# Patient Record
Sex: Female | Born: 1965
Health system: Southern US, Community
[De-identification: ages and names within clinical notes are randomized; demographics above are authoritative.]

## PROBLEM LIST (undated history)

## (undated) ENCOUNTER — Ambulatory Visit: Discharge status: Absent without leave | Payer: PPO

## (undated) DIAGNOSIS — R209 Unspecified disturbances of skin sensation: Secondary | ICD-10-CM

## (undated) DIAGNOSIS — D649 Anemia, unspecified: Secondary | ICD-10-CM

## (undated) DIAGNOSIS — G35 Multiple sclerosis: Secondary | ICD-10-CM

## (undated) DIAGNOSIS — K219 Gastro-esophageal reflux disease without esophagitis: Secondary | ICD-10-CM

## (undated) DIAGNOSIS — R569 Unspecified convulsions: Secondary | ICD-10-CM

## (undated) DIAGNOSIS — I639 Cerebral infarction, unspecified: Secondary | ICD-10-CM

## (undated) DIAGNOSIS — R51 Headache: Secondary | ICD-10-CM

## (undated) DIAGNOSIS — G35D Multiple sclerosis, unspecified: Secondary | ICD-10-CM

## (undated) DIAGNOSIS — I10 Essential (primary) hypertension: Secondary | ICD-10-CM

## (undated) HISTORY — DX: Cerebral infarction, unspecified: I63.9

## (undated) HISTORY — DX: Essential (primary) hypertension: I10

## (undated) HISTORY — DX: Unspecified disturbances of skin sensation: R20.9

## (undated) HISTORY — PX: TONSILLECTOMY: SHX5217

## (undated) HISTORY — PX: OTHER SURGICAL HISTORY: SHX169

## (undated) HISTORY — PX: EYE SURGERY: SHX253

## (undated) HISTORY — DX: Multiple sclerosis: G35

## (undated) HISTORY — DX: Unspecified convulsions: R56.9

## (undated) HISTORY — PX: BREAST BIOPSY: SHX20

## (undated) HISTORY — PX: ANAL INTRAEPITHELIAL NEOPLASIA EXCISION: SHX5241

## (undated) HISTORY — DX: Headache: R51

## (undated) HISTORY — DX: Multiple sclerosis, unspecified: G35.D

## (undated) HISTORY — DX: Gastro-esophageal reflux disease without esophagitis: K21.9

---

## 1999-09-01 ENCOUNTER — Encounter: Payer: Self-pay | Admitting: Emergency Medicine

## 1999-09-01 ENCOUNTER — Emergency Department (HOSPITAL_COMMUNITY): Admission: EM | Admit: 1999-09-01 | Discharge: 1999-09-01 | Payer: Self-pay | Admitting: Emergency Medicine

## 2000-10-24 ENCOUNTER — Emergency Department (HOSPITAL_COMMUNITY): Admission: EM | Admit: 2000-10-24 | Discharge: 2000-10-24 | Payer: Self-pay | Admitting: Emergency Medicine

## 2000-11-05 ENCOUNTER — Encounter (INDEPENDENT_AMBULATORY_CARE_PROVIDER_SITE_OTHER): Payer: Self-pay

## 2000-11-05 ENCOUNTER — Inpatient Hospital Stay (HOSPITAL_COMMUNITY): Admission: RE | Admit: 2000-11-05 | Discharge: 2000-11-07 | Payer: Self-pay | Admitting: Obstetrics & Gynecology

## 2002-03-13 ENCOUNTER — Other Ambulatory Visit: Admission: RE | Admit: 2002-03-13 | Discharge: 2002-03-13 | Payer: Self-pay | Admitting: Obstetrics & Gynecology

## 2002-07-18 ENCOUNTER — Observation Stay (HOSPITAL_COMMUNITY): Admission: AD | Admit: 2002-07-18 | Discharge: 2002-07-19 | Payer: Self-pay | Admitting: Obstetrics and Gynecology

## 2003-08-08 ENCOUNTER — Other Ambulatory Visit: Admission: RE | Admit: 2003-08-08 | Discharge: 2003-08-08 | Payer: Self-pay | Admitting: Obstetrics & Gynecology

## 2004-05-20 ENCOUNTER — Ambulatory Visit: Payer: Self-pay | Admitting: Internal Medicine

## 2004-10-15 ENCOUNTER — Other Ambulatory Visit: Admission: RE | Admit: 2004-10-15 | Discharge: 2004-10-15 | Payer: Self-pay | Admitting: Obstetrics & Gynecology

## 2006-06-21 ENCOUNTER — Ambulatory Visit: Payer: Self-pay | Admitting: Internal Medicine

## 2006-09-24 ENCOUNTER — Ambulatory Visit: Payer: Self-pay | Admitting: Internal Medicine

## 2006-09-24 LAB — CONVERTED CEMR LAB
AST: 17 units/L (ref 0–37)
Alkaline Phosphatase: 68 units/L (ref 39–117)
Bilirubin, Direct: 0.1 mg/dL (ref 0.0–0.3)
Eosinophils Absolute: 0.2 10*3/uL (ref 0.0–0.7)
Glucose, Bld: 94 mg/dL (ref 70–99)
Indirect Bilirubin: 0.3 mg/dL (ref 0.0–0.9)
Lymphs Abs: 2.6 10*3/uL (ref 0.7–3.3)
MCV: 78.7 fL (ref 78.0–100.0)
Neutrophils Relative %: 57 % (ref 43–77)
Platelets: 252 10*3/uL (ref 150–400)
Potassium: 4.1 meq/L (ref 3.5–5.3)
Sodium: 139 meq/L (ref 135–145)
Total Bilirubin: 0.4 mg/dL (ref 0.3–1.2)
WBC: 7.8 10*3/uL (ref 4.0–10.5)

## 2006-10-26 ENCOUNTER — Ambulatory Visit: Payer: Self-pay | Admitting: Internal Medicine

## 2006-12-29 ENCOUNTER — Telehealth (INDEPENDENT_AMBULATORY_CARE_PROVIDER_SITE_OTHER): Payer: Self-pay | Admitting: *Deleted

## 2006-12-30 ENCOUNTER — Ambulatory Visit: Payer: Self-pay | Admitting: Internal Medicine

## 2006-12-30 ENCOUNTER — Ambulatory Visit (HOSPITAL_COMMUNITY): Admission: RE | Admit: 2006-12-30 | Discharge: 2006-12-30 | Payer: Self-pay | Admitting: Internal Medicine

## 2006-12-30 DIAGNOSIS — R209 Unspecified disturbances of skin sensation: Secondary | ICD-10-CM

## 2006-12-30 DIAGNOSIS — K219 Gastro-esophageal reflux disease without esophagitis: Secondary | ICD-10-CM

## 2006-12-30 HISTORY — DX: Unspecified disturbances of skin sensation: R20.9

## 2006-12-30 HISTORY — DX: Gastro-esophageal reflux disease without esophagitis: K21.9

## 2006-12-31 ENCOUNTER — Inpatient Hospital Stay (HOSPITAL_COMMUNITY): Admission: EM | Admit: 2006-12-31 | Discharge: 2007-01-03 | Payer: Self-pay | Admitting: Emergency Medicine

## 2007-01-03 ENCOUNTER — Ambulatory Visit: Payer: Self-pay | Admitting: Internal Medicine

## 2007-01-07 ENCOUNTER — Encounter: Payer: Self-pay | Admitting: Internal Medicine

## 2007-01-07 ENCOUNTER — Telehealth (INDEPENDENT_AMBULATORY_CARE_PROVIDER_SITE_OTHER): Payer: Self-pay | Admitting: *Deleted

## 2007-01-12 ENCOUNTER — Telehealth (INDEPENDENT_AMBULATORY_CARE_PROVIDER_SITE_OTHER): Payer: Self-pay | Admitting: *Deleted

## 2007-02-14 ENCOUNTER — Ambulatory Visit: Payer: Self-pay | Admitting: Internal Medicine

## 2007-06-06 ENCOUNTER — Telehealth (INDEPENDENT_AMBULATORY_CARE_PROVIDER_SITE_OTHER): Payer: Self-pay | Admitting: *Deleted

## 2007-07-29 ENCOUNTER — Telehealth: Payer: Self-pay | Admitting: Internal Medicine

## 2007-10-13 ENCOUNTER — Encounter: Payer: Self-pay | Admitting: Internal Medicine

## 2007-11-28 ENCOUNTER — Encounter: Payer: Self-pay | Admitting: Internal Medicine

## 2008-09-13 ENCOUNTER — Ambulatory Visit: Payer: Self-pay | Admitting: Internal Medicine

## 2008-09-14 ENCOUNTER — Ambulatory Visit: Payer: Self-pay | Admitting: Family Medicine

## 2008-09-25 ENCOUNTER — Ambulatory Visit: Payer: Self-pay | Admitting: Internal Medicine

## 2008-11-02 ENCOUNTER — Encounter: Admission: RE | Admit: 2008-11-02 | Discharge: 2008-11-02 | Payer: Self-pay | Admitting: Obstetrics & Gynecology

## 2009-05-03 ENCOUNTER — Encounter: Admission: RE | Admit: 2009-05-03 | Discharge: 2009-05-03 | Payer: Self-pay | Admitting: Obstetrics & Gynecology

## 2009-09-14 ENCOUNTER — Emergency Department (HOSPITAL_COMMUNITY): Admission: EM | Admit: 2009-09-14 | Discharge: 2009-09-15 | Payer: Self-pay

## 2009-11-08 ENCOUNTER — Encounter: Admission: RE | Admit: 2009-11-08 | Discharge: 2009-11-08 | Payer: Self-pay | Admitting: Obstetrics & Gynecology

## 2009-11-13 ENCOUNTER — Encounter: Admission: RE | Admit: 2009-11-13 | Discharge: 2009-11-13 | Payer: Self-pay | Admitting: Obstetrics & Gynecology

## 2010-02-14 ENCOUNTER — Ambulatory Visit: Payer: Self-pay | Admitting: Internal Medicine

## 2010-06-08 ENCOUNTER — Encounter: Payer: Self-pay | Admitting: Internal Medicine

## 2010-06-17 NOTE — Assessment & Plan Note (Signed)
Summary: acid reflux//ccm   Vital Signs:  Patient profile:   45 year old female Weight:      229 pounds Temp:     98.3 degrees F oral BP sitting:   130 / 80  (right arm) Cuff size:   regular  Vitals Entered By: Duard Brady LPN (February 14, 2010 11:27 AM) CC: c/o gerd sx getting worse       **declines flu vaccine Is Patient Diabetic? No   CC:  c/o gerd sx getting worse       **declines flu vaccine.  History of Present Illness:  45 year old patient who has a history of MS.  She also has a history of gastroesophageal reflux disease and for the past two months has had worsening nocturnal reflux symptoms.  She states that the symptoms have been well controlled in the past with Nexium. She has a history of MS, which has been fairly stable.  She remains on weekly interferon injections  Allergies: 1)  ! Sulf-10 2)  ! Penicillin G Potassium (Penicillin G Potassium)  Past History:  Past Medical History: Reviewed history from 09/13/2008 and no changes required. GERD multiple sclerosis  Review of Systems       The patient complains of severe indigestion/heartburn.  The patient denies anorexia, fever, weight loss, weight gain, vision loss, decreased hearing, hoarseness, chest pain, syncope, dyspnea on exertion, peripheral edema, prolonged cough, headaches, hemoptysis, abdominal pain, melena, hematochezia, hematuria, incontinence, genital sores, muscle weakness, suspicious skin lesions, transient blindness, difficulty walking, depression, unusual weight change, abnormal bleeding, enlarged lymph nodes, angioedema, and breast masses.    Physical Exam  General:  overweight-appearing.  normal blood pressureoverweight-appearing.   Head:  Normocephalic and atraumatic without obvious abnormalities. No apparent alopecia or balding. Eyes:  No corneal or conjunctival inflammation noted. EOMI. Perrla. Funduscopic exam benign, without hemorrhages, exudates or papilledema. Vision grossly  normal. Mouth:  Oral mucosa and oropharynx without lesions or exudates.  Teeth in good repair. Neck:  No deformities, masses, or tenderness noted. Lungs:  Normal respiratory effort, chest expands symmetrically. Lungs are clear to auscultation, no crackles or wheezes. Heart:  Normal rate and regular rhythm. S1 and S2 normal without gallop, murmur, click, rub or other extra sounds. Abdomen:  Bowel sounds positive,abdomen soft and non-tender without masses, organomegaly or hernias noted.   Impression & Recommendations:  Problem # 1:  GERD (ICD-530.81)  Her updated medication list for this problem includes:    Nexium 40 Mg Cpdr (Esomeprazole magnesium) ..... One daily  Her updated medication list for this problem includes:    Nexium 40 Mg Cpdr (Esomeprazole magnesium) ..... One daily  Complete Medication List: 1)  Miralax Powd (Polyethylene glycol 3350) .... Two times a day until results then daily prn 2)  Avonex 30 Mcg/vial Kit (Interferon beta-1a) .Marland Kitchen.. 1 q week 3)  Nexium 40 Mg Cpdr (Esomeprazole magnesium) .... One daily  Patient Instructions: 1)  Limit your Sodium (Salt). 2)  Avoid foods high in acid (tomatoes, citrus juices, spicy foods). Avoid eating within two hours of lying down or before exercising. Do not over eat; try smaller more frequent meals. Elevate head of bed twelve inches when sleeping. 3)  It is important that you exercise regularly at least 20 minutes 5 times a week. If you develop chest pain, have severe difficulty breathing, or feel very tired , stop exercising immediately and seek medical attention. 4)  You need to lose weight. Consider a lower calorie diet and regular exercise.  Prescriptions: NEXIUM 40  MG CPDR (ESOMEPRAZOLE MAGNESIUM) one daily  #90 x 3   Entered and Authorized by:   Gordy Savers  MD   Signed by:   Gordy Savers  MD on 02/14/2010   Method used:   Print then Give to Patient   RxID:   [id number]

## 2010-08-11 ENCOUNTER — Encounter: Payer: Self-pay | Admitting: Internal Medicine

## 2010-08-12 ENCOUNTER — Ambulatory Visit (INDEPENDENT_AMBULATORY_CARE_PROVIDER_SITE_OTHER): Payer: Managed Care, Other (non HMO) | Admitting: Internal Medicine

## 2010-08-12 ENCOUNTER — Encounter: Payer: Self-pay | Admitting: Internal Medicine

## 2010-08-12 VITALS — BP 110/88 | Temp 98.7°F | Wt 234.0 lb

## 2010-08-12 DIAGNOSIS — G35 Multiple sclerosis: Secondary | ICD-10-CM | POA: Insufficient documentation

## 2010-08-12 LAB — HEPATIC FUNCTION PANEL
ALT: 19 U/L (ref 0–35)
Albumin: 3.3 g/dL — ABNORMAL LOW (ref 3.5–5.2)
Total Bilirubin: 0.2 mg/dL — ABNORMAL LOW (ref 0.3–1.2)

## 2010-08-12 LAB — CBC WITH DIFFERENTIAL/PLATELET
Eosinophils Absolute: 0.1 10*3/uL (ref 0.0–0.7)
MCHC: 32.8 g/dL (ref 30.0–36.0)
MCV: 80 fl (ref 78.0–100.0)
Monocytes Absolute: 0.8 10*3/uL (ref 0.1–1.0)
Neutrophils Relative %: 66.3 % (ref 43.0–77.0)
Platelets: 261 10*3/uL (ref 150.0–400.0)

## 2010-08-12 LAB — BASIC METABOLIC PANEL
BUN: 14 mg/dL (ref 6–23)
CO2: 29 mEq/L (ref 19–32)
Chloride: 106 mEq/L (ref 96–112)
Creatinine, Ser: 1 mg/dL (ref 0.4–1.2)
Glucose, Bld: 73 mg/dL (ref 70–99)

## 2010-08-12 LAB — TSH: TSH: 2.84 u[IU]/mL (ref 0.35–5.50)

## 2010-08-12 NOTE — Patient Instructions (Signed)
Limit your sodium (Salt) intake    It is important that you exercise regularly, at least 20 minutes 3 to 4 times per week.  If you develop chest pain or shortness of breath seek  medical attention.  You need to lose weight.  Consider a lower calorie diet and regular exercise.  Avoids foods high in acid such as tomatoes citrus juices, and spicy foods.  Avoid eating within two hours of lying down or before exercising.  Do not overheat.  Try smaller more frequent meals.  If symptoms persist, elevate the head of her bed 12 inches while sleeping.  Call or return to clinic prn if these symptoms worsen or fail to improve as anticipated.   followup neurology

## 2010-08-12 NOTE — Progress Notes (Signed)
  Subjective:    Patient ID: [name], female    DOB: 02/18/1966, 45 y.o.   MRN: [id number]  HPI   45 year old patient who is followed closely by neurology due to MAS. This was initially diagnosed in August of 2008. She has multiple complaints today including fatigue weight gain unsteady gait and some numbness of her lower extremities. Does also have some mild low back pain and worsening GERD symptoms. She is on chronic Nexium. She is on interferon and more recently has been on Solu-Medrol. She was evaluated by gynecology recently but no laboratory studies were performed.    Review of Systems  Constitutional: Positive for unexpected weight change.  HENT: Negative for hearing loss, congestion, sore throat, rhinorrhea, dental problem, sinus pressure and tinnitus.   Eyes: Negative for pain, discharge and visual disturbance.  Respiratory: Negative for cough and shortness of breath.   Cardiovascular: Positive for palpitations. Negative for chest pain and leg swelling.  Gastrointestinal: Negative for nausea, vomiting, abdominal pain, diarrhea, constipation, blood in stool and abdominal distention.  Genitourinary: Negative for dysuria, urgency, frequency, hematuria, flank pain, vaginal bleeding, vaginal discharge, difficulty urinating, vaginal pain and pelvic pain.  Musculoskeletal: Positive for back pain. Negative for joint swelling, arthralgias and gait problem.  Skin: Negative for rash.  Neurological: Positive for weakness, light-headedness and numbness. Negative for dizziness, syncope, speech difficulty and headaches.  Hematological: Negative for adenopathy.  Psychiatric/Behavioral: Negative for behavioral problems, dysphoric mood and agitation. The patient is not nervous/anxious.        Objective:   Physical Exam        Assessment & Plan:   multiple sclerosis. Continue present regimen and follow up with neurology  Weight gain fatigue. May be manifestations of her MS and/or  treatment. We'll check some screening labs

## 2010-08-25 ENCOUNTER — Telehealth: Payer: Self-pay | Admitting: Internal Medicine

## 2010-08-25 NOTE — Telephone Encounter (Signed)
Please advise 

## 2010-08-25 NOTE — Telephone Encounter (Signed)
Pt need labwork results

## 2010-08-25 NOTE — Telephone Encounter (Signed)
Lab ok except for mild stable anemia

## 2010-08-25 NOTE — Telephone Encounter (Signed)
Spoke with pt - informed of labs. KIK

## 2010-08-26 ENCOUNTER — Encounter: Payer: Self-pay | Admitting: Internal Medicine

## 2010-08-26 ENCOUNTER — Ambulatory Visit (INDEPENDENT_AMBULATORY_CARE_PROVIDER_SITE_OTHER): Payer: Managed Care, Other (non HMO) | Admitting: Internal Medicine

## 2010-08-26 VITALS — BP 110/70 | Temp 98.9°F | Wt 226.0 lb

## 2010-08-26 DIAGNOSIS — L299 Pruritus, unspecified: Secondary | ICD-10-CM

## 2010-08-26 MED ORDER — HYDROXYZINE HCL 10 MG PO TABS: 10.0000 mg | ORAL_TABLET | Freq: Three times a day (TID) | ORAL | Status: AC | PRN

## 2010-08-26 MED ORDER — PREDNISONE 10 MG PO TABS: 10.0000 mg | ORAL_TABLET | Freq: Every day | ORAL | Status: AC

## 2010-08-26 NOTE — Patient Instructions (Signed)
Call or return to clinic prn if these symptoms worsen or fail to improve as anticipated.

## 2010-08-26 NOTE — Progress Notes (Signed)
  Subjective:    Patient ID: [name], female    DOB: 21-Aug-1965, 45 y.o.   MRN: [id number]  HPI 45 year old patient who has a history of multiple sclerosis. She complains of generalized pruritus after sending the weekend outdoors in a park. There's been no rash. She has been using Benadryl with only marginal benefit. There does not appear to any new irritants such as change in soap laundry detergent fabric softener etc.   Review of Systems  Skin: Negative for rash.       Objective:   Physical Exam  Constitutional: She appears well-developed and well-nourished. No distress.       Overweight. No distress. Normal blood pressure.  Skin: No rash noted.          Assessment & Plan:  Generalized pruritus. Prominent secondary to exposure to outdoor allergens. We'll treat with Atarax and short course of low-dose steroids

## 2010-09-30 NOTE — Consult Note (Signed)
[name], [name]              ACCOUNT NO.:  [id number]   MEDICAL RECORD NO.:  [id number]          PATIENT TYPE:  INP   LOCATION:  5022                         FACILITY:  [facility]   PHYSICIAN:  Casimiro Needle L. Reynolds, M.D.DATE OF BIRTH:  November 15, 1965   DATE OF CONSULTATION:  12/31/2006  DATE OF DISCHARGE:                                 CONSULTATION   INPATIENT CONSULTATION:   REQUESTING PHYSICIAN:  Eleonore Chiquito, MD.   REASON FOR EVALUATION:  Lower extremity numbness, suspected myelitis.   HPI:  This is the initial inpatient consultation evaluation of this 45-  year-old woman with little significant past medical history except as  outlined below.  The patient reports that 4 days ago she noticed  numbness in her left foot.  Over the next couple of days, this gradually  ascended up her leg, and she also noticed some numbness in the right  foot and right leg as well.  Two days ago she noticed the numbness had  come all the way up into her stomach area, also a little bit involving  the ulnar aspects of both hands.  There was no associated weakness,  alteration in gait, or difficulty with bowel or bladder function.  She  was actually able to continue working during these few days.  Yesterday  she saw her primary physician who ordered an MRI of the cervical spine  which was done last night.  After an abnormality was seen there, she was  advised to come to the emergency room for admission and has been  admitted to the [facility] inpatient service with neurologic consultation.  At present, she is awake and alert.  She is anxious but denies any pain.  She states that other than this she is feeling well and denies ever  having anything like this happen to her before.   PAST MEDICAL HISTORY:  She has a questionable history of hypertension  for which she is on lisinopril.  She had HELLP syndrome during  pregnancy.  She states that approximately 17 or 18 years ago she had an  episode where she  developed difficulty walking which lasted a few days.  She was seen at an urgent care, and the concern was that she might have  had a stroke.  However, she did not come to the hospital.  She states  that she walked with a limp for a few days, and eventually this  rectified itself and she had no further difficulties.  She did not have  a workup for this.  She states that about 6 to 7 years ago while on a  trip to [place] she had some twitching of my body, which was transient  and resolved.  It was not associated with focal neurologic deficits.  She denies ever having any transient monocular visual loss.   PREVIOUS SURGERIES:  Include C-section, myomectomy.   FAMILY/SOCIAL/REVIEW OF SYSTEMS:  As outlined in admission H&P which is  reviewed.  She denies any history of head injuries, history of tobacco  or alchohol use.  She does report a longstanding problem with  constipation and occasional urinary hesitation  but nothing very severe.   MEDICATIONS:  She uses Nexium p.r.n.  In the hospital, she is also  receiving lisinopril, Lovenox, and Tylenol p.r.n.   PHYSICAL EXAMINATION:  VITAL SIGNS:  Temperature 99.9, blood pressure  146/93, pulse 111, respirations 20, O2 sat 100% on room air.  GENERAL EXAMINATION:  This is an anxious-appearing but otherwise healthy  woman seated and no evident distress.  HEENT:  Head:  Cranium is normocephalic and atraumatic.  Oropharynx  benign.  NECK:  Supple without carotid or supraclavicular bruits.  She does not  have a true Lhermitte's sign, but does report a numbness sensation in  the feet with neck flexion.  HEART:  Tachycardiac, regular rhythm, no murmurs.  NEUROLOGIC EXAMINATION:  Mental status:  She is awake and alert.  She is  fully oriented to time and place.  Recent and remote memory are intact.  Attention span, concentration, fund of knowledge are all appropriate.  Speech is fluent and not dysarthric.  She has no defects to  confrontational naming  and can repeat phrases.  Mood is euthymic and  affect appropriate.  Cranial nerves:  Pupils are equal, brisk, and  reactive.  Extraocular movements full without nystagmus.  Visual fields  full with confrontation.  Hearing intact to finger rub.  Face, tongue,  and palate move normally and symmetric.  Motor:  Normal bulk and tone.  Normal strength in all tested extremity muscles.  Sensation intact on  light touch and proprioception in all extremities.  Coordination:  Rapid  movements are performed accurately.  Finger-to-nose and heel-to-shin are  performed accurately.  Gait:  She arises easily from a chair and her  stance is normal, stable to toe and tandem walk without significant  difficulty.  Reflexes 2+ and symmetric.  Toes are downgoing bilaterally.   LABORATORY REVIEW:  CBC:  White count 9.3, hemoglobin 12.6, platelets  257,000.  BMET is unremarkable.  MRI of the cervical spine performed  last night is personally reviewed.  This study demonstrates swelling and  edema of the spinal cord, particularly in the posterior columns, from C2  to C4.  There is spine enhancement in this area.  The cord is slightly  enlarged.  There is no significant disk disease or compression seen.  The rest of the cord looks unremarkable.  MRI of the brain performed  during this day is reviewed.  A preliminary report is also reviewed.  This study demonstrates 1 white matter lesion in the left parieto-  occipital white matter as well as a diffuse haziness to the white  matter.  No acute findings are demonstrated.   IMPRESSION:  Cervical myelitis.  This is probably secondary to multiple  sclerosis, although other causes should be excluded.  She has,  surprisingly, little on her examination to show for this at this time,  but it should be treated aggressively to make sure it does not worsen.   RECOMMENDATIONS:  We will check labs to exclude entities in the  differential diagnosis which would include lupus,  sarcoid, HIV and HTLV-  1 infection.  Treat with Solu-Medrol 1 g IV daily for the next 3 days.  After that she can go on a prednisone taper.  If lab work is otherwise  negative, she most likely should be treated for multiple sclerosis, but  this treatment can be initiated on an outpatient basis.   We will follow with you.  Thank you for the consultation.      Michael L. Thad Ranger, M.D.  Electronically Signed     MLR/MEDQ  D:  12/31/2006  T:  01/01/2007  Job:  [id number]

## 2010-09-30 NOTE — Discharge Summary (Signed)
[name], [name]              ACCOUNT NO.:  [id number]   MEDICAL RECORD NO.:  [id number]          PATIENT TYPE:  INP   LOCATION:  5022                         FACILITY:  [facility]   PHYSICIAN:  Valerie A. Felicity Coyer, MDDATE OF BIRTH:  Dec 02, 1965   DATE OF ADMISSION:  12/31/2006  DATE OF DISCHARGE:  01/03/2007                               DISCHARGE SUMMARY   DISCHARGE DIAGNOSES:  1. Paresthesias with bilateral leg and trunk numbness, evidence of      transverse myelitis versus multiple sclerosis, improved on steroid      therapy.  See below.  Outpatient followup in 2 weeks.  Continue      p.o. prednisone taper.  2. History of bowel hemolysis, elevated liver enzymes and low platelet      count syndrome during pregnancy.  3. Hypertension.  Continue home lisinopril.   DISCHARGE MEDICATIONS:  1. Prednisone taper 60 mg daily x3 days, then 50 mg daily x3 days,      then 40 mg daily x3 days, then 30 mg daily x3 days, then 20 mg      daily x3 days, then 10 mg daily x3 days, then stop.  2. Lisinopril 10 mg daily.  3. Nexium 40 mg daily p.r.n.   DISPOSITION:  The patient is discharged home in medically stable and  improved condition.  She at this time is having only numbness in her  hands and feet bilaterally which is improved.  She has no motor  deficits, no difficulty with ambulation or balance, and feels stable for  discharge home. Reviewed plans for prednisone taper and outpatient  neurology followup in 2 weeks with the patient and family.   CONSULTATIONS THIS HOSPITALIZATION:  Include neurology, Dr. Kelli Hope. Hospital followup is with neurologist, Dr. Kelli Hope.  Family to call for appointment in 2 weeks, sooner if problems.  Also  primary care physician, Dr. Derryl Harbor in 4-6 weeks or as needed  for otherwise routine followup of other medical issues.   CONDITION ON DISCHARGE:  Medically improved.   HOSPITAL COURSE:  CERVICAL TRANSVERSE MYELITIS VERSUS NEW  DIAGNOSIS MULTIPLE SCLEROSIS:  The patient is a pleasant 45 year old woman who came to the emergency  room due to lower extremity numbness for the last 4 days.  She had been  seen by her primary care physician for evaluation of same who ordered  MRI of the neck for further evaluation. This showed cervical edema  consistent with transverse myelitis verses MS and was contacted by her  primary care physician's office to come to the emergency room for  further evaluation.  She was admitted for neurology consultation who  felt that treatment with high-dose IV steroids as well as evaluation for  myelitis verses MS would be appropriate. Please see initial consultation  by Dr. Kelli Hope as per dictation. The laboratory tests ordered  were checked, and at this time most of the returns have been negative  and include a negative ANA, normal ACE level, nonreactive HIV, sed rate  of only 20, and a normal B12, CBC, and basic metabolic.   At this time, the  patient's symptoms have improved, and she has been  told to expect a waxing and waning course. Other followup on pending  labs will be per neurology, and she is to follow up with them in the  next two weeks. She has been given a prednisone taper as described above  per recommendations of neurology and is anxious for discharge home.   Other problems are as prior to admission, and she is to call neurology  or primary care physician as needed for problems prior to that time.      Valerie A. Felicity Coyer, MD  Electronically Signed     VAL/MEDQ  D:  01/03/2007  T:  01/03/2007  Job:  [id number]

## 2010-10-03 NOTE — H&P (Signed)
[facility]  Patient:    [name], [name]                       MRN: [id number] Adm. Date:  11/05/00 Attending:  Caralyn Guile. Arlyce Dice, M.D.                         History and Physical  CHIEF COMPLAINT:              Myoma uteri.  HISTORY OF PRESENT ILLNESS:   This is a 45 year old, gravida 1, AB 1, who was seen in the office with a history of rapidly enlarging uterine fibroid.  The patient had a physical exam one year ago in which no mention of an enlarged uterus was made.  The patient presented to her family doctor in May and was found to have a large abdominal mass which on ultrasound was shown to be a uterine myoma.  This mass was approximately 20-weeks size.  In view of her negative physical exam a year prior, the assumption is that this is a fairly rapidly growing tumor.  PAST MEDICAL HISTORY:         Negative for surgery.  She has no medical problems.  She has never had a blood transfusion.  She has had all the normal childhood diseases.  ALLERGIES:                    PENICILLIN and SULFA.  FAMILY HISTORY:               Positive for high blood pressure but negative for diabetes, heart attack or breast cancer.  SOCIAL HISTORY:               She does not smoke, does not drink alcohol.  She denies the use of IV drugs or addicting recreational drugs.  REVIEW OF SYSTEMS:            Negative in detail.  PHYSICAL EXAMINATION:  VITAL SIGNS:                  She is afebrile.  Her pulse is 80 and regular. Her blood pressure is 100/80.  HEENT:                        Normal.  NECK:                         Supple without thyromegaly.  BREASTS:                      Without masses.  HEART:                        Regular sinus rhythm without murmur or gallop.  LUNGS:                        Clear.  ABDOMEN:                      Soft.  There was a large pelvic mass reaching to the umbilicus.  EXTREMITIES:                   Normal.  LYMPHADENOPATHY:              None.  PELVIC:  External genitalia and vagina normal.  Cervix normal.  The uterus was quite enlarged with a large posterior myoma noted along with other palpable myomas.  LABORATORY DATA:              Hemoglobin 13.1.  ADMISSION DIAGNOSES:          Large uterine myoma.  PLAN:                         The options were discussed with the patient who is very anxious to preserve her fertility; therefore, a uterine myomectomy will be performed. DD:  11/04/00 TD:  11/04/00 Job: [id number]

## 2010-10-03 NOTE — Discharge Summary (Signed)
[facility]  Patient:    [name], [name]                     MRN: [id number] Adm. Date:  [id number] Disc. Date: [id number] Attending:  Lars Pinks                           Discharge Summary  FINAL DIAGNOSIS:              Myoma uteri.  SECONDARY DIAGNOSIS:          None.  PROCEDURE:                    Abdominal myomectomy.  COMPLICATIONS:                None.  CONDITION ON DISCHARGE:       Improved.  HISTORY:                      This is a 45 year old gravida 1, Ab 1, who was presented to the office with a history of rapidly enlarging uterine myoma. The patient had a physical exam one year prior to admission where no mention of myoma were made. The size of the myoma was approximately the size of a 20-week uterus, and the patient was counseled to have myomectomy. The patient was brought to the hospital on June 21 where an abdominal myomectomy was performed by Dr. Ilda Mori without complication. The patients postoperative course was benign without significant fever or anemia. On the second postoperative day, the patient was felt to be ready for discharge. She was discharged on a regular diet, told to limit her activity.  DISCHARGE MEDICATIONS:        1. Percocet one to two every four hours for                                  pain.                               2. Ibuprofen 600 mg q.i.d. for pain.                               3. Iron sulfate.  DISCHARGE FOLLOWUP:           The patient was asked to call Dr. Arlyce Dice for followup evaluation in four weeks.  LABORATORY DATA:              Admission hemoglobin was 13.1 with a white count of 5600. On discharge, her hemoglobin was 9.6 with a white count of 6800. Routine chemistries were essentially within normal limits. Her total protein was slightly elevated at 8.8, her potassium was slightly decreased at 3.3, and her sodium was slightly increased at 146. Her ALT and bilirubin were  normal.  PATHOLOGY REPORT:             Four nodular masses ranging from 1 cm to 12 cm in size with a combined weight of 720 g. The masses were all read as benign leiomyomata.  DD:  12/13/00 TD:  12/13/00 Job: [id number]

## 2010-10-03 NOTE — Op Note (Signed)
[facility]  Patient:    [name], [name]                     MRN: [id number] Proc. Date: 11/05/00 Adm. Date:  [id number] Attending:  Lars Pinks CC:         Gordy Savers, M.D.   Operative Report  PREOPERATIVE DIAGNOSIS:       Rapidly growing uterine myoma.  POSTOPERATIVE DIAGNOSIS:      Rapidly growing uterine myoma.  PROCEDURE:                    Abdominal myomectomy.  SURGEON:                      Richard D. Arlyce Dice, M.D.  ASSISTANT:                    Marcelle Overlie, M.D.  ANESTHESIA:                   General endotracheal.  ESTIMATED BLOOD LOSS:         200 cc.  FINDINGS:                     Four uterine myoma, one was around 7-8 cm on the fundus, another was 7-8 cm in the lower uterine segment near the cervix and posterior, a third was only 2 cm in the fundus, and the fourth was a small ______ myoma near the left cornua. The tubes and ovaries were perfectly normal. The rest of the abdominal cavity was normal.  INDICATIONS:                  This was a 45 year old, gravida 1, Ab 1, who was found to have a large uterine myoma on physical exam and this was confirmed with ultrasound. Previously, the patient had never been told she had myomas and her previous pelvic exam was less than a year prior to the discovery of her present myoma. The patient was very concerned about preserving her fertility and a decision was made to proceed with myomectomy.  DESCRIPTION OF PROCEDURE:            The patient was taken to the operating room and placed in the supine position and general endotracheal anesthesia was induced. A pediatric Foley was introduced through the cervix into the endometrial cavity and methylene blue dye was then attached for future instillation, identification of the endometrial cavity, and the patency of the fallopian tubes. The surgeon then regowned and gloved. The abdomen and perineum had been draped in sterile  fashion. A low transverse incision was made and carried down to the fascia which was extended transversely. The rectus muscle was divided in the midline and the peritoneum was entered sharply and extended vertically. The OConnor-OSullivan self-retaining retractor was placed. The fibroid uterus was then delivered through the incision. Pitressin 10 units/10 cc was then injected into the fundal myoma and the myoma was then dissected out by sharp and blunt dissection. The base of the myoma was closed with interlocking 3-0 Vicryl suture and the serosal capsule was closed with a Connell stitch to minimize adhesions. The identical procedure was then performed with a large posterior myoma, and this was also dissected free by sharp and blunt dissection and the base was closed with 3-0 Vicryl and the serosa was closed with a Connell stitch. The 2 cm myoma was dissected through  the same incision as the posterior myoma and the small ______  myoma was cauterized and removed in that fashion. At the end of the procedure, the uterus was back to normal size and the tubes and ovaries appeared perfectly normal. Methylene blue dye was then instilled through the indwelling pediatric Foley and both tubes spilled promptly with dye. During the dissection, the endometrial cavity was not approached and was felt that she was an excellent candidate for vaginal delivery. To minimize the risk of adhesion formation, Interceed gel was then placed over the uterine incisions where the myoma had been removed. At this point, the peritoneal cavity was closed with a running 0 Vicryl suture, the fascia was closed with running 0 Vicryl suture, and the skin was closed with subcuticular 4-0 Monocryl suture. The patient tolerated the procedure well and left the operating room in good condition. DD:  11/05/00 TD:  11/05/00 Job: [id number]

## 2011-02-27 LAB — HIV ANTIBODY (ROUTINE TESTING W REFLEX): HIV: NONREACTIVE

## 2011-02-27 LAB — BASIC METABOLIC PANEL
CO2: 21
Calcium: 8.8
Chloride: 108
GFR calc Af Amer: >60
Sodium: 137

## 2011-02-27 LAB — CBC
Hemoglobin: 12.6
MCHC: 32.3
RBC: 4.77
WBC: 9.3

## 2011-02-27 LAB — DIFFERENTIAL
Basophils Relative: 1
Lymphs Abs: 1.1
Monocytes Absolute: 0.4
Monocytes Relative: 4
Neutro Abs: 7.7

## 2011-02-27 LAB — VITAMIN B12: Vitamin B-12: 618 (ref 211–911)

## 2011-02-27 LAB — SEDIMENTATION RATE: Sed Rate: 20

## 2011-02-27 LAB — HTLV I+II ANTIBODIES, (EIA), BLD: HTLV I/II Ab: NEGATIVE

## 2011-02-27 LAB — ANA: Anti Nuclear Antibody(ANA): NEGATIVE

## 2011-06-19 ENCOUNTER — Other Ambulatory Visit: Payer: Self-pay | Admitting: Obstetrics & Gynecology

## 2011-06-24 ENCOUNTER — Other Ambulatory Visit: Payer: Self-pay | Admitting: Obstetrics & Gynecology

## 2011-06-24 DIAGNOSIS — N644 Mastodynia: Secondary | ICD-10-CM

## 2011-09-23 ENCOUNTER — Encounter (HOSPITAL_BASED_OUTPATIENT_CLINIC_OR_DEPARTMENT_OTHER): Payer: Self-pay | Admitting: *Deleted

## 2011-09-23 ENCOUNTER — Emergency Department (HOSPITAL_BASED_OUTPATIENT_CLINIC_OR_DEPARTMENT_OTHER)
Admission: EM | Admit: 2011-09-23 | Discharge: 2011-09-23 | Disposition: A | Discharge status: Home / Self Care | Payer: Managed Care, Other (non HMO) | Attending: Emergency Medicine | Admitting: Emergency Medicine

## 2011-09-23 DIAGNOSIS — Z79899 Other long term (current) drug therapy: Secondary | ICD-10-CM | POA: Insufficient documentation

## 2011-09-23 DIAGNOSIS — K219 Gastro-esophageal reflux disease without esophagitis: Secondary | ICD-10-CM | POA: Insufficient documentation

## 2011-09-23 DIAGNOSIS — G35 Multiple sclerosis: Secondary | ICD-10-CM | POA: Insufficient documentation

## 2011-09-23 DIAGNOSIS — R55 Syncope and collapse: Secondary | ICD-10-CM | POA: Insufficient documentation

## 2011-09-23 DIAGNOSIS — R42 Dizziness and giddiness: Secondary | ICD-10-CM | POA: Insufficient documentation

## 2011-09-23 HISTORY — DX: Anemia, unspecified: D64.9

## 2011-09-23 LAB — CBC
Hemoglobin: 10.1 g/dL — ABNORMAL LOW (ref 12.0–15.0)
MCH: 22.1 pg — ABNORMAL LOW (ref 26.0–34.0)
MCHC: 32.6 g/dL (ref 30.0–36.0)
Platelets: 303 10*3/uL (ref 150–400)
RDW: 18.7 % — ABNORMAL HIGH (ref 11.5–15.5)

## 2011-09-23 LAB — DIFFERENTIAL
Basophils Absolute: 0 10*3/uL (ref 0.0–0.1)
Eosinophils Absolute: 0.1 10*3/uL (ref 0.0–0.7)
Lymphocytes Relative: 31 % (ref 12–46)
Monocytes Absolute: 0.4 10*3/uL (ref 0.1–1.0)
Neutrophils Relative %: 60 % (ref 43–77)

## 2011-09-23 LAB — BASIC METABOLIC PANEL
BUN: 9 mg/dL (ref 6–23)
Creatinine, Ser: 0.7 mg/dL (ref 0.50–1.10)
GFR calc Af Amer: >90 mL/min (ref >90)
GFR calc non Af Amer: >90 mL/min (ref >90)
Glucose, Bld: 92 mg/dL (ref 70–99)

## 2011-09-23 MED ORDER — ONDANSETRON 4 MG PO TBDP: 4.0000 mg | ORAL_TABLET | Freq: Three times a day (TID) | ORAL | Status: AC | PRN

## 2011-09-23 MED ORDER — ONDANSETRON HCL 4 MG/2ML IJ SOLN
4.0000 mg | Freq: Once | INTRAMUSCULAR | Status: AC
  Administered 2011-09-23: 4 mg via INTRAVENOUS
  Filled 2011-09-23: qty 2

## 2011-09-23 NOTE — Discharge Instructions (Signed)
Multiple Sclerosis Multiple sclerosis (MS) is a disease of the central nervous system. Its cause is unknown. It is more common in the [place] states than in the [place] states. There is a higher incidence of MS in women. There is a wide variation in the symptoms (problems) of MS. This is because of the many different ways it affects the central nervous system. It often comes on in episodes or attacks. These attacks may last weeks to months. There may be long periods of nearly no problems between attacks. The main symptoms include visual problems (associated with eye pain), numbness, weakness, and paralysis in extremities (arms/hands and legs/feet). There may also be tremors and problems with balance and walking. The age when MS starts is variable. Advances in medicine continue to improve the treatment of this illness. There is no known cure for MS but there are medications that help. MS is not an inherited illness, although your risk of getting this disease is higher if you have a relative with MS. The best radiologic (x-ray) study for MS is an MRI (magnetic resonance imaging). There are medications available to decrease the number and frequency of attacks. SYMPTOMS  The symptoms of MS are caused by loss of insulation (myelin) of the nerves of the brain. When this happens, brain signals do not get transmitted properly or may not get transmitted at all. Some of the problems caused by this include:   Numbness.   Weakness.   Paralysis in extremities.   Visual problems, eye pain.   Balance problems.   Tremors.  DIAGNOSIS  Your caregiver can do studies on you to make this diagnosis. This may include specialized X-rays and spinal fluid studies. HOME CARE INSTRUCTIONS   Take medications as directed by your caregiver. Baclofen is a drug commonly used to reduce muscle spasticity. Steroids are often used for short term relief.   Exercise as directed.   Use physical and occupational therapy as  directed by your caregiver. Careful attention to this medical care can help avoid depression.   See your caregiver if you begin to have problems with depression. This is a common problem in MS. Patients often continue to work many years after the diagnosis of MS.  Document Released: 05/01/2000 Document Revised: 04/23/2011 Document Reviewed: 12/08/2006 [facility] Patient Information 2012 [place], [place].Near-Syncope Near-syncope is sudden weakness, dizziness, or feeling like you might pass out (faint). This can happen when getting up or while standing for a long time. It can be caused by a drop in blood pressure. It is common in people taking medicine for blood pressure. Fainting can happen when the blood pressure or pulse is too low. HOME CARE  If you feel like you are going to pass out:   Lie down right away.   Breathe deeply and steadily.   Move only when the feeling has gone away. Most of the time, this feeling lasts only a few minutes. You may feel tired for several hours.   Drink enough fluids to keep your pee (urine) clear or pale yellow.   If you are taking blood pressure or heart medicine, stand up slowly.  GET HELP RIGHT AWAY IF:   You have a severe headache.   Unusual pain develops in the chest, belly (abdomen), or back.   You have bleeding from the mouth or butt (rectum), or you have black or tarry poop (stool).   You feel your heart beat differently than normal, or you have a very fast pulse.   You pass out,  or you twitch and shake when you pass out.   You pass out when sitting or lying down.   You feel confused.   You have trouble walking.   You are weak.   You have vision problems.  MAKE SURE YOU:   Understand these instructions.   Will watch your condition.   Will get help right away if you are not doing well or get worse.  Document Released: 10/21/2007 Document Revised: 04/23/2011 Document Reviewed: 06/20/2010 [facility] Patient Information 2012 [place],  [place].

## 2011-09-23 NOTE — ED Notes (Signed)
MD at bedside. 

## 2011-09-23 NOTE — ED Provider Notes (Signed)
History     CSN: [id number]  Arrival date & time 09/23/11  0845   First MD Initiated Contact with Patient 09/23/11 0901      Chief Complaint  Patient presents with  . Near Syncope    HPI Pt states she felt lightheaded this morning at work. States she was in an elevator and felt like she was going to faint x2. Pt has hx of MS and anemia and often experiences near syncope. States this episode was "worse than others".   Past Medical History  Diagnosis Date  . GERD 12/30/2006  . PARESTHESIA 12/30/2006  . Multiple sclerosis   . Anemia     Past Surgical History  Procedure Date  . Abdominal hysterectomy   . Tonsillectomy   . Anal intraepithelial neoplasia excision     No family history on file.  History  Substance Use Topics  . Smoking status: Never Smoker   . Smokeless tobacco: Not on file  . Alcohol Use: No    OB History    Grav Para Term Preterm Abortions TAB SAB Ect Mult Living                  Review of Systems  All other systems reviewed and are negative.    Allergies  Banana; Penicillins; and Sulfacetamide sodium  Home Medications   Current Outpatient Rx  Name Route Sig Dispense Refill  . ESOMEPRAZOLE MAGNESIUM 40 MG PO CPDR Oral Take 40 mg by mouth daily before breakfast.      . INTERFERON BETA-1A 30 MCG IM KIT Intramuscular Inject 30 mcg into the muscle every 7 (seven) days.      Marland Kitchen POLYETHYLENE GLYCOL 3350 PO POWD Oral Take 17 g by mouth daily.        BP 143/92  Pulse 97  Temp(Src) 97.4 F (36.3 C) (Oral)  Resp 18  Ht 5\' 4"  (1.626 m)  Wt 200 lb (90.719 kg)  BMI 34.33 kg/m2  SpO2 100%  LMP 09/16/2011  Physical Exam  Nursing note and vitals reviewed. Constitutional: She is oriented to person, place, and time. She appears well-developed and well-nourished. No distress.  HENT:  Head: Normocephalic and atraumatic.  Eyes: Pupils are equal, round, and reactive to light.  Neck: Normal range of motion.  Cardiovascular: Normal rate and intact  distal pulses.        Normal sinus rhythm Rate equals 77 Normal EKG  Pulmonary/Chest: No respiratory distress. She has no wheezes.  Abdominal: Normal appearance. She exhibits no distension. There is no tenderness. There is no rebound.  Musculoskeletal: Normal range of motion.  Neurological: She is alert and oriented to person, place, and time. No cranial nerve deficit.  Skin: Skin is warm and dry. No rash noted.  Psychiatric: She has a normal mood and affect. Her behavior is normal.    ED Course  Procedures (including critical care time)  Labs Reviewed  CBC - Abnormal; Notable for the following:    Hemoglobin 10.1 (*)    HCT 31.0 (*)    MCV 67.7 (*)    MCH 22.1 (*)    RDW 18.7 (*)    All other components within normal limits  DIFFERENTIAL  BASIC METABOLIC PANEL  PREGNANCY, URINE  URINE CULTURE   No results found.   1. Near syncope   2. Multiple sclerosis       MDM          Nelia Shi, MD 09/23/11 1031

## 2011-09-23 NOTE — ED Notes (Signed)
Pt states she felt lightheaded this morning at work. States she was in an elevator and felt like she was going to faint x2. Pt has hx of MS and anemia and often experiences near syncope. States this episode was "worse than others". Co-worker called EMS

## 2011-09-24 LAB — URINE CULTURE
Culture  Setup Time: 201305081910
Culture: NO GROWTH

## 2011-12-25 ENCOUNTER — Other Ambulatory Visit: Payer: Self-pay | Admitting: Obstetrics & Gynecology

## 2011-12-25 DIAGNOSIS — Z1231 Encounter for screening mammogram for malignant neoplasm of breast: Secondary | ICD-10-CM

## 2012-01-12 ENCOUNTER — Ambulatory Visit
Admission: RE | Admit: 2012-01-12 | Discharge: 2012-01-12 | Disposition: A | Discharge status: Home / Self Care | Payer: Managed Care, Other (non HMO) | Source: Ambulatory Visit | Attending: Obstetrics & Gynecology | Admitting: Obstetrics & Gynecology

## 2012-01-12 DIAGNOSIS — Z1231 Encounter for screening mammogram for malignant neoplasm of breast: Secondary | ICD-10-CM

## 2012-05-26 ENCOUNTER — Other Ambulatory Visit: Payer: Self-pay | Admitting: *Deleted

## 2012-05-26 DIAGNOSIS — Z0000 Encounter for general adult medical examination without abnormal findings: Secondary | ICD-10-CM

## 2012-05-27 ENCOUNTER — Other Ambulatory Visit (INDEPENDENT_AMBULATORY_CARE_PROVIDER_SITE_OTHER): Payer: Managed Care, Other (non HMO)

## 2012-05-27 DIAGNOSIS — Z0000 Encounter for general adult medical examination without abnormal findings: Secondary | ICD-10-CM

## 2012-05-27 LAB — CBC WITH DIFFERENTIAL/PLATELET
Basophils Absolute: 0 10*3/uL (ref 0.0–0.1)
Eosinophils Relative: 1.7 % (ref 0.0–5.0)
HCT: 32.8 % — ABNORMAL LOW (ref 36.0–46.0)
Hemoglobin: 10.6 g/dL — ABNORMAL LOW (ref 12.0–15.0)
Lymphocytes Relative: 31.9 % (ref 12.0–46.0)
Lymphs Abs: 1.7 10*3/uL (ref 0.7–4.0)
Monocytes Relative: 7.3 % (ref 3.0–12.0)
Platelets: 258 10*3/uL (ref 150.0–400.0)
RDW: 18.2 % — ABNORMAL HIGH (ref 11.5–14.6)
WBC: 5.2 10*3/uL (ref 4.5–10.5)

## 2012-05-27 LAB — HEPATIC FUNCTION PANEL
Albumin: 3.7 g/dL (ref 3.5–5.2)
Alkaline Phosphatase: 83 U/L (ref 39–117)
Total Protein: 7.4 g/dL (ref 6.0–8.3)

## 2012-05-27 LAB — LIPID PANEL
HDL: 53.5 mg/dL (ref >39.00)
LDL Cholesterol: 103 mg/dL — ABNORMAL HIGH (ref 0–99)
Total CHOL/HDL Ratio: 3
Triglycerides: 68 mg/dL (ref 0.0–149.0)
VLDL: 13.6 mg/dL (ref 0.0–40.0)

## 2012-05-27 LAB — BASIC METABOLIC PANEL
CO2: 26 mEq/L (ref 19–32)
Chloride: 107 mEq/L (ref 96–112)
Glucose, Bld: 84 mg/dL (ref 70–99)
Sodium: 140 mEq/L (ref 135–145)

## 2012-05-27 LAB — TSH: TSH: 1.54 u[IU]/mL (ref 0.35–5.50)

## 2012-06-03 ENCOUNTER — Encounter: Payer: Self-pay | Admitting: Internal Medicine

## 2012-06-03 ENCOUNTER — Ambulatory Visit (INDEPENDENT_AMBULATORY_CARE_PROVIDER_SITE_OTHER): Payer: Managed Care, Other (non HMO) | Admitting: Internal Medicine

## 2012-06-03 VITALS — BP 130/80 | HR 72 | Temp 98.5°F | Resp 18 | Ht 63.0 in | Wt 216.0 lb

## 2012-06-03 DIAGNOSIS — Z0000 Encounter for general adult medical examination without abnormal findings: Secondary | ICD-10-CM

## 2012-06-03 DIAGNOSIS — G35 Multiple sclerosis: Secondary | ICD-10-CM

## 2012-06-03 MED ORDER — ESOMEPRAZOLE MAGNESIUM 40 MG PO CPDR: 40.0000 mg | DELAYED_RELEASE_CAPSULE | Freq: Every day | ORAL | Status: DC

## 2012-06-03 NOTE — Progress Notes (Signed)
Subjective:    Patient ID: [name], female    DOB: 1965/11/27, 47 y.o.   MRN: [id number]  HPI 47 year old patient who is followed closely by neurology due to MS. This was initially diagnosed in August of 2008. Her chief complaint is chronic fatigue. She does have some occasional paresthesias and in general has been quite stable. She seen by neurology on an annual basis.  Does also have some mild low back pain and worsening GERD symptoms. She is on chronic Nexium. She is on interferon and more recently has been on Solu-Medrol. She was evaluated by gynecology recently but no laboratory studies were performed.  Past Medical History  Diagnosis Date  . GERD 12/30/2006  . PARESTHESIA 12/30/2006  . Multiple sclerosis   . Anemia     History   Social History  . Marital Status: Married    Spouse Name: N/A    Number of Children: N/A  . Years of Education: N/A   Occupational History  . Not on file.   Social History Main Topics  . Smoking status: Never Smoker   . Smokeless tobacco: Not on file  . Alcohol Use: No  . Drug Use: No  . Sexually Active: Not on file   Other Topics Concern  . Not on file   Social History Narrative  . No narrative on file    Past Surgical History  Procedure Date  . Abdominal hysterectomy   . Tonsillectomy   . Anal intraepithelial neoplasia excision     No family history on file.  Allergies  Allergen Reactions  . Banana   . Penicillins   . Sulfacetamide Sodium     Current Outpatient Prescriptions on File Prior to Visit  Medication Sig Dispense Refill  . esomeprazole (NEXIUM) 40 MG capsule Take 40 mg by mouth daily before breakfast.        . interferon beta-1a (AVONEX) 30 MCG injection Inject 30 mcg into the muscle every 7 (seven) days.        . polyethylene glycol powder (GLYCOLAX/MIRALAX) powder Take 17 g by mouth daily.          BP 130/80  Pulse 72  Temp 98.5 F (36.9 C) (Oral)  Resp 18  Ht 5\' 3"  (1.6 m)  Wt 216 lb (97.977  kg)  BMI 38.26 kg/m2  LMP 09/16/2011   Review of Systems  Constitutional: Positive for fatigue and unexpected weight change.  HENT: Negative for hearing loss, congestion, sore throat, rhinorrhea, dental problem, sinus pressure and tinnitus.   Eyes: Negative for pain, discharge and visual disturbance.  Respiratory: Negative for cough and shortness of breath.   Cardiovascular: Positive for palpitations. Negative for chest pain and leg swelling.  Gastrointestinal: Negative for nausea, vomiting, abdominal pain, diarrhea, constipation, blood in stool and abdominal distention.  Genitourinary: Negative for dysuria, urgency, frequency, hematuria, flank pain, vaginal bleeding, vaginal discharge, difficulty urinating, vaginal pain and pelvic pain.  Musculoskeletal: Positive for back pain. Negative for joint swelling, arthralgias and gait problem.  Skin: Negative for rash.  Neurological: Positive for weakness, light-headedness and numbness. Negative for dizziness, syncope, speech difficulty and headaches.  Hematological: Negative for adenopathy.  Psychiatric/Behavioral: Negative for behavioral problems, dysphoric mood and agitation. The patient is not nervous/anxious.        Objective:   Physical Exam  Constitutional: She is oriented to person, place, and time. She appears well-developed and well-nourished.       Repeat blood pressure 122/80  HENT:  Head: Normocephalic  and atraumatic.  Right Ear: External ear normal.  Left Ear: External ear normal.  Mouth/Throat: Oropharynx is clear and moist.  Eyes: Conjunctivae normal and EOM are normal.  Neck: Normal range of motion. Neck supple. No JVD present. No thyromegaly present.  Cardiovascular: Normal rate, regular rhythm, normal heart sounds and intact distal pulses.   No murmur heard. Pulmonary/Chest: Effort normal and breath sounds normal. She has no wheezes. She has no rales.  Abdominal: Soft. Bowel sounds are normal. She exhibits no distension  and no mass. There is no tenderness. There is no rebound and no guarding.  Musculoskeletal: Normal range of motion. She exhibits no edema and no tenderness.  Neurological: She is alert and oriented to person, place, and time. She has normal reflexes. No cranial nerve deficit. She exhibits normal muscle tone. Coordination normal.  Skin: Skin is warm and dry. No rash noted.  Psychiatric: She has a normal mood and affect. Her behavior is normal.          Assessment & Plan:   multiple sclerosis. Continue present regimen and follow up with neurology  Preventive health examination  laboratory studies reviewed weight loss better diet exercise regimen all encouraged. Will followup with OB/GYN and continue iron supplementation recheck here in one year or as needed

## 2012-06-03 NOTE — Patient Instructions (Signed)
Avoids foods high in acid such as tomatoes citrus juices, and spicy foods.  Avoid eating within two hours of lying down or before exercising.  Do not overheat.  Try smaller more frequent meals.  If symptoms persist, elevate the head of her bed 12 inches while sleeping.    It is important that you exercise regularly, at least 20 minutes 3 to 4 times per week.  If you develop chest pain or shortness of breath seek  medical attention.  You need to lose weight.  Consider a lower calorie diet and regular exercise.    Neurology followup

## 2012-06-03 NOTE — Progress Notes (Deleted)
Patient ID: [name], female   DOB: [birth year], 47 y.o.   MRN: [id number]

## 2012-10-03 ENCOUNTER — Telehealth: Payer: Self-pay | Admitting: Diagnostic Neuroimaging

## 2012-10-03 ENCOUNTER — Ambulatory Visit: Payer: Self-pay | Admitting: Diagnostic Neuroimaging

## 2012-10-03 NOTE — Telephone Encounter (Signed)
Called pt, gave MRI results as requested. Pt said she was currently at work and just became extremely dizzy in the past 10 mins. Has experienced increased numbness for the past week. Pt is sitting down and her spouse was on his way to pick her up. Pt said she is still on Avonex. Advsd to go to ER/Urgent care if she was feeling as bad as she described. Scheduled OV on 10/03/12.

## 2012-10-04 ENCOUNTER — Ambulatory Visit (INDEPENDENT_AMBULATORY_CARE_PROVIDER_SITE_OTHER): Payer: Managed Care, Other (non HMO) | Admitting: Diagnostic Neuroimaging

## 2012-10-04 ENCOUNTER — Encounter: Payer: Self-pay | Admitting: Diagnostic Neuroimaging

## 2012-10-04 VITALS — BP 138/92 | HR 70 | Temp 98.9°F | Ht 63.0 in | Wt 224.0 lb

## 2012-10-04 DIAGNOSIS — G35 Multiple sclerosis: Secondary | ICD-10-CM

## 2012-10-04 NOTE — Patient Instructions (Signed)
Continue avonex.  Send form for cooling vest.

## 2012-10-04 NOTE — Progress Notes (Signed)
[facility]  PATIENT: [name] DOB: Feb 20, 1966  REFERRING CLINICIAN:  HISTORY FROM: patient and husband REASON FOR VISIT: urgent follow up   HISTORICAL  CHIEF COMPLAINT:  Chief Complaint  Patient presents with  . Numbness    in both feet #6    HISTORY OF PRESENT ILLNESS:   UPDATE 10/04/12: Since last visit, patient developed numbness in the bilateral feet approximately one week ago. Symptoms are starting to gradually improve. Her left foot is more affected than right foot. No symptoms above her ankles. No weakness, bowel or bladder dysfunction, upper extremity problems, vision changes, slurred speech, trouble talking.   UPDATE 06/24/12: Since last visit, patient discontinued avonex in Nov 2013 (flu-like sxs, didn't feel like taking it). No flares. In Summer 2013, went to ER for chills, lightheadedness, and almost passing out.  Husband asking about oral meds for MS.   UPDATE 08/05/11 (Dr. Sandria Manly): She has a positive Lhermitte's sign. Her symptoms resolved. She discontinued Avonex 05/2011 related to financial concerns. She was not having side effects from Avonex. She plans on restarting the medication.  DATABASE: Female with a history of obesity, migraine headaches who was diagnosed as having relapsing and remitting multiple sclerosis 12/2006 by Dr. Kelli Hope with transverse myelitis. At that time she had symptoms of right and left leg numbness that extended to her hips and then subsequently to her chest. She also had upper extremity involvement. MRI study of the brain 12/31/06 showed advanced white matter disease In the periventricular region  diffusely extending towards the occipital lobes. DWI was negative and there was no enhancement with contrast. MRI of the cervical spine showed abnormal T2 signal from the inferior endplate of C2 to the superior endplate of C4. There was a small focus of enhancement, measuring 5 mm in the cord. The cord was slightly  swollen and edematous in that location. Laboratory studies were normal B12, CMP, CBC, sedimentation rate, HIV, ANA, ACE, HTLV-1/2, RPR, and TSH. She was treated with high dose IV steroids and placed in the stratify study with JC virus anybody that was positive. She began Avonex 03/2007 and did well except for a recurrent attack in April 2012 treated with  3 days of IV Solu-Medrol without a po taper. MRI studies 11/2010 showed bilateral periventricular subcortical, corpus callosum, left temporal white matter lesions consistent with demyelinating disease and  tiny enhancing lesions were noted in the right cerebellum and  left medial temporal lobes suggestive of progressive disease. MRI of cervical spine showed subtle or hyperintensities in C2 and C4.   REVIEW OF SYSTEMS: Full 14 system review of systems performed and notable only for numbness anemia fatigue joint pain.  ALLERGIES: Allergies  Allergen Reactions  . Banana   . Penicillins   . Sulfacetamide Sodium     HOME MEDICATIONS: Outpatient Prescriptions Prior to Visit  Medication Sig Dispense Refill  . interferon beta-1a (AVONEX) 30 MCG injection Inject 30 mcg into the muscle every 7 (seven) days.        . polyethylene glycol powder (GLYCOLAX/MIRALAX) powder Take 17 g by mouth as needed.       Marland Kitchen esomeprazole (NEXIUM) 40 MG capsule Take 1 capsule (40 mg total) by mouth daily before breakfast.  90 capsule  6   No facility-administered medications prior to visit.    PAST MEDICAL HISTORY: Past Medical History  Diagnosis Date  . GERD 12/30/2006  . PARESTHESIA 12/30/2006  . Multiple sclerosis   . Anemia   . Hypertension   .  Headache   . Stroke   . Color blind     blue/green    PAST SURGICAL HISTORY: Past Surgical History  Procedure Laterality Date  . Abdominal hysterectomy    . Tonsillectomy    . Anal intraepithelial neoplasia excision    . Cesarean section    . Myometomy    . Breast biopsy  10/2009    FAMILY HISTORY: Family  History  Problem Relation Age of Onset  . Stroke Father   . Psychosis Sister   . Hypertension    . Diabetes    . Dementia      SOCIAL HISTORY:  History   Social History  . Marital Status: Married    Spouse Name: N/A    Number of Children: N/A  . Years of Education: College   Occupational History  .  [employer]   Social History Main Topics  . Smoking status: Never Smoker   . Smokeless tobacco: Never Used  . Alcohol Use: No  . Drug Use: No  . Sexually Active: Not on file   Other Topics Concern  . Not on file   Social History Narrative      Caffeine Use: 2 sodas daily     PHYSICAL EXAM  Filed Vitals:   10/04/12 1327  BP: 138/92  Pulse: 70  Temp: 98.9 F (37.2 C)  TempSrc: Oral  Height: 5\' 3"  (1.6 m)  Weight: 224 lb (101.606 kg)   Body mass index is 39.69 kg/(m^2).  GENERAL EXAM: Patient is in no distress  CARDIOVASCULAR: Regular rate and rhythm, no murmurs, no carotid bruits  NEUROLOGIC: MENTAL STATUS: awake, alert, language fluent, comprehension intact, naming intact CRANIAL NERVE: no papilledema on fundoscopic exam, pupils equal and reactive to light, visual fields full to confrontation, extraocular muscles intact, no nystagmus, facial sensation and strength symmetric, uvula midline, shoulder shrug symmetric, tongue midline. MOTOR: normal bulk and tone, full strength in the BUE, BLE SENSORY: normal and symmetric to temperature, vibration; SLIGHTLY DECR PP AND LT IN FEET. COORDINATION: finger-nose-finger, fine finger movements normal REFLEXES: deep tendon reflexes present and symmetric GAIT/STATION: narrow based gait; tandem normal. romberg is negative   DIAGNOSTIC DATA (LABS, IMAGING, TESTING) - I reviewed patient records, labs, notes, testing and imaging myself where available.  Lab Results  Component Value Date   WBC 5.2 05/27/2012   HGB 10.6* 05/27/2012   HCT 32.8* 05/27/2012   MCV 75.3* 05/27/2012   PLT 258.0 05/27/2012      Component  Value Date/Time   NA 140 05/27/2012 0938   K 3.8 05/27/2012 0938   CL 107 05/27/2012 0938   CO2 26 05/27/2012 0938   GLUCOSE 84 05/27/2012 0938   BUN 8 05/27/2012 0938   CREATININE 0.8 05/27/2012 0938   CALCIUM 8.7 05/27/2012 0938   PROT 7.4 05/27/2012 0938   ALBUMIN 3.7 05/27/2012 0938   AST 16 05/27/2012 0938   ALT 9 05/27/2012 0938   ALKPHOS 83 05/27/2012 0938   BILITOT 0.5 05/27/2012 0938   GFRNONAA >90 09/23/2011 0925   GFRAA >90 09/23/2011 0925   Lab Results  Component Value Date   CHOL 170 05/27/2012   HDL 53.50 05/27/2012   LDLCALC 103* 05/27/2012   TRIG 68.0 05/27/2012   CHOLHDL 3 05/27/2012   No results found for this basename: HGBA1C   Lab Results  Component Value Date   VITAMINB12 618 12/31/2006   Lab Results  Component Value Date   TSH 1.54 05/27/2012    07/13/12 MRI  brain (with and without) 1. Multiple periventricular, subcortical and pericallosal chronic demyelinating plaques. 2. No acute plaques are seen. 3. Comparison to prior MRI from 11/20/10 could not be done due to inability to access imaging data from prior    ASSESSMENT AND PLAN  47 y.o. year old female  has a past medical history of GERD (12/30/2006); PARESTHESIA (12/30/2006); Multiple sclerosis; Anemia; Hypertension; Headache; Stroke; and Color blind. here with multiple sclerosis.  Patient has been off of disease modifying therapy since November 2013. Patient developed numbness approximately one week ago, and then restarted Avonex. This may represented mild MS flare. I discussed possibility of steroids but will hold off since symptoms are so mild and spontaneously improving. I discussed other MS medications as well including Gilenya and Tecfidera, but she wants to hold off (has difficult time swallowing pills).  I presented drink more fluids to help with her lightheadedness and dizziness. I also gave her a form to apply for a cooling vest for the summertime through [employer].    Suanne Marker, MD 10/04/2012, 2:17  PM Certified in Neurology, Neurophysiology and Neuroimaging  [facility] [address] [place], [place] [address] [phone]

## 2012-10-11 ENCOUNTER — Telehealth: Payer: Self-pay | Admitting: Diagnostic Neuroimaging

## 2012-10-12 ENCOUNTER — Telehealth: Payer: Self-pay | Admitting: Internal Medicine

## 2012-10-12 ENCOUNTER — Telehealth: Payer: Self-pay | Admitting: *Deleted

## 2012-10-12 DIAGNOSIS — R42 Dizziness and giddiness: Secondary | ICD-10-CM

## 2012-10-12 DIAGNOSIS — G35 Multiple sclerosis: Secondary | ICD-10-CM

## 2012-10-12 NOTE — Telephone Encounter (Signed)
States she is feeling very light headed, feels like she might pass out, some nausea, both feet having numbness, left hand is tingling,  when she turns her head a certain way the light headedness is worse,  and she has ringing in her ears.  States she called yesterday and didn't get a returned call.  Please call this morning.

## 2012-10-12 NOTE — Telephone Encounter (Signed)
Patient states she is feeling very light headed, feels like she might pass out, some nausea, both feet having numbness, left hand is tingling,  when she turns her head a certain way the light headedness is worse,  and she has ringing in her ears.  States she called yesterday and didn't get a returned call.  Please advise and  call this morning.  Patient last seen 07-04-12 by Dr. Marjory Lies. She is a Dr. Sandria Manly transfer patient with MS.

## 2012-10-12 NOTE — Telephone Encounter (Signed)
I called pt. Feeling dizzy for past 1 week. Lightheadedness. Mild spinning this morning. She has checked BP yesterday (115/80). Symptoms worse yesterday. Also ringing in ears, and numbness in feet (similar to last visit symptoms).  PLAN: 1. I will order MRI brain and carotid u/s.  2. I want her to see PCP as well for cardiac or medical causes. Has appt for tomorrow.   Suanne Marker, MD 10/12/2012, 2:25 PM Certified in Neurology, Neurophysiology and Neuroimaging  [facility] [address] [place], [place] [address] [phone]

## 2012-10-12 NOTE — Telephone Encounter (Signed)
Called & spoke with pt. She really prefers to see Dr Kirtland Bouchard, so would rather wait until tomorrow to do so, since he is gone for the day. I sched pt tomorrow a.m. 10am. Encounter closed.

## 2012-10-12 NOTE — Telephone Encounter (Signed)
Patient Information:  Caller Name: Neysha  Phone: [phone]  Patient: [name], [name]  Gender: Female  DOB: 12/05/1965  Age: 47 Years  PCP: Eleonore Chiquito [facility])  Pregnant: No  Office Follow Up:  Does the office need to follow up with this patient?: Yes  Instructions For The Office: Please call back regarding work in appointment.  RN Note:  Stayed home from work 10/12/12 due to lightheaded/dizzy and constant tinnitus. Denies cold symptoms. Had near syncope episodes 10/04/12 and 10/11/12. Dizziness worse since 10/09/12. Vertigo present if moves/turns head.  Reports improving bilateral numbness of feet; still has some sensation. This numbness has been evaluated by neurologist. BP 115/80 P 76-78 10/11/12. No appointments remain with Dr Amador Cunas for 10/12/12.  Message sent to office staff for call back regarding work in appointment.  Symptoms  Reason For Call & Symptoms: Constant ringing in ears and lightheaded/dizziness. History of MS.  Reviewed Health History In EMR: Yes  Reviewed Medications In EMR: Yes  Reviewed Allergies In EMR: Yes  Reviewed Surgeries / Procedures: No  Date of Onset of Symptoms: 10/04/2012 OB / GYN:  LMP: 10/05/2012  Guideline(s) Used:  Dizziness  Disposition Per Guideline:   Go to Office Now  Reason For Disposition Reached:   Spinning or tilting sensation (vertigo) present now  Advice Given:  Temporary Dizziness  is usually a harmless symptom. It can be caused by not drinking enough water during sports or hot weather. It can also be caused by skipping a meal, too much sun exposure, standing up suddenly, standing too long in one place or even a viral illness.  Some Causes of Temporary Dizziness:  Poor Fluid Intake - Not drinking enough fluids and being a little dehydrated is a common cause of temporary dizziness. This is always worse during hot weather.  Standing Up Suddenly - Standing up suddenly (especially getting out of bed) or  prolonged standing in one place are common causes of temporary dizziness. Not drinking enough fluids always makes it worse. Certain medications can cause or increase this type of dizziness (e.g., blood pressure medications).  Drink Fluids:  Drink several glasses of fruit juice, other clear fluids, or water. This will improve hydration and blood glucose. If you have a fever or have had heat exposure, make sure the fluids are cold.  Rest for 1-2 Hours:  Lie down with feet elevated for 1 hour. This will improve blood flow and increase blood flow to the brain.  Stand Up Slowly:  In the mornings, sit up for a few minutes before you stand up. That will help your blood flow make the adjustment.  Sit down or lie down if you feel dizzy.  Call Back If:  Still feel dizzy after 2 hours of rest and fluids  Passes out (faints)  You become worse.  Patient Will Follow Care Advice:  YES

## 2012-10-13 ENCOUNTER — Ambulatory Visit (INDEPENDENT_AMBULATORY_CARE_PROVIDER_SITE_OTHER): Payer: Managed Care, Other (non HMO) | Admitting: Internal Medicine

## 2012-10-13 ENCOUNTER — Encounter: Payer: Self-pay | Admitting: Internal Medicine

## 2012-10-13 ENCOUNTER — Ambulatory Visit (INDEPENDENT_AMBULATORY_CARE_PROVIDER_SITE_OTHER): Payer: Managed Care, Other (non HMO)

## 2012-10-13 VITALS — BP 130/94 | HR 73 | Temp 98.3°F | Resp 20 | Wt 226.0 lb

## 2012-10-13 DIAGNOSIS — G35 Multiple sclerosis: Secondary | ICD-10-CM

## 2012-10-13 DIAGNOSIS — R42 Dizziness and giddiness: Secondary | ICD-10-CM

## 2012-10-13 NOTE — Progress Notes (Signed)
Subjective:    Patient ID: [name], female    DOB: [birth year], 47 y.o.   MRN: [id number]  HPI  47 year old patient who has a history of MS for the past 2 weeks she is had worsening dizziness and unsteadiness. This has been associated with ringing in her ears and some mild headaches. She does see neurology last week and is scheduled for followup as well as a brain MRI later today. Today she feels improved. She has missed work sporadically over the past 2 weeks due to her severe dizziness and unsteadiness  Past Medical History  Diagnosis Date  . GERD 12/30/2006  . PARESTHESIA 12/30/2006  . Multiple sclerosis   . Anemia   . Hypertension   . Headache(784.0)   . Stroke   . Color blind     blue/green    History   Social History  . Marital Status: Married    Spouse Name: N/A    Number of Children: N/A  . Years of Education: College   Occupational History  .  [employer]   Social History Main Topics  . Smoking status: Never Smoker   . Smokeless tobacco: Never Used  . Alcohol Use: No  . Drug Use: No  . Sexually Active: Not on file   Other Topics Concern  . Not on file   Social History Narrative      Caffeine Use: 2 sodas daily    Past Surgical History  Procedure Laterality Date  . Abdominal hysterectomy    . Tonsillectomy    . Anal intraepithelial neoplasia excision    . Cesarean section    . Myometomy    . Breast biopsy  10/2009    Family History  Problem Relation Age of Onset  . Stroke Father   . Psychosis Sister   . Hypertension    . Diabetes    . Dementia      Allergies  Allergen Reactions  . Banana   . Penicillins   . Sulfacetamide Sodium     Current Outpatient Prescriptions on File Prior to Visit  Medication Sig Dispense Refill  . esomeprazole (NEXIUM) 40 MG capsule Take 40 mg by mouth as needed.      . interferon beta-1a (AVONEX) 30 MCG injection Inject 30 mcg into the muscle every 7 (seven) days.        . polyethylene glycol  powder (GLYCOLAX/MIRALAX) powder Take 17 g by mouth as needed.        No current facility-administered medications on file prior to visit.    BP 130/94  Pulse 73  Temp(Src) 98.3 F (36.8 C) (Oral)  Resp 20  Wt 226 lb (102.513 kg)  BMI 40.04 kg/m2  SpO2 98%  LMP 09/16/2011       Review of Systems  HENT: Positive for tinnitus. Negative for hearing loss, congestion, sore throat, rhinorrhea, dental problem and sinus pressure.   Eyes: Negative for pain, discharge and visual disturbance.  Respiratory: Negative for cough and shortness of breath.   Cardiovascular: Negative for chest pain, palpitations and leg swelling.  Gastrointestinal: Negative for nausea, vomiting, abdominal pain, diarrhea, constipation, blood in stool and abdominal distention.  Genitourinary: Negative for dysuria, urgency, frequency, hematuria, flank pain, vaginal bleeding, vaginal discharge, difficulty urinating, vaginal pain and pelvic pain.  Musculoskeletal: Positive for gait problem. Negative for joint swelling and arthralgias.  Skin: Negative for rash.  Neurological: Positive for dizziness, weakness and light-headedness. Negative for syncope, speech difficulty, numbness and headaches.  Hematological: Negative  for adenopathy.  Psychiatric/Behavioral: Negative for behavioral problems, dysphoric mood and agitation. The patient is not nervous/anxious.        Objective:   Physical Exam  Constitutional: She is oriented to person, place, and time. She appears well-developed and well-nourished.  HENT:  Head: Normocephalic.  Right Ear: External ear normal.  Left Ear: External ear normal.  Mouth/Throat: Oropharynx is clear and moist.  Eyes: Conjunctivae and EOM are normal. Pupils are equal, round, and reactive to light.  Neck: Normal range of motion. Neck supple. No thyromegaly present.  Cardiovascular: Normal rate, regular rhythm, normal heart sounds and intact distal pulses.   Pulmonary/Chest: Effort normal and  breath sounds normal.  Abdominal: Soft. Bowel sounds are normal. She exhibits no mass. There is no tenderness.  Musculoskeletal: Normal range of motion.  Lymphadenopathy:    She has no cervical adenopathy.  Neurological: She is alert and oriented to person, place, and time. No cranial nerve deficit.  Physiologic nystagmus only Finger-nose testing appear to be fairly intact  Gait slightly unsteady but not grossly ataxic  Skin: Skin is warm and dry. No rash noted.  Psychiatric: She has a normal mood and affect. Her behavior is normal.          Assessment & Plan:   MS. Probable exacerbation.  Followup neurology later today  A note to excuse from work written today

## 2012-10-13 NOTE — Patient Instructions (Signed)
Followup neurology  Limit your sodium (Salt) intake  Please check your blood pressure on a regular basis.  If it is consistently greater than 150/90, please make an office appointment.

## 2012-10-14 ENCOUNTER — Telehealth: Payer: Self-pay | Admitting: Diagnostic Neuroimaging

## 2012-10-14 DIAGNOSIS — Z0289 Encounter for other administrative examinations: Secondary | ICD-10-CM

## 2012-10-14 MED ORDER — GADOPENTETATE DIMEGLUMINE 469.01 MG/ML IV SOLN: 20.0000 mL | Freq: Once | INTRAVENOUS | Status: AC | PRN

## 2012-10-17 ENCOUNTER — Ambulatory Visit (INDEPENDENT_AMBULATORY_CARE_PROVIDER_SITE_OTHER): Payer: Managed Care, Other (non HMO) | Admitting: *Deleted

## 2012-10-17 ENCOUNTER — Ambulatory Visit (INDEPENDENT_AMBULATORY_CARE_PROVIDER_SITE_OTHER): Payer: Managed Care, Other (non HMO) | Admitting: Diagnostic Neuroimaging

## 2012-10-17 ENCOUNTER — Other Ambulatory Visit: Payer: Self-pay | Admitting: Diagnostic Neuroimaging

## 2012-10-17 ENCOUNTER — Encounter: Payer: Self-pay | Admitting: Diagnostic Neuroimaging

## 2012-10-17 VITALS — BP 140/87 | HR 73 | Temp 99.0°F | Ht 63.0 in | Wt 224.0 lb

## 2012-10-17 DIAGNOSIS — G35 Multiple sclerosis: Secondary | ICD-10-CM

## 2012-10-17 DIAGNOSIS — R51 Headache: Secondary | ICD-10-CM

## 2012-10-17 DIAGNOSIS — R42 Dizziness and giddiness: Secondary | ICD-10-CM

## 2012-10-17 DIAGNOSIS — R519 Headache, unspecified: Secondary | ICD-10-CM

## 2012-10-17 DIAGNOSIS — R112 Nausea with vomiting, unspecified: Secondary | ICD-10-CM

## 2012-10-17 MED ORDER — PREDNISONE 10 MG PO TABS: ORAL_TABLET | ORAL | Status: DC

## 2012-10-17 MED ORDER — SODIUM CHLORIDE 0.9 % IV SOLN
1000.0000 mg | Freq: Every day | INTRAVENOUS | Status: AC
  Administered 2012-10-17: 1000 mg via INTRAVENOUS

## 2012-10-17 NOTE — Progress Notes (Signed)
[facility]  PATIENT: [name] DOB: 1965-07-16  REFERRING CLINICIAN:  HISTORY FROM: patient and husband REASON FOR VISIT: urgent follow up   HISTORICAL  CHIEF COMPLAINT:  Chief Complaint  Patient presents with  . Migraine    dizziness, numbness, numbness in tongue,n&v, severe lightheadness, balance problems (need assitance), neck & back pain, very fatigue    HISTORY OF PRESENT ILLNESS:   UPDATE 10/17/12: Since last visit, continues to have increasing lightheadedness, numbness, nausea, headaches. Patient reports remote history of migraine headaches as a teenager. MRI brain reviewed with patient, which shows some progression of white matter disease without focal new plaques or acute demyelinating plaques. Patient has been on Avonex injections for about 3-4 weeks. This is the same duration of time as the new symptoms.  UPDATE 10/04/12: Since last visit, patient developed numbness in the bilateral feet approximately one week ago. Symptoms are starting to gradually improve. Her left foot is more affected than right foot. No symptoms above her ankles. No weakness, bowel or bladder dysfunction, upper extremity problems, vision changes, slurred speech, trouble talking.   UPDATE 06/24/12: Since last visit, patient discontinued avonex in Nov 2013 (flu-like sxs, didn't feel like taking it). No flares. In Summer 2013, went to ER for chills, lightheadedness, and almost passing out.  Husband asking about oral meds for MS.   UPDATE 08/05/11 (Dr. Sandria Manly): She has a positive Lhermitte's sign. Her symptoms resolved. She discontinued Avonex 05/2011 related to financial concerns. She was not having side effects from Avonex. She plans on restarting the medication.  DATABASE: Female with a history of obesity, migraine headaches who was diagnosed as having relapsing and remitting multiple sclerosis 12/2006 by Dr. Kelli Hope with transverse myelitis. At that time she had symptoms  of right and left leg numbness that extended to her hips and then subsequently to her chest. She also had upper extremity involvement. MRI study of the brain 12/31/06 showed advanced white matter disease In the periventricular region  diffusely extending towards the occipital lobes. DWI was negative and there was no enhancement with contrast. MRI of the cervical spine showed abnormal T2 signal from the inferior endplate of C2 to the superior endplate of C4. There was a small focus of enhancement, measuring 5 mm in the cord. The cord was slightly swollen and edematous in that location. Laboratory studies were normal B12, CMP, CBC, sedimentation rate, HIV, ANA, ACE, HTLV-1/2, RPR, and TSH. She was treated with high dose IV steroids and placed in the stratify study with JC virus anybody that was positive. She began Avonex 03/2007 and did well except for a recurrent attack in April 2012 treated with  3 days of IV Solu-Medrol without a po taper. MRI studies 11/2010 showed bilateral periventricular subcortical, corpus callosum, left temporal white matter lesions consistent with demyelinating disease and  tiny enhancing lesions were noted in the right cerebellum and  left medial temporal lobes suggestive of progressive disease. MRI of cervical spine showed subtle or hyperintensities in C2 and C4.   REVIEW OF SYSTEMS: Full 14 system review of systems performed and notable only for numbness anemia fatigue joint pain.  ALLERGIES: Allergies  Allergen Reactions  . Banana   . Penicillins   . Sulfacetamide Sodium     HOME MEDICATIONS: Outpatient Prescriptions Prior to Visit  Medication Sig Dispense Refill  . esomeprazole (NEXIUM) 40 MG capsule Take 40 mg by mouth as needed.      . interferon beta-1a (AVONEX) 30 MCG injection Inject 30  mcg into the muscle every 7 (seven) days.        . polyethylene glycol powder (GLYCOLAX/MIRALAX) powder Take 17 g by mouth as needed.        No facility-administered medications  prior to visit.    PAST MEDICAL HISTORY: Past Medical History  Diagnosis Date  . GERD 12/30/2006  . PARESTHESIA 12/30/2006  . Multiple sclerosis   . Anemia   . Hypertension   . Headache(784.0)   . Stroke   . Color blind     blue/green    PAST SURGICAL HISTORY: Past Surgical History  Procedure Laterality Date  . Abdominal hysterectomy    . Tonsillectomy    . Anal intraepithelial neoplasia excision    . Cesarean section    . Myometomy    . Breast biopsy  10/2009    FAMILY HISTORY: Family History  Problem Relation Age of Onset  . Stroke Father   . Psychosis Sister   . Hypertension    . Diabetes    . Dementia      SOCIAL HISTORY:  History   Social History  . Marital Status: Married    Spouse Name: Chrissie Noa    Number of Children: N/A  . Years of Education: College   Occupational History  .  [employer]   Social History Main Topics  . Smoking status: Never Smoker   . Smokeless tobacco: Never Used  . Alcohol Use: No  . Drug Use: No  . Sexually Active: Not on file   Other Topics Concern  . Not on file   Social History Narrative   Pt lives at home family.   Caffeine Use: 2 sodas daily     PHYSICAL EXAM  Filed Vitals:   10/17/12 1318  BP: 129/87  Pulse: 91  Temp: 99 F (37.2 C)  TempSrc: Oral  Height: 5\' 3"  (1.6 m)  Weight: 224 lb (101.606 kg)   Body mass index is 39.69 kg/(m^2).  GENERAL EXAM: Patient is in no distress  CARDIOVASCULAR: Regular rate and rhythm, no murmurs, no carotid bruits  NEUROLOGIC: MENTAL STATUS: awake, alert, language fluent, comprehension intact, naming intact CRANIAL NERVE: no papilledema on fundoscopic exam, pupils equal and reactive to light, visual fields full to confrontation, extraocular muscles intact, NYSTAGMUS ON RIGHT GAZE, facial sensation and strength symmetric, uvula midline, shoulder shrug symmetric, tongue midline. MOTOR: normal bulk and tone, full strength in the BUE, BLE SENSORY: normal and  symmetric to temperature, vibration; SLIGHTLY DECR PP AND LT IN FEET. COORDINATION: finger-nose-finger, fine finger movements normal REFLEXES: deep tendon reflexes present and symmetric GAIT/STATION: UNSTEADY WITH STANDING AND GAIT.   DIAGNOSTIC DATA (LABS, IMAGING, TESTING) - I reviewed patient records, labs, notes, testing and imaging myself where available.  Lab Results  Component Value Date   WBC 5.2 05/27/2012   HGB 10.6* 05/27/2012   HCT 32.8* 05/27/2012   MCV 75.3* 05/27/2012   PLT 258.0 05/27/2012      Component Value Date/Time   NA 140 05/27/2012 0938   K 3.8 05/27/2012 0938   CL 107 05/27/2012 0938   CO2 26 05/27/2012 0938   GLUCOSE 84 05/27/2012 0938   BUN 8 05/27/2012 0938   CREATININE 0.8 05/27/2012 0938   CALCIUM 8.7 05/27/2012 0938   PROT 7.4 05/27/2012 0938   ALBUMIN 3.7 05/27/2012 0938   AST 16 05/27/2012 0938   ALT 9 05/27/2012 0938   ALKPHOS 83 05/27/2012 0938   BILITOT 0.5 05/27/2012 0938   GFRNONAA >90 09/23/2011 1610  GFRAA >90 09/23/2011 0925   Lab Results  Component Value Date   CHOL 170 05/27/2012   HDL 53.50 05/27/2012   LDLCALC 103* 05/27/2012   TRIG 68.0 05/27/2012   CHOLHDL 3 05/27/2012   No results found for this basename: HGBA1C   Lab Results  Component Value Date   VITAMINB12 618 12/31/2006   Lab Results  Component Value Date   TSH 1.54 05/27/2012    07/13/12 MRI brain (with and without) 1. Multiple periventricular, subcortical and pericallosal chronic demyelinating plaques. 2. No acute plaques are seen. 3. Comparison to prior MRI from 11/20/10 could not be done due to inability to access imaging data from prior    ASSESSMENT AND PLAN  47 y.o. year old female  has a past medical history of GERD (12/30/2006); PARESTHESIA (12/30/2006); Multiple sclerosis; Anemia; Hypertension; Headache(784.0); Stroke; and Color blind. here with multiple sclerosis.    PLAN: 1. Labs ti eval for other causes of dizziness, headache, nausea 2. Solumedrol IV x 3 days; then  prednisone taper 3. I discussed other MS medications as well including Gilenya and Tecfidera, but she wants to hold off (has difficult time swallowing pills) 4. Drink more fluids to help with her lightheadedness and dizziness 5. Use cooling vest for the summertime    Suanne Marker, MD 10/17/2012, 1:45 PM Certified in Neurology, Neurophysiology and Neuroimaging  [facility] [address] [place], [place] [address] [phone]

## 2012-10-17 NOTE — Patient Instructions (Signed)
Pt  To come back tomorrow for day 2/3 IV solumedrol.

## 2012-10-17 NOTE — Telephone Encounter (Signed)
I spoke to patient. Will have her come in this afternoon for solumedrol. -VRP

## 2012-10-17 NOTE — Progress Notes (Signed)
Pt here for numbness, dizziness, nausea, fatigue, headache(which has been getting progressively worse over the last 3-4 wks). Seen Dr. Marjory Lies and order for solumedrol 1000mg  IV for 3 day.   First dose given today after lab work draw.  IV butterfly 23g placed to L outer AC with good blood return.  IVF 0.9NS / and  IV solumedrol 1000mg  started 1406 finishes at 1457.  Pt tolerated well.  Warm compress to L forearm for c/o some aching pain that radiated from IV to arm to shoulder.  IV infusing well , no s/s infiltration.  IV flushed.   IV discontinued , bandage applied. Pt to restroom with husband for urine sample for UA.  Wheelchair for pt to check out.  Pt tolerated the infusion well.   Will see tomorrow at 1100.

## 2012-10-17 NOTE — Patient Instructions (Signed)
I will check labs and start steroids.  You are excused from work until October 25, 2012.

## 2012-10-17 NOTE — Telephone Encounter (Signed)
Pt called back this am.   Has had bad weekend.  Headache, feet numbness, dizziness, weakness.   Needing help by husband.  Also states needing to be out on leave.  Made appt for 1300 to assess.  Go over results.   I did give her results of MRI brain, no enhancing or active lesions, but significant progressive white matter disease.  She has asked about 2-14 comparison.  Will consult with Dr. Marjory Lies and then to call pt or see at [phone].

## 2012-10-18 ENCOUNTER — Ambulatory Visit: Payer: Self-pay

## 2012-10-18 ENCOUNTER — Encounter (INDEPENDENT_AMBULATORY_CARE_PROVIDER_SITE_OTHER): Payer: Managed Care, Other (non HMO) | Admitting: *Deleted

## 2012-10-18 ENCOUNTER — Ambulatory Visit (INDEPENDENT_AMBULATORY_CARE_PROVIDER_SITE_OTHER): Payer: Managed Care, Other (non HMO) | Admitting: *Deleted

## 2012-10-18 VITALS — BP 118/84 | HR 83 | Temp 98.8°F

## 2012-10-18 DIAGNOSIS — G35D Multiple sclerosis, unspecified: Secondary | ICD-10-CM

## 2012-10-18 DIAGNOSIS — Z0289 Encounter for other administrative examinations: Secondary | ICD-10-CM

## 2012-10-18 DIAGNOSIS — G35 Multiple sclerosis: Secondary | ICD-10-CM

## 2012-10-18 LAB — COMPREHENSIVE METABOLIC PANEL
AST: 11 IU/L (ref 0–40)
Albumin/Globulin Ratio: 1.1 (ref 1.1–2.5)
Alkaline Phosphatase: 91 IU/L (ref 39–117)
BUN/Creatinine Ratio: 13 (ref 9–23)
Creatinine, Ser: 0.71 mg/dL (ref 0.57–1.00)
GFR calc non Af Amer: 104 mL/min/{1.73_m2} (ref >59)
Globulin, Total: 3.6 g/dL (ref 1.5–4.5)
Sodium: 139 mmol/L (ref 134–144)
Total Bilirubin: 0.2 mg/dL (ref 0.0–1.2)

## 2012-10-18 LAB — CBC WITH DIFFERENTIAL
Eos: 1 % (ref 0–5)
Eosinophils Absolute: 0.1 10*3/uL (ref 0.0–0.4)
Immature Granulocytes: 0 % (ref 0–2)
Lymphocytes Absolute: 1.9 10*3/uL (ref 0.7–3.1)
MCH: 25.1 pg — ABNORMAL LOW (ref 26.6–33.0)
MCHC: 32.7 g/dL (ref 31.5–35.7)
MCV: 77 fL — ABNORMAL LOW (ref 79–97)
Monocytes Absolute: 0.7 10*3/uL (ref 0.1–0.9)
Platelets: 305 10*3/uL (ref 155–379)
RBC: 4.55 x10E6/uL (ref 3.77–5.28)

## 2012-10-18 LAB — UA/M W/RFLX CULTURE, ROUTINE
Bilirubin, UA: NEGATIVE
Glucose, UA: NEGATIVE
Ketones, UA: NEGATIVE
Protein, UA: NEGATIVE

## 2012-10-18 LAB — MICROSCOPIC EXAMINATION

## 2012-10-18 LAB — HEMOGLOBIN A1C
Est. average glucose Bld gHb Est-mCnc: 128 mg/dL
Hgb A1c MFr Bld: 6.1 % — ABNORMAL HIGH (ref 4.8–5.6)

## 2012-10-18 LAB — VITAMIN B12: Vitamin B-12: 161 pg/mL — ABNORMAL LOW (ref 211–946)

## 2012-10-18 LAB — TSH: TSH: 2.24 u[IU]/mL (ref 0.450–4.500)

## 2012-10-18 MED ORDER — SODIUM CHLORIDE 0.9 % IV SOLN
1000.0000 mg | INTRAVENOUS | Status: DC
  Administered 2012-10-18 – 2012-10-19 (×2): 1000 mg via INTRAVENOUS

## 2012-10-18 NOTE — Progress Notes (Signed)
Pt here for Day 2 /3 solumedrol 1000mg  IV infusion.  She continues with migraine (level 3-4), instructed may take tylenol if needed and also her nexium if having reflux/ heartburn.  She is to continue with plan of carotid doppler scheduled 10-27-12.  Lab results given to pt and relayed the low B 12 level and Dr. Richrd Humbles recommendation of  IM supplement (cyanocobalamin 1035mcg/ 1 ml IM once weekly for 4 wks then monthly).  Pt to touch base with Dr. Lesia Hausen her pcp.  Under aseptic technique 24g angiocath inserted to L forearm with good blood return.   Tolerated well.  Taped securely.  Solumedrol infusion started 1113 and ended 1205.  Tolerated well.   IV made to NSL, and IV wrapped and pt to return tomorrow for last day of infusion.  Instructed on keeping site dry.  Verbalized understanding both pt and her spouse.

## 2012-10-18 NOTE — Patient Instructions (Addendum)
Pt to come in tomorrow for Day 3/3 of Solumedrol 1000mg  IV.

## 2012-10-18 NOTE — Progress Notes (Signed)
This encounter was created in error - please disregard.

## 2012-10-19 ENCOUNTER — Ambulatory Visit (INDEPENDENT_AMBULATORY_CARE_PROVIDER_SITE_OTHER): Payer: Managed Care, Other (non HMO) | Admitting: *Deleted

## 2012-10-19 DIAGNOSIS — E538 Deficiency of other specified B group vitamins: Secondary | ICD-10-CM

## 2012-10-19 DIAGNOSIS — G35 Multiple sclerosis: Secondary | ICD-10-CM

## 2012-10-19 MED ORDER — CYANOCOBALAMIN 1000 MCG/ML IJ SOLN
1000.0000 ug | Freq: Once | INTRAMUSCULAR | Status: AC
  Administered 2012-10-19: 1000 ug via INTRAMUSCULAR

## 2012-10-19 NOTE — Progress Notes (Signed)
Pt here for her final day of solumedrol 1000mg  IV.    Her IV to L forearm intact, soft, pink flushes well with normal saline (5cc).  Solumedrol infusion at 1135, infusing well.  VSS.  She is still c/o dizziness, unsteadiness, numbness in feet, slept well last night, no appetite, tinnitus bilaterally (> L ear), nausea better, headache, level 2, walking better-faster today then past 2 days, needed assistance from husband with steps.    Made comfortable , reclining in chair, sheet cover applied.  Lights dimmed.  B12 injection IM given today in R deltoid.  Tolerated well.  Bandaid applied.   She will see her pcp next week and then will get injection there but if not will get when in for carotid doppler next week.  Oral prednisone ordered for pt which she has reflux and will take her nexium when starts taking these.  1214 Pt c/o L arm, neck, sternal ache.  No reflux c/o.  VS 124/84, 84 pulse = and regular.  Gum/peppermint given for bad taste.  Dr. Marjory Lies made aware and saw pt, steroid can cause this.  1241, pt slightly better, still chest ache, having dizziness/ feeling of passing out sitting down.  VS 128/84  HR 72 ,  Gave drink.  Husband at side.  1259 VSS 123/81, P 74.  Still with ache, but pt states better.  Both pt and husband drowsy.  Pt and husband to check out.     Appt to Heide Guile, NP in 3 mo per Dr. Marjory Lies.

## 2012-10-19 NOTE — Patient Instructions (Signed)
Pt to call tomorrow to let us know how she is.   Oral steroids e scribed to her pharmacy.  Call for problems or concerns.

## 2012-10-20 ENCOUNTER — Telehealth: Payer: Self-pay | Admitting: Diagnostic Neuroimaging

## 2012-10-20 NOTE — Telephone Encounter (Signed)
I called pt and and let her know that I would forward to Dr. Marjory Lies and if she gets worse then to go to ER.

## 2012-10-24 ENCOUNTER — Telehealth: Payer: Self-pay | Admitting: Diagnostic Neuroimaging

## 2012-10-24 NOTE — Telephone Encounter (Signed)
Pt states she is doing better, but is not fully better. Advised it is ok to continue Avonex.  Answered patient's questions about disability forms and upcoming carotid doppler.

## 2012-10-25 ENCOUNTER — Telehealth: Payer: Self-pay | Admitting: Diagnostic Neuroimaging

## 2012-10-25 DIAGNOSIS — Z0279 Encounter for issue of other medical certificate: Secondary | ICD-10-CM

## 2012-10-25 NOTE — Telephone Encounter (Signed)
Sent to MR. 

## 2012-10-25 NOTE — Telephone Encounter (Signed)
States she just spoke to [facility] and they told her they did not get any test results, treatments.  They only got your office notes.  They need everything.  Back as far as her first visit.  She signed a release to have EVERYTHING sent.  They only got notes from about a week ago.  States he has had several tests.  Fax that to Monia Pouch [phone] today if possible.  Call her if you have any questions.

## 2012-10-27 ENCOUNTER — Ambulatory Visit (INDEPENDENT_AMBULATORY_CARE_PROVIDER_SITE_OTHER): Payer: Managed Care, Other (non HMO) | Admitting: *Deleted

## 2012-10-27 ENCOUNTER — Encounter: Payer: Self-pay | Admitting: Internal Medicine

## 2012-10-27 ENCOUNTER — Ambulatory Visit (INDEPENDENT_AMBULATORY_CARE_PROVIDER_SITE_OTHER): Payer: Managed Care, Other (non HMO) | Admitting: Internal Medicine

## 2012-10-27 ENCOUNTER — Ambulatory Visit (INDEPENDENT_AMBULATORY_CARE_PROVIDER_SITE_OTHER): Payer: Managed Care, Other (non HMO)

## 2012-10-27 VITALS — BP 158/94 | HR 93 | Resp 20 | Wt 227.0 lb

## 2012-10-27 DIAGNOSIS — E538 Deficiency of other specified B group vitamins: Secondary | ICD-10-CM

## 2012-10-27 DIAGNOSIS — G35 Multiple sclerosis: Secondary | ICD-10-CM

## 2012-10-27 DIAGNOSIS — R209 Unspecified disturbances of skin sensation: Secondary | ICD-10-CM

## 2012-10-27 DIAGNOSIS — R42 Dizziness and giddiness: Secondary | ICD-10-CM

## 2012-10-27 MED ORDER — "NEEDLE (DISP) 25G X 1"" MISC": 1.0000 | Status: DC

## 2012-10-27 MED ORDER — CYANOCOBALAMIN 1000 MCG/ML IJ SOLN: INTRAMUSCULAR | Status: DC

## 2012-10-27 MED ORDER — CYANOCOBALAMIN 1000 MCG/ML IJ SOLN
1000.0000 ug | Freq: Once | INTRAMUSCULAR | Status: AC
  Administered 2012-10-27: 1000 ug via INTRAMUSCULAR

## 2012-10-27 MED ORDER — SYRINGE (DISPOSABLE) 3 ML MISC: 1.0000 | Status: DC

## 2012-10-27 NOTE — Progress Notes (Signed)
Subjective:    Patient ID: [name], female    DOB: 1965/09/07, 47 y.o.   MRN: [id number]  HPI  47 year old patient has a history of MS since 2008. She has been followed closely by neurology due to a recent exacerbation. She was treated with IV Solu-Medrol for 3 days and presently is on a prednisone taper. She also remains on interferon. She has done much better recently last week she required assistance with aspects of daily living but now her gait has much improved. She continues to have some dizziness and headaches but this also has improved. She is scheduled for a carotid artery Doppler study later today.  Past Medical History  Diagnosis Date  . GERD 12/30/2006  . PARESTHESIA 12/30/2006  . Multiple sclerosis   . Anemia   . Hypertension   . Headache(784.0)   . Stroke   . Color blind     blue/green    History   Social History  . Marital Status: Married    Spouse Name: Chrissie Noa    Number of Children: N/A  . Years of Education: College   Occupational History  .  [employer]   Social History Main Topics  . Smoking status: Never Smoker   . Smokeless tobacco: Never Used  . Alcohol Use: No  . Drug Use: No  . Sexually Active: Not on file   Other Topics Concern  . Not on file   Social History Narrative   Pt lives at home family.   Caffeine Use: 2 sodas daily    Past Surgical History  Procedure Laterality Date  . Abdominal hysterectomy    . Tonsillectomy    . Anal intraepithelial neoplasia excision    . Cesarean section    . Myometomy    . Breast biopsy  10/2009    Family History  Problem Relation Age of Onset  . Stroke Father   . Psychosis Sister   . Hypertension    . Diabetes    . Dementia      Allergies  Allergen Reactions  . Banana   . Penicillins   . Sulfacetamide Sodium     Current Outpatient Prescriptions on File Prior to Visit  Medication Sig Dispense Refill  . anti-nausea (EMETROL) solution Take by mouth every 15 (fifteen)  minutes as needed for nausea.      Marland Kitchen esomeprazole (NEXIUM) 40 MG capsule Take 40 mg by mouth as needed.      . interferon beta-1a (AVONEX) 30 MCG injection Inject 30 mcg into the muscle every 7 (seven) days.        . polyethylene glycol powder (GLYCOLAX/MIRALAX) powder Take 17 g by mouth as needed.       . predniSONE (DELTASONE) 10 MG tablet Take 60mg  daily x 2 days. Then reduce by 10mg  every 2 days. (60, 60, 50, 50, 40, 40, 30, 30, 20, 20, 10, 10, then stop).  42 tablet  0   Current Facility-Administered Medications on File Prior to Visit  Medication Dose Route Frequency Provider Last Rate Last Dose  . methylPREDNISolone sodium succinate (SOLU-MEDROL) 1,000 mg in sodium chloride 0.9 % 100 mL IVPB  1,000 mg Intravenous Continuous Suanne Marker, MD   1,000 mg at 10/19/12 1135    BP 158/94  Pulse 93  Resp 20  Wt 227 lb (102.967 kg)  BMI 40.22 kg/m2  SpO2 98%  LMP 09/16/2011       Review of Systems  Constitutional: Positive for fatigue.  HENT: Negative  for hearing loss, congestion, sore throat, rhinorrhea, dental problem, sinus pressure and tinnitus.   Eyes: Negative for pain, discharge and visual disturbance.  Respiratory: Negative for cough and shortness of breath.   Cardiovascular: Negative for chest pain, palpitations and leg swelling.  Gastrointestinal: Negative for nausea, vomiting, abdominal pain, diarrhea, constipation, blood in stool and abdominal distention.  Genitourinary: Negative for dysuria, urgency, frequency, hematuria, flank pain, vaginal bleeding, vaginal discharge, difficulty urinating, vaginal pain and pelvic pain.  Musculoskeletal: Positive for gait problem. Negative for joint swelling and arthralgias.  Skin: Negative for rash.  Neurological: Positive for light-headedness and headaches. Negative for dizziness, syncope, speech difficulty, weakness and numbness.  Hematological: Negative for adenopathy.  Psychiatric/Behavioral: Negative for behavioral problems,  dysphoric mood and agitation. The patient is not nervous/anxious.        Objective:   Physical Exam  Constitutional: She is oriented to person, place, and time. She appears well-developed and well-nourished.  Stand from sitting position and ambulates to the examining table without difficulty Repeat blood pressure 150/80  HENT:  Head: Normocephalic.  Right Ear: External ear normal.  Left Ear: External ear normal.  Mouth/Throat: Oropharynx is clear and moist.  Eyes: Conjunctivae and EOM are normal. Pupils are equal, round, and reactive to light.  Neck: Normal range of motion. Neck supple. No thyromegaly present.  Cardiovascular: Normal rate, regular rhythm, normal heart sounds and intact distal pulses.   Pulmonary/Chest: Effort normal and breath sounds normal.  Abdominal: Soft. Bowel sounds are normal. She exhibits no mass. There is no tenderness.  Musculoskeletal: Normal range of motion.  Lymphadenopathy:    She has no cervical adenopathy.  Neurological: She is alert and oriented to person, place, and time. No cranial nerve deficit.  Skin: Skin is warm and dry. No rash noted.  Psychiatric: She has a normal mood and affect. Her behavior is normal.          Assessment & Plan:   MS exacerbation. Improved. Followup neurology. Follow carotid artery Doppler studies as scheduled Headaches improved

## 2012-10-27 NOTE — Patient Instructions (Addendum)
It is important that you exercise regularly, at least 20 minutes 3 to 4 times per week.  If you develop chest pain or shortness of breath seek  medical attention.  Call or return to clinic prn if these symptoms worsen or fail to improve as anticipated.  Followup neurology

## 2012-10-27 NOTE — Progress Notes (Signed)
Patient here for B 12 injection. Under aseptic technique. B12 administered in R deltiod. Tolerated will.

## 2012-10-27 NOTE — Patient Instructions (Signed)
Discussed rationale for use and expected effects the patient should see with continued administration over time. Patient will schedule next appointment in one week.

## 2012-11-03 ENCOUNTER — Ambulatory Visit (INDEPENDENT_AMBULATORY_CARE_PROVIDER_SITE_OTHER): Payer: Managed Care, Other (non HMO)

## 2012-11-03 DIAGNOSIS — E538 Deficiency of other specified B group vitamins: Secondary | ICD-10-CM

## 2012-11-03 DIAGNOSIS — G35 Multiple sclerosis: Secondary | ICD-10-CM

## 2012-11-03 MED ORDER — CYANOCOBALAMIN 1000 MCG/ML IJ SOLN: INTRAMUSCULAR | Status: DC

## 2012-11-03 MED ORDER — CYANOCOBALAMIN 1000 MCG/ML IJ SOLN
1000.0000 ug | Freq: Once | INTRAMUSCULAR | Status: AC
  Administered 2012-11-03: 1000 ug via INTRAMUSCULAR

## 2012-11-03 NOTE — Progress Notes (Signed)
B 12 injection administered under aseptic technique in R Deltoid. Band aid applied.Tolerated well.

## 2012-11-03 NOTE — Patient Instructions (Signed)
Patient feel not having much effect from B12 injection yet. Discussed rationale for use and for giving first four doses over a one month period. Patient to return in one week.

## 2012-11-10 ENCOUNTER — Ambulatory Visit (INDEPENDENT_AMBULATORY_CARE_PROVIDER_SITE_OTHER): Payer: Managed Care, Other (non HMO) | Admitting: Neurology

## 2012-11-10 DIAGNOSIS — G35D Multiple sclerosis, unspecified: Secondary | ICD-10-CM

## 2012-11-10 DIAGNOSIS — G35 Multiple sclerosis: Secondary | ICD-10-CM

## 2012-11-10 MED ORDER — CYANOCOBALAMIN 1000 MCG/ML IJ SOLN
1000.0000 ug | Freq: Once | INTRAMUSCULAR | Status: AC
  Administered 2012-11-10: 1000 ug via INTRAMUSCULAR

## 2012-11-10 NOTE — Progress Notes (Signed)
Patient here for B12 injection.  Under aseptic technique Cyanocobalamin 1000mcg/1ml given IM in left deltoid.  Tolerated well.  Band-Aid applied. 

## 2012-11-10 NOTE — Patient Instructions (Signed)
Patient will return for monthly injection, on 12-08-12.

## 2012-11-14 ENCOUNTER — Telehealth: Payer: Self-pay | Admitting: Diagnostic Neuroimaging

## 2012-11-14 ENCOUNTER — Telehealth: Payer: Self-pay | Admitting: Internal Medicine

## 2012-11-14 NOTE — Telephone Encounter (Signed)
Spoke to patient. Created return to work Physicist, medical. Placed at front desk for pick up by 5 today by patient.

## 2012-11-14 NOTE — Telephone Encounter (Signed)
Pt needs work note to return to work tomorrow with no restrictions. Pt would like to pick up note today

## 2012-11-15 NOTE — Telephone Encounter (Signed)
Left message on voicemail to call office.  

## 2012-11-17 NOTE — Telephone Encounter (Signed)
Pt has not returned phone call saw in chart note provided by Neurology.

## 2012-12-08 ENCOUNTER — Ambulatory Visit (INDEPENDENT_AMBULATORY_CARE_PROVIDER_SITE_OTHER): Payer: Managed Care, Other (non HMO) | Admitting: Neurology

## 2012-12-08 DIAGNOSIS — G35 Multiple sclerosis: Secondary | ICD-10-CM

## 2012-12-08 DIAGNOSIS — E538 Deficiency of other specified B group vitamins: Secondary | ICD-10-CM

## 2012-12-08 MED ORDER — CYANOCOBALAMIN 1000 MCG/ML IJ SOLN
1000.0000 ug | Freq: Once | INTRAMUSCULAR | Status: AC
  Administered 2012-12-08: 1000 ug via INTRAMUSCULAR

## 2012-12-08 NOTE — Progress Notes (Signed)
Patient here for B12 injection.  Under aseptic technique Cyanocobalamin 1000mcg/1ml given IM in left deltoid.  Tolerated well.  Band-Aid applied. 

## 2012-12-08 NOTE — Patient Instructions (Signed)
Patient will return next month for her B12 injection.

## 2013-01-03 ENCOUNTER — Ambulatory Visit: Payer: Self-pay | Admitting: Diagnostic Neuroimaging

## 2013-01-06 ENCOUNTER — Ambulatory Visit (INDEPENDENT_AMBULATORY_CARE_PROVIDER_SITE_OTHER): Payer: Managed Care, Other (non HMO) | Admitting: *Deleted

## 2013-01-06 DIAGNOSIS — E538 Deficiency of other specified B group vitamins: Secondary | ICD-10-CM

## 2013-01-06 MED ORDER — CYANOCOBALAMIN 1000 MCG/ML IJ SOLN
1000.0000 ug | INTRAMUSCULAR | Status: DC
  Administered 2013-01-06 – 2013-04-11 (×4): 1000 ug via INTRAMUSCULAR

## 2013-01-06 NOTE — Patient Instructions (Addendum)
See next month. 

## 2013-01-06 NOTE — Progress Notes (Signed)
Pt here for B 12 injection.  Under aseptic technique cyanocobalamin 1000mcg/1ml IM given R deltoid.  Tolerated well.  Bandaid applied.  

## 2013-01-19 ENCOUNTER — Ambulatory Visit: Payer: Managed Care, Other (non HMO) | Admitting: Nurse Practitioner

## 2013-02-03 ENCOUNTER — Ambulatory Visit (INDEPENDENT_AMBULATORY_CARE_PROVIDER_SITE_OTHER): Payer: Managed Care, Other (non HMO) | Admitting: Nurse Practitioner

## 2013-02-03 ENCOUNTER — Encounter: Payer: Self-pay | Admitting: Nurse Practitioner

## 2013-02-03 ENCOUNTER — Ambulatory Visit (INDEPENDENT_AMBULATORY_CARE_PROVIDER_SITE_OTHER): Payer: Self-pay

## 2013-02-03 VITALS — BP 130/87 | HR 87 | Temp 98.3°F | Ht 65.0 in | Wt 228.0 lb

## 2013-02-03 DIAGNOSIS — G35 Multiple sclerosis: Secondary | ICD-10-CM

## 2013-02-03 DIAGNOSIS — Z0289 Encounter for other administrative examinations: Secondary | ICD-10-CM

## 2013-02-03 DIAGNOSIS — E538 Deficiency of other specified B group vitamins: Secondary | ICD-10-CM

## 2013-02-03 NOTE — Progress Notes (Signed)
[facility]  PATIENT: [name] DOB: December 19, 1965   REASON FOR VISIT: follow up HISTORY FROM: patient  HISTORY OF PRESENT ILLNESS: UPDATE 02/03/13 (LL):  Patient returns for follow up since last visit in June.  She states she was extremely sick at last visit and it lasted about 30 days until the weakness and nausea resolved.  She is doing very well since, has had some numbness in both feet for the last 2 weeks, but this has been intermittent she states since she was diagnosed with MS.  She is tolerating Avonex well, no adverse effects at this time or problems at injection sites.    UPDATE 10/17/12: Since last visit, continues to have increasing lightheadedness, numbness, nausea, headaches. Patient reports remote history of migraine headaches as a teenager. MRI brain reviewed with patient, which shows some progression of white matter disease without focal new plaques or acute demyelinating plaques. Patient has been on Avonex injections for about 3-4 weeks. This is the same duration of time as the new symptoms. UPDATE 10/04/12: Since last visit, patient developed numbness in the bilateral feet approximately one week ago. Symptoms are starting to gradually improve. Her left foot is more affected than right foot. No symptoms above her ankles. No weakness, bowel or bladder dysfunction, upper extremity problems, vision changes, slurred speech, trouble talking.  UPDATE 06/24/12: Since last visit, patient discontinued avonex in Nov 2013 (flu-like sxs, didn't feel like taking it). No flares. In Summer 2013, went to ER for chills, lightheadedness, and almost passing out. Husband asking about oral meds for MS.  UPDATE 08/05/11 (Dr. Sandria Manly): She has a positive Lhermitte's sign. Her symptoms resolved. She discontinued Avonex 05/2011 related to financial concerns. She was not having side effects from Avonex. She plans on restarting the medication.  DATABASE: Female with a history of obesity,  migraine headaches who was diagnosed as having relapsing and remitting multiple sclerosis 12/2006 by Dr. Kelli Hope with transverse myelitis. At that time she had symptoms of right and left leg numbness that extended to her hips and then subsequently to her chest. She also had upper extremity involvement. MRI study of the brain 12/31/06 showed advanced white matter disease In the periventricular region diffusely extending towards the occipital lobes. DWI was negative and there was no enhancement with contrast. MRI of the cervical spine showed abnormal T2 signal from the inferior endplate of C2 to the superior endplate of C4. There was a small focus of enhancement, measuring 5 mm in the cord. The cord was slightly swollen and edematous in that location. Laboratory studies were normal B12, CMP, CBC, sedimentation rate, HIV, ANA, ACE, HTLV-1/2, RPR, and TSH. She was treated with high dose IV steroids and placed in the stratify study with JC virus anybody that was positive. She began Avonex 03/2007 and did well except for a recurrent attack in April 2012 treated with 3 days of IV Solu-Medrol without a po taper. MRI studies 11/2010 showed bilateral periventricular subcortical, corpus callosum, left temporal white matter lesions consistent with demyelinating disease and tiny enhancing lesions were noted in the right cerebellum and left medial temporal lobes suggestive of progressive disease. MRI of cervical spine showed subtle or hyperintensities in C2 and C4.   REVIEW OF SYSTEMS: Full 14 system review of systems performed and notable only for numbness.  ALLERGIES: Allergies  Allergen Reactions  . Banana   . Penicillins   . Sulfacetamide Sodium     HOME MEDICATIONS: Outpatient Prescriptions Prior to Visit  Medication  Sig Dispense Refill  . anti-nausea (EMETROL) solution Take by mouth every 15 (fifteen) minutes as needed for nausea.      Marland Kitchen esomeprazole (NEXIUM) 40 MG capsule Take 40 mg by mouth as  needed.      . interferon beta-1a (AVONEX) 30 MCG injection Inject 30 mcg into the muscle every 7 (seven) days.        Marland Kitchen NEEDLE, DISP, 25 G (B-D DISP NEEDLE 25GX1") 25G X 1" MISC 1 each by Does not apply route as directed.  10 each  2  . polyethylene glycol powder (GLYCOLAX/MIRALAX) powder Take 17 g by mouth as needed.       . predniSONE (DELTASONE) 10 MG tablet Take 60mg  daily x 2 days. Then reduce by 10mg  every 2 days. (60, 60, 50, 50, 40, 40, 30, 30, 20, 20, 10, 10, then stop).  42 tablet  0  . Syringe, Disposable, (B-D SYRINGE LUER-LOK 3CC) 3 ML MISC 1 each by Does not apply route as directed.  25 each  2   Facility-Administered Medications Prior to Visit  Medication Dose Route Frequency Provider Last Rate Last Dose  . cyanocobalamin ((VITAMIN B-12)) injection 1,000 mcg  1,000 mcg Intramuscular Q30 days Suanne Marker, MD   1,000 mcg at 01/06/13 1705  . methylPREDNISolone sodium succinate (SOLU-MEDROL) 1,000 mg in sodium chloride 0.9 % 100 mL IVPB  1,000 mg Intravenous Continuous Suanne Marker, MD   1,000 mg at 10/19/12 1135    PAST MEDICAL HISTORY: Past Medical History  Diagnosis Date  . GERD 12/30/2006  . PARESTHESIA 12/30/2006  . Multiple sclerosis   . Anemia   . Hypertension   . Headache(784.0)   . Stroke   . Color blind     blue/green    PAST SURGICAL HISTORY: Past Surgical History  Procedure Laterality Date  . Abdominal hysterectomy    . Tonsillectomy    . Anal intraepithelial neoplasia excision    . Cesarean section    . Myometomy    . Breast biopsy  10/2009    FAMILY HISTORY: Family History  Problem Relation Age of Onset  . Stroke Father   . Psychosis Sister   . Hypertension    . Diabetes    . Dementia      SOCIAL HISTORY: History   Social History  . Marital Status: Married    Spouse Name: Chrissie Noa    Number of Children: N/A  . Years of Education: College   Occupational History  .  [employer]   Social History Main Topics  . Smoking  status: Never Smoker   . Smokeless tobacco: Never Used  . Alcohol Use: No  . Drug Use: No  . Sexual Activity: Not on file   Other Topics Concern  . Not on file   Social History Narrative   Pt lives at home family.   Caffeine Use: 2 sodas daily     PHYSICAL EXAM  There were no vitals filed for this visit. There is no weight on file to calculate BMI.  Generalized: Well developed, in no acute distress  Head: normocephalic and atraumatic. Oropharynx benign  Neck: Supple, no carotid bruits  Cardiac: Regular rate rhythm, no murmur  Musculoskeletal: No deformity   NEUROLOGIC:  MENTAL STATUS: awake, alert, language fluent, comprehension intact, naming intact  CRANIAL NERVE: no papilledema on fundoscopic exam, pupils equal and reactive to light, visual fields full to confrontation, extraocular muscles intact, no nystagmus, facial sensation and strength symmetric, uvula midline, shoulder  shrug symmetric, tongue midline.  MOTOR: normal bulk and tone, full strength in the BUE, BLE  SENSORY: normal and symmetric to temperature, vibration; SLIGHTLY DECR PP AND LT IN FEET.  COORDINATION: finger-nose-finger, fine finger movements normal  REFLEXES: deep tendon reflexes present and symmetric  GAIT/STATION: narrow based gait; tandem normal. romberg is negative  DIAGNOSTIC DATA (LABS, IMAGING, TESTING) - I reviewed patient records, labs, notes, testing and imaging myself where available.  Lab Results  Component Value Date   WBC 5.9 10/17/2012   HGB 11.4 10/17/2012   HCT 34.9 10/17/2012   MCV 77* 10/17/2012   PLT 305 10/17/2012      Component Value Date/Time   NA 139 10/17/2012 1355   NA 140 05/27/2012 0938   K 3.5 10/17/2012 1355   CL 103 10/17/2012 1355   CO2 24 10/17/2012 1355   GLUCOSE 89 10/17/2012 1355   GLUCOSE 84 05/27/2012 0938   BUN 9 10/17/2012 1355   BUN 8 05/27/2012 0938   CREATININE 0.71 10/17/2012 1355   CALCIUM 9.1 10/17/2012 1355   PROT 7.7 10/17/2012 1355   PROT 7.4 05/27/2012 0938    ALBUMIN 3.7 05/27/2012 0938   AST 11 10/17/2012 1355   ALT 8 10/17/2012 1355   ALKPHOS 91 10/17/2012 1355   BILITOT 0.2 10/17/2012 1355   GFRNONAA 104 10/17/2012 1355   GFRAA 120 10/17/2012 1355   Lab Results  Component Value Date   CHOL 170 05/27/2012   HDL 53.50 05/27/2012   LDLCALC 103* 05/27/2012   TRIG 68.0 05/27/2012   CHOLHDL 3 05/27/2012   Lab Results  Component Value Date   HGBA1C 6.1* 10/17/2012   Lab Results  Component Value Date   VITAMINB12 161* 10/17/2012   Lab Results  Component Value Date   TSH 2.240 10/17/2012   07/13/12 MRI brain (with and without)  Multiple periventricular, subcortical and pericallosal chronic demyelinating plaques. No acute plaques are seen. Comparison to prior MRI from 11/20/10 could not be done due to inability to access imaging data from prior  10/14/12 MRI brain (with and without)  Abnormal MRI scan of the brain showing multiple periventricular, subcortical white matter hyperintensities suggestive of demyelinating disease. No enhancing lesions are noted. Presence of a few T1 black holes and atrophy of corpus callosum indicates chronic disease. Overall significant progression of white matter disease compared with previous MRI scan dated 12/31/2006. 10/27/12 Carotid Duplex  Negative study.  ASSESSMENT AND PLAN 47 y.o. year old female has a past medical history of Gerd (12/30/2006); Paresthesias (12/30/2006); Multiple sclerosis; Anemia; Hypertension; Headache; Stroke; and Color blind here with multiple sclerosis.  Other MS medications including Gilenya and Tecfidera were discussed at last visit, but she wants to hold off (has difficult time swallowing pills).  Doing well, MUCH improved since last visit.  Continue Avonex. Use cooling vest for the summertime at cooling towel at gym. Continue B12 injections, injection today. Follow up in 6 months.  Ronal Fear, MSN, NP-C 02/03/2013, 2:42 PM [facility] [address], [address] [place], [place]  [address] [phone]

## 2013-02-03 NOTE — Patient Instructions (Signed)
Continue Avonex as prescribed. Use cooling vest for the summertime. Follow up in 6 months.

## 2013-02-03 NOTE — Progress Notes (Signed)
Patient here for a B 12. Under aseptic technique cyancobalamin per 1ml given IM R deltoid. Tolerated well. Band aid applied.

## 2013-02-03 NOTE — Patient Instructions (Signed)
Patient will come next month for b12 injection.

## 2013-02-15 ENCOUNTER — Other Ambulatory Visit: Payer: Self-pay

## 2013-02-15 DIAGNOSIS — Z1231 Encounter for screening mammogram for malignant neoplasm of breast: Secondary | ICD-10-CM

## 2013-02-27 ENCOUNTER — Ambulatory Visit
Admission: RE | Admit: 2013-02-27 | Discharge: 2013-02-27 | Disposition: A | Discharge status: Home / Self Care | Payer: Managed Care, Other (non HMO) | Source: Ambulatory Visit

## 2013-02-27 DIAGNOSIS — Z1231 Encounter for screening mammogram for malignant neoplasm of breast: Secondary | ICD-10-CM

## 2013-03-06 NOTE — Progress Notes (Signed)
I reviewed note and agree with plan.   Suanne Marker, MD 03/06/2013, 10:59 PM Certified in Neurology, Neurophysiology and Neuroimaging  [facility] [address], [address] [place], [place] [address] [phone]

## 2013-03-17 ENCOUNTER — Ambulatory Visit (INDEPENDENT_AMBULATORY_CARE_PROVIDER_SITE_OTHER): Payer: Managed Care, Other (non HMO) | Admitting: Neurology

## 2013-03-17 ENCOUNTER — Ambulatory Visit: Payer: Managed Care, Other (non HMO)

## 2013-03-17 DIAGNOSIS — E538 Deficiency of other specified B group vitamins: Secondary | ICD-10-CM

## 2013-03-17 NOTE — Patient Instructions (Signed)
Patient will return next month for B12 injection. 

## 2013-03-17 NOTE — Progress Notes (Signed)
Patient here for B12 injection.  Under aseptic technique Cyanocobalamin 1000mcg/1ml given IM in left deltoid.  Tolerated well.  Band-Aid applied. 

## 2013-04-03 ENCOUNTER — Ambulatory Visit: Payer: Managed Care, Other (non HMO) | Admitting: Neurology

## 2013-04-04 ENCOUNTER — Telehealth: Payer: Self-pay | Admitting: Diagnostic Neuroimaging

## 2013-04-07 ENCOUNTER — Ambulatory Visit: Payer: Managed Care, Other (non HMO)

## 2013-04-09 ENCOUNTER — Other Ambulatory Visit: Payer: Self-pay

## 2013-04-09 MED ORDER — INTERFERON BETA-1A 30 MCG/0.5ML IM KIT: 30.0000 ug | PACK | INTRAMUSCULAR | Status: DC

## 2013-04-09 NOTE — Telephone Encounter (Signed)
I called and spoke with patient RE: FMLA forms. I completed forms. I will send them for signature. Patient will be in office for treatment on Tuesday. I will fax form and she can pick up a copy while she is here. Patient to sign release of information as well while here.

## 2013-04-11 ENCOUNTER — Telehealth: Payer: Self-pay | Admitting: Neurology

## 2013-04-11 ENCOUNTER — Ambulatory Visit (INDEPENDENT_AMBULATORY_CARE_PROVIDER_SITE_OTHER): Payer: Managed Care, Other (non HMO) | Admitting: Neurology

## 2013-04-11 DIAGNOSIS — E538 Deficiency of other specified B group vitamins: Secondary | ICD-10-CM

## 2013-04-11 DIAGNOSIS — G35 Multiple sclerosis: Secondary | ICD-10-CM

## 2013-04-11 NOTE — Telephone Encounter (Signed)
Patient here today getting her B12 injection.  MS patient said she has been having numbness in feet traveling up to her legs at time.  It has even traveled to her stomach.  It isn't always in her legs but it is always in her feet.  Her husband stated that he noticed her gait was off.    Spoke to Dr. Vickey Huger who ordered one day of Solumedrol 1000mg .

## 2013-04-11 NOTE — Progress Notes (Signed)
Patient here for B12 injection.  Under aseptic technique Cyanocobalamin 1054mcg/1ml given IM in right deltoid.  Tolerated well.  Band-Aid applied.  Patient also had complaints about numbness which I addressed with Dr. Vickey Huger.

## 2013-04-11 NOTE — Patient Instructions (Signed)
Patient will return next month for B12 injection. 

## 2013-04-12 ENCOUNTER — Emergency Department (HOSPITAL_COMMUNITY)
Admission: EM | Admit: 2013-04-12 | Discharge: 2013-04-12 | Disposition: A | Discharge status: Home / Self Care | Payer: Managed Care, Other (non HMO) | Attending: Emergency Medicine | Admitting: Emergency Medicine

## 2013-04-12 ENCOUNTER — Encounter (HOSPITAL_COMMUNITY): Payer: Self-pay | Admitting: Emergency Medicine

## 2013-04-12 ENCOUNTER — Ambulatory Visit (INDEPENDENT_AMBULATORY_CARE_PROVIDER_SITE_OTHER): Payer: Managed Care, Other (non HMO) | Admitting: Neurology

## 2013-04-12 VITALS — BP 135/85 | HR 83 | Temp 97.0°F

## 2013-04-12 DIAGNOSIS — G35D Multiple sclerosis, unspecified: Secondary | ICD-10-CM

## 2013-04-12 DIAGNOSIS — K219 Gastro-esophageal reflux disease without esophagitis: Secondary | ICD-10-CM | POA: Insufficient documentation

## 2013-04-12 DIAGNOSIS — Z880 Allergy status to penicillin: Secondary | ICD-10-CM | POA: Insufficient documentation

## 2013-04-12 DIAGNOSIS — G35 Multiple sclerosis: Secondary | ICD-10-CM

## 2013-04-12 DIAGNOSIS — T7840XA Allergy, unspecified, initial encounter: Secondary | ICD-10-CM

## 2013-04-12 DIAGNOSIS — I10 Essential (primary) hypertension: Secondary | ICD-10-CM | POA: Insufficient documentation

## 2013-04-12 DIAGNOSIS — R21 Rash and other nonspecific skin eruption: Secondary | ICD-10-CM | POA: Insufficient documentation

## 2013-04-12 DIAGNOSIS — Z862 Personal history of diseases of the blood and blood-forming organs and certain disorders involving the immune mechanism: Secondary | ICD-10-CM | POA: Insufficient documentation

## 2013-04-12 DIAGNOSIS — Z8669 Personal history of other diseases of the nervous system and sense organs: Secondary | ICD-10-CM | POA: Insufficient documentation

## 2013-04-12 DIAGNOSIS — T380X5A Adverse effect of glucocorticoids and synthetic analogues, initial encounter: Secondary | ICD-10-CM | POA: Insufficient documentation

## 2013-04-12 DIAGNOSIS — Z8673 Personal history of transient ischemic attack (TIA), and cerebral infarction without residual deficits: Secondary | ICD-10-CM | POA: Insufficient documentation

## 2013-04-12 MED ORDER — DIPHENHYDRAMINE HCL 50 MG/ML IJ SOLN
25.0000 mg | Freq: Once | INTRAMUSCULAR | Status: AC
  Administered 2013-04-12: 25 mg via INTRAVENOUS
  Filled 2013-04-12: qty 1

## 2013-04-12 MED ORDER — SODIUM CHLORIDE 0.9 % IV SOLN
1000.0000 mg | INTRAVENOUS | Status: DC
  Administered 2013-04-12: 1000 mg via INTRAVENOUS

## 2013-04-12 MED ORDER — SODIUM CHLORIDE 0.9 % IV SOLN
600.0000 mg | Freq: Once | INTRAVENOUS | Status: AC
  Administered 2013-04-12: 600 mg via INTRAVENOUS
  Filled 2013-04-12: qty 4.8

## 2013-04-12 MED ORDER — FAMOTIDINE IN NACL 20-0.9 MG/50ML-% IV SOLN
20.0000 mg | Freq: Once | INTRAVENOUS | Status: AC
  Administered 2013-04-12: 20 mg via INTRAVENOUS
  Filled 2013-04-12: qty 50

## 2013-04-12 NOTE — ED Provider Notes (Signed)
CSN: [id number]     Arrival date & time 04/12/13  1011 History   First MD Initiated Contact with Patient 04/12/13 1019     Chief Complaint  Patient presents with  . Allergic Reaction   (Consider location/radiation/quality/duration/timing/severity/associated sxs/prior Treatment) HPI Comments: Patient here from [facility] where she was receiving Solumedrol 1000mg  IV treatment for exacerbation of her MS when she developed severe itching, hives and redness.  They stopped the infusion (she received approximately 1/3 of the dose) and she was immediately given 50mg  of benadryl.  She reports no chest tightness or shortness of breath but did have a deep internal itching sensation.  She reports no pharyngeal edema or sensation of tongue or throat swelling.  She reports mild redness and whelps still to bilateral upper arm.    The history is provided by the patient and the spouse. No language interpreter was used.    Past Medical History  Diagnosis Date  . GERD 12/30/2006  . PARESTHESIA 12/30/2006  . Multiple sclerosis   . Anemia   . Hypertension   . Headache(784.0)   . Stroke   . Color blind     blue/green   Past Surgical History  Procedure Laterality Date  . Abdominal hysterectomy    . Tonsillectomy    . Anal intraepithelial neoplasia excision    . Cesarean section    . Myometomy    . Breast biopsy  10/2009   Family History  Problem Relation Age of Onset  . Stroke Father   . Psychosis Sister   . Hypertension    . Diabetes    . Dementia     History  Substance Use Topics  . Smoking status: Never Smoker   . Smokeless tobacco: Never Used  . Alcohol Use: No   OB History   Grav Para Term Preterm Abortions TAB SAB Ect Mult Living                 Review of Systems  All other systems reviewed and are negative.    Allergies  Banana; Penicillins; Solu-medrol; and Sulfacetamide sodium  Home Medications   Current Outpatient Rx  Name  Route  Sig  Dispense  Refill  .  acetaminophen (TYLENOL) 160 MG/5ML solution   Oral   Take 900 mg by mouth every 6 (six) hours as needed for mild pain.         Marland Kitchen esomeprazole (NEXIUM) 40 MG capsule   Oral   Take 40 mg by mouth daily as needed (for reflux).          . interferon beta-1a (AVONEX PEN) 30 MCG/0.5ML injection   Intramuscular   Inject 0.5 mLs (30 mcg total) into the muscle every 7 (seven) days.   3 kit   1    BP 128/71  Pulse 82  Temp(Src) 98.2 F (36.8 C) (Oral)  Resp 16  SpO2 97% Physical Exam  Nursing note and vitals reviewed. Constitutional: She is oriented to person, place, and time. She appears well-developed and well-nourished. No distress.  HENT:  Head: Normocephalic and atraumatic.  Right Ear: External ear normal.  Left Ear: External ear normal.  Nose: Nose normal.  Mouth/Throat: Oropharynx is clear and moist. No oropharyngeal exudate.  Eyes: Conjunctivae are normal. Pupils are equal, round, and reactive to light. No scleral icterus.  Neck: Normal range of motion. Neck supple.  Cardiovascular: Normal rate, regular rhythm and normal heart sounds.  Exam reveals no gallop and no friction rub.   No murmur  heard. Pulmonary/Chest: Effort normal and breath sounds normal. No stridor. No respiratory distress. She has no wheezes. She has no rales. She exhibits no tenderness.  Abdominal: Soft. Bowel sounds are normal. She exhibits no distension. There is no tenderness.  Musculoskeletal: Normal range of motion. She exhibits no edema and no tenderness.  Neurological: She is alert and oriented to person, place, and time. She exhibits normal muscle tone. Coordination normal.  Skin: Skin is warm and dry. Rash noted. No erythema.  Mild whelps noted to bilateral upper arms and upper back  Psychiatric: She has a normal mood and affect. Her behavior is normal. Judgment and thought content normal.    ED Course  Procedures (including critical care time) Labs Review Labs Reviewed - No data to  display Imaging Review No results found.  EKG Interpretation   None      11:42 AM Patient reports decrease in itching but continues with hive like sensation to bilateral arms.  She now also complains of "lightheadedness" which she relates to her MS.  2:05 PM Continues without symptoms of allergic reaction, will infuse the remainder of the solumedrol (600mg ) IV drip so that she gets the whole amount.  3:56 PM Solumedrol infused, patient reports feeling better - will discharge home with follow up with [facility]. MDM  Allergic Reaction  Patient with a history of MS presents after allergic reaction to solumedrol, which likely is from the preservative in the fluid since another patient has recently had a similar reaction.  Patient without signs now, given solumedrol here and reports no further symptoms.    Izola Price Marisue Humble, PA-C 04/12/13 1558

## 2013-04-12 NOTE — Patient Instructions (Signed)
Patient taken by EMT to ED.

## 2013-04-12 NOTE — Progress Notes (Signed)
Patient here for one treatment of Solumedrol 1000mg  IV.  Patient to treatment room with husband.  IV started in left outer AC, 24g angiocath.  Solumedrol 1000mg /250cc NS started at 0915.  At about 0930 patient started itching both inside and out complained of burning sensation.  IV was stopped.  Had physician come into the room.  Order for Benadryl 50mg  po.  Patient given Benadryl, capsule was emptied into a drink and patient drank.  The patient then complained of getting hives and feeling very cold.  Decision was made to take patient to ED, 911 was called and patient was taken to ED.  The doctor was aware.  Approximately 75-125ml of solumedrol 1000mg /250cc NS was given to patient.  IV was retained and saline locked.

## 2013-04-12 NOTE — ED Notes (Signed)
Pt denies any itchiness/hives reappearing s/p Solu-medrol administration

## 2013-04-12 NOTE — ED Notes (Signed)
Per EMS pt from [facility] with c/o allergic reaction. Pt receiving IV solu-medrol for MS. Pt began to have itching and hives after receiving therapy. Pt given 50 mg PO Benadryl. No hives seen on arrival to ED.

## 2013-04-12 NOTE — ED Notes (Signed)
Contacted pharmacy regarding delay in medication

## 2013-04-12 NOTE — ED Notes (Signed)
VSS. Lung sounds clear, no difficulty breathing.

## 2013-04-12 NOTE — ED Notes (Signed)
Pt reports at MD office for MS flareup, given solu-medrol and began to have itching and hives to arms. EMS called and transported here. No difficulty breathing or chest pain. Lungs clear.

## 2013-04-12 NOTE — ED Notes (Signed)
Pt comfortable with d/c and f/u instructions. No prescriptions 

## 2013-04-12 NOTE — ED Provider Notes (Signed)
Medical screening examination/treatment/procedure(s) were conducted as a shared visit with non-physician practitioner(s) and myself.  I personally evaluated the patient during the encounter.  EKG Interpretation   None      Patient feels much better after treatment for allergic reaction here.  She was given an additional 600 mg of IV Solu-Medrol to complete her 1000 mg dose.  It is likely that she is reacting to some sort of additive to the Solu-Medrol at the neurology office   Lyanne Co, MD 04/12/13 1622

## 2013-04-17 ENCOUNTER — Telehealth: Payer: Self-pay | Admitting: Diagnostic Neuroimaging

## 2013-04-17 NOTE — Telephone Encounter (Signed)
I consulted Dr. Marjory Lies about pt and he recommends pt be seen.  04-19-13 at 0930 is ok per him and pt. (with Heide Guile, NP).

## 2013-04-17 NOTE — Telephone Encounter (Signed)
TC to pt.  She is better, still has constant numbess in her feet.  Intermittent numbness/ heaviness in legs, chest/stonach hugging feeling, tinnitus, tongue tingling.  Per previous notes, allergic reaction to solumedrol?  As per ED note given, steroids given and not problem in ED.  Appt to f/u.  Lam on 04-19-13?

## 2013-04-19 ENCOUNTER — Ambulatory Visit (INDEPENDENT_AMBULATORY_CARE_PROVIDER_SITE_OTHER): Payer: Managed Care, Other (non HMO) | Admitting: Nurse Practitioner

## 2013-04-19 ENCOUNTER — Encounter: Payer: Self-pay | Admitting: Nurse Practitioner

## 2013-04-19 VITALS — BP 129/89 | HR 91 | Ht 65.0 in | Wt 226.0 lb

## 2013-04-19 DIAGNOSIS — E538 Deficiency of other specified B group vitamins: Secondary | ICD-10-CM

## 2013-04-19 DIAGNOSIS — G35 Multiple sclerosis: Secondary | ICD-10-CM

## 2013-04-19 DIAGNOSIS — Z91199 Patient's noncompliance with other medical treatment and regimen due to unspecified reason: Secondary | ICD-10-CM

## 2013-04-19 DIAGNOSIS — Z91148 Patient's other noncompliance with medication regimen for other reason: Secondary | ICD-10-CM

## 2013-04-19 DIAGNOSIS — Z9119 Patient's noncompliance with other medical treatment and regimen: Secondary | ICD-10-CM

## 2013-04-19 DIAGNOSIS — Z9114 Patient's other noncompliance with medication regimen: Secondary | ICD-10-CM

## 2013-04-19 MED ORDER — INTERFERON BETA-1A 30 MCG/0.5ML IM KIT: 30.0000 ug | PACK | INTRAMUSCULAR | Status: DC

## 2013-04-19 NOTE — Progress Notes (Signed)
PATIENT: [name] DOB: February 03, 1966   REASON FOR VISIT: follow up for MS HISTORY FROM: patient  HISTORY OF PRESENT ILLNESS: UPDATE 04/19/13 (LL):   [name] returns for follow up since 1 week ago where she had reaction to Solu-Medrol infusion in our office.  She admits using her Avonex very sporatically leading up to her exacerbation.  She has used her Avonex on Sunday and feels much better today. She states this is the first day in a long time that she can actually feel her feet.  She has been experiencing more fatigue.  She does not want to switch MS treatments now.  She wants to stay on Avonex, and states she will be compliant.  (She has trouble giving herself the shot, even though she states it doesn't even hurt usually.)  04/12/13  Patient here for one treatment of Solumedrol 1000mg  IV. Patient to treatment room with husband. IV started in left outer AC, 24g angiocath. Solumedrol 1000mg /250cc NS started at 0915. At about 0930 patient started itching both inside and out complained of burning sensation. IV was stopped. Had physician come into the room. Order for Benadryl 50mg  po. Patient given Benadryl, capsule was emptied into a drink and patient drank. The patient then complained of getting hives and feeling very cold. Decision was made to take patient to ED, 911 was called and patient was taken to ED. The doctor was aware. Approximately 75-17ml of solumedrol 1000mg /250cc NS was given to patient. IV was retained and saline locked.   UPDATE 02/03/13 (LL): Patient returns for follow up since last visit in June. She states she was extremely sick at last visit and it lasted about 30 days until the weakness and nausea resolved. She is doing very well since, has had some numbness in both feet for the last 2 weeks, but this has been intermittent she states since she was diagnosed with MS. She is tolerating Avonex well, no adverse effects at this time or problems at injection sites.   UPDATE 10/17/12: Since last visit, continues to have increasing lightheadedness, numbness, nausea, headaches. Patient reports remote history of migraine headaches as a teenager. MRI brain reviewed with patient, which shows some progression of white matter disease without focal new plaques or acute demyelinating plaques. Patient has been on Avonex injections for about 3-4 weeks. This is the same duration of time as the new symptoms.  UPDATE 10/04/12: Since last visit, patient developed numbness in the bilateral feet approximately one week ago. Symptoms are starting to gradually improve. Her left foot is more affected than right foot. No symptoms above her ankles. No weakness, bowel or bladder dysfunction, upper extremity problems, vision changes, slurred speech, trouble talking.  UPDATE 06/24/12: Since last visit, patient discontinued avonex in Nov 2013 (flu-like sxs, didn't feel like taking it). No flares. In Summer 2013, went to ER for chills, lightheadedness, and almost passing out. Husband asking about oral meds for MS.  UPDATE 08/05/11 (Dr. Sandria Manly): She has a positive Lhermitte's sign. Her symptoms resolved. She discontinued Avonex 05/2011 related to financial concerns. She was not having side effects from Avonex. She plans on restarting the medication.  DATABASE: Female with a history of obesity, migraine headaches who was diagnosed as having relapsing and remitting multiple sclerosis 12/2006 by Dr. Kelli Hope with transverse myelitis. At that time she had symptoms of right and left leg numbness that extended to her hips and then subsequently to her chest. She also had upper extremity involvement. MRI study of  the brain 12/31/06 showed advanced white matter disease In the periventricular region diffusely extending towards the occipital lobes. DWI was negative and there was no enhancement with contrast. MRI of the cervical spine showed abnormal T2 signal from the inferior endplate of C2 to the superior endplate  of C4. There was a small focus of enhancement, measuring 5 mm in the cord. The cord was slightly swollen and edematous in that location. Laboratory studies were normal B12, CMP, CBC, sedimentation rate, HIV, ANA, ACE, HTLV-1/2, RPR, and TSH. She was treated with high dose IV steroids and placed in the stratify study with JC virus anybody that was positive. She began Avonex 03/2007 and did well except for a recurrent attack in April 2012 treated with 3 days of IV Solu-Medrol without a po taper. MRI studies 11/2010 showed bilateral periventricular subcortical, corpus callosum, left temporal white matter lesions consistent with demyelinating disease and tiny enhancing lesions were noted in the right cerebellum and left medial temporal lobes suggestive of progressive disease. MRI of cervical spine showed subtle or hyperintensities in C2 and C4.   REVIEW OF SYSTEMS: Full 14 system review of systems performed and notable only for fatigue, ringing in ears, anemia, numbness, weakness, dizziness (some), restless legs.  ALLERGIES: Allergies  Allergen Reactions  . Banana   . Penicillins   . Sulfacetamide Sodium     HOME MEDICATIONS: Outpatient Prescriptions Prior to Visit  Medication Sig Dispense Refill  . acetaminophen (TYLENOL) 160 MG/5ML solution Take 900 mg by mouth every 6 (six) hours as needed for mild pain.      Marland Kitchen esomeprazole (NEXIUM) 40 MG capsule Take 40 mg by mouth daily as needed (for reflux).       . interferon beta-1a (AVONEX PEN) 30 MCG/0.5ML injection Inject 0.5 mLs (30 mcg total) into the muscle every 7 (seven) days.  3 kit  1   Facility-Administered Medications Prior to Visit  Medication Dose Route Frequency Provider Last Rate Last Dose  . cyanocobalamin ((VITAMIN B-12)) injection 1,000 mcg  1,000 mcg Intramuscular Q30 days Suanne Marker, MD   1,000 mcg at 04/11/13 1630  . methylPREDNISolone sodium succinate (SOLU-MEDROL) 1,000 mg in sodium chloride 0.9 % 100 mL IVPB  1,000 mg  Intravenous Continuous Melvyn Novas, MD   1,000 mg at 04/12/13 0915    PAST MEDICAL HISTORY: Past Medical History  Diagnosis Date  . GERD 12/30/2006  . PARESTHESIA 12/30/2006  . Multiple sclerosis   . Anemia   . Hypertension   . Headache(784.0)   . Stroke   . Color blind     blue/green    PAST SURGICAL HISTORY: Past Surgical History  Procedure Laterality Date  . Abdominal hysterectomy    . Tonsillectomy    . Anal intraepithelial neoplasia excision    . Cesarean section    . Myometomy    . Breast biopsy  10/2009    FAMILY HISTORY: Family History  Problem Relation Age of Onset  . Stroke Father   . Psychosis Sister   . Hypertension    . Diabetes    . Dementia      SOCIAL HISTORY: History   Social History  . Marital Status: Married    Spouse Name: Chrissie Noa    Number of Children: 0  . Years of Education: College   Occupational History  .  [employer]  . RESEARCH ANALYST [employer]   Social History Main Topics  . Smoking status: Never Smoker   . Smokeless tobacco: Never  Used  . Alcohol Use: No  . Drug Use: No  . Sexual Activity: Not on file   Other Topics Concern  . Not on file   Social History Narrative   Pt lives at home family.   Caffeine Use: 2 sodas daily     PHYSICAL EXAM  Filed Vitals:   04/19/13 0935  BP: 129/89  Pulse: 91  Height: 5\' 5"  (1.651 m)  Weight: 226 lb (102.513 kg)   Body mass index is 37.61 kg/(m^2).  Generalized: Well developed, in no acute distress, obese AA female Head: normocephalic and atraumatic. Oropharynx benign  Neck: Supple, no carotid bruits  Cardiac: Regular rate rhythm, no murmur  Musculoskeletal: No deformity   Neurological examination  Mentation: Alert oriented to time, place, history taking. Follows all commands speech and language fluent Cranial nerve II-XII: Pupils were equal round reactive to light extraocular movements were full, visual field were full on confrontational test. Facial  sensation and strength were normal. hearing was intact to finger rubbing bilaterally. Uvula tongue midline. head turning and shoulder shrug and were normal and symmetric.Tongue protrusion into cheek strength was normal. Motor: normal bulk and tone, full strength in the BUE, BLE, fine finger movements normal, no pronator drift. No focal weakness Sensory: normal and symmetric to light touch, pinprick, and  vibration SLIGHTLY DECR PP AND LT IN FEET.  Coordination: finger-nose-finger, heel-to-shin bilaterally, no dysmetria Reflexes:  Deep tendon reflexes in the upper and lower extremities are present and symmetric.  Gait and Station: Rising up from seated position without assistance, normal stance, without trunk ataxia, moderate stride, good arm swing, smooth turning, able to perform tiptoe, and heel walking without difficulty.   DIAGNOSTIC DATA (LABS, IMAGING, TESTING) - I reviewed patient records, labs, notes, testing and imaging myself where available.  Lab Results  Component Value Date   WBC 5.9 10/17/2012   HGB 11.4 10/17/2012   HCT 34.9 10/17/2012   MCV 77* 10/17/2012   PLT 305 10/17/2012      Component Value Date/Time   NA 139 10/17/2012 1355   NA 140 05/27/2012 0938   K 3.5 10/17/2012 1355   CL 103 10/17/2012 1355   CO2 24 10/17/2012 1355   GLUCOSE 89 10/17/2012 1355   GLUCOSE 84 05/27/2012 0938   BUN 9 10/17/2012 1355   BUN 8 05/27/2012 0938   CREATININE 0.71 10/17/2012 1355   CALCIUM 9.1 10/17/2012 1355   PROT 7.7 10/17/2012 1355   PROT 7.4 05/27/2012 0938   ALBUMIN 3.7 05/27/2012 0938   AST 11 10/17/2012 1355   ALT 8 10/17/2012 1355   ALKPHOS 91 10/17/2012 1355   BILITOT 0.2 10/17/2012 1355   GFRNONAA 104 10/17/2012 1355   GFRAA 120 10/17/2012 1355   Lab Results  Component Value Date   CHOL 170 05/27/2012   HDL 53.50 05/27/2012   LDLCALC 103* 05/27/2012   TRIG 68.0 05/27/2012   CHOLHDL 3 05/27/2012   Lab Results  Component Value Date   HGBA1C 6.1* 10/17/2012   Lab Results  Component Value Date   VITAMINB12  161* 10/17/2012   Lab Results  Component Value Date   TSH 2.240 10/17/2012   Lab Results  Component Value Date   ESRSEDRATE 20 12/31/2006   07/13/12 MRI brain (with and without)  Multiple periventricular, subcortical and pericallosal chronic demyelinating plaques. No acute plaques are seen. Comparison to prior MRI from 11/20/10 could not be done due to inability to access imaging data from prior  10/14/12 MRI brain (with and  without)  Abnormal MRI scan of the brain showing multiple periventricular, subcortical white matter hyperintensities suggestive of demyelinating disease. No enhancing lesions are noted. Presence of a few T1 black holes and atrophy of corpus callosum indicates chronic disease. Overall significant progression of white matter disease compared with previous MRI scan dated 12/31/2006.  10/27/12 Carotid Duplex  Negative study.   ASSESSMENT AND PLAN 47 y.o. year old female has a past medical history of Gerd (12/30/2006); Paresthesias (12/30/2006); Multiple sclerosis; Anemia; Hypertension; Headache; Stroke; and Color blind here with multiple sclerosis. Other MS medications including Gilenya and Tecfidera were discussed at last visit, but she wants to hold off (has difficult time swallowing pills). Doing better, admitted non-compliance with medication which led to last exacerbation.  Wants to continue Avonex, she commits to doing better with consistency of injections.  PLAN: Continue Avonex.  Continue B12 injections. Return in about 6 months (around 10/18/2013).  Ronal Fear, MSN, NP-C 04/19/2013, 10:27 AM [facility] [address] [place], [place] [address] [phone]  Note: This document was prepared with digital dictation and possible smart phrase technology. Any transcriptional errors that result from this process are unintentional.

## 2013-04-19 NOTE — Patient Instructions (Signed)
Continue Avoxnex, once weekly dose, work on compliance, taking it same day each week.  Follow up in 6 months, with Dr. Marjory Lies or Larita Fife, NP

## 2013-04-27 ENCOUNTER — Telehealth: Payer: Self-pay | Admitting: Internal Medicine

## 2013-04-27 NOTE — Telephone Encounter (Signed)
done

## 2013-04-27 NOTE — Telephone Encounter (Signed)
Pt needs cpe before 06/18/13 for insurance purposes. Must be after jan 17 but before 06/18/13. pls advise Pt aware of b12 instructions. Will discuss at cpe.

## 2013-04-27 NOTE — Telephone Encounter (Signed)
Pt has been getting her b12 at her [facility] and would like to start getting those here. is that OK?

## 2013-04-27 NOTE — Telephone Encounter (Signed)
Pt is already scheduled for CPE and labs for Feb, if need to reschedule that is fine.

## 2013-04-27 NOTE — Telephone Encounter (Signed)
Pt needs to continue getting B 12 injections with Neurology they are the ones who ordered it and are following her for it. She can discuss it at visit in Feb with Dr. Kirtland Bouchard if he would order and follow.

## 2013-05-12 ENCOUNTER — Ambulatory Visit: Payer: Managed Care, Other (non HMO)

## 2013-05-24 ENCOUNTER — Ambulatory Visit: Payer: Managed Care, Other (non HMO) | Admitting: Diagnostic Neuroimaging

## 2013-05-30 ENCOUNTER — Other Ambulatory Visit (INDEPENDENT_AMBULATORY_CARE_PROVIDER_SITE_OTHER): Payer: Managed Care, Other (non HMO)

## 2013-05-30 DIAGNOSIS — Z0000 Encounter for general adult medical examination without abnormal findings: Secondary | ICD-10-CM

## 2013-05-30 LAB — CBC WITH DIFFERENTIAL/PLATELET
BASOS ABS: 0 10*3/uL (ref 0.0–0.1)
Basophils Relative: 0.2 % (ref 0.0–3.0)
EOS ABS: 0.1 10*3/uL (ref 0.0–0.7)
EOS PCT: 1 % (ref 0.0–5.0)
HEMATOCRIT: 28 % — AB (ref 36.0–46.0)
Hemoglobin: 8.7 g/dL — ABNORMAL LOW (ref 12.0–15.0)
LYMPHS ABS: 1.6 10*3/uL (ref 0.7–4.0)
Lymphocytes Relative: 27.7 % (ref 12.0–46.0)
MCHC: 31.1 g/dL (ref 30.0–36.0)
Monocytes Absolute: 0.6 10*3/uL (ref 0.1–1.0)
Monocytes Relative: 9.7 % (ref 3.0–12.0)
NEUTROS PCT: 61.4 % (ref 43.0–77.0)
Neutro Abs: 3.6 10*3/uL (ref 1.4–7.7)
Platelets: 275 10*3/uL (ref 150.0–400.0)
RBC: 4 Mil/uL (ref 3.87–5.11)
RDW: 18.3 % — AB (ref 11.5–14.6)
WBC: 5.9 10*3/uL (ref 4.5–10.5)

## 2013-05-30 LAB — BASIC METABOLIC PANEL
BUN: 11 mg/dL (ref 6–23)
CALCIUM: 8.8 mg/dL (ref 8.4–10.5)
CO2: 23 mEq/L (ref 19–32)
CREATININE: 0.7 mg/dL (ref 0.4–1.2)
Chloride: 108 mEq/L (ref 96–112)
GFR: 114.42 mL/min (ref >60.00)
Glucose, Bld: 84 mg/dL (ref 70–99)
Potassium: 3.7 mEq/L (ref 3.5–5.1)
SODIUM: 139 meq/L (ref 135–145)

## 2013-05-30 LAB — LIPID PANEL
Cholesterol: 166 mg/dL (ref 0–200)
HDL: 51.8 mg/dL (ref >39.00)
LDL Cholesterol: 103 mg/dL — ABNORMAL HIGH (ref 0–99)
TRIGLYCERIDES: 55 mg/dL (ref 0.0–149.0)
Total CHOL/HDL Ratio: 3
VLDL: 11 mg/dL (ref 0.0–40.0)

## 2013-05-30 LAB — HEPATIC FUNCTION PANEL
ALK PHOS: 68 U/L (ref 39–117)
ALT: 9 U/L (ref 0–35)
AST: 15 U/L (ref 0–37)
Albumin: 3.7 g/dL (ref 3.5–5.2)
Bilirubin, Direct: 0.1 mg/dL (ref 0.0–0.3)
TOTAL PROTEIN: 7.6 g/dL (ref 6.0–8.3)
Total Bilirubin: 0.4 mg/dL (ref 0.3–1.2)

## 2013-05-30 LAB — POCT URINALYSIS DIPSTICK
Bilirubin, UA: NEGATIVE
Blood, UA: NEGATIVE
Glucose, UA: NEGATIVE
KETONES UA: NEGATIVE
LEUKOCYTES UA: NEGATIVE
Nitrite, UA: NEGATIVE
Spec Grav, UA: >=1.03
UROBILINOGEN UA: 0.2
pH, UA: 5.5

## 2013-05-30 LAB — TSH: TSH: 2.08 u[IU]/mL (ref 0.35–5.50)

## 2013-05-31 ENCOUNTER — Ambulatory Visit (INDEPENDENT_AMBULATORY_CARE_PROVIDER_SITE_OTHER): Payer: Managed Care, Other (non HMO) | Admitting: Internal Medicine

## 2013-05-31 ENCOUNTER — Ambulatory Visit
Admission: RE | Admit: 2013-05-31 | Discharge: 2013-05-31 | Disposition: A | Discharge status: Home / Self Care | Payer: Managed Care, Other (non HMO) | Source: Ambulatory Visit | Attending: Internal Medicine | Admitting: Internal Medicine

## 2013-05-31 ENCOUNTER — Ambulatory Visit (INDEPENDENT_AMBULATORY_CARE_PROVIDER_SITE_OTHER)
Admission: RE | Admit: 2013-05-31 | Discharge: 2013-05-31 | Disposition: A | Discharge status: Home / Self Care | Payer: Managed Care, Other (non HMO) | Source: Ambulatory Visit | Attending: Internal Medicine | Admitting: Internal Medicine

## 2013-05-31 ENCOUNTER — Encounter: Payer: Self-pay | Admitting: Internal Medicine

## 2013-05-31 VITALS — BP 130/80 | HR 77 | Temp 98.0°F | Resp 20 | Ht 65.0 in | Wt 226.0 lb

## 2013-05-31 DIAGNOSIS — M25551 Pain in right hip: Secondary | ICD-10-CM

## 2013-05-31 DIAGNOSIS — M25559 Pain in unspecified hip: Secondary | ICD-10-CM

## 2013-05-31 MED ORDER — HYDROCODONE-ACETAMINOPHEN 5-325 MG PO TABS: 1.0000 | ORAL_TABLET | Freq: Four times a day (QID) | ORAL | Status: DC | PRN

## 2013-05-31 NOTE — Patient Instructions (Signed)
You  may move around, but avoid painful motions and activities.  Apply ice to the sore area for 15 to 20 minutes 3 or 4 times daily for the next two to 3 days.  Take pain medications as directed  X-rays as discussed

## 2013-05-31 NOTE — Progress Notes (Signed)
Pre-visit discussion using our clinic review tool. No additional management support is needed unless otherwise documented below in the visit note.  

## 2013-05-31 NOTE — Progress Notes (Signed)
   Subjective:    Patient ID: [name], female    DOB: 06/04/1965, 48 y.o.   MRN: [id number]  HPI  48 year old patient who slipped on ice earlier today and slided  down 7 steps. She complains of left shoulder back and especially right hip pain. She walks with a limp.  Past Medical History  Diagnosis Date  . GERD 12/30/2006  . PARESTHESIA 12/30/2006  . Multiple sclerosis   . Anemia   . Hypertension   . Headache(784.0)   . Stroke   . Color blind     blue/green    History   Social History  . Marital Status: Married    Spouse Name: [name]    Number of Children: 0  . Years of Education: College   Occupational History  .  [employer]  . 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 Smoking status: Never Smoker   . Smokeless tobacco: Never Used  . Alcohol Use: No  . Drug Use: No  . Sexual Activity: Not on file   Other Topics Concern  . Not on file   Social History Narrative   Pt lives at home family.   Caffeine Use: 2 sodas daily    Past Surgical History  Procedure Laterality Date  . Abdominal hysterectomy    . Tonsillectomy    . Anal intraepithelial neoplasia excision    . Cesarean section    . Myometomy    . Breast biopsy  10/2009    Family History  Problem Relation Age of Onset  . Stroke Father   . Psychosis Sister   . Hypertension    . Diabetes    . Dementia      Allergies  Allergen Reactions  . Banana   . Penicillins   . Sulfacetamide Sodium     Current Outpatient Prescriptions on File Prior to Visit  Medication Sig Dispense Refill  . acetaminophen (TYLENOL) 160 MG/5ML solution Take 900 mg by mouth every 6 (six) hours as needed for mild pain.      Marland Kitchen esomeprazole (NEXIUM) 40 MG capsule Take 40 mg by mouth daily as needed (for reflux).       . interferon beta-1a (AVONEX PEN) 30 MCG/0.5ML injection Inject 0.5 mLs (30 mcg total) into the muscle every 7 (seven) days.  3 kit  6   No current facility-administered  medications on file prior to visit.    BP 130/80  Pulse 77  Temp(Src) 98 F (36.7 C) (Oral)  Resp 20  Ht _0  (1.651 m)  Wt 226 lb (102.513 kg)  BMI 37.61 kg/m2  SpO2 98%       Review of Systems  Musculoskeletal: Positive for back pain and gait problem.       Objective:   Physical Exam  Constitutional: She appears well-developed and well-nourished. No distress.  Musculoskeletal:  Walks with a pronounced limp due to right hip pain  Quite painful with range of motion of the right hip          Assessment & Plan:   Fall rule out pelvic or right hip fracture. We'll treat symptomatically. Will apply ice for the next 24 hours radiographs will be reviewed

## 2013-06-01 ENCOUNTER — Telehealth: Payer: Self-pay | Admitting: Internal Medicine

## 2013-06-01 DIAGNOSIS — M25551 Pain in right hip: Secondary | ICD-10-CM

## 2013-06-01 NOTE — Telephone Encounter (Signed)
Pt calling for xray results. Please advise. 

## 2013-06-01 NOTE — Telephone Encounter (Signed)
Spoke to pt told her right hip x-ray was normal. Asked pt how she is feeling Pt stated she is feeling a little better, still has twinge in pelvic area and is still unable to lift leg up. Told her okay will let Dr. Raliegh Ip know and if he decides to order x-ray of pelvis will contact tomorrow. Pt verbalized understanding.

## 2013-06-01 NOTE — Telephone Encounter (Signed)
Patient Information:  Caller Name: Nicolemarie  Phone: [phone]  Patient: [name]  Gender: Female  DOB: Oct 06, 1965  Age: 48 Years  PCP: Bluford Kaufmann (Family Practice > 83yrs old)  Pregnant: No  Office Follow Up:  Does the office need to follow up with this patient?: Yes  Instructions For The Office: Please f/u with pt concerning Xrays dated 05/31/13, thank you.   Symptoms  Reason For Call & Symptoms: Pt calling to inquire if the pelvic Xray was received today.  Pt reports she knows the hip xray was received.  Reviewed Health History In EMR: N/A  Reviewed Medications In EMR: N/A  Reviewed Allergies In EMR: N/A  Reviewed Surgeries / Procedures: N/A  Date of Onset of Symptoms: 05/31/2013 OB / GYN:  LMP: Unknown  Guideline(s) Used:  No Protocol Available - Information Only  Disposition Per Guideline:   Discuss with PCP and Callback by Nurse Today  Reason For Disposition Reached:   Nursing judgment  Advice Given:  Call Back If:  New symptoms develop  You become worse.  Patient Will Follow Care Advice:  YES

## 2013-06-01 NOTE — Telephone Encounter (Signed)
Please call and check on her status. X-rays the right hip normal but x-rays of the pelvic area not performed. We'll consider reordering pelvic x-rays tomorrow if patient unimproved

## 2013-06-01 NOTE — Telephone Encounter (Signed)
Dr. Raliegh Ip, please see message below regarding pt and advise if want Pelvis x-ray?

## 2013-06-02 NOTE — Telephone Encounter (Signed)
Spoke to pt told her order in for Pelvic x-ray need to got today to have done at [place] office. Pt verbalized understanding.

## 2013-06-02 NOTE — Telephone Encounter (Signed)
schedule patient for pelvic x-rays at the [place]  office today

## 2013-06-06 ENCOUNTER — Encounter: Payer: Self-pay | Admitting: Internal Medicine

## 2013-06-06 ENCOUNTER — Ambulatory Visit (INDEPENDENT_AMBULATORY_CARE_PROVIDER_SITE_OTHER): Payer: Managed Care, Other (non HMO) | Admitting: Internal Medicine

## 2013-06-06 VITALS — BP 140/80 | HR 73 | Temp 99.1°F | Resp 20 | Ht 63.5 in | Wt 223.0 lb

## 2013-06-06 DIAGNOSIS — Z0000 Encounter for general adult medical examination without abnormal findings: Secondary | ICD-10-CM

## 2013-06-06 DIAGNOSIS — E538 Deficiency of other specified B group vitamins: Secondary | ICD-10-CM

## 2013-06-06 DIAGNOSIS — G35 Multiple sclerosis: Secondary | ICD-10-CM

## 2013-06-06 MED ORDER — CYANOCOBALAMIN 1000 MCG/ML IJ SOLN: 1000.0000 ug | INTRAMUSCULAR | Status: DC

## 2013-06-06 MED ORDER — "SYRINGE/NEEDLE (DISP) 25G X 1"" 3 ML MISC": 1.0000 | Status: DC

## 2013-06-06 MED ORDER — CYANOCOBALAMIN 1000 MCG/ML IJ SOLN
1000.0000 ug | Freq: Once | INTRAMUSCULAR | Status: AC
  Administered 2013-06-06: 1000 ug via INTRAMUSCULAR

## 2013-06-06 NOTE — Progress Notes (Signed)
Subjective:    Patient ID: [name], female    DOB: 02-Oct-1965, 48 y.o.   MRN: [id number]  HPI   48 year old patient who is seen today for a preventive health examination. She has a history of MS and is followed by neurology. She states that she was diagnosed with B12 deficiency in June of last year and has been on monthly B12 injections. Complaints include chronic fatigue and some paresthesias of the lower extremity. She does admit to having more frequent periods. She has seen gynecology in the past and has had hormonal manipulation to control the menorrhagia  Past Medical History  Diagnosis Date  . GERD 12/30/2006  . PARESTHESIA 12/30/2006  . Multiple sclerosis   . Anemia   . Hypertension   . Headache(784.0)   . Stroke   . Color blind     blue/green    History   Social History  . Marital Status: Married    Spouse Name: [name]    Number of Children: 0  . Years of Education: College   Occupational History  .  [employer]  . 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 Smoking status: Never Smoker   . Smokeless tobacco: Never Used  . Alcohol Use: No  . Drug Use: No  . Sexual Activity: Not on file   Other Topics Concern  . Not on file   Social History Narrative   Pt lives at home family.   Caffeine Use: 2 sodas daily    Past Surgical History  Procedure Laterality Date  . Abdominal hysterectomy    . Tonsillectomy    . Anal intraepithelial neoplasia excision    . Cesarean section    . Myometomy    . Breast biopsy  10/2009    Family History  Problem Relation Age of Onset  . Stroke Father   . Psychosis Sister   . Hypertension    . Diabetes    . Dementia      Allergies  Allergen Reactions  . Banana   . Penicillins   . Sulfacetamide Sodium     Current Outpatient Prescriptions on File Prior to Visit  Medication Sig Dispense Refill  . acetaminophen (TYLENOL) 160 MG/5ML solution Take 900 mg by mouth every 6 (six)  hours as needed for mild pain.      Marland Kitchen esomeprazole (NEXIUM) 40 MG capsule Take 40 mg by mouth daily as needed (for reflux).       . interferon beta-1a (AVONEX PEN) 30 MCG/0.5ML injection Inject 0.5 mLs (30 mcg total) into the muscle every 7 (seven) days.  3 kit  6   No current facility-administered medications on file prior to visit.    BP 140/80  Pulse 73  Temp(Src) 99.1 F (37.3 C) (Oral)  Resp 20  Ht 5' 3.5" (1.613 m)  Wt 223 lb (101.152 kg)  BMI 38.88 kg/m2  SpO2 98%  LMP 05/24/2013       Review of Systems  Constitutional: Positive for fatigue.  HENT: Negative for congestion, dental problem, hearing loss, rhinorrhea, sinus pressure, sore throat and tinnitus.   Eyes: Negative for pain, discharge and visual disturbance.  Respiratory: Negative for cough and shortness of breath.   Cardiovascular: Negative for chest pain, palpitations and leg swelling.  Gastrointestinal: Negative for nausea, vomiting, abdominal pain, diarrhea, constipation, blood in stool and abdominal distention.  Genitourinary: Positive for vaginal bleeding. Negative for dysuria, urgency, frequency, hematuria, flank pain, vaginal discharge, difficulty  urinating, vaginal pain and pelvic pain.  Musculoskeletal: Negative for arthralgias, gait problem and joint swelling.  Skin: Negative for rash.  Neurological: Positive for weakness and numbness. Negative for dizziness, syncope, speech difficulty and headaches.  Hematological: Negative for adenopathy.  Psychiatric/Behavioral: Negative for behavioral problems, dysphoric mood and agitation. The patient is not nervous/anxious.        Objective:   Physical Exam  Constitutional: She is oriented to person, place, and time. She appears well-developed and well-nourished.  Overweight  HENT:  Head: Normocephalic.  Right Ear: External ear normal.  Left Ear: External ear normal.  Mouth/Throat: Oropharynx is clear and moist.  Eyes: Conjunctivae and EOM are normal.  Pupils are equal, round, and reactive to light.  Neck: Normal range of motion. Neck supple. No thyromegaly present.  Cardiovascular: Normal rate, regular rhythm, normal heart sounds and intact distal pulses.   Pulmonary/Chest: Effort normal and breath sounds normal.  Abdominal: Soft. Bowel sounds are normal. She exhibits no mass. There is no tenderness.  Musculoskeletal: Normal range of motion.  Lymphadenopathy:    She has no cervical adenopathy.  Neurological: She is alert and oriented to person, place, and time.  Skin: Skin is warm and dry. No rash noted.  Psychiatric: She has a normal mood and affect. Her behavior is normal.          Assessment & Plan:   Preventive health examination MS Menorrhagia. We'll follow up with GYN. We'll place on iron twice daily. Followup CBC in 6 weeks Iron deficiency anemia B12 deficiency. The patient wishes to begin monthly home IM injections. Patient instructed

## 2013-06-06 NOTE — Patient Instructions (Signed)
Monthly B12 injections    It is important that you exercise regularly, at least 20 minutes 3 to 4 times per week.  If you develop chest pain or shortness of breath seek  medical attention.  You need to lose weight.  Consider a lower calorie diet and regular exercise.  Return in 6 weeks  for follow-up  Take an iron supplement twice daily

## 2013-06-06 NOTE — Progress Notes (Signed)
Pre-visit discussion using our clinic review tool. No additional management support is needed unless otherwise documented below in the visit note.  

## 2013-06-14 ENCOUNTER — Telehealth: Payer: Self-pay | Admitting: Internal Medicine

## 2013-06-14 NOTE — Telephone Encounter (Signed)
Pt has appt on thurs at 1pm  1/29 . Did you refer pt to Dr Doy Mince at [facility]?  pls advise

## 2013-06-14 NOTE — Telephone Encounter (Signed)
Dr Doy Mince office would like info concerning this pt for referral we did to him on this patient. Fax  [phone] [facility]  I do not see a referral for this pt. Called dr for confirmation 1.28 @ 12:00

## 2013-06-15 DIAGNOSIS — Z0289 Encounter for other administrative examinations: Secondary | ICD-10-CM

## 2013-06-15 NOTE — Telephone Encounter (Signed)
Spoke to pt asked her if Dr. Doy Mince office got a hold of her? Pt said yes, she spoke to them yesterday and she has all her records for Dr. Doy Mince. Told her okay just wanted to make sure you didn't need anything from [place]. Pt said no everything is good. Told her okay.

## 2013-06-23 ENCOUNTER — Other Ambulatory Visit: Payer: Managed Care, Other (non HMO)

## 2013-06-29 ENCOUNTER — Encounter: Payer: Managed Care, Other (non HMO) | Admitting: Internal Medicine

## 2013-07-18 ENCOUNTER — Telehealth: Payer: Self-pay | Admitting: Internal Medicine

## 2013-07-18 NOTE — Telephone Encounter (Signed)
Pt's husband is calling regarding pt medication, pt is currently needed to take iron pills per dr.k however the pt can not swallow the pills and wants to know if it is ok to take ferrous sulfate elixir.

## 2013-07-18 NOTE — Telephone Encounter (Signed)
Spoke to pt's husband [name], told him okay to take liquid iron if that is what she needs to do. [name] said pt has trouble swallowing pills, even small ones. Told him okay.

## 2013-07-19 NOTE — Telephone Encounter (Signed)
Closing encounter

## 2013-08-04 ENCOUNTER — Ambulatory Visit: Payer: Managed Care, Other (non HMO) | Admitting: Nurse Practitioner

## 2013-10-18 ENCOUNTER — Ambulatory Visit: Payer: Managed Care, Other (non HMO) | Admitting: Nurse Practitioner

## 2014-02-21 ENCOUNTER — Other Ambulatory Visit: Payer: Self-pay

## 2014-02-21 DIAGNOSIS — Z1239 Encounter for other screening for malignant neoplasm of breast: Secondary | ICD-10-CM

## 2014-03-02 ENCOUNTER — Ambulatory Visit: Payer: Managed Care, Other (non HMO)

## 2014-04-06 ENCOUNTER — Other Ambulatory Visit: Payer: Self-pay

## 2014-04-06 DIAGNOSIS — Z1231 Encounter for screening mammogram for malignant neoplasm of breast: Secondary | ICD-10-CM

## 2014-04-10 ENCOUNTER — Ambulatory Visit: Payer: Managed Care, Other (non HMO)

## 2014-05-09 ENCOUNTER — Telehealth: Payer: Self-pay | Admitting: Internal Medicine

## 2014-05-09 NOTE — Telephone Encounter (Signed)
Pt would like to know if dr Raliegh Ip would approve her for a handicap sticker? Pt has ms dr, but he is in [place]. pls advise.

## 2014-05-10 NOTE — Telephone Encounter (Signed)
Please see message and advise 

## 2014-05-10 NOTE — Telephone Encounter (Signed)
yes

## 2014-05-10 NOTE — Telephone Encounter (Signed)
Spoke to pt told her Dr. Raliegh Ip will give her a paper for Handicapped card, will fill out his part and have at the front desk for pickup.  Pt verbalized understanding.

## 2014-06-27 ENCOUNTER — Observation Stay (HOSPITAL_COMMUNITY): Payer: BLUE CROSS/BLUE SHIELD

## 2014-06-27 ENCOUNTER — Encounter (HOSPITAL_COMMUNITY): Payer: Self-pay | Admitting: Emergency Medicine

## 2014-06-27 ENCOUNTER — Emergency Department (HOSPITAL_COMMUNITY): Payer: BLUE CROSS/BLUE SHIELD

## 2014-06-27 ENCOUNTER — Inpatient Hospital Stay (HOSPITAL_COMMUNITY)
Admission: EM | Admit: 2014-06-27 | Discharge: 2014-06-30 | DRG: 060 | Disposition: A | Discharge status: Home / Self Care | Payer: BLUE CROSS/BLUE SHIELD | Attending: Internal Medicine | Admitting: Internal Medicine

## 2014-06-27 DIAGNOSIS — I10 Essential (primary) hypertension: Secondary | ICD-10-CM | POA: Diagnosis present

## 2014-06-27 DIAGNOSIS — R079 Chest pain, unspecified: Secondary | ICD-10-CM

## 2014-06-27 DIAGNOSIS — Z882 Allergy status to sulfonamides status: Secondary | ICD-10-CM

## 2014-06-27 DIAGNOSIS — K219 Gastro-esophageal reflux disease without esophagitis: Secondary | ICD-10-CM | POA: Diagnosis present

## 2014-06-27 DIAGNOSIS — Z66 Do not resuscitate: Secondary | ICD-10-CM | POA: Diagnosis present

## 2014-06-27 DIAGNOSIS — G35 Multiple sclerosis: Principal | ICD-10-CM

## 2014-06-27 DIAGNOSIS — Z9119 Patient's noncompliance with other medical treatment and regimen: Secondary | ICD-10-CM | POA: Diagnosis present

## 2014-06-27 DIAGNOSIS — Z90710 Acquired absence of both cervix and uterus: Secondary | ICD-10-CM

## 2014-06-27 DIAGNOSIS — D509 Iron deficiency anemia, unspecified: Secondary | ICD-10-CM | POA: Diagnosis present

## 2014-06-27 DIAGNOSIS — E669 Obesity, unspecified: Secondary | ICD-10-CM | POA: Diagnosis present

## 2014-06-27 DIAGNOSIS — Z6838 Body mass index (BMI) 38.0-38.9, adult: Secondary | ICD-10-CM

## 2014-06-27 DIAGNOSIS — Z880 Allergy status to penicillin: Secondary | ICD-10-CM

## 2014-06-27 DIAGNOSIS — D539 Nutritional anemia, unspecified: Secondary | ICD-10-CM | POA: Diagnosis present

## 2014-06-27 DIAGNOSIS — H5350 Unspecified color vision deficiencies: Secondary | ICD-10-CM | POA: Diagnosis present

## 2014-06-27 DIAGNOSIS — E538 Deficiency of other specified B group vitamins: Secondary | ICD-10-CM | POA: Diagnosis present

## 2014-06-27 DIAGNOSIS — Z8673 Personal history of transient ischemic attack (TIA), and cerebral infarction without residual deficits: Secondary | ICD-10-CM

## 2014-06-27 DIAGNOSIS — Z91018 Allergy to other foods: Secondary | ICD-10-CM

## 2014-06-27 DIAGNOSIS — R531 Weakness: Secondary | ICD-10-CM

## 2014-06-27 LAB — BASIC METABOLIC PANEL
ANION GAP: 7 (ref 5–15)
BUN: 7 mg/dL (ref 6–23)
CO2: 23 mmol/L (ref 19–32)
Calcium: 9 mg/dL (ref 8.4–10.5)
Chloride: 108 mmol/L (ref 96–112)
Creatinine, Ser: 0.81 mg/dL (ref 0.50–1.10)
GFR calc Af Amer: >90 mL/min (ref >90)
GFR calc non Af Amer: 86 mL/min — ABNORMAL LOW (ref >90)
Glucose, Bld: 93 mg/dL (ref 70–99)
Potassium: 4 mmol/L (ref 3.5–5.1)
SODIUM: 138 mmol/L (ref 135–145)

## 2014-06-27 LAB — URINALYSIS, ROUTINE W REFLEX MICROSCOPIC
Bilirubin Urine: NEGATIVE
GLUCOSE, UA: NEGATIVE mg/dL
Hgb urine dipstick: NEGATIVE
Ketones, ur: NEGATIVE mg/dL
Leukocytes, UA: NEGATIVE
NITRITE: NEGATIVE
PH: 8 (ref 5.0–8.0)
Protein, ur: NEGATIVE mg/dL
SPECIFIC GRAVITY, URINE: 1.009 (ref 1.005–1.030)
UROBILINOGEN UA: 0.2 mg/dL (ref 0.0–1.0)

## 2014-06-27 LAB — CBC WITH DIFFERENTIAL/PLATELET
Basophils Absolute: 0 10*3/uL (ref 0.0–0.1)
Basophils Relative: 0 % (ref 0–1)
Eosinophils Absolute: 0.1 10*3/uL (ref 0.0–0.7)
Eosinophils Relative: 1 % (ref 0–5)
HEMATOCRIT: 33.3 % — AB (ref 36.0–46.0)
HEMOGLOBIN: 10.9 g/dL — AB (ref 12.0–15.0)
Lymphocytes Relative: 28 % (ref 12–46)
Lymphs Abs: 1.6 10*3/uL (ref 0.7–4.0)
MCH: 23.2 pg — ABNORMAL LOW (ref 26.0–34.0)
MCHC: 32.7 g/dL (ref 30.0–36.0)
MCV: 71 fL — ABNORMAL LOW (ref 78.0–100.0)
MONOS PCT: 7 % (ref 3–12)
Monocytes Absolute: 0.4 10*3/uL (ref 0.1–1.0)
NEUTROS ABS: 3.6 10*3/uL (ref 1.7–7.7)
NEUTROS PCT: 64 % (ref 43–77)
Platelets: 242 10*3/uL (ref 150–400)
RBC: 4.69 MIL/uL (ref 3.87–5.11)
RDW: 17.8 % — ABNORMAL HIGH (ref 11.5–15.5)
WBC: 5.6 10*3/uL (ref 4.0–10.5)

## 2014-06-27 LAB — HEPATIC FUNCTION PANEL
ALK PHOS: 86 U/L (ref 39–117)
ALT: 11 U/L (ref 0–35)
AST: 18 U/L (ref 0–37)
Albumin: 3.9 g/dL (ref 3.5–5.2)
Total Bilirubin: 0.3 mg/dL (ref 0.3–1.2)
Total Protein: 8.3 g/dL (ref 6.0–8.3)

## 2014-06-27 LAB — GLUCOSE, CAPILLARY: Glucose-Capillary: 169 mg/dL — ABNORMAL HIGH (ref 70–99)

## 2014-06-27 MED ORDER — ONDANSETRON HCL 4 MG/2ML IJ SOLN: 4.0000 mg | Freq: Four times a day (QID) | INTRAMUSCULAR | Status: DC | PRN

## 2014-06-27 MED ORDER — ACETAMINOPHEN 325 MG PO TABS
650.0000 mg | ORAL_TABLET | Freq: Four times a day (QID) | ORAL | Status: DC | PRN
Start: 2014-06-27 — End: 2014-06-30
  Administered 2014-06-28: 650 mg via ORAL
  Filled 2014-06-27: qty 2

## 2014-06-27 MED ORDER — INSULIN ASPART 100 UNIT/ML ~~LOC~~ SOLN
0.0000 [IU] | Freq: Three times a day (TID) | SUBCUTANEOUS | Status: DC
Start: 2014-06-28 — End: 2014-06-30
  Administered 2014-06-28 (×2): 1 [IU] via SUBCUTANEOUS
  Administered 2014-06-28: 2 [IU] via SUBCUTANEOUS
  Administered 2014-06-29: 1 [IU] via SUBCUTANEOUS
  Administered 2014-06-29 (×2): 2 [IU] via SUBCUTANEOUS

## 2014-06-27 MED ORDER — PANTOPRAZOLE SODIUM 40 MG PO TBEC
40.0000 mg | DELAYED_RELEASE_TABLET | Freq: Every day | ORAL | Status: DC
  Administered 2014-06-27 – 2014-06-30 (×4): 40 mg via ORAL
  Filled 2014-06-27 (×2): qty 1

## 2014-06-27 MED ORDER — ALUM & MAG HYDROXIDE-SIMETH 200-200-20 MG/5ML PO SUSP: 30.0000 mL | Freq: Four times a day (QID) | ORAL | Status: DC | PRN

## 2014-06-27 MED ORDER — PANTOPRAZOLE SODIUM 40 MG PO TBEC
40.0000 mg | DELAYED_RELEASE_TABLET | Freq: Every day | ORAL | Status: DC
  Administered 2014-06-27: 40 mg via ORAL
  Filled 2014-06-27: qty 1

## 2014-06-27 MED ORDER — SENNOSIDES-DOCUSATE SODIUM 8.6-50 MG PO TABS: 1.0000 | ORAL_TABLET | Freq: Every evening | ORAL | Status: DC | PRN

## 2014-06-27 MED ORDER — SODIUM CHLORIDE 0.9 % IV SOLN
1000.0000 mg | Freq: Once | INTRAVENOUS | Status: AC
  Administered 2014-06-27: 1000 mg via INTRAVENOUS
  Filled 2014-06-27 (×3): qty 8

## 2014-06-27 MED ORDER — ACETAMINOPHEN 160 MG/5ML PO SOLN: 900.0000 mg | Freq: Four times a day (QID) | ORAL | Status: DC | PRN

## 2014-06-27 MED ORDER — ACETAMINOPHEN 650 MG RE SUPP: 650.0000 mg | Freq: Four times a day (QID) | RECTAL | Status: DC | PRN

## 2014-06-27 MED ORDER — SODIUM CHLORIDE 0.9 % IV SOLN
1000.0000 mg | Freq: Every day | INTRAVENOUS | Status: AC
  Administered 2014-06-28 – 2014-06-29 (×2): 1000 mg via INTRAVENOUS
  Filled 2014-06-27 (×2): qty 8

## 2014-06-27 MED ORDER — SODIUM CHLORIDE 0.9 % IJ SOLN
3.0000 mL | Freq: Two times a day (BID) | INTRAMUSCULAR | Status: DC
  Administered 2014-06-27 – 2014-06-30 (×6): 3 mL via INTRAVENOUS

## 2014-06-27 MED ORDER — ONDANSETRON HCL 4 MG PO TABS: 4.0000 mg | ORAL_TABLET | Freq: Four times a day (QID) | ORAL | Status: DC | PRN

## 2014-06-27 MED ORDER — ENOXAPARIN SODIUM 40 MG/0.4ML ~~LOC~~ SOLN
40.0000 mg | SUBCUTANEOUS | Status: DC
  Administered 2014-06-27 – 2014-06-29 (×3): 40 mg via SUBCUTANEOUS
  Filled 2014-06-27 (×4): qty 0.4

## 2014-06-27 NOTE — Progress Notes (Signed)
NURSING PROGRESS NOTE  [name] [id number] Admission Data: 06/27/2014 5:12 PM Attending Provider: Annita Brod, MD KMM:[id number],UXYBF Pilar Plate, MD Code Status: DNR  Allergies:  Banana; Penicillins; and Sulfacetamide sodium Past Medical History:   has a past medical history of GERD (12/30/2006); PARESTHESIA (12/30/2006); Multiple sclerosis; Anemia; Hypertension; Headache(784.0); Stroke; and Color blind. Past Surgical History:   has past surgical history that includes Abdominal hysterectomy; Tonsillectomy; Anal intraepithelial neoplasia excision; Cesarean section; Myometomy; and Breast biopsy (10/2009). Social History:   reports that she has never smoked. She has never used smokeless tobacco. She reports that she does not drink alcohol or use illicit drugs.  [name] is a 49 y.o. female patient admitted from ED:   Last Documented Vital Signs: Blood pressure 129/81, pulse 97, temperature 99.2 F (37.3 C), temperature source Oral, resp. rate 20, height 5\' 3"  (1.6 m), weight 99.6 kg (219 lb 9.3 oz), SpO2 99 %.  Cardiac Monitoring: No Tele in place.   IV Fluids:  IV in place, occlusive dsg intact without redness, IV cath antecubital right, condition patent and no redness none.   Skin: WDL  Patient/Family orientated to room. Information packet given to patient/family. Admission inpatient armband information verified with patient/family to include name and date of birth and placed on patient arm. Side rails up x 2, fall assessment and education completed with patient/family. Patient/family able to verbalize understanding of risk associated with falls and verbalized understanding to call for assistance before getting out of bed. Call light within reach. Patient/family able to voice and demonstrate understanding of unit orientation instructions.    Will continue to evaluate and treat per MD orders.   Hendricks Limes RN, BS, BSN

## 2014-06-27 NOTE — ED Notes (Signed)
Food tray ordered for patient.

## 2014-06-27 NOTE — ED Notes (Signed)
Bedside toilet at bedside 

## 2014-06-27 NOTE — ED Provider Notes (Signed)
CSN: [id number]     Arrival date & time 06/27/14  0944 History   First MD Initiated Contact with Patient 06/27/14 [phone]     Chief Complaint  Patient presents with  . Multiple Sclerosis    "attack" per patient     (Consider location/radiation/quality/duration/timing/severity/associated sxs/prior Treatment) HPI Comments: 49 year old female with multiple sclerosis gradually worsening/progressing recently per her neurologist, recent MRI 2 weeks prior at [facility] presents with left-sided weaknessl. Patient feels this is similar to previous MS flares however worse and lasting longer.  around 745 this morning she started feel tired in general confused and then around 8:30 noticed left-sided weakness and leg weakness bilateral. this persisted until entering the ER when it started to improve. Patiently still has subjective weakness worse on the left side. No stroke history, no back surgeries, no fevers. Patient has not been compliant with her MS medication Avanex.  The history is provided by the patient.    Past Medical History  Diagnosis Date  . GERD 12/30/2006  . PARESTHESIA 12/30/2006  . Multiple sclerosis   . Anemia   . Hypertension   . Headache(784.0)   . Stroke   . Color blind     blue/green   Past Surgical History  Procedure Laterality Date  . Abdominal hysterectomy    . Tonsillectomy    . Anal intraepithelial neoplasia excision    . Cesarean section    . Myometomy    . Breast biopsy  10/2009   Family History  Problem Relation Age of Onset  . Stroke Father   . Psychosis Sister   . Hypertension    . Diabetes    . Dementia     History  Substance Use Topics  . Smoking status: Never Smoker   . Smokeless tobacco: Never Used  . Alcohol Use: No   OB History    No data available     Review of Systems  Constitutional: Positive for fatigue. Negative for fever and chills.  HENT: Negative for congestion.   Eyes: Negative for visual disturbance.  Respiratory: Negative for  shortness of breath.   Cardiovascular: Negative for chest pain.  Gastrointestinal: Positive for nausea. Negative for vomiting and abdominal pain.  Genitourinary: Negative for dysuria and flank pain.  Musculoskeletal: Negative for back pain, neck pain and neck stiffness.  Skin: Negative for rash.  Neurological: Positive for weakness and numbness. Negative for light-headedness and headaches.      Allergies  Banana; Penicillins; and Sulfacetamide sodium  Home Medications   Prior to Admission medications   Medication Sig Start Date End Date Taking? Authorizing Provider  acetaminophen (TYLENOL) 160 MG/5ML solution Take 900 mg by mouth every 6 (six) hours as needed for mild pain.   Yes Historical Provider, MD  esomeprazole (NEXIUM) 40 MG capsule Take 40 mg by mouth daily as needed (for reflux).  06/03/12  Yes Marletta Lor, MD  interferon beta-1a (AVONEX PEN) 30 MCG/0.5ML injection Inject 0.5 mLs (30 mcg total) into the muscle every 7 (seven) days. 04/19/13  Yes Philmore Pali, NP  SYRINGE-NEEDLE, DISP, 3 ML 25G X 1" 3 ML MISC 1 each by Does not apply route every 30 (thirty) days. 06/06/13  Yes Marletta Lor, MD  cyanocobalamin (,VITAMIN B-12,) 1000 MCG/ML injection Inject 1 mL (1,000 mcg total) into the muscle every 30 (thirty) days. Patient not taking: Reported on 06/27/2014 06/06/13   Marletta Lor, MD   BP 145/83 mmHg  Pulse 87  Temp(Src) 98.6 F (37 C) (Oral)  Resp 15  SpO2 99% Physical Exam  Constitutional: She is oriented to person, place, and time. She appears well-developed and well-nourished.  HENT:  Head: Normocephalic and atraumatic.  Eyes: Conjunctivae are normal. Right eye exhibits no discharge. Left eye exhibits no discharge.  Neck: Normal range of motion. Neck supple. No tracheal deviation present.  Cardiovascular: Normal rate and regular rhythm.   Pulmonary/Chest: Effort normal and breath sounds normal.  Abdominal: Soft. She exhibits no distension. There is  no tenderness. There is no guarding.  Musculoskeletal: She exhibits no edema.  Neurological: She is alert and oriented to person, place, and time. No cranial nerve deficit. GCS eye subscore is 4. GCS verbal subscore is 5. GCS motor subscore is 6.  Patient has equal strength bilateral upper and lower extremity, subjectively feels weak on the left, no arm drift no leg drift. Finger nose intact bilateral. Sensation intact bilateral and equal. Equal reflexes Achilles and patellar. Pupils equal and neck shock the muscle function intact, visual fields in all 4 quadrants bilateral equal.  Skin: Skin is warm. No rash noted.  Psychiatric: She has a normal mood and affect.  Nursing note and vitals reviewed.   ED Course  Procedures (including critical care time) Labs Review Labs Reviewed  BASIC METABOLIC PANEL - Abnormal; Notable for the following:    GFR calc non Af Amer 86 (*)    All other components within normal limits  CBC WITH DIFFERENTIAL/PLATELET - Abnormal; Notable for the following:    Hemoglobin 10.9 (*)    HCT 33.3 (*)    MCV 71.0 (*)    MCH 23.2 (*)    RDW 17.8 (*)    All other components within normal limits  URINE CULTURE  URINALYSIS, ROUTINE W REFLEX MICROSCOPIC  HEPATIC FUNCTION PANEL    Imaging Review Ct Head Wo Contrast  06/27/2014   CLINICAL DATA:  Headache with nausea and weakness for 1 day. Multiple sclerosis.  EXAM: CT HEAD WITHOUT CONTRAST  TECHNIQUE: Contiguous axial images were obtained from the base of the skull through the vertex without intravenous contrast.  COMPARISON:  Brain MRI 12/31/2006  FINDINGS: Skull and Sinuses:Negative for fracture or destructive process. The mastoids, middle ears, and imaged paranasal sinuses are clear.  Orbits: No acute abnormality.  Brain: No evidence of acute infarction, hemorrhage, hydrocephalus, or mass lesion/mass effect. Focal white matter low density around the body of the left lateral ventricle correlating with history of multiple  sclerosis. Given long imaging follow-up interval, there has been no significant progression.  IMPRESSION: 1. No acute intracranial disease. 2. Mild white matter disease correlating with history of multiple sclerosis.   Electronically Signed   By: Monte Fantasia M.D.   On: 06/27/2014 11:57     EKG Interpretation None      MDM   Final diagnoses:  MS (multiple sclerosis)  Left-sided weakness   Patient presents with neurologic complaints similar to MS flare in the past but more significant. With time of onset and stroke in the differential neurology consult at for further evaluation and also to discuss management of steroids in dose. Blood work and CT head ordered, neurology consult pending.  Patient has normal neuro exam in ER except subjective left sided weakness. Neurology evaluated and agreed likely MS flare recommended Solu-Medrol 1 g and observation/admission to medicine.  The patients results and plan were reviewed and discussed.   Any x-rays performed were personally reviewed by myself.   Differential diagnosis were considered with the presenting HPI.  Medications  methylPREDNISolone sodium succinate (SOLU-MEDROL) 1,000 mg in sodium chloride 0.9 % 50 mL IVPB (1,000 mg Intravenous Given 06/27/14 1304)    Filed Vitals:   06/27/14 1130 06/27/14 1200 06/27/14 1215 06/27/14 1303  BP: 140/81 139/81 127/98 145/83  Pulse: 88 84 83 87  Temp:      TempSrc:      Resp: 18 14 17 15   SpO2: 100% 100% 99% 99%    Final diagnoses:  MS (multiple sclerosis)  Left-sided weakness    Admission/ observation were discussed with the admitting physician, patient and/or family and they are comfortable with the plan.     Mariea Clonts, MD 06/27/14 [phone]

## 2014-06-27 NOTE — H&P (Signed)
[facility] History and Physical                                                                                    [name], is a 49 y.o. female  MRN: [id number]   DOB - 24-Nov-1965  Admit Date - 06/27/2014  Outpatient Primary MD for the patient is Nyoka Cowden, MD  With History of -  Past Medical History  Diagnosis Date  . GERD 12/30/2006  . PARESTHESIA 12/30/2006  . Multiple sclerosis   . Anemia   . Hypertension   . Headache(784.0)   . Stroke   . Color blind     blue/green      Past Surgical History  Procedure Laterality Date  . Abdominal hysterectomy    . Tonsillectomy    . Anal intraepithelial neoplasia excision    . Cesarean section    . Myometomy    . Breast biopsy  10/2009    in for   Chief Complaint  Patient presents with  . Multiple Sclerosis    "attack" per patient     HPI  [name]  is a 49 y.o. female, with a hx of multiple sclerosis and noncompliance with medical treatment, comes in today complaining of increased weakness and paresthesias and a "freezing episode".  She had been feeling poorly yesterday and even worse today, but went to work anyway and had an "episode".  She had nausea, dry heaving, and right sided chest pain that went through to her back. She felt like her legs weighed too much for her to lift, and she had about 4 minutes during which she could see and hear everything but couldn't move or speak.  She also had trouble catching her breath.  She was able to finally able to contact her supervisors via internal computer messaging, and they called an ambulance.  She regularly has weakness and decreased sensation from the waist down, but today it got worse. She was told in January her MS was getting worse and she would need to start more aggressive treatment.  She had an MRI last week and was supposed to see her Neurologist, Dr. Mancel Bale, at [facility] tomorrow.   Review of Systems   In addition to the HPI above,  No  Fever, + chills this morning, No Headache, This morning had black spots in vision, no diplopia  No problems swallowing food or Liquids, Right sided chest pain, Shortness of Breath during episode this morning, No Abdominal pain, + Nausea with dry heaving but no vomiting, Bowel movements are regular, No dysuria, Increased weakness, tingling, numbness in all extremities, more on the left than right A full 10 point Review of Systems was done, except as stated above, all other Review of Systems were negative.  Social History History  Substance Use Topics  . Smoking status: Never Smoker   . Smokeless tobacco: Never Used  . Alcohol Use: No  No Alcohol, tobacco or recreational drugs,  She is married and lives at home with a supportive family.  Family History Family History  Problem Relation Age of Onset  . Stroke Father   . Psychosis Sister   . Hypertension    .  Diabetes    . Dementia    Mother passed with dementia, Father had recurrent strokes.  Prior to Admission medications   Medication Sig Start Date End Date Taking? Authorizing Provider  acetaminophen (TYLENOL) 160 MG/5ML solution Take 900 mg by mouth every 6 (six) hours as needed for mild pain.   Yes Historical Provider, MD  esomeprazole (NEXIUM) 40 MG capsule Take 40 mg by mouth daily as needed (for reflux).  06/03/12  Yes Marletta Lor, MD  interferon beta-1a (AVONEX PEN) 30 MCG/0.5ML injection Inject 0.5 mLs (30 mcg total) into the muscle every 7 (seven) days. 04/19/13  Yes Philmore Pali, NP  SYRINGE-NEEDLE, DISP, 3 ML 25G X 1" 3 ML MISC 1 each by Does not apply route every 30 (thirty) days. 06/06/13  Yes Marletta Lor, MD  cyanocobalamin (,VITAMIN B-12,) 1000 MCG/ML injection Inject 1 mL (1,000 mcg total) into the muscle every 30 (thirty) days. Patient not taking: Reported on 06/27/2014 06/06/13   Marletta Lor, MD    Allergies  Allergen Reactions  . Banana   . Penicillins   . Sulfacetamide Sodium     Physical  Exam  Vitals  Blood pressure 127/98, pulse 83, temperature 98.6 F (37 C), temperature source Oral, resp. rate 17, SpO2 99 %.   General:  lying in bed in NAD, appears comfortable.  Sister and brother were at bedside.  Psych:  Normal affect and insight, Awake Alert, Oriented X 3.  Neuro:   No F.N deficits, ALL C.Nerves grossly Intact, Strength 5/5 all 4 extremities, Sensation dulled in all 4 extremites - left more than right  ENT:  Conjunctivae clear. Moist oral mucosa without erythema or exudates.  Neck:  Supple, No lymphadenopathy appreciated  Respiratory:  Symmetrical chest wall movement, Good air movement bilaterally, CTAB.  Cardiac:  RRR, No Murmurs, no LE edema noted, no JVD.    Abdomen:  Positive bowel sounds, Soft, Non tender, Non distended,  No masses appreciated  Skin:  No Cyanosis, Normal Skin Turgor, No Skin Rash or Bruise.  Extremities:  Able to move all 4.   Data Review  CBC  Recent Labs Lab 06/27/14 1026  WBC 5.6  HGB 10.9*  HCT 33.3*  PLT 242  MCV 71.0*  MCH 23.2*  MCHC 32.7  RDW 17.8*  LYMPHSABS 1.6  MONOABS 0.4  EOSABS 0.1  BASOSABS 0.0    Chemistries   Recent Labs Lab 06/27/14 1026  NA 138  K 4.0  CL 108  CO2 23  GLUCOSE 93  BUN 7  CREATININE 0.81  CALCIUM 9.0    Imaging results:   Ct Head Wo Contrast  06/27/2014   CLINICAL DATA:  Headache with nausea and weakness for 1 day. Multiple sclerosis.  EXAM: CT HEAD WITHOUT CONTRAST  TECHNIQUE: Contiguous axial images were obtained from the base of the skull through the vertex without intravenous contrast.  COMPARISON:  Brain MRI 12/31/2006  FINDINGS: Skull and Sinuses:Negative for fracture or destructive process. The mastoids, middle ears, and imaged paranasal sinuses are clear.  Orbits: No acute abnormality.  Brain: No evidence of acute infarction, hemorrhage, hydrocephalus, or mass lesion/mass effect. Focal white matter low density around the body of the left lateral ventricle  correlating with history of multiple sclerosis. Given long imaging follow-up interval, there has been no significant progression.  IMPRESSION: 1. No acute intracranial disease. 2. Mild white matter disease correlating with history of multiple sclerosis.   Electronically Signed   By: Neva Seat.D.  On: 06/27/2014 11:57    Assessment & Plan  Principal Problem:   MS (multiple sclerosis) Active Problems:   Right-sided chest pain   Microcytic anemia  Multiple Sclerosis Diagnosed in 2008, follows with Dr. Elvia Collum at [facility] in January that her disease was getting worse Checking UA for signs of infection, left a message for Dr. Doy Mince PT/OT consult ordered Neurology consulted - recommend IV solumedrol 1000mg  x 3 days if no signs of infection  Right-sided chest pain Uncertain etiology, possible cholecystitis/GERD Not associated with food, worse at night Will check LFTs, [place], EKG Placed on protonix, hx of acid reflux  Microcytic Anemia No frank bleeding or bruising Chronic for the last year Stable for outpatient follow-up   DVT Prophylaxis: Lovenox  AM Labs Ordered, also please review Full Orders  Family Communication:   Brother and Sister at bedside  Code Status:  DNR  Condition:  Guarded  Time spent in minutes : [place]. Howell-Methvin, PA-S Imogene Burn,  PA-C on 06/27/2014 at 12:54 PM  After 7pm go to [email or web address] - password [email or web address]  And look for the night coverage person covering me after hours  [facility]

## 2014-06-27 NOTE — ED Notes (Addendum)
Patient states she was at work this morning and started "feeling bad around [phone]".   Patient states she has multiple sclerosis and this feels like an "attack".  Patient complains of bilateral hand and foot tingling and numbness with worsening on the L side.   Patient states feels weak.   Patient states had some nausea and anxiety this morning.  EMS report same.   Patient states has a scheduled appointment with [facility] tomorrow for same.

## 2014-06-27 NOTE — Consult Note (Addendum)
Consult Reason for Consult:weakness and paresthesias Referring Physician: Dr Reather Converse  CC: weakness and paresthesias  HPI: [name] is an 49 y.o. female with history of MS presenting for evaluation of bilateral hand and foot paresthesias and sensory loss/weakness of her left upper extremity. Patient notes symptoms started around 0830 and feel like one of her MS attacks.  She is followed by Dr Doy Mince at [facility]. She is on Avonex but notes poor compliance with medication. She is scheduled to switch to [place] in the near future. Reports having had a MRI done in the past few weeks which shows progression of her disease.    Past Medical History  Diagnosis Date  . GERD 12/30/2006  . PARESTHESIA 12/30/2006  . Multiple sclerosis   . Anemia   . Hypertension   . Headache(784.0)   . Stroke   . Color blind     blue/green    Past Surgical History  Procedure Laterality Date  . Abdominal hysterectomy    . Tonsillectomy    . Anal intraepithelial neoplasia excision    . Cesarean section    . Myometomy    . Breast biopsy  10/2009    Family History  Problem Relation Age of Onset  . Stroke Father   . Psychosis Sister   . Hypertension    . Diabetes    . Dementia      Social History:  reports that she has never smoked. She has never used smokeless tobacco. She reports that she does not drink alcohol or use illicit drugs.  Allergies  Allergen Reactions  . Banana   . Penicillins   . Sulfacetamide Sodium     Medications: I have reviewed the patient's current medications.  CT head imaging reviewed and overall unremarkable.  ROS: Out of a complete 14 system review, the patient complains of only the following symptoms, and all other reviewed systems are negative. +paresthesias, weakness  Physical Examination: Filed Vitals:   06/27/14 1114  BP:   Pulse:   Temp: 98.6 F (37 C)  Resp:    Physical Exam  Constitutional: He appears well-developed and well-nourished.   Psych: Affect appropriate to situation Eyes: No scleral injection HENT: No OP obstrucion Head: Normocephalic.  Cardiovascular: Normal rate and regular rhythm.  Respiratory: Effort normal and breath sounds normal.  GI: Soft. Bowel sounds are normal. No distension. There is no tenderness.  Skin: WDI  Neurologic Examination Mental Status: Alert, oriented, thought content appropriate.  Speech fluent without evidence of aphasia.  Able to follow 3 step commands without difficulty. Cranial Nerves: II: funduscopic exam wnl bilaterally, visual fields grossly normal, pupils equal, round, reactive to light and accommodation III,IV, VI: ptosis not present, extra-ocular motions intact bilaterally V,VII: smile symmetric, facial light touch sensation normal bilaterally VIII: hearing normal bilaterally IX,X: gag reflex present XI: trapezius strength/neck flexion strength normal bilaterally XII: tongue strength normal  Motor: Right : Upper extremity    Left:     Upper extremity 5/5 deltoid       5-/5 deltoid 5/5 biceps      5-/5 biceps  5/5 triceps      5-/5 triceps 5/5 hand grip      5/5 hand grip  Lower extremity     Lower extremity 5-/5 hip flexor      5-/5 hip flexor 5/5 quadricep      5-5 quadriceps  5/5 hamstrings     5-/5 hamstrings 5/5 plantar flexion       5/5 plantar flexion  5/5 plantar extension     5/5 plantar extension Tone and bulk:normal tone throughout; no atrophy noted Sensory: decreased LT LUE and distal bilateral LE Deep Tendon Reflexes: 2+ biceps, triceps, 3+ patellar, 2+ achilles Plantars: Right: downgoing   Left: downgoing Cerebellar: Normal FTN, mild bilateral ataxia with HTS Gait: deferred  Laboratory Studies:   Basic Metabolic Panel: No results for input(s): NA, K, CL, CO2, GLUCOSE, BUN, CREATININE, CALCIUM, MG, PHOS in the last 168 hours.  Liver Function Tests: No results for input(s): AST, ALT, ALKPHOS, BILITOT, PROT, ALBUMIN in the last 168 hours. No  results for input(s): LIPASE, AMYLASE in the last 168 hours. No results for input(s): AMMONIA in the last 168 hours.  CBC: No results for input(s): WBC, NEUTROABS, HGB, HCT, MCV, PLT in the last 168 hours.  Cardiac Enzymes: No results for input(s): CKTOTAL, CKMB, CKMBINDEX, TROPONINI in the last 168 hours.  BNP: Invalid input(s): POCBNP  CBG: No results for input(s): GLUCAP in the last 168 hours.  Microbiology: Results for orders placed or performed in visit on 10/17/12  Microscopic Examination     Status: Abnormal   Collection Time: 10/17/12  1:55 PM  Result Value Ref Range Status   WBC, UA 0-5 0 -  5 /hpf Final   RBC, UA 0-2 0 -  2 /hpf Final   Epithelial Cells (non renal) 0-10 0 - 10 /hpf Final   Crystals Present (A) N/A Final   Crystal Type Calcium Oxalate N/A Final   Mucus, UA Present Not Estab. Final   Bacteria, UA Few None seen/Few Final    Coagulation Studies: No results for input(s): LABPROT, INR in the last 72 hours.  Urinalysis: No results for input(s): COLORURINE, LABSPEC, PHURINE, GLUCOSEU, HGBUR, BILIRUBINUR, KETONESUR, PROTEINUR, UROBILINOGEN, NITRITE, LEUKOCYTESUR in the last 168 hours.  Invalid input(s): APPERANCEUR  Lipid Panel:     Component Value Date/Time   CHOL 166 05/30/2013 0912   TRIG 55.0 05/30/2013 0912   HDL 51.80 05/30/2013 0912   CHOLHDL 3 05/30/2013 0912   VLDL 11.0 05/30/2013 0912   LDLCALC 103* 05/30/2013 0912    HgbA1C:  Lab Results  Component Value Date   HGBA1C 6.1* 10/17/2012    Urine Drug Screen:  No results found for: LABOPIA, COCAINSCRNUR, LABBENZ, AMPHETMU, THCU, LABBARB  Alcohol Level: No results for input(s): ETH in the last 168 hours.  Other results:  Imaging: No results found.   Assessment/Plan:  48y/o with history of hypertension, anemia, B12 deficiency and RRMS for which she is on Avonex and followed at [facility]. She notes poor compliance with her Avonex. Presents today with acute worsening of extremity  paresthesias and left sided sensory loss/weakness. Symptoms concerning for possible MS flare.   -check CBC and UA -if no signs of infection start IV solumedrol 1000mg  x 3 days -GI prophylaxis and CBG check while on solumedrol -PT/OT evaluation -outpatient neurology follow up with Dr Doy Mince at [facility], [place]  If [place], please page neurology on call as listed in [place]. 06/27/2014, 11:21 AM

## 2014-06-27 NOTE — ED Notes (Signed)
Pt transported to US

## 2014-06-27 NOTE — ED Notes (Signed)
Pt left for CT.

## 2014-06-28 ENCOUNTER — Telehealth: Payer: Self-pay | Admitting: Internal Medicine

## 2014-06-28 DIAGNOSIS — D539 Nutritional anemia, unspecified: Secondary | ICD-10-CM | POA: Diagnosis present

## 2014-06-28 DIAGNOSIS — Z90710 Acquired absence of both cervix and uterus: Secondary | ICD-10-CM | POA: Diagnosis not present

## 2014-06-28 DIAGNOSIS — Z66 Do not resuscitate: Secondary | ICD-10-CM | POA: Diagnosis present

## 2014-06-28 DIAGNOSIS — Z882 Allergy status to sulfonamides status: Secondary | ICD-10-CM | POA: Diagnosis not present

## 2014-06-28 DIAGNOSIS — Z9119 Patient's noncompliance with other medical treatment and regimen: Secondary | ICD-10-CM | POA: Diagnosis present

## 2014-06-28 DIAGNOSIS — E669 Obesity, unspecified: Secondary | ICD-10-CM | POA: Diagnosis present

## 2014-06-28 DIAGNOSIS — Z91018 Allergy to other foods: Secondary | ICD-10-CM | POA: Diagnosis not present

## 2014-06-28 DIAGNOSIS — I10 Essential (primary) hypertension: Secondary | ICD-10-CM | POA: Diagnosis present

## 2014-06-28 DIAGNOSIS — R079 Chest pain, unspecified: Secondary | ICD-10-CM | POA: Diagnosis present

## 2014-06-28 DIAGNOSIS — H5350 Unspecified color vision deficiencies: Secondary | ICD-10-CM | POA: Diagnosis present

## 2014-06-28 DIAGNOSIS — Z6838 Body mass index (BMI) 38.0-38.9, adult: Secondary | ICD-10-CM | POA: Diagnosis not present

## 2014-06-28 DIAGNOSIS — Z880 Allergy status to penicillin: Secondary | ICD-10-CM | POA: Diagnosis not present

## 2014-06-28 DIAGNOSIS — G35 Multiple sclerosis: Secondary | ICD-10-CM | POA: Diagnosis present

## 2014-06-28 DIAGNOSIS — E538 Deficiency of other specified B group vitamins: Secondary | ICD-10-CM | POA: Diagnosis present

## 2014-06-28 DIAGNOSIS — Z8673 Personal history of transient ischemic attack (TIA), and cerebral infarction without residual deficits: Secondary | ICD-10-CM | POA: Diagnosis not present

## 2014-06-28 DIAGNOSIS — K219 Gastro-esophageal reflux disease without esophagitis: Secondary | ICD-10-CM | POA: Diagnosis present

## 2014-06-28 LAB — URINE CULTURE

## 2014-06-28 LAB — GLUCOSE, CAPILLARY
Glucose-Capillary: 126 mg/dL — ABNORMAL HIGH (ref 70–99)
Glucose-Capillary: 157 mg/dL — ABNORMAL HIGH (ref 70–99)
Glucose-Capillary: 148 mg/dL — ABNORMAL HIGH (ref 70–99)
Glucose-Capillary: 183 mg/dL — ABNORMAL HIGH (ref 70–99)

## 2014-06-28 NOTE — Progress Notes (Signed)
   06/28/14 1111  PT Visit Information  Last PT Received On 06/28/14  Assistance Needed +1  History of Present Illness 49 yo female adm 06/27/14 with weakness and right sided CP; PMHx:  MS  Precautions  Precautions Fall  Pain Assessment  Pain Assessment 0-10  Pain Score 4  Pain Location head  Pain Descriptors / Indicators Aching  Pain Intervention(s) Limited activity within patient's tolerance;Monitored during session;Premedicated before session  Cognition  Arousal/Alertness Awake/alert  Behavior During Therapy WFL for tasks assessed/performed  Overall Cognitive Status Within Functional Limits for tasks assessed  Bed Mobility  Overal bed mobility Modified Independent  Transfers  Overall transfer level Modified independent  Ambulation/Gait  Ambulation/Gait assistance Supervision;Min guard  Ambulation Distance (Feet) 120 Feet  Assistive device None  Gait Pattern/deviations Step-through pattern;Decreased stride length;Decreased dorsiflexion - left;Wide base of support;Drifts right/left  General Gait Details pt reports she is not at her baseline, feels weaker;  pt reports mild "lightheadedness" BP 135/86; she does fatigue quickly with gait, one brief (3-5sec) standing rest  Balance  Standing balance-Leahy Scale Good  Standing balance comment delayed blance reactions  High level balance activites Turns;Head turns;Direction changes  PT - End of Session  Equipment Utilized During Treatment Gait belt  Activity Tolerance Patient tolerated treatment well  Patient left in bed;with call bell/phone within reach;with family/visitor present  Nurse Communication Mobility status  PT - Assessment/Plan  PT Plan Current plan remains appropriate  PT Frequency (ACUTE ONLY) Min 3X/week  Follow Up Recommendations No PT follow up  PT equipment None recommended by PT  PT Goal Progression  Progress towards PT goals Progressing toward goals  Acute Rehab PT Goals  PT Goal Formulation With patient   Time For Goal Achievement 07/05/14  PT General Charges  $$ ACUTE PT VISIT 1 Procedure  PT Treatments  $Gait Training 8-22 mins

## 2014-06-28 NOTE — Progress Notes (Signed)
Subjective: patient has recieved one dose of solumedrol and is feeling both stronger in arm and leg along with slight improvement in bilateral legs.   Objective: Current vital signs: BP 131/84 mmHg  Pulse 86  Temp(Src) 98.3 F (36.8 C) (Oral)  Resp 16  Ht $R'5\' 3"'iQ$  (1.6 m)  Wt 99.6 kg (219 lb 9.3 oz)  BMI 38.91 kg/m2  SpO2 98%  LMP 05/01/2014 (Approximate) Vital signs in last 24 hours: Temp:  [98.3 F (36.8 C)-99.2 F (37.3 C)] 98.3 F (36.8 C) (02/11 0706) Pulse Rate:  [75-97] 86 (02/11 0706) Resp:  [10-20] 16 (02/11 0706) BP: (120-145)/(72-98) 131/84 mmHg (02/11 0706) SpO2:  [97 %-100 %] 98 % (02/11 0706) Weight:  [99.6 kg (219 lb 9.3 oz)] 99.6 kg (219 lb 9.3 oz) (02/10 1645)  Intake/Output from previous day:   Intake/Output this shift:   Nutritional status: Diet heart healthy/carb modified  Neurologic Exam: General: Mental Status: Alert, oriented, thought content appropriate.  Speech fluent without evidence of aphasia.  Able to follow 3 step commands without difficulty. Cranial Nerves: II: Discs flat bilaterally; Visual fields grossly normal, pupils equal, round, reactive to light and accommodation III,IV, VI: ptosis not present, extra-ocular motions intact bilaterally V,VII: smile symmetric, facial light touch sensation normal bilaterally VIII: hearing normal bilaterally IX,X: gag reflex present XI: bilateral shoulder shrug XII: midline tongue extension without atrophy or fasciculations  Motor: Right : Upper extremity   5/5    Left:     Upper extremity   5/5  Lower extremity   5/5     Lower extremity   5/5 Tone and bulk:normal tone throughout; no atrophy noted Sensory: Pinprick and light touch intact throughout but stated to be slightly decreased on bilateral legs.  Deep Tendon Reflexes:  Right: Upper Extremity   Left: Upper extremity   biceps (C-5 to C-6) 2/4   biceps (C-5 to C-6) 2/4 tricep (C7) 2/4    triceps (C7) 2/4 Brachioradialis (C6) 2/4  Brachioradialis  (C6) 2/4  Lower Extremity Lower Extremity  quadriceps (L-2 to L-4) 3/4   quadriceps (L-2 to L-4) 3/4 Achilles (S1) 2/4   Achilles (S1) 2/4  Plantars: Right: downgoing   Left: downgoing Cerebellar: normal finger-to-nose,  normal heel-to-shin test    Lab Results: Basic Metabolic Panel:  Recent Labs Lab 06/27/14 1026  NA 138  K 4.0  CL 108  CO2 23  GLUCOSE 93  BUN 7  CREATININE 0.81  CALCIUM 9.0    Liver Function Tests:  Recent Labs Lab 06/27/14 1026  AST 18  ALT 11  ALKPHOS 86  BILITOT 0.3  PROT 8.3  ALBUMIN 3.9   No results for input(s): LIPASE, AMYLASE in the last 168 hours. No results for input(s): AMMONIA in the last 168 hours.  CBC:  Recent Labs Lab 06/27/14 1026  WBC 5.6  NEUTROABS 3.6  HGB 10.9*  HCT 33.3*  MCV 71.0*  PLT 242    Cardiac Enzymes: No results for input(s): CKTOTAL, CKMB, CKMBINDEX, TROPONINI in the last 168 hours.  Lipid Panel: No results for input(s): CHOL, TRIG, HDL, CHOLHDL, VLDL, LDLCALC in the last 168 hours.  CBG:  Recent Labs Lab 06/27/14 2137 06/28/14 0817  GLUCAP 169* 126*    Microbiology: Results for orders placed or performed in visit on 10/17/12  Microscopic Examination     Status: Abnormal   Collection Time: 10/17/12  1:55 PM  Result Value Ref Range Status   WBC, UA 0-5 0 -  5 /hpf Final  RBC, UA 0-2 0 -  2 /hpf Final   Epithelial Cells (non renal) 0-10 0 - 10 /hpf Final   Crystals Present (A) N/A Final   Crystal Type Calcium Oxalate N/A Final   Mucus, UA Present Not Estab. Final   Bacteria, UA Few None seen/Few Final    Coagulation Studies: No results for input(s): LABPROT, INR in the last 72 hours.  Imaging: Ct Head Wo Contrast  06/27/2014   CLINICAL DATA:  Headache with nausea and weakness for 1 day. Multiple sclerosis.  EXAM: CT HEAD WITHOUT CONTRAST  TECHNIQUE: Contiguous axial images were obtained from the base of the skull through the vertex without intravenous contrast.  COMPARISON:   Brain MRI 12/31/2006  FINDINGS: Skull and Sinuses:Negative for fracture or destructive process. The mastoids, middle ears, and imaged paranasal sinuses are clear.  Orbits: No acute abnormality.  Brain: No evidence of acute infarction, hemorrhage, hydrocephalus, or mass lesion/mass effect. Focal white matter low density around the body of the left lateral ventricle correlating with history of multiple sclerosis. Given long imaging follow-up interval, there has been no significant progression.  IMPRESSION: 1. No acute intracranial disease. 2. Mild white matter disease correlating with history of multiple sclerosis.   Electronically Signed   By: Monte Fantasia M.D.   On: 06/27/2014 11:57   US Abdomen Limited  06/27/2014   CLINICAL DATA:  49 year old female with right chest/right upper quadrant pain for the past 4 weeks.  EXAM: US ABDOMEN LIMITED - RIGHT UPPER QUADRANT  COMPARISON:  None.  FINDINGS: Gallbladder:  No gallstones or wall thickening visualized. No sonographic Murphy sign noted.  Common bile duct:  Diameter: Within normal limits at 3 mm.  Liver:  No focal lesion identified. Main portal vein patent with normal hepatopetal flow. Increased echogenicity of the hepatic parenchyma with coarsening of the echotexture. The adjacent renal parenchyma appears hypoechoic in comparison. Findings suggest hepatic steatosis.  IMPRESSION: 1. No evidence of cholelithiasis or acute cholecystitis. 2. Hepatic steatosis.   Electronically Signed   By: Jacqulynn Cadet M.D.   On: 06/27/2014 13:56    Medications:  Prior to Admission:  Prescriptions prior to admission  Medication Sig Dispense Refill Last Dose  . acetaminophen (TYLENOL) 160 MG/5ML solution Take 900 mg by mouth every 6 (six) hours as needed for mild pain.   Past Week at Unknown time  . esomeprazole (NEXIUM) 40 MG capsule Take 40 mg by mouth daily as needed (for reflux).    06/27/2014 at Unknown time  . interferon beta-1a (AVONEX PEN) 30 MCG/0.5ML injection  Inject 0.5 mLs (30 mcg total) into the muscle every 7 (seven) days. 3 kit 6 Past Week at Unknown time  . cyanocobalamin (,VITAMIN B-12,) 1000 MCG/ML injection Inject 1 mL (1,000 mcg total) into the muscle every 30 (thirty) days. (Patient not taking: Reported on 06/27/2014) 10 mL 1 More than a month at Unknown time  . SYRINGE-NEEDLE, DISP, 3 ML 25G X 1" 3 ML MISC 1 each by Does not apply route every 30 (thirty) days. 50 each 1 Unknown at Unknown time   Scheduled: . enoxaparin (LOVENOX) injection  40 mg Subcutaneous Q24H  . insulin aspart  0-9 Units Subcutaneous TID WC  . methylPREDNISolone (SOLU-MEDROL) injection  1,000 mg Intravenous Daily  . pantoprazole  40 mg Oral Daily  . sodium chloride  3 mL Intravenous Q12H    Assessment/Plan:  48y/o with history of hypertension, anemia, B12 deficiency and RRMS for which she is on Avonex and followed at  [facility]. She notes poor compliance with her Avonex. Presents today with acute worsening of extremity paresthesias and left sided sensory loss/weakness. Symptoms concerning for possible MS flare. UA (-)  Patient has had 1/3 doses Solumedrol and to receive second dose today  Recommend: 1) continue solumedrol with GI Px 2) outpatient neurology follow up with Dr Doy Mince at [facility] after finished with Solumedrol --I have spoken with patient and they are calling today to reschedule.   Etta Quill PA-C [facility] [phone]  06/28/2014, 9:18 AM

## 2014-06-28 NOTE — Telephone Encounter (Signed)
Spoke to pt's husband [name] who is with pt in the hospital,told him still need to keep lab appointment will draw other labs needed for physical will not repeat the ones already done. [name] verbalized understanding and will let wife know.

## 2014-06-28 NOTE — Telephone Encounter (Signed)
Patient is in the hospital and would like to know if the blood work drawn there can be used instead of having CPX labs done?? She would like to try and minimize appointments if possible.

## 2014-06-28 NOTE — Evaluation (Signed)
Occupational Therapy Evaluation Patient Details Name: [name] MRN: [id number] DOB: Dec 13, 1965 Today's Date: 06/28/2014    History of Present Illness 49 yo female adm 06/27/14 with weakness and right sided CP; PMHx:  MS   Clinical Impression   PTA pt lived at home and was independent with ADLs with occasional use of AD for functional mobility. Pt reports increasing difficulty with work tasks including getting into work. Pt requires supervision for ADLs and is a fall risk, especially in the shower, due to dizziness when standing and LE numbness/tingling. No further acute OT needs, pt will benefit from shower seat.     Follow Up Recommendations  No OT follow up;Supervision/Assistance - 24 hour    Equipment Recommendations  Tub/shower seat    Recommendations for Other Services       Precautions / Restrictions Precautions Precautions: Fall Restrictions Weight Bearing Restrictions: No      Mobility Bed Mobility Overal bed mobility: Modified Independent                Transfers Overall transfer level: Modified independent                         ADL Overall ADL's : Needs assistance/impaired                                       General ADL Comments: Pt requires Supervision for safety with ADLs and functional mobility. Pt presents as fall risk due to LE numbness/tingling and "lightheadedness" which she reports has been happening for a while. Discussed fall prevention and energy conservation as well as work and environment modifications pt can do to maintain Independence.      Vision Additional Comments: pt reports that visual changes have resolved but reports these are common with her MS flare ups.           Pertinent Vitals/Pain Pain Assessment: No/denies pain     Hand Dominance     Extremity/Trunk Assessment Upper Extremity Assessment Upper Extremity Assessment: Overall WFL for tasks assessed (c/o tingling in  fingertips)   Lower Extremity Assessment Lower Extremity Assessment: Overall WFL for tasks assessed (c/o LE numbness/tingling for 2 years)   Cervical / Trunk Assessment Cervical / Trunk Assessment: Normal   Communication Communication Communication: No difficulties   Cognition Arousal/Alertness: Awake/alert Behavior During Therapy: WFL for tasks assessed/performed Overall Cognitive Status: Within Functional Limits for tasks assessed                                Home Living Family/patient expects to be discharged to:: Private residence Living Arrangements: Spouse/significant other Available Help at Discharge: Family Type of Home: House Home Access: Stairs to enter Technical brewer of Steps: 7   Home Layout: Two level;Full bath on main level;1/2 bath on main level Alternate Level Stairs-Number of Steps: can sleep downstairs but does go up adn down stairs daily   Bathroom Shower/Tub: Occupational psychologist: Standard     Home Equipment: Kasandra Knudsen - single point   Additional Comments: 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      Prior Functioning/Environment Level of Independence: Independent with assistive device(s)             OT Diagnosis: Generalized weakness    End of Session  Activity Tolerance: Patient  tolerated treatment well Patient left: in chair;with call bell/phone within reach;with family/visitor present   Time: 3539-1225 OT Time Calculation (min): 27 min Charges:  OT General Charges $OT Visit: 1 Procedure OT Evaluation $Initial OT Evaluation Tier I: 1 Procedure OT Treatments $Self Care/Home Management : 8-22 mins G-Codes:    Juluis Rainier 07-06-14, 5:36 PM  Secundino Ginger Lynetta Mare, OTR/L Occupational Therapist [phone] (pager)

## 2014-06-28 NOTE — Evaluation (Signed)
Physical Therapy Evaluation Patient Details Name: [name] MRN: [id number] DOB: 11-May-1966 Today's Date: 06/28/2014   History of Present Illness  49 yo female adm 06/27/14 with weakness and right sided CP; PMHx:  MS  Clinical Impression  Pt will benefit from PT to address deficits below; plan is for home without f/u PT, has cane, supportive family Pt was finishing breakfast, felt dizzy and wanted PT to come back a little later to continue mobility;     Follow Up Recommendations No PT follow up    Equipment Recommendations  None recommended by PT    Recommendations for Other Services       Precautions / Restrictions Precautions Precautions: Fall Restrictions Weight Bearing Restrictions: No      Mobility  Bed Mobility Overal bed mobility: Modified Independent                Transfers Overall transfer level: Modified independent                             Wheelchair Mobility    Modified Rankin (Stroke Patients Only)       Balance                           Pertinent Vitals/Pain Pain Assessment: No/denies pain    Home Living Family/patient expects to be discharged to:: Private residence Living Arrangements: Spouse/significant other Available Help at Discharge: Family Type of Home: House Home Access: Stairs to enter   Technical brewer of Steps: 7 Home Layout: Two level;Full bath on main level;1/2 bath on main level Home Equipment: Cane - single point Additional 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    Prior Function Level of Independence: Independent;Independent with assistive device(s)               Hand Dominance        Extremity/Trunk Assessment   Upper Extremity Assessment: Overall WFL for tasks assessed;Defer to OT evaluation           Lower Extremity Assessment: Overall WFL for tasks assessed;RLE deficits/detail;LLE deficits/detail   LLE Deficits / Details: diminished   dorsiflexion-pf control wtih gait bil L>R  Cervical / Trunk Assessment: Normal  Communication   Communication: No difficulties  Cognition Arousal/Alertness: Awake/alert Behavior During Therapy: WFL for tasks assessed/performed Overall Cognitive Status: Within Functional Limits for tasks assessed                      General Comments      Exercises        Assessment/Plan    PT Assessment Patient needs continued PT services  PT Diagnosis Difficulty walking   PT Problem List Decreased activity tolerance;Decreased balance;Decreased coordination  PT Treatment Interventions Gait training;Stair training;Functional mobility training;Balance training;Therapeutic exercise   PT Goals (Current goals can be found in the Care Plan section) Acute Rehab PT Goals Patient Stated Goal: incr gait distance as she has a very long walk into work PT Goal Formulation: With patient Time For Goal Achievement: 07/05/14    Frequency Min 3X/week   Barriers to discharge        Co-evaluation               End of Session Equipment Utilized During Treatment: Gait belt Activity Tolerance: Patient tolerated treatment well Patient left: in bed;with call bell/phone within reach;with family/visitor present Nurse Communication: Mobility status  Functional Assessment Tool Used: clinical judgement Functional Limitation: Mobility: Walking and moving around Mobility: Walking and Moving Around Current Status [phone]): At least 1 percent but less than 20 percent impaired, limited or restricted Mobility: Walking and Moving Around Goal Status [phone]): At least 1 percent but less than 20 percent impaired, limited or restricted    Time: 1000-1011 PT Time Calculation (min) (ACUTE ONLY): 11 min   Charges:    EV     PT G Codes:   PT G-Codes **NOT FOR INPATIENT CLASS** Functional Assessment Tool Used: clinical judgement Functional Limitation: Mobility: Walking and moving around Mobility:  Walking and Moving Around Current Status ([id number]): At least 1 percent but less than 20 percent impaired, limited or restricted Mobility: Walking and Moving Around Goal Status [phone]): At least 1 percent but less than 20 percent impaired, limited or restricted    [facility] 06/28/2014, 11:07 AM

## 2014-06-28 NOTE — Progress Notes (Addendum)
[facility] PROGRESS NOTE  [name] TJQ:[id number] DOB: 1965/05/27 DOA: 06/27/2014 PCP: Nyoka Cowden, MD  Assessment/Plan:  Multiple sclerosis flare - Appreciate input of neurology.   - Patient completed day 2 of 3 of pulse dose steroids, Solu-Medrol 1 g IV daily 3 days.  - Patient to follow-up with Dr. Doy Mince at [facility] after discharge. - Patient requested a work excuse at the time of discharge as she indicated that she is not able to work in her present condition.  Right-sided chest pain - Improved.  Patient strongly believes that as her MS symptoms are improving, her pain is also improving. - Abdominal ultrasound negative.  Macrocytic anemia - Likely due to chronic disease.  Hemoglobin stable at this time.  Continue to monitor. - Patient also has history of B-12 deficiency, check B-12 level in the morning.  Obesity - Further management as outpatient.  Code Status: Full code Family Communication: Patient updated at bedside.  Disposition Plan: Possible discharge tomorrow after receiving her dose of IV steroids.   Consultants:  Neurology  Procedures:  Abdominal ultrasound on 06/27/2014: No evidence of cholelithiasis or acute cholecystitis, hepatic steatosis  Head CT without contrast on 06/27/2014: No acute intracranial disease, mild white matter disease correlating with history of multiple sclerosis.  Antibiotics:  None  HPI/Subjective: Patient feels like her symptoms are significantly better today than yesterday.  Her epigastric/right chest pain is significantly better.  Objective: Filed Vitals:   06/27/14 1645 06/27/14 2135 06/28/14 0706 06/28/14 1600  BP: 129/81 130/72 131/84 115/66  Pulse: 97 95 86 86  Temp: 99.2 F (37.3 C) 98.8 F (37.1 C) 98.3 F (36.8 C) 98.6 F (37 C)  TempSrc: Oral Oral Oral Oral  Resp: 20 16 16 20   Height: 5\' 3"  (1.6 m)     Weight: 99.6 kg (219 lb 9.3 oz)     SpO2: 99% 100% 98% 99%   No intake or  output data in the 24 hours ending 06/28/14 1643 Filed Weights   06/27/14 1645  Weight: 99.6 kg (219 lb 9.3 oz)    Exam:  Physical Exam: General: Awake, Oriented, No acute distress. HEENT: EOMI. Neck: Supple CV: S1 and S2 Lungs: Clear to ascultation bilaterally Abdomen: Soft, Nontender, Nondistended, +bowel sounds. Ext: Good pulses. Trace edema.  Data Reviewed: Basic Metabolic Panel:  Recent Labs Lab 06/27/14 1026  NA 138  K 4.0  CL 108  CO2 23  GLUCOSE 93  BUN 7  CREATININE 0.81  CALCIUM 9.0   Liver Function Tests:  Recent Labs Lab 06/27/14 1026  AST 18  ALT 11  ALKPHOS 86  BILITOT 0.3  PROT 8.3  ALBUMIN 3.9   No results for input(s): LIPASE, AMYLASE in the last 168 hours. No results for input(s): AMMONIA in the last 168 hours. CBC:  Recent Labs Lab 06/27/14 1026  WBC 5.6  NEUTROABS 3.6  HGB 10.9*  HCT 33.3*  MCV 71.0*  PLT 242   Cardiac Enzymes: No results for input(s): CKTOTAL, CKMB, CKMBINDEX, TROPONINI in the last 168 hours. BNP (last 3 results) No results for input(s): BNP in the last 8760 hours.  ProBNP (last 3 results) No results for input(s): PROBNP in the last 8760 hours.  CBG:  Recent Labs Lab 06/27/14 2137 06/28/14 0817 06/28/14 1149  GLUCAP 169* 126* 157*    Recent Results (from the past 240 hour(s))  Urine culture     Status: None   Collection Time: 06/27/14  1:03 PM  Result Value Ref Range Status  Specimen Description URINE, CLEAN CATCH  Final   Special Requests NONE  Final   Colony Count   Final    70,000 COLONIES/ML Performed at [facility]    Culture   Final    Multiple bacterial morphotypes present, none predominant. Suggest appropriate recollection if clinically indicated. Performed at [facility]    Report Status 06/28/2014 FINAL  Final     Studies: Ct Head Wo Contrast  06/27/2014   CLINICAL DATA:  Headache with nausea and weakness for 1 day. Multiple sclerosis.  EXAM: CT HEAD  WITHOUT CONTRAST  TECHNIQUE: Contiguous axial images were obtained from the base of the skull through the vertex without intravenous contrast.  COMPARISON:  Brain MRI 12/31/2006  FINDINGS: Skull and Sinuses:Negative for fracture or destructive process. The mastoids, middle ears, and imaged paranasal sinuses are clear.  Orbits: No acute abnormality.  Brain: No evidence of acute infarction, hemorrhage, hydrocephalus, or mass lesion/mass effect. Focal white matter low density around the body of the left lateral ventricle correlating with history of multiple sclerosis. Given long imaging follow-up interval, there has been no significant progression.  IMPRESSION: 1. No acute intracranial disease. 2. Mild white matter disease correlating with history of multiple sclerosis.   Electronically Signed   By: Monte Fantasia M.D.   On: 06/27/2014 11:57   US Abdomen Limited  06/27/2014   CLINICAL DATA:  49 year old female with right chest/right upper quadrant pain for the past 4 weeks.  EXAM: US ABDOMEN LIMITED - RIGHT UPPER QUADRANT  COMPARISON:  None.  FINDINGS: Gallbladder:  No gallstones or wall thickening visualized. No sonographic Murphy sign noted.  Common bile duct:  Diameter: Within normal limits at 3 mm.  Liver:  No focal lesion identified. Main portal vein patent with normal hepatopetal flow. Increased echogenicity of the hepatic parenchyma with coarsening of the echotexture. The adjacent renal parenchyma appears hypoechoic in comparison. Findings suggest hepatic steatosis.  IMPRESSION: 1. No evidence of cholelithiasis or acute cholecystitis. 2. Hepatic steatosis.   Electronically Signed   By: Jacqulynn Cadet M.D.   On: 06/27/2014 13:56    Scheduled Meds: . enoxaparin (LOVENOX) injection  40 mg Subcutaneous Q24H  . insulin aspart  0-9 Units Subcutaneous TID WC  . methylPREDNISolone (SOLU-MEDROL) injection  1,000 mg Intravenous Daily  . pantoprazole  40 mg Oral Daily  . sodium chloride  3 mL Intravenous  Q12H   Continuous Infusions:   Principal Problem:   MS (multiple sclerosis) Active Problems:   Right-sided chest pain   Microcytic anemia   Multiple sclerosis exacerbation   Obesity (BMI 30-39.9)    Laquentin Loudermilk A, MD  [facility] Pager [phone]. If 7PM-7AM, please contact night-coverage at [email or web address], password [facility] 06/28/2014, 4:43 PM  LOS: 0 days

## 2014-06-28 NOTE — Progress Notes (Signed)
UR completed 

## 2014-06-29 LAB — CBC
HCT: 31.1 % — ABNORMAL LOW (ref 36.0–46.0)
Hemoglobin: 10.1 g/dL — ABNORMAL LOW (ref 12.0–15.0)
MCH: 22.9 pg — ABNORMAL LOW (ref 26.0–34.0)
MCHC: 32.5 g/dL (ref 30.0–36.0)
MCV: 70.4 fL — ABNORMAL LOW (ref 78.0–100.0)
PLATELETS: 225 10*3/uL (ref 150–400)
RBC: 4.42 MIL/uL (ref 3.87–5.11)
RDW: 17.8 % — AB (ref 11.5–15.5)
WBC: 13.6 10*3/uL — AB (ref 4.0–10.5)

## 2014-06-29 LAB — BASIC METABOLIC PANEL
ANION GAP: 7 (ref 5–15)
BUN: 18 mg/dL (ref 6–23)
CHLORIDE: 109 mmol/L (ref 96–112)
CO2: 22 mmol/L (ref 19–32)
Calcium: 8.7 mg/dL (ref 8.4–10.5)
Creatinine, Ser: 0.91 mg/dL (ref 0.50–1.10)
GFR calc Af Amer: 86 mL/min — ABNORMAL LOW (ref >90)
GFR calc non Af Amer: 75 mL/min — ABNORMAL LOW (ref >90)
Glucose, Bld: 128 mg/dL — ABNORMAL HIGH (ref 70–99)
Potassium: 3.9 mmol/L (ref 3.5–5.1)
SODIUM: 138 mmol/L (ref 135–145)

## 2014-06-29 LAB — VITAMIN B12: VITAMIN B 12: 363 pg/mL (ref 211–911)

## 2014-06-29 LAB — GLUCOSE, CAPILLARY
GLUCOSE-CAPILLARY: 153 mg/dL — AB (ref 70–99)
Glucose-Capillary: 125 mg/dL — ABNORMAL HIGH (ref 70–99)
Glucose-Capillary: 151 mg/dL — ABNORMAL HIGH (ref 70–99)
Glucose-Capillary: 175 mg/dL — ABNORMAL HIGH (ref 70–99)

## 2014-06-29 NOTE — Progress Notes (Addendum)
Patient ID: [name], female   DOB: 1965/11/16, 49 y.o.   MRN: [id number]  [facility] PROGRESS NOTE  [name] [id number] DOB: 1966/04/12 DOA: 06/27/2014 PCP: Nyoka Cowden, MD   Brief narrative:    49 y.o. female, with multiple sclerosis and noncompliance with medical treatment, presented to [facility] ED with main concern of several days duration of progressively worsening weakness and "freezign up episodes" where she is unable to move or speak. Some episodes last few minutes and can be as long as 30 minutes at the time. She sees Dr. Mancel Bale, neurologist at [facility].   Assessment/Plan:    Multiple sclerosis flare - Appreciate input of neurology.  - Patient completing day 3 of 3 of pulse dose steroids, Solu-Medrol 1 g IV daily 3 days.  - Patient to follow-up with Dr. Doy Mince at [facility] after discharge. - Patient requested a work excuse at the time of discharge as she indicated that she is not able to work in her present condition - plan on d/c in AM  Right-sided chest pain - resolved  - Abdominal ultrasound negative.  Macrocytic anemia - Likely due to chronic disease. Hemoglobin stable at this time - no signs of active bleeding   Obesity - Body mass index is 38.91  DVT prophylaxis - Lovenox SQ  Code Status: DNR Family Communication:  plan of care discussed with the patient and husband at bedside  Disposition Plan: Home in AM  IV access:  Peripheral IV  Procedures and diagnostic studies:     Ct Head Wo Contrast  06/27/2014   No acute intracranial disease. Mild white matter disease c/w history of multiple sclerosis.   US Abdomen 06/27/2014   No evidence of cholelithiasis or acute cholecystitis.  Hepatic steatosis.     Medical Consultants:  Neurology   Other Consultants:  None  IAnti-Infectives:   None  [name], MD  [facility] Pager [phone]  If 7PM-7AM, please contact night-coverage [email or web address] Password  [email or web address] 06/29/2014, 1:29 PM   LOS: 1 day   HPI/Subjective: No events overnight.   Objective: Filed Vitals:   06/28/14 0706 06/28/14 1600 06/28/14 2250 06/29/14 0656  BP: 131/84 115/66 125/66 114/75  Pulse: 86 86 79 72  Temp: 98.3 F (36.8 C) 98.6 F (37 C) 97.9 F (36.6 C) 98.5 F (36.9 C)  TempSrc: Oral Oral Oral Oral  Resp: 16 20 13 15   Height:      Weight:      SpO2: 98% 99% 98% 97%    Intake/Output Summary (Last 24 hours) at 06/29/14 1329 Last data filed at 06/29/14 0600  Gross per 24 hour  Intake     61 ml  Output      1 ml  Net     60 ml    Exam:   General:  Pt is alert, follows commands appropriately, not in acute distress  Cardiovascular: Regular rate and rhythm, S1/S2, no murmurs, no rubs, no gallops  Respiratory: Clear to auscultation bilaterally, no wheezing, no crackles, no rhonchi  Abdomen: Soft, non tender, non distended, bowel sounds present, no guarding  Extremities: No edema, pulses DP and PT palpable bilaterally  Neuro: Grossly nonfocal  Data Reviewed: Basic Metabolic Panel:  Recent Labs Lab 06/27/14 1026 06/29/14 0606  NA 138 138  K 4.0 3.9  CL 108 109  CO2 23 22  GLUCOSE 93 128*  BUN 7 18  CREATININE 0.81 0.91  CALCIUM 9.0 8.7   Liver Function Tests:  Recent Labs Lab 06/27/14 1026  AST 18  ALT 11  ALKPHOS 86  BILITOT 0.3  PROT 8.3  ALBUMIN 3.9   CBC:  Recent Labs Lab 06/27/14 1026 06/29/14 0606  WBC 5.6 13.6*  NEUTROABS 3.6  --   HGB 10.9* 10.1*  HCT 33.3* 31.1*  MCV 71.0* 70.4*  PLT 242 225   CBG:  Recent Labs Lab 06/28/14 1149 06/28/14 1647 06/28/14 2253 06/29/14 0818 06/29/14 1208  GLUCAP 157* 148* 183* 125* 151*    Recent Results (from the past 240 hour(s))  Urine culture     Status: None   Collection Time: 06/27/14  1:03 PM  Result Value Ref Range Status   Specimen Description URINE, CLEAN CATCH  Final   Special Requests NONE  Final   Colony Count   Final    70,000 COLONIES/ML Performed  at [facility]    Culture   Final    Multiple bacterial morphotypes present, none predominant. Suggest appropriate recollection if clinically indicated. Performed at [facility]    Report Status 06/28/2014 FINAL  Final     Scheduled Meds: . enoxaparin (LOVENOX) injection  40 mg Subcutaneous Q24H  . insulin aspart  0-9 Units Subcutaneous TID WC  . pantoprazole  40 mg Oral Daily  . sodium chloride  3 mL Intravenous Q12H   Continuous Infusions:

## 2014-06-30 LAB — CBC
HEMATOCRIT: 30.2 % — AB (ref 36.0–46.0)
HEMOGLOBIN: 9.6 g/dL — AB (ref 12.0–15.0)
MCH: 22.4 pg — ABNORMAL LOW (ref 26.0–34.0)
MCHC: 31.8 g/dL (ref 30.0–36.0)
MCV: 70.6 fL — AB (ref 78.0–100.0)
PLATELETS: 261 10*3/uL (ref 150–400)
RBC: 4.28 MIL/uL (ref 3.87–5.11)
RDW: 18 % — ABNORMAL HIGH (ref 11.5–15.5)
WBC: 10.3 10*3/uL (ref 4.0–10.5)

## 2014-06-30 LAB — BASIC METABOLIC PANEL
Anion gap: 8 (ref 5–15)
BUN: 15 mg/dL (ref 6–23)
CALCIUM: 8.2 mg/dL — AB (ref 8.4–10.5)
CHLORIDE: 107 mmol/L (ref 96–112)
CO2: 22 mmol/L (ref 19–32)
Creatinine, Ser: 0.85 mg/dL (ref 0.50–1.10)
GFR calc Af Amer: >90 mL/min (ref >90)
GFR calc non Af Amer: 81 mL/min — ABNORMAL LOW (ref >90)
GLUCOSE: 116 mg/dL — AB (ref 70–99)
Potassium: 3.7 mmol/L (ref 3.5–5.1)
Sodium: 137 mmol/L (ref 135–145)

## 2014-06-30 LAB — GLUCOSE, CAPILLARY
GLUCOSE-CAPILLARY: 98 mg/dL (ref 70–99)
Glucose-Capillary: 101 mg/dL — ABNORMAL HIGH (ref 70–99)

## 2014-06-30 MED ORDER — PREDNISONE 5 MG PO TABS: ORAL_TABLET | ORAL | Status: DC

## 2014-06-30 NOTE — Discharge Summary (Signed)
Physician Discharge Summary  [name] IRC:[id number] DOB: 01/10/1966 DOA: 2014/07/13  PCP: Kathleen Cowden, MD  Admit date: 07/13/2014 Discharge date: 06/30/2014  Recommendations for Outpatient Follow-up:  1. Pt will need to follow up with PCP in 2-3 weeks post discharge 2. Please obtain BMP to evaluate electrolytes and kidney function 3. Please also check CBC to evaluate Hg and Hct levels  Discharge Diagnoses:  Principal Problem:   MS (multiple sclerosis) Active Problems:   Right-sided chest pain   Microcytic anemia   Multiple sclerosis exacerbation   Obesity (BMI 30-39.9)    Discharge Condition: Stable  Diet recommendation: Heart healthy diet discussed in details     Brief narrative:    49 y.o. female, with multiple sclerosis and noncompliance with medical treatment, presented to [facility] ED with main concern of several days duration of progressively worsening weakness and "freezign up episodes" where she is unable to move or speak. Some episodes last few minutes and can be as long as 30 minutes at the time. She sees Dr. Mancel Bale, neurologist at [facility].   Assessment/Plan:    Multiple sclerosis flare - Appreciate input of neurology.  - Patient completing day 3 of 3 of pulse dose steroids, Solu-Medrol 1 g IV daily 3 days.  - Patient to follow-up with Dr. Doy Mince at [facility] after discharge. - Patient requested a work excuse at the time of discharge as she indicated that she is not able to work in her present condition  Right-sided chest pain - resolved  - Abdominal ultrasound negative.  Macrocytic anemia - Likely due to chronic disease. Hemoglobin stable at this time - no signs of active bleeding   Obesity - Body mass index is 38.91   Code Status: DNR Family Communication: plan of care discussed with the patient and husband at bedside  Disposition Plan: Home today   IV access:  Peripheral IV  Procedures and diagnostic studies:     Ct Head Wo Contrast 07/13/14 No acute intracranial disease. Mild white matter disease c/w history of multiple sclerosis.   US Abdomen July 13, 2014 No evidence of cholelithiasis or acute cholecystitis. Hepatic steatosis.   Medical Consultants:  Neurology   Other Consultants:  None  IAnti-Infectives:   None        Discharge Exam: Filed Vitals:   06/30/14 0548  BP: 125/84  Pulse: 69  Temp: 97.9 F (36.6 C)  Resp: 18   Filed Vitals:   06/29/14 1522 06/29/14 1530 06/29/14 2115 06/30/14 0548  BP: 116/63  113/70 125/84  Pulse:   72 69  Temp:  98 F (36.7 C) 98.5 F (36.9 C) 97.9 F (36.6 C)  TempSrc:  Oral Oral Oral  Resp: 16  18 18   Height:      Weight:      SpO2: 100%  97% 98%    General: Pt is alert, follows commands appropriately, not in acute distress Cardiovascular: Regular rate and rhythm, S1/S2 +, no murmurs, no rubs, no gallops Respiratory: Clear to auscultation bilaterally, no wheezing, no crackles, no rhonchi Abdominal: Soft, non tender, non distended, bowel sounds +, no guarding Extremities: no edema, no cyanosis, pulses palpable bilaterally DP and PT Neuro: Grossly nonfocal  Discharge Instructions  Discharge Instructions    Diet - low sodium heart healthy    Complete by:  As directed      Increase activity slowly    Complete by:  As directed             Medication List  TAKE these medications        acetaminophen 160 MG/5ML solution  Commonly known as:  TYLENOL  Take 900 mg by mouth every 6 (six) hours as needed for mild pain.     cyanocobalamin 1000 MCG/ML injection  Commonly known as:  (VITAMIN B-12)  Inject 1 mL (1,000 mcg total) into the muscle every 30 (thirty) days.     esomeprazole 40 MG capsule  Commonly known as:  NEXIUM  Take 40 mg by mouth daily as needed (for reflux).     interferon beta-1a 30 MCG/0.5ML injection  Commonly known as:  AVONEX PEN  Inject 0.5 mLs (30 mcg total) into the muscle every 7  (seven) days.     predniSONE 5 MG tablet  Commonly known as:  DELTASONE  Take 60 mg tablet today and taper down by 10 mg daily until completed     SYRINGE-NEEDLE (DISP) 3 ML 25G X 1" 3 ML Misc  1 each by Does not apply route every 30 (thirty) days.           Follow-up Information    Follow up with Kathleen Cowden, MD.   Specialty:  Internal Medicine   Contact information:   [place] [address] [phone]       Follow up with Kathleen Ramsay, MD.   Specialty:  Internal Medicine   Why:  As needed, call my cell [phone]    Contact information:   [address] [place] [address] [phone]        The results of significant diagnostics from this hospitalization (including imaging, microbiology, ancillary and laboratory) are listed below for reference.     Microbiology: Recent Results (from the past 240 hour(s))  Urine culture     Status: None   Collection Time: 06/27/14  1:03 PM  Result Value Ref Range Status   Specimen Description URINE, CLEAN CATCH  Final   Special Requests NONE  Final   Colony Count   Final    70,000 COLONIES/ML Performed at [facility]    Culture   Final    Multiple bacterial morphotypes present, none predominant. Suggest appropriate recollection if clinically indicated. Performed at [facility]    Report Status 06/28/2014 FINAL  Final     Labs: Basic Metabolic Panel:  Recent Labs Lab 06/27/14 1026 06/29/14 0606 06/30/14 0616  NA 138 138 137  K 4.0 3.9 3.7  CL 108 109 107  CO2 23 22 22   GLUCOSE 93 128* 116*  BUN 7 18 15   CREATININE 0.81 0.91 0.85  CALCIUM 9.0 8.7 8.2*   Liver Function Tests:  Recent Labs Lab 06/27/14 1026  AST 18  ALT 11  ALKPHOS 86  BILITOT 0.3  PROT 8.3  ALBUMIN 3.9   CBC:  Recent Labs Lab 06/27/14 1026 06/29/14 0606 06/30/14 0616  WBC 5.6 13.6* 10.3  NEUTROABS 3.6  --   --   HGB 10.9* 10.1* 9.6*  HCT 33.3*  31.1* 30.2*  MCV 71.0* 70.4* 70.6*  PLT 242 225 261   CBG:  Recent Labs Lab 06/29/14 0818 06/29/14 1208 06/29/14 1711 06/29/14 2154 06/30/14 0843  GLUCAP 125* 151* 175* 153* 101*     SIGNED: Time coordinating discharge: Over 30 minutes  Kathleen Ramsay, MD  [facility] 06/30/2014, 8:59 AM Pager [phone]  If 7PM-7AM, please contact night-coverage [email or web address] Password [email or web address]

## 2014-06-30 NOTE — Discharge Instructions (Signed)

## 2014-06-30 NOTE — Progress Notes (Signed)
Patient was discharged home by MD order; discharged instructions  review and give to patient with care notes and prescriptions; IV DIC; skin intact; patient refused to wait for case manager to talk about shower chair; patient will be escorted to the car by nurse tech via wheelchair.

## 2014-07-05 ENCOUNTER — Other Ambulatory Visit: Payer: Managed Care, Other (non HMO)

## 2014-07-06 ENCOUNTER — Telehealth: Payer: Self-pay | Admitting: Internal Medicine

## 2014-07-06 NOTE — Telephone Encounter (Signed)
Noted  

## 2014-07-06 NOTE — Telephone Encounter (Signed)
FYI

## 2014-07-06 NOTE — Telephone Encounter (Signed)
Patient Name: [name]  DOB: April 03, 1966    Initial Comment Caller states she had a relapse last week due to [place] and she was in the hospital and had a medication with the IV and now she is having some bruising.    Nurse Assessment  Nurse: Mallie Mussel, RN, Alveta Heimlich Date/Time Eilene Ghazi Time): 07/06/2014 12:39:25 PM  Confirm and document reason for call. If symptomatic, describe symptoms. ---Caller states that she was in the hospital last week for a relapse with MS. She had SoluMedrol through the IV and she was on Prednisone after coming home. She is now having some bruising without injury. She has one on her right wrist and on her right leg close to her knee.  Has the patient traveled out of the country within the last 30 days? ---No  Does the patient require triage? ---Yes  Related visit to physician within the last 2 weeks? ---No  Does the PT have any chronic conditions? (i.e. diabetes, asthma, etc.) ---Yes  List chronic conditions. ---MS, Reflux  Did the patient indicate they were pregnant? ---No     Guidelines    Guideline Title Affirmed Question Affirmed Notes  Bruises [1] Not caused by an injury AND [2] < 5 unexplained bruises    Final Disposition User   See PCP When Office is Open (within 3 days) Mallie Mussel, RN, [place] states that she already has an appointment for Monday.

## 2014-07-09 ENCOUNTER — Ambulatory Visit (INDEPENDENT_AMBULATORY_CARE_PROVIDER_SITE_OTHER): Payer: BLUE CROSS/BLUE SHIELD | Admitting: *Deleted

## 2014-07-09 DIAGNOSIS — Z111 Encounter for screening for respiratory tuberculosis: Secondary | ICD-10-CM

## 2014-07-11 LAB — TB SKIN TEST
INDURATION: 0 mm
TB Skin Test: NEGATIVE

## 2014-07-12 ENCOUNTER — Encounter: Payer: Managed Care, Other (non HMO) | Admitting: Internal Medicine

## 2014-07-24 ENCOUNTER — Ambulatory Visit (INDEPENDENT_AMBULATORY_CARE_PROVIDER_SITE_OTHER): Payer: BLUE CROSS/BLUE SHIELD | Admitting: Internal Medicine

## 2014-07-24 ENCOUNTER — Encounter: Payer: Self-pay | Admitting: Internal Medicine

## 2014-07-24 VITALS — BP 120/80 | HR 83 | Temp 98.9°F | Resp 20 | Ht 63.0 in | Wt 230.0 lb

## 2014-07-24 DIAGNOSIS — D500 Iron deficiency anemia secondary to blood loss (chronic): Secondary | ICD-10-CM

## 2014-07-24 DIAGNOSIS — E538 Deficiency of other specified B group vitamins: Secondary | ICD-10-CM

## 2014-07-24 DIAGNOSIS — L299 Pruritus, unspecified: Secondary | ICD-10-CM

## 2014-07-24 DIAGNOSIS — G35 Multiple sclerosis: Secondary | ICD-10-CM

## 2014-07-24 MED ORDER — HYDROXYZINE HCL 25 MG PO TABS: 25.0000 mg | ORAL_TABLET | Freq: Three times a day (TID) | ORAL | Status: DC | PRN

## 2014-07-24 MED ORDER — CYANOCOBALAMIN 1000 MCG/ML IJ SOLN
1000.0000 ug | Freq: Once | INTRAMUSCULAR | Status: AC
  Administered 2014-07-24: 1000 ug via INTRAMUSCULAR

## 2014-07-24 NOTE — Progress Notes (Signed)
Pre visit review using our clinic review tool, if applicable. No additional management support is needed unless otherwise documented below in the visit note. 

## 2014-07-24 NOTE — Patient Instructions (Addendum)
Pruritus  Pruritus is an itch. There are many different problems that can cause an itch. Dry skin is one of the most common causes of itching. Most cases of itching do not require medical attention.  HOME CARE INSTRUCTIONS  Make sure your skin is moistened on a regular basis. A moisturizer that contains petroleum jelly is best for keeping moisture in your skin. If you develop a rash, you may try the following for relief:   Use corticosteroid cream.  Apply cool compresses to the affected areas.  Bathe with Epsom salts or baking soda in the bathwater.  Soak in colloidal oatmeal baths. These are available at your pharmacy.  Apply baking soda paste to the rash. Stir water into baking soda until it reaches a paste-like consistency.  Use an anti-itch lotion.  Take over-the-counter diphenhydramine medicine by mouth as the instructions direct.  Avoid scratching. Scratching may cause the rash to become infected. If itching is very bad, your caregiver may suggest prescription lotions or creams to lessen your symptoms.  Avoid hot showers, which can make itching worse. A cold shower may help with itching as long as you use a moisturizer after the shower. SEEK MEDICAL CARE IF: The itching does not go away after several days. Document Released: 01/14/2011 Document Revised: 09/18/2013 Document Reviewed: 01/14/2011 [facility] Patient Information 2015 [place], [place]. This information is not intended to replace advice given to you by your health care provider. Make sure you discuss any questions you have with your health care provider.  Discontinue Nexium Pepcid 20 mg twice daily Claritin 1 daily  Use Atarax every 6 hours as needed and at bedtime  Return in one month  Vitamin B12 1 mg by mouth daily Use an iron supplement twice daily

## 2014-07-24 NOTE — Progress Notes (Signed)
Subjective:    Patient ID: [name], female    DOB: Oct 13, 1965, 49 y.o.   MRN: [id number]  HPI  49 year old patient who presents today with a chief complaint of generalized pruritus without rash.  She states that this has been bothersome for several months.  In fact, has had this intermittently over the years.  For the past 3-4 weeks.  Symptoms have increased.  She was hospitalized last month for an exacerbation of MS.  Her last dose of interferon was approximately 2 weeks ago.  She will be starting on oral medications soon.  Pruritus did improve when she was treated with prednisone for her relapse of MS She does have a history of B12 deficiency as well as microcytic anemia and was noted have moderate anemia last month.  She has not taken any B12 injections in over one year.  She does have a history of documented B12 deficiency as well as a relapse off therapy. She continues to have periods  Past Medical History  Diagnosis Date  . GERD 12/30/2006  . PARESTHESIA 12/30/2006  . Multiple sclerosis   . Anemia   . Hypertension   . Headache(784.0)   . Stroke   . Color blind     blue/green    History   Social History  . Marital Status: Married    Spouse Name: [name]  . Number of Children: 0  . Years of Education: College   Occupational History  .  [employer]  . 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 Smoking status: Never Smoker   . Smokeless tobacco: Never Used  . Alcohol Use: No  . Drug Use: No  . Sexual Activity: Not on file   Other Topics Concern  . Not on file   Social History Narrative   Pt lives at home family.   Caffeine Use: 2 sodas daily    Past Surgical History  Procedure Laterality Date  . Tonsillectomy    . Anal intraepithelial neoplasia excision    . Cesarean section    . Myometomy    . Breast biopsy      Family History  Problem Relation Age of Onset  . Stroke Father   . Psychosis Sister   . Hypertension     . Diabetes    . Dementia      Allergies  Allergen Reactions  . Banana   . Penicillins   . Sulfacetamide Sodium     Current Outpatient Prescriptions on File Prior to Visit  Medication Sig Dispense Refill  . acetaminophen (TYLENOL) 160 MG/5ML solution Take 900 mg by mouth every 6 (six) hours as needed for mild pain.    . cyanocobalamin (,VITAMIN B-12,) 1000 MCG/ML injection Inject 1 mL (1,000 mcg total) into the muscle every 30 (thirty) days. 10 mL 1  . esomeprazole (NEXIUM) 40 MG capsule Take 40 mg by mouth daily as needed (for reflux).     . SYRINGE-NEEDLE, DISP, 3 ML 25G X 1" 3 ML MISC 1 each by Does not apply route every 30 (thirty) days. 50 each 1   No current facility-administered medications on file prior to visit.    BP 120/80 mmHg  Pulse 83  Temp(Src) 98.9 F (37.2 C) (Oral)  Resp 20  Ht 5\' 3"  (1.6 m)  Wt 230 lb (104.327 kg)  BMI 40.75 kg/m2  SpO2 98%  LMP 07/02/2014    Review of Systems  Constitutional: Negative.   HENT: Negative  for congestion, dental problem, hearing loss, rhinorrhea, sinus pressure, sore throat and tinnitus.   Eyes: Negative for pain, discharge and visual disturbance.  Respiratory: Negative for cough and shortness of breath.   Cardiovascular: Negative for chest pain, palpitations and leg swelling.  Gastrointestinal: Negative for nausea, vomiting, abdominal pain, diarrhea, constipation, blood in stool and abdominal distention.  Genitourinary: Negative for dysuria, urgency, frequency, hematuria, flank pain, vaginal bleeding, vaginal discharge, difficulty urinating, vaginal pain and pelvic pain.  Musculoskeletal: Negative for joint swelling, arthralgias and gait problem.  Skin: Negative for rash.  Neurological: Negative for dizziness, syncope, speech difficulty, weakness, numbness and headaches.  Hematological: Negative for adenopathy.  Psychiatric/Behavioral: Negative for behavioral problems, dysphoric mood and agitation. The patient is not  nervous/anxious.        Objective:   Physical Exam  Constitutional: She appears well-developed and well-nourished. She appears distressed.  Cardiovascular: Normal rate and regular rhythm.   Pulmonary/Chest: Effort normal and breath sounds normal.  Skin: Skin is warm and dry. No rash noted.          Assessment & Plan:   Generalized pruritus.  Will set up for allergy evaluation.  Will treat with H1 and H2 blocker therapy History of MS History of B12 deficiency.  B12 level was low normal last month.    We'll attempt to control with the daily oral B12 Probable iron deficiency anemia.  Iron supplements encouraged.  Will recheck CBC in 1 month after iron supplementation

## 2014-07-30 ENCOUNTER — Encounter: Payer: Self-pay | Admitting: Internal Medicine

## 2014-07-30 ENCOUNTER — Ambulatory Visit (INDEPENDENT_AMBULATORY_CARE_PROVIDER_SITE_OTHER): Payer: BLUE CROSS/BLUE SHIELD | Admitting: Internal Medicine

## 2014-07-30 ENCOUNTER — Ambulatory Visit (HOSPITAL_COMMUNITY): Payer: BLUE CROSS/BLUE SHIELD | Attending: Cardiology | Admitting: Cardiology

## 2014-07-30 DIAGNOSIS — R600 Localized edema: Secondary | ICD-10-CM | POA: Diagnosis not present

## 2014-07-30 DIAGNOSIS — M79662 Pain in left lower leg: Secondary | ICD-10-CM

## 2014-07-30 DIAGNOSIS — G35 Multiple sclerosis: Secondary | ICD-10-CM

## 2014-07-30 MED ORDER — FUROSEMIDE 40 MG PO TABS: 40.0000 mg | ORAL_TABLET | Freq: Every day | ORAL | Status: DC

## 2014-07-30 NOTE — Progress Notes (Signed)
Subjective:    Patient ID: [name], female    DOB: [birth year], 49 y.o.   MRN: [id number]  HPI  49 year old patient who was hospitalized last month for an exacerbation of MS.  She required parenteral steroids.  She has developed a progressive lower extremity weakness.  The left leg more prominent than the right.  She is also developed left calf discomfort.  No chest pain or shortness of breath.  She is concerned about a possible DVT.  Past Medical History  Diagnosis Date  . GERD 12/30/2006  . PARESTHESIA 12/30/2006  . Multiple sclerosis   . Anemia   . Hypertension   . Headache(784.0)   . Stroke   . Color blind     blue/green    History   Social History  . Marital Status: Married    Spouse Name: [name]  . Number of Children: 0  . 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 Smoking status: Never Smoker   . Smokeless tobacco: Never Used  . Alcohol Use: No  . Drug Use: No  . Sexual Activity: Not on file   Other Topics Concern  . Not on file   Social History Narrative   Pt lives at home family.   Caffeine Use: 2 sodas daily    Past Surgical History  Procedure Laterality Date  . Tonsillectomy    . Anal intraepithelial neoplasia excision    . Cesarean section    . Myometomy    . Breast biopsy      Family History  Problem Relation Age of Onset  . Stroke Father   . Psychosis Sister   . Hypertension    . Diabetes    . Dementia      Allergies  Allergen Reactions  . Banana   . Penicillins   . Sulfacetamide Sodium     Current Outpatient Prescriptions on File Prior to Visit  Medication Sig Dispense Refill  . acetaminophen (TYLENOL) 160 MG/5ML solution Take 900 mg by mouth every 6 (six) hours as needed for mild pain.    . cyanocobalamin (,VITAMIN B-12,) 1000 MCG/ML injection Inject 1 mL (1,000 mcg total) into the muscle every 30 (thirty) days. 10 mL  1  . hydrOXYzine (ATARAX/VISTARIL) 25 MG tablet Take 1 tablet (25 mg total) by mouth 3 (three) times daily as needed. 60 tablet 3  . SYRINGE-NEEDLE, DISP, 3 ML 25G X 1" 3 ML MISC 1 each by Does not apply route every 30 (thirty) days. 50 each 1  . Teriflunomide 7 MG TABS Take by mouth.     No current facility-administered medications on file prior to visit.    BP 110/70 mmHg  Pulse 83  Temp(Src) 98.5 F (36.9 C) (Oral)  Resp 20  Ht 5\' 3"  (1.6 m)  Wt 233 lb (105.688 kg)  BMI 41.28 kg/m2  SpO2 98%  LMP 07/02/2014     Review of Systems  Constitutional: Negative.   HENT: Negative for congestion, dental problem, hearing loss, rhinorrhea, sinus pressure, sore throat and tinnitus.   Eyes: Negative for pain, discharge and visual disturbance.  Respiratory: Negative for cough and shortness of breath.   Cardiovascular: Positive for leg swelling. Negative for chest pain and palpitations.  Gastrointestinal: Negative for nausea, vomiting, abdominal pain, diarrhea, constipation, blood in stool and abdominal distention.  Genitourinary: Negative for dysuria, urgency, frequency, hematuria, flank pain,  vaginal bleeding, vaginal discharge, difficulty urinating, vaginal pain and pelvic pain.  Musculoskeletal: Negative for joint swelling, arthralgias and gait problem.  Skin: Negative for rash.  Neurological: Negative for dizziness, syncope, speech difficulty, weakness, numbness and headaches.  Hematological: Negative for adenopathy.  Psychiatric/Behavioral: Negative for behavioral problems, dysphoric mood and agitation. The patient is not nervous/anxious.        Objective:   Physical Exam  Constitutional: She is oriented to person, place, and time. She appears well-developed and well-nourished.  HENT:  Head: Normocephalic.  Right Ear: External ear normal.  Left Ear: External ear normal.  Mouth/Throat: Oropharynx is clear and moist.  Eyes: Conjunctivae and EOM are normal. Pupils are equal,  round, and reactive to light.  Neck: Normal range of motion. Neck supple. No thyromegaly present.  Cardiovascular: Normal rate, regular rhythm, normal heart sounds and intact distal pulses.   Pulmonary/Chest: Effort normal and breath sounds normal. No respiratory distress. She has no wheezes. She has no rales.  Abdominal: Soft. Bowel sounds are normal. She exhibits no mass. There is no tenderness.  Musculoskeletal: Normal range of motion. She exhibits edema.  Prominent pitting edema distal to both knees, left leg more edematous with calf tenderness  Lymphadenopathy:    She has no cervical adenopathy.  Neurological: She is alert and oriented to person, place, and time.  Skin: Skin is warm and dry. No rash noted.  Psychiatric: She has a normal mood and affect. Her behavior is normal.          Assessment & Plan:   Lower extremity edema.  Rule out left leg DVT.  Will place on a restricted salt diet, elevation, and furosemide therapy.  We'll check a venous Doppler to exclude DVT.   History of MS exacerbation with forced inactivity

## 2014-07-30 NOTE — Patient Instructions (Signed)
Limit your sodium (Salt) intake  Low-Sodium Eating Plan Sodium raises blood pressure and causes water to be held in the body. Getting less sodium from food will help lower your blood pressure, reduce any swelling, and protect your heart, liver, and kidneys. We get sodium by adding salt (sodium chloride) to food. Most of our sodium comes from canned, boxed, and frozen foods. Restaurant foods, fast foods, and pizza are also very high in sodium. Even if you take medicine to lower your blood pressure or to reduce fluid in your body, getting less sodium from your food is important. WHAT IS MY PLAN? Most people should limit their sodium intake to 2,300 mg a day. Your health care provider recommends that you limit your sodium intake to __________ a day.  WHAT DO I NEED TO KNOW ABOUT THIS EATING PLAN? For the low-sodium eating plan, you will follow these general guidelines:  Choose foods with a % Daily Value for sodium of less than 5% (as listed on the food label).   Use salt-free seasonings or herbs instead of table salt or sea salt.   Check with your health care provider or pharmacist before using salt substitutes.   Eat fresh foods.  Eat more vegetables and fruits.  Limit canned vegetables. If you do use them, rinse them well to decrease the sodium.   Limit cheese to 1 oz (28 g) per day.   Eat lower-sodium products, often labeled as "lower sodium" or "no salt added."  Avoid foods that contain monosodium glutamate (MSG). MSG is sometimes added to [place] food and some canned foods.  Check food labels (Nutrition Facts labels) on foods to learn how much sodium is in one serving.  Eat more home-cooked food and less restaurant, buffet, and fast food.  When eating at a restaurant, ask that your food be prepared with less salt or none, if possible.  HOW DO I READ FOOD LABELS FOR SODIUM INFORMATION? The Nutrition Facts label lists the amount of sodium in one serving of the food. If you  eat more than one serving, you must multiply the listed amount of sodium by the number of servings. Food labels may also identify foods as:  Sodium free--Less than 5 mg in a serving.  Very low sodium--35 mg or less in a serving.  Low sodium--140 mg or less in a serving.  Light in sodium--50% less sodium in a serving. For example, if a food that usually has 300 mg of sodium is changed to become light in sodium, it will have 150 mg of sodium.  Reduced sodium--25% less sodium in a serving. For example, if a food that usually has 400 mg of sodium is changed to reduced sodium, it will have 300 mg of sodium. WHAT FOODS CAN I EAT? Grains Low-sodium cereals, including oats, puffed wheat and rice, and shredded wheat cereals. Low-sodium crackers. Unsalted rice and pasta. Lower-sodium bread.  Vegetables Frozen or fresh vegetables. Low-sodium or reduced-sodium canned vegetables. Low-sodium or reduced-sodium tomato sauce and paste. Low-sodium or reduced-sodium tomato and vegetable juices.  Fruits Fresh, frozen, and canned fruit. Fruit juice.  Meat and Other Protein Products Low-sodium canned tuna and salmon. Fresh or frozen meat, poultry, seafood, and fish. Lamb. Unsalted nuts. Dried beans, peas, and lentils without added salt. Unsalted canned beans. Homemade soups without salt. Eggs.  Dairy Milk. Soy milk. Ricotta cheese. Low-sodium or reduced-sodium cheeses. Yogurt.  Condiments Fresh and dried herbs and spices. Salt-free seasonings. Onion and garlic powders. Low-sodium varieties of mustard and ketchup.  Lemon juice.  Fats and Oils Reduced-sodium salad dressings. Unsalted butter.  Other Unsalted popcorn and pretzels.  The items listed above may not be a complete list of recommended foods or beverages. Contact your dietitian for more options. WHAT FOODS ARE NOT RECOMMENDED? Grains Instant hot cereals. Bread stuffing, pancake, and biscuit mixes. Croutons. Seasoned rice or pasta mixes.  Noodle soup cups. Boxed or frozen macaroni and cheese. Self-rising flour. Regular salted crackers. Vegetables Regular canned vegetables. Regular canned tomato sauce and paste. Regular tomato and vegetable juices. Frozen vegetables in sauces. Salted french fries. Olives. Angie Fava. Relishes. Sauerkraut. Salsa. Meat and Other Protein Products Salted, canned, smoked, spiced, or pickled meats, seafood, or fish. Bacon, ham, sausage, hot dogs, corned beef, chipped beef, and packaged luncheon meats. Salt pork. Jerky. Pickled herring. Anchovies, regular canned tuna, and sardines. Salted nuts. Dairy Processed cheese and cheese spreads. Cheese curds. Blue cheese and cottage cheese. Buttermilk.  Condiments Onion and garlic salt, seasoned salt, table salt, and sea salt. Canned and packaged gravies. Worcestershire sauce. Tartar sauce. Barbecue sauce. Teriyaki sauce. Soy sauce, including reduced sodium. Steak sauce. Fish sauce. Oyster sauce. Cocktail sauce. Horseradish. Regular ketchup and mustard. Meat flavorings and tenderizers. Bouillon cubes. Hot sauce. Tabasco sauce. Marinades. Taco seasonings. Relishes. Fats and Oils Regular salad dressings. Salted butter. Margarine. Ghee. Bacon fat.  Other Potato and tortilla chips. Corn chips and puffs. Salted popcorn and pretzels. Canned or dried soups. Pizza. Frozen entrees and pot pies.  The items listed above may not be a complete list of foods and beverages to avoid. Contact your dietitian for more information. Document Released: 10/24/2001 Document Revised: 05/09/2013 Document Reviewed: 03/08/2013 [facility] Patient Information 2015 [place], [place]. This information is not intended to replace advice given to you by your health care provider. Make sure you discuss any questions you have with your health care provider.

## 2014-07-30 NOTE — Progress Notes (Signed)
Subjective:    Patient ID: [name], female    DOB: [birth year], 49 y.o.   MRN: [id number]  HPI  Wt Readings from Last 3 Encounters:  07/30/14 233 lb (105.688 kg)  07/24/14 230 lb (104.327 kg)  06/27/14 219 lb 9.3 oz (20.66 kg)   49 year old patient who is seen today with a chief complaint of lower extremity edema.  She has been hostile/recently for an exacerbation of MS and has required steroid therapy.  Denies any pulmonary symptoms.  She does attempt to adhere to a low-salt diet.  Her MS has been stable  Past Medical History  Diagnosis Date  . GERD 12/30/2006  . PARESTHESIA 12/30/2006  . Multiple sclerosis   . Anemia   . Hypertension   . Headache(784.0)   . Stroke   . Color blind     blue/green    History   Social History  . Marital Status: Married    Spouse Name: [name]  . Number of Children: 0  . Years of Education: College   Occupational History  .  [employer]  . 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 Smoking status: Never Smoker   . Smokeless tobacco: Never Used  . Alcohol Use: No  . Drug Use: No  . Sexual Activity: Not on file   Other Topics Concern  . Not on file   Social History Narrative   Pt lives at home family.   Caffeine Use: 2 sodas daily    Past Surgical History  Procedure Laterality Date  . Tonsillectomy    . Anal intraepithelial neoplasia excision    . Cesarean section    . Myometomy    . Breast biopsy      Family History  Problem Relation Age of Onset  . Stroke Father   . Psychosis Sister   . Hypertension    . Diabetes    . Dementia      Allergies  Allergen Reactions  . Banana   . Penicillins   . Sulfacetamide Sodium     Current Outpatient Prescriptions on File Prior to Visit  Medication Sig Dispense Refill  . acetaminophen (TYLENOL) 160 MG/5ML solution Take 900 mg by mouth every 6 (six) hours as needed for mild pain.    . cyanocobalamin (,VITAMIN B-12,) 1000 MCG/ML  injection Inject 1 mL (1,000 mcg total) into the muscle every 30 (thirty) days. 10 mL 1  . hydrOXYzine (ATARAX/VISTARIL) 25 MG tablet Take 1 tablet (25 mg total) by mouth 3 (three) times daily as needed. 60 tablet 3  . SYRINGE-NEEDLE, DISP, 3 ML 25G X 1" 3 ML MISC 1 each by Does not apply route every 30 (thirty) days. 50 each 1  . Teriflunomide 7 MG TABS Take by mouth.     No current facility-administered medications on file prior to visit.    BP 110/70 mmHg  Pulse 83  Temp(Src) 98.5 F (36.9 C) (Oral)  Resp 20  Ht 5\' 3"  (1.6 m)  Wt 233 lb (105.688 kg)  BMI 41.28 kg/m2  SpO2 98%  LMP 07/02/2014      Review of Systems  Constitutional: Negative.   HENT: Negative for congestion, dental problem, hearing loss, rhinorrhea, sinus pressure, sore throat and tinnitus.   Eyes: Negative for pain, discharge and visual disturbance.  Respiratory: Negative for cough and shortness of breath.   Cardiovascular: Positive for leg swelling. Negative for chest pain and palpitations.  Gastrointestinal: Negative for nausea, vomiting,  abdominal pain, diarrhea, constipation, blood in stool and abdominal distention.  Genitourinary: Negative for dysuria, urgency, frequency, hematuria, flank pain, vaginal bleeding, vaginal discharge, difficulty urinating, vaginal pain and pelvic pain.  Musculoskeletal: Negative for joint swelling, arthralgias and gait problem.  Skin: Negative for rash.  Neurological: Negative for dizziness, syncope, speech difficulty, weakness, numbness and headaches.  Hematological: Negative for adenopathy.  Psychiatric/Behavioral: Negative for behavioral problems, dysphoric mood and agitation. The patient is not nervous/anxious.        Objective:   Physical Exam  Constitutional: She is oriented to person, place, and time. She appears well-developed and well-nourished.  HENT:  Head: Normocephalic.  Right Ear: External ear normal.  Left Ear: External ear normal.  Mouth/Throat:  Oropharynx is clear and moist.  Eyes: Conjunctivae and EOM are normal. Pupils are equal, round, and reactive to light.  Neck: Normal range of motion. Neck supple. No thyromegaly present.  Cardiovascular: Normal rate, regular rhythm, normal heart sounds and intact distal pulses.   Pulmonary/Chest: Effort normal and breath sounds normal.  Abdominal: Soft. Bowel sounds are normal. She exhibits no mass. There is no tenderness.  Musculoskeletal: Normal range of motion. She exhibits edema.  Lower extremity edema distal to the knees  Lymphadenopathy:    She has no cervical adenopathy.  Neurological: She is alert and oriented to person, place, and time.  Skin: Skin is warm and dry. No rash noted.  Psychiatric: She has a normal mood and affect. Her behavior is normal.          Assessment & Plan:  Lower extremity edema.  Multifactorial.  Will place on a low-salt diet and attempt to elevate.  Will treat with furosemide 20 mg daily. Left leg and even more prominent than right.  Will check venous Doppler to rule out DVT MS stable  Recheck one month

## 2014-07-30 NOTE — Progress Notes (Signed)
Pre visit review using our clinic review tool, if applicable. No additional management support is needed unless otherwise documented below in the visit note. 

## 2014-07-30 NOTE — Progress Notes (Signed)
Lt LE Venous duplex performed. Prelim negative for DVT

## 2014-08-21 ENCOUNTER — Encounter: Payer: Self-pay | Admitting: Internal Medicine

## 2014-08-21 ENCOUNTER — Ambulatory Visit (INDEPENDENT_AMBULATORY_CARE_PROVIDER_SITE_OTHER): Payer: BLUE CROSS/BLUE SHIELD | Admitting: Internal Medicine

## 2014-08-21 DIAGNOSIS — G35 Multiple sclerosis: Secondary | ICD-10-CM | POA: Diagnosis not present

## 2014-08-21 DIAGNOSIS — R600 Localized edema: Secondary | ICD-10-CM

## 2014-08-21 DIAGNOSIS — K219 Gastro-esophageal reflux disease without esophagitis: Secondary | ICD-10-CM | POA: Diagnosis not present

## 2014-08-21 DIAGNOSIS — E538 Deficiency of other specified B group vitamins: Secondary | ICD-10-CM | POA: Diagnosis not present

## 2014-08-21 MED ORDER — CYANOCOBALAMIN 1000 MCG/ML IJ SOLN
1000.0000 ug | Freq: Once | INTRAMUSCULAR | Status: AC
  Administered 2014-08-21: 1000 ug via INTRAMUSCULAR

## 2014-08-21 NOTE — Progress Notes (Signed)
Subjective:    Patient ID: [name], female    DOB: Nov 05, 1965, 49 y.o.   MRN: [id number]  HPI  Wt Readings from Last 3 Encounters:  08/21/14 231 lb (104.781 kg)  07/30/14 233 lb (105.688 kg)  07/24/14 230 lb (104.74 kg)   49 year old patient who is seen today for follow-up of lower extremity edema.  This occurred in the setting of a hospitalization for exacerbation of MS that required parenteral steroids.  She has been on furosemide and now just takes this 3 times weekly and the edema has resolved.  She generally feels well.  She is followed closely by neurology She has B12 deficiency and gets monthly B12 shots here  Past Medical History  Diagnosis Date  . GERD 12/30/2006  . PARESTHESIA 12/30/2006  . Multiple sclerosis   . Anemia   . Hypertension   . Headache(784.0)   . Stroke   . Color blind     blue/green    History   Social History  . Marital Status: Married    Spouse Name: [name]  . Number of Children: 0  . Years of Education: College   Occupational History  .  [place]  . RESEARCH ANALYST [employer]   Social History Main Topics  . Smoking status: Never Smoker   . Smokeless tobacco: Never Used  . Alcohol Use: No  . Drug Use: No  . Sexual Activity: Not on file   Other Topics Concern  . Not on file   Social History Narrative   Pt lives at home family.   Caffeine Use: 2 sodas daily    Past Surgical History  Procedure Laterality Date  . Tonsillectomy    . Anal intraepithelial neoplasia excision    . Cesarean section    . Myometomy    . Breast biopsy      Family History  Problem Relation Age of Onset  . Stroke Father   . Psychosis Sister   . Hypertension    . Diabetes    . Dementia      Allergies  Allergen Reactions  . Banana   . Penicillins   . Sulfacetamide Sodium     Current Outpatient Prescriptions on File Prior to Visit  Medication Sig Dispense Refill  . acetaminophen (TYLENOL) 160 MG/5ML solution Take  900 mg by mouth every 6 (six) hours as needed for mild pain.    . cyanocobalamin (,VITAMIN B-12,) 1000 MCG/ML injection Inject 1 mL (1,000 mcg total) into the muscle every 30 (thirty) days. 10 mL 1  . famotidine (PEPCID) 20 MG tablet Take 20 mg by mouth 2 (two) times daily. OTC    . furosemide (LASIX) 40 MG tablet Take 1 tablet (40 mg total) by mouth daily. 30 tablet 3  . hydrOXYzine (ATARAX/VISTARIL) 25 MG tablet Take 1 tablet (25 mg total) by mouth 3 (three) times daily as needed. 60 tablet 3  . Teriflunomide 7 MG TABS Take by mouth.     No current facility-administered medications on file prior to visit.    BP 136/80 mmHg  Pulse 90  Temp(Src) 98 F (36.7 C) (Oral)  Resp 20  Ht 5\' 3"  (1.6 m)  Wt 231 lb (104.781 kg)  BMI 40.93 kg/m2  SpO2 98%  LMP 07/02/2014      Review of Systems  Constitutional: Negative.   HENT: Negative for congestion, dental problem, hearing loss, rhinorrhea, sinus pressure, sore throat and tinnitus.   Eyes: Negative for pain, discharge and  visual disturbance.  Respiratory: Negative for cough and shortness of breath.   Cardiovascular: Positive for leg swelling. Negative for chest pain and palpitations.  Gastrointestinal: Negative for nausea, vomiting, abdominal pain, diarrhea, constipation, blood in stool and abdominal distention.  Genitourinary: Negative for dysuria, urgency, frequency, hematuria, flank pain, vaginal bleeding, vaginal discharge, difficulty urinating, vaginal pain and pelvic pain.  Musculoskeletal: Negative for joint swelling, arthralgias and gait problem.  Skin: Negative for rash.  Neurological: Negative for dizziness, syncope, speech difficulty, weakness, numbness and headaches.  Hematological: Negative for adenopathy.  Psychiatric/Behavioral: Negative for behavioral problems, dysphoric mood and agitation. The patient is not nervous/anxious.        Objective:   Physical Exam  Constitutional: She appears well-developed and  well-nourished. No distress.  Neck: No JVD present.  Cardiovascular: Normal rate and regular rhythm.   Pulmonary/Chest: Effort normal and breath sounds normal.  Musculoskeletal: She exhibits edema.          Assessment & Plan:   Lower extremity edema.  Now resolved.  We'll continue low-salt diet and use furosemide when necessary only B12 deficiency.  B12 shot dispensed today

## 2014-08-21 NOTE — Patient Instructions (Signed)
Limit your sodium (Salt) intake  Call or return to clinic prn if these symptoms worsen or fail to improve as anticipated.   

## 2014-08-21 NOTE — Progress Notes (Signed)
Pre visit review using our clinic review tool, if applicable. No additional management support is needed unless otherwise documented below in the visit note. 

## 2014-09-07 ENCOUNTER — Telehealth: Payer: Self-pay | Admitting: Internal Medicine

## 2014-09-07 NOTE — Telephone Encounter (Signed)
Pt was put on new med for her MS.  Pt given order for labs and woulsd like. to get here. Pt will need standing order for these labs every 4 weeks.  Is it ok to sche?Marland Kitchen

## 2014-09-10 NOTE — Telephone Encounter (Signed)
[name], pt can have labs drawn here if she brings orders with her.

## 2014-09-10 NOTE — Telephone Encounter (Signed)
Pt aware to bring order

## 2014-09-14 ENCOUNTER — Other Ambulatory Visit (INDEPENDENT_AMBULATORY_CARE_PROVIDER_SITE_OTHER): Payer: BLUE CROSS/BLUE SHIELD

## 2014-09-14 DIAGNOSIS — G35 Multiple sclerosis: Secondary | ICD-10-CM | POA: Diagnosis not present

## 2014-09-14 LAB — HEPATIC FUNCTION PANEL
ALK PHOS: 76 U/L (ref 39–117)
ALT: 7 U/L (ref 0–35)
AST: 11 U/L (ref 0–37)
Albumin: 3.7 g/dL (ref 3.5–5.2)
BILIRUBIN TOTAL: 0.4 mg/dL (ref 0.2–1.2)
Bilirubin, Direct: 0.1 mg/dL (ref 0.0–0.3)
Total Protein: 7.3 g/dL (ref 6.0–8.3)

## 2014-09-20 ENCOUNTER — Ambulatory Visit: Payer: BLUE CROSS/BLUE SHIELD | Admitting: *Deleted

## 2014-09-21 ENCOUNTER — Ambulatory Visit (INDEPENDENT_AMBULATORY_CARE_PROVIDER_SITE_OTHER): Payer: BLUE CROSS/BLUE SHIELD | Admitting: *Deleted

## 2014-09-21 DIAGNOSIS — E538 Deficiency of other specified B group vitamins: Secondary | ICD-10-CM

## 2014-09-21 MED ORDER — CYANOCOBALAMIN 1000 MCG/ML IJ SOLN
1000.0000 ug | Freq: Once | INTRAMUSCULAR | Status: AC
  Administered 2014-09-21: 1000 ug via INTRAMUSCULAR

## 2014-10-22 ENCOUNTER — Ambulatory Visit (INDEPENDENT_AMBULATORY_CARE_PROVIDER_SITE_OTHER): Payer: BLUE CROSS/BLUE SHIELD | Admitting: *Deleted

## 2014-10-22 ENCOUNTER — Other Ambulatory Visit (INDEPENDENT_AMBULATORY_CARE_PROVIDER_SITE_OTHER): Payer: BLUE CROSS/BLUE SHIELD

## 2014-10-22 DIAGNOSIS — E538 Deficiency of other specified B group vitamins: Secondary | ICD-10-CM

## 2014-10-22 DIAGNOSIS — G35 Multiple sclerosis: Secondary | ICD-10-CM

## 2014-10-22 LAB — HEPATIC FUNCTION PANEL
ALK PHOS: 79 U/L (ref 39–117)
ALT: 9 U/L (ref 0–35)
AST: 17 U/L (ref 0–37)
Albumin: 4 g/dL (ref 3.5–5.2)
BILIRUBIN DIRECT: 0 mg/dL (ref 0.0–0.3)
Total Bilirubin: 0.4 mg/dL (ref 0.2–1.2)
Total Protein: 7.5 g/dL (ref 6.0–8.3)

## 2014-10-22 MED ORDER — CYANOCOBALAMIN 1000 MCG/ML IJ SOLN
1000.0000 ug | Freq: Once | INTRAMUSCULAR | Status: AC
  Administered 2014-10-22: 1000 ug via INTRAMUSCULAR

## 2014-11-01 ENCOUNTER — Telehealth: Payer: Self-pay | Admitting: Internal Medicine

## 2014-11-01 NOTE — Telephone Encounter (Signed)
Labs re faxed again, confirmation received.

## 2014-11-01 NOTE — Telephone Encounter (Signed)
Pt states the labs done here every month were not received by her dr. Abbott Pao wants to know if you can re fax those results.  The dr fax: [phone]

## 2014-11-21 ENCOUNTER — Ambulatory Visit: Payer: BLUE CROSS/BLUE SHIELD | Admitting: *Deleted

## 2014-11-22 ENCOUNTER — Ambulatory Visit (INDEPENDENT_AMBULATORY_CARE_PROVIDER_SITE_OTHER): Payer: BLUE CROSS/BLUE SHIELD | Admitting: *Deleted

## 2014-11-22 ENCOUNTER — Other Ambulatory Visit (INDEPENDENT_AMBULATORY_CARE_PROVIDER_SITE_OTHER): Payer: BLUE CROSS/BLUE SHIELD

## 2014-11-22 DIAGNOSIS — G35 Multiple sclerosis: Secondary | ICD-10-CM | POA: Diagnosis not present

## 2014-11-22 DIAGNOSIS — E538 Deficiency of other specified B group vitamins: Secondary | ICD-10-CM | POA: Diagnosis not present

## 2014-11-22 LAB — HEPATIC FUNCTION PANEL
ALK PHOS: 72 U/L (ref 39–117)
ALT: 12 U/L (ref 0–35)
AST: 17 U/L (ref 0–37)
Albumin: 3.8 g/dL (ref 3.5–5.2)
Bilirubin, Direct: 0.1 mg/dL (ref 0.0–0.3)
Total Bilirubin: 0.4 mg/dL (ref 0.2–1.2)
Total Protein: 7.5 g/dL (ref 6.0–8.3)

## 2014-11-22 MED ORDER — CYANOCOBALAMIN 1000 MCG/ML IJ SOLN
1000.0000 ug | Freq: Once | INTRAMUSCULAR | Status: AC
  Administered 2014-11-22: 1000 ug via INTRAMUSCULAR

## 2014-11-29 ENCOUNTER — Other Ambulatory Visit: Payer: Self-pay

## 2014-11-29 ENCOUNTER — Encounter (HOSPITAL_COMMUNITY): Payer: Self-pay | Admitting: Emergency Medicine

## 2014-11-29 ENCOUNTER — Emergency Department (HOSPITAL_COMMUNITY)
Admission: EM | Admit: 2014-11-29 | Discharge: 2014-11-29 | Disposition: A | Discharge status: Home / Self Care | Payer: BLUE CROSS/BLUE SHIELD | Attending: Emergency Medicine | Admitting: Emergency Medicine

## 2014-11-29 DIAGNOSIS — Z8673 Personal history of transient ischemic attack (TIA), and cerebral infarction without residual deficits: Secondary | ICD-10-CM | POA: Insufficient documentation

## 2014-11-29 DIAGNOSIS — D649 Anemia, unspecified: Secondary | ICD-10-CM | POA: Insufficient documentation

## 2014-11-29 DIAGNOSIS — Z880 Allergy status to penicillin: Secondary | ICD-10-CM | POA: Diagnosis not present

## 2014-11-29 DIAGNOSIS — G259 Extrapyramidal and movement disorder, unspecified: Secondary | ICD-10-CM | POA: Diagnosis not present

## 2014-11-29 DIAGNOSIS — I10 Essential (primary) hypertension: Secondary | ICD-10-CM | POA: Diagnosis not present

## 2014-11-29 DIAGNOSIS — G35 Multiple sclerosis: Secondary | ICD-10-CM | POA: Insufficient documentation

## 2014-11-29 DIAGNOSIS — K219 Gastro-esophageal reflux disease without esophagitis: Secondary | ICD-10-CM | POA: Insufficient documentation

## 2014-11-29 DIAGNOSIS — R569 Unspecified convulsions: Secondary | ICD-10-CM | POA: Diagnosis present

## 2014-11-29 LAB — CBG MONITORING, ED: Glucose-Capillary: 121 mg/dL — ABNORMAL HIGH (ref 65–99)

## 2014-11-29 LAB — CBC WITH DIFFERENTIAL/PLATELET
Basophils Absolute: 0 10*3/uL (ref 0.0–0.1)
Basophils Relative: 1 % (ref 0–1)
Eosinophils Absolute: 0.1 10*3/uL (ref 0.0–0.7)
Eosinophils Relative: 2 % (ref 0–5)
HCT: 35.3 % — ABNORMAL LOW (ref 36.0–46.0)
Hemoglobin: 11.8 g/dL — ABNORMAL LOW (ref 12.0–15.0)
LYMPHS PCT: 27 % (ref 12–46)
Lymphs Abs: 1.5 10*3/uL (ref 0.7–4.0)
MCH: 24.6 pg — ABNORMAL LOW (ref 26.0–34.0)
MCHC: 33.4 g/dL (ref 30.0–36.0)
MCV: 73.5 fL — AB (ref 78.0–100.0)
Monocytes Absolute: 0.7 10*3/uL (ref 0.1–1.0)
Monocytes Relative: 13 % — ABNORMAL HIGH (ref 3–12)
NEUTROS ABS: 3.4 10*3/uL (ref 1.7–7.7)
NEUTROS PCT: 58 % (ref 43–77)
Platelets: 186 10*3/uL (ref 150–400)
RBC: 4.8 MIL/uL (ref 3.87–5.11)
RDW: 18.6 % — ABNORMAL HIGH (ref 11.5–15.5)
WBC: 5.8 10*3/uL (ref 4.0–10.5)

## 2014-11-29 LAB — I-STAT CHEM 8, ED
BUN: 13 mg/dL (ref 6–20)
CALCIUM ION: 0.96 mmol/L — AB (ref 1.12–1.23)
CHLORIDE: 107 mmol/L (ref 101–111)
Creatinine, Ser: 0.9 mg/dL (ref 0.44–1.00)
GLUCOSE: 104 mg/dL — AB (ref 65–99)
HCT: 40 % (ref 36.0–46.0)
Hemoglobin: 13.6 g/dL (ref 12.0–15.0)
Potassium: 4.1 mmol/L (ref 3.5–5.1)
SODIUM: 140 mmol/L (ref 135–145)
TCO2: 23 mmol/L (ref 0–100)

## 2014-11-29 MED ORDER — LEVETIRACETAM 100 MG/ML PO SOLN: 500.0000 mg | Freq: Two times a day (BID) | ORAL | Status: DC

## 2014-11-29 MED ORDER — SODIUM CHLORIDE 0.9 % IV SOLN
1000.0000 mg | Freq: Once | INTRAVENOUS | Status: AC
  Administered 2014-11-29: 1000 mg via INTRAVENOUS
  Filled 2014-11-29: qty 10

## 2014-11-29 NOTE — ED Provider Notes (Signed)
CSN: [id number]     Arrival date & time 11/29/14  0032 History  This chart was scribed for Maleeha Halls, MD by Chester Holstein, ED Scribe. This patient was seen in room A01C/A01C and the patient's care was started at 1:09 AM.    Chief Complaint  Patient presents with  . Seizures     The history is provided by the patient and the spouse. No language interpreter was used.   HPI Comments: [name] is a 49 y.o. female brought in by ambulance, with PMHx of HTN, anemia, multiple sclerosis, and CVA who presents to the Emergency Department complaining of episode of possible seizure with onset this evening. Pt was alert and aware during the episode, noting head jerking and generalized body twitching, but was unable to speak. Pt used her leg, hitting the floor, to catch her husband's attention. Pt's husband reports this is not her first episode, stating she has had similar events since 2008 which have worsened in last several months. She reports she feels chills before each episode and they typically set off an MS flare-up. She notes last episode she was seen for in ED was on 06/27/2014. Pt was admitted for 4 days. Pt has been evaluated with neurologist with no significant findings. Last EEG showed normal results but does not r/o epilepsy. Pt is scheduled to be set up with an ambulatory EEG at home.  Pt has been prescribed Keppra but pt has not taken it yet. She is also on  Aubagio. Pt denies changes in environment, cold-like symptoms, dysuria, recent trauma, bowel or bladder incontinence, and rash.   Past Medical History  Diagnosis Date  . GERD 12/30/2006  . PARESTHESIA 12/30/2006  . Multiple sclerosis   . Anemia   . Hypertension   . Headache(784.0)   . Stroke   . Color blind     blue/green   Past Surgical History  Procedure Laterality Date  . Tonsillectomy    . Anal intraepithelial neoplasia excision    . Cesarean section    . Myometomy    . Breast biopsy     Family History   Problem Relation Age of Onset  . Stroke Father   . Psychosis Sister   . Hypertension    . Diabetes    . Dementia     History  Substance Use Topics  . Smoking status: Never Smoker   . Smokeless tobacco: Never Used  . Alcohol Use: No   OB History    No data available     Review of Systems  Constitutional: Negative for fever.  Gastrointestinal: Negative for bowel incontinence.  Genitourinary: Negative for bladder incontinence.  Musculoskeletal: Negative for arthralgias, gait problem and neck pain.  Neurological: Positive for seizures. Negative for dizziness, facial asymmetry, speech difficulty, weakness and numbness.  All other systems reviewed and are negative.     Allergies  Banana; Penicillins; and Sulfacetamide sodium  Home Medications   Prior to Admission medications   Medication Sig Start Date End Date Taking? Authorizing Provider  acetaminophen (TYLENOL) 160 MG/5ML solution Take 900 mg by mouth every 6 (six) hours as needed for mild pain.    Historical Provider, MD  cyanocobalamin (,VITAMIN B-12,) 1000 MCG/ML injection Inject 1 mL (1,000 mcg total) into the muscle every 30 (thirty) days. 06/06/13   Marletta Lor, MD  famotidine (PEPCID) 20 MG tablet Take 20 mg by mouth 2 (two) times daily. OTC    Historical Provider, MD  furosemide (LASIX) 40 MG tablet  Take 1 tablet (40 mg total) by mouth daily. 07/30/14   Marletta Lor, MD  hydrOXYzine (ATARAX/VISTARIL) 25 MG tablet Take 1 tablet (25 mg total) by mouth 3 (three) times daily as needed. 07/24/14   Marletta Lor, MD  Teriflunomide 7 MG TABS Take by mouth. 07/12/14   Historical Provider, MD   SpO2 99%  LMP 09/29/2014 Physical Exam  Constitutional: She is oriented to person, place, and time. She appears well-developed and well-nourished. No distress.  HENT:  Head: Normocephalic.  Mouth/Throat: Oropharynx is clear and moist and mucous membranes are normal. No oropharyngeal exudate.  Eyes: Conjunctivae  and EOM are normal. Pupils are equal, round, and reactive to light.  Neck: Normal range of motion. Neck supple.  Cardiovascular: Normal rate, regular rhythm and normal heart sounds.  Exam reveals no friction rub.   No murmur heard. Pulmonary/Chest: Effort normal and breath sounds normal. No stridor. No respiratory distress. She has no wheezes. She has no rales.  Abdominal: Soft. Bowel sounds are normal. There is no tenderness. There is no rebound and no guarding.  Musculoskeletal: Normal range of motion. She exhibits no edema or tenderness.  Neurological: She is alert and oriented to person, place, and time. She has normal reflexes.  5/5 strength in lower extremities  Skin: Skin is warm and dry.  Psychiatric: She has a normal mood and affect. Her behavior is normal.  Nursing note and vitals reviewed.   ED Course  Procedures (including critical care time) DIAGNOSTIC STUDIES: Oxygen Saturation is 99% on room air, normal by my interpretation.    COORDINATION OF CARE: 1:15 AM Discussed treatment plan with patient at beside, the patient agrees with the plan and has no further questions at this time.   Labs Review Labs Reviewed  CBG MONITORING, ED    Imaging Review No results found.   EKG Interpretation None      EKG Interpretation  Date/Time:  Thursday November 29 2014 00:51:47 EDT Ventricular Rate:  73 PR Interval:  155 QRS Duration: 74 QT Interval:  432 QTC Calculation: 476 R Axis:   40 Text Interpretation:  Sinus rhythm Nonspecific T abnormalities, anterior leads Confirmed by [facility]  MD, Alcide Memoli ([id number]) on 11/29/2014 1:43:21 AM      Medications  levETIRAcetam (KEPPRA) 1,000 mg in sodium chloride 0.9 % 100 mL IVPB (0 mg Intravenous Stopped 11/29/14 0225)    MDM   Final diagnoses:  None  Case d/w Dr. Armida Sans, no need for repeat CT scan start IV keppra and then PO keppra BID.  No need for admission follow up with Dr. Doy Mince your regular neurologist this week  I  personally performed the services described in this documentation, which was scribed in my presence. The recorded information has been reviewed and is accurate.      Veatrice Kells, MD 11/29/14 [phone]

## 2014-11-29 NOTE — ED Notes (Signed)
Pt presents with [facility] for possible seizures; pt able to recall series of events leading to calling 911; pt reports having "jerking and twitching" of head for approx 1 min; pt denies loss of bowel or bladder; pt denies postictal symptoms; pt CAOx4 at this time; pt reports numbness in bilateral hands; pt reports hx of MS; pt denies hx of seizures

## 2014-12-06 ENCOUNTER — Ambulatory Visit: Payer: BLUE CROSS/BLUE SHIELD | Admitting: Internal Medicine

## 2014-12-25 ENCOUNTER — Other Ambulatory Visit (INDEPENDENT_AMBULATORY_CARE_PROVIDER_SITE_OTHER): Payer: BLUE CROSS/BLUE SHIELD

## 2014-12-25 ENCOUNTER — Ambulatory Visit (INDEPENDENT_AMBULATORY_CARE_PROVIDER_SITE_OTHER): Payer: BLUE CROSS/BLUE SHIELD | Admitting: *Deleted

## 2014-12-25 DIAGNOSIS — G35 Multiple sclerosis: Secondary | ICD-10-CM | POA: Diagnosis not present

## 2014-12-25 DIAGNOSIS — E538 Deficiency of other specified B group vitamins: Secondary | ICD-10-CM

## 2014-12-25 LAB — HEPATIC FUNCTION PANEL
ALBUMIN: 4 g/dL (ref 3.5–5.2)
ALT: 10 U/L (ref 0–35)
AST: 15 U/L (ref 0–37)
Alkaline Phosphatase: 71 U/L (ref 39–117)
Bilirubin, Direct: 0.1 mg/dL (ref 0.0–0.3)
Total Bilirubin: 0.3 mg/dL (ref 0.2–1.2)
Total Protein: 7.5 g/dL (ref 6.0–8.3)

## 2014-12-25 MED ORDER — CYANOCOBALAMIN 1000 MCG/ML IJ SOLN
1000.0000 ug | Freq: Once | INTRAMUSCULAR | Status: AC
  Administered 2014-12-25: 1000 ug via INTRAMUSCULAR

## 2015-01-04 ENCOUNTER — Telehealth: Payer: Self-pay | Admitting: Internal Medicine

## 2015-01-04 NOTE — Telephone Encounter (Signed)
Pt call to say her labs are suppose to be faxed over to her MS doctor in [facility]   Fax number [phone].

## 2015-01-04 NOTE — Telephone Encounter (Signed)
Pt notified labs were faxed to [facility].

## 2015-01-25 ENCOUNTER — Ambulatory Visit (INDEPENDENT_AMBULATORY_CARE_PROVIDER_SITE_OTHER): Payer: BLUE CROSS/BLUE SHIELD | Admitting: *Deleted

## 2015-01-25 ENCOUNTER — Other Ambulatory Visit (INDEPENDENT_AMBULATORY_CARE_PROVIDER_SITE_OTHER): Payer: BLUE CROSS/BLUE SHIELD

## 2015-01-25 DIAGNOSIS — G35 Multiple sclerosis: Secondary | ICD-10-CM | POA: Diagnosis not present

## 2015-01-25 DIAGNOSIS — E538 Deficiency of other specified B group vitamins: Secondary | ICD-10-CM | POA: Diagnosis not present

## 2015-01-25 LAB — HEPATIC FUNCTION PANEL
ALT: 8 U/L (ref 0–35)
AST: 13 U/L (ref 0–37)
Albumin: 4 g/dL (ref 3.5–5.2)
Alkaline Phosphatase: 64 U/L (ref 39–117)
BILIRUBIN DIRECT: 0.1 mg/dL (ref 0.0–0.3)
TOTAL PROTEIN: 7.2 g/dL (ref 6.0–8.3)
Total Bilirubin: 0.5 mg/dL (ref 0.2–1.2)

## 2015-01-25 MED ORDER — CYANOCOBALAMIN 1000 MCG/ML IJ SOLN
1000.0000 ug | Freq: Once | INTRAMUSCULAR | Status: AC
  Administered 2015-01-25: 1000 ug via INTRAMUSCULAR

## 2015-02-01 ENCOUNTER — Ambulatory Visit (INDEPENDENT_AMBULATORY_CARE_PROVIDER_SITE_OTHER): Payer: BLUE CROSS/BLUE SHIELD | Admitting: Family Medicine

## 2015-02-01 ENCOUNTER — Encounter: Payer: Self-pay | Admitting: Family Medicine

## 2015-02-01 ENCOUNTER — Telehealth: Payer: Self-pay | Admitting: Internal Medicine

## 2015-02-01 VITALS — BP 126/92 | HR 81 | Temp 98.5°F | Ht 63.0 in

## 2015-02-01 DIAGNOSIS — R609 Edema, unspecified: Secondary | ICD-10-CM

## 2015-02-01 DIAGNOSIS — I10 Essential (primary) hypertension: Secondary | ICD-10-CM | POA: Diagnosis not present

## 2015-02-01 NOTE — Progress Notes (Signed)
Weight not measured today due to dizziness and lightheadedness.

## 2015-02-01 NOTE — Progress Notes (Signed)
HPI:  [name] is a pleasant 49 yo pt of Dr. Burnice Logan here for an acute visit for:  Elevated BP: -reports: hx of migraines and headaches and had a mild HA at PT today and when they check her BP it was in the 130s-140s/90s-low 100s -reports Hx elevated BP in the past and on several medications in the past, also on lasix intermittently for LE which she stopped recently as swelling was improved -currently reports HA resolved -has had some mild swelling the last few days in her legs -denies: HA now, fevers, malaise, CP, SOB, DOE, vision changes  ROS: See pertinent positives and negatives per HPI.  Past Medical History  Diagnosis Date  . GERD 12/30/2006  . PARESTHESIA 12/30/2006  . Multiple sclerosis   . Anemia   . Hypertension   . Headache(784.0)   . Stroke   . Color blind     blue/green    Past Surgical History  Procedure Laterality Date  . Tonsillectomy    . Anal intraepithelial neoplasia excision    . Cesarean section    . Myometomy    . Breast biopsy      Family History  Problem Relation Age of Onset  . Stroke Father   . Psychosis Sister   . Hypertension    . Diabetes    . Dementia      Social History   Social History  . Marital Status: Married    Spouse Name: [name]  . Number of Children: 0  . Years of Education: College   Occupational History  .  [place]  . 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 Smoking status: Never Smoker   . Smokeless tobacco: Never Used  . Alcohol Use: No  . Drug Use: No  . Sexual Activity: Not Asked   Other Topics Concern  . None   Social History Narrative   Pt lives at home family.   Caffeine Use: 2 sodas daily     Current outpatient prescriptions:  .  acetaminophen (TYLENOL) 160 MG/5ML solution, Take 900 mg by mouth every 6 (six) hours as needed for mild pain., Disp: , Rfl:  .  cyanocobalamin (,VITAMIN B-12,) 1000 MCG/ML injection, Inject 1 mL (1,000 mcg total) into  the muscle every 30 (thirty) days., Disp: 10 mL, Rfl: 1 .  famotidine (PEPCID) 20 MG tablet, Take 20 mg by mouth 2 (two) times daily. OTC, Disp: , Rfl:  .  furosemide (LASIX) 40 MG tablet, Take 1 tablet (40 mg total) by mouth daily., Disp: 30 tablet, Rfl: 3 .  hydrOXYzine (ATARAX/VISTARIL) 25 MG tablet, Take 1 tablet (25 mg total) by mouth 3 (three) times daily as needed., Disp: 60 tablet, Rfl: 3 .  levETIRAcetam (KEPPRA) 100 MG/ML solution, Take 5 mLs (500 mg total) by mouth 2 (two) times daily., Disp: 300 mL, Rfl: 0 .  Teriflunomide 7 MG TABS, Take by mouth., Disp: , Rfl:   EXAM:  Filed Vitals:   02/01/15 1444  BP: 126/92  Pulse: 81  Temp: 98.5 F (36.9 C)    There is no weight on file to calculate BMI.  GENERAL: vitals reviewed and listed above, alert, oriented, appears well hydrated and in no acute distress  HEENT: atraumatic, conjunttiva clear, no obvious abnormalities on inspection of external nose and ears  NECK: no obvious masses on inspection  LUNGS: clear to auscultation bilaterally, no wheezes, rales or rhonchi, good air movement  CV: HRRR, tr peripheral  edema  MS: moves all extremities without noticeable abnormality  PSYCH: pleasant and cooperative, no obvious depression or anxiety  ASSESSMENT AND PLAN:  Discussed the following assessment and plan:  Essential hypertension  Edema  -BP borderline here -discussed restarting BP meds versus monitoring -she has had mild recurrence of her chronic intermittent LE edema since stopping her lasix so opted to restart and have her follow up with PCP to recheck BP -Patient advised to return or notify a doctor immediately if symptoms worsen or persist or new concerns arise.  Patient Instructions  BEFORE YOU LEAVE: -schedule follow up with your doctor in 1-2 weeks  Take the lasix daily for 3 days, then 1/2 dose daily or every other day as needed for swelling  Follow up with your Dr. Raliegh Ip to check BP and see if you need to  restart medication for this     Colin Benton R.

## 2015-02-01 NOTE — Progress Notes (Signed)
Pre visit review using our clinic review tool, if applicable. No additional management support is needed unless otherwise documented below in the visit note. 

## 2015-02-01 NOTE — Patient Instructions (Signed)
BEFORE YOU LEAVE: -schedule follow up with your doctor in 1-2 weeks  Take the lasix daily for 3 days, then 1/2 dose daily or every other day as needed for swelling  Follow up with your Dr. Raliegh Ip to check BP and see if you need to restart medication for this

## 2015-02-01 NOTE — Telephone Encounter (Signed)
FYI Pt being seen by Dr. Maudie Mercury now.

## 2015-02-01 NOTE — Telephone Encounter (Signed)
[facility] [place] [facility]  Patient Name: [name]  DOB: [birth year]    Initial Comment caller states wife's BP is 138/100 and she has a headache, dizziness and nausea   Nurse Assessment  Nurse: Justine Null, RN, Rodena Piety Date/Time (Eastern Time): 02/01/2015 2:36:22 PM  Confirm and document reason for call. If symptomatic, describe symptoms. ---caller states wife's BP is 138/100 and she has a headache, dizziness and nausea that started about 45 minutes ago and has been doing the PT  Has the patient traveled out of the country within the last 30 days? ---Not Applicable  Does the patient require triage? ---Declined Triage  Please document clinical information provided and list any resource used. ---when triager reached the caller she stated that she was at the MD office at present and was being seen     Guidelines    Guideline Title Affirmed Question Affirmed Notes       Final Disposition User   Clinical Call Justine Null, RN, Rodena Piety

## 2015-02-06 ENCOUNTER — Encounter: Payer: Self-pay | Admitting: Internal Medicine

## 2015-02-06 ENCOUNTER — Ambulatory Visit (INDEPENDENT_AMBULATORY_CARE_PROVIDER_SITE_OTHER): Payer: BLUE CROSS/BLUE SHIELD | Admitting: Internal Medicine

## 2015-02-06 ENCOUNTER — Ambulatory Visit: Payer: BLUE CROSS/BLUE SHIELD | Admitting: *Deleted

## 2015-02-06 VITALS — BP 122/80 | HR 87 | Temp 98.9°F | Resp 20 | Ht 63.0 in | Wt 214.0 lb

## 2015-02-06 DIAGNOSIS — R600 Localized edema: Secondary | ICD-10-CM

## 2015-02-06 NOTE — Progress Notes (Signed)
Subjective:    Patient ID: [name], female    DOB: 1966-02-11, 49 y.o.   MRN: [id number]  HPI  49 year old patient who is seen today in follow-up.  She was seen last week in physical therapy with a slightly elevated blood pressure reading and referred for office evaluation.  She had developed worsening lower extremity edema, and furosemide was resumed.  This has  improved.  Today she feels well.  She does have a home blood pressure monitor.  Past Medical History  Diagnosis Date  . GERD 12/30/2006  . PARESTHESIA 12/30/2006  . Multiple sclerosis   . Anemia   . Hypertension   . Headache(784.0)   . Stroke   . Color blind     blue/green    Social History   Social History  . Marital Status: Married    Spouse Name: [name]  . Number of Children: 0  . Years of Education: College   Occupational History  .  [place]  . 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 Smoking status: Never Smoker   . Smokeless tobacco: Never Used  . Alcohol Use: No  . Drug Use: No  . Sexual Activity: Not on file   Other Topics Concern  . Not on file   Social History Narrative   Pt lives at home family.   Caffeine Use: 2 sodas daily    Past Surgical History  Procedure Laterality Date  . Tonsillectomy    . Anal intraepithelial neoplasia excision    . Cesarean section    . Myometomy    . Breast biopsy      Family History  Problem Relation Age of Onset  . Stroke Father   . Psychosis Sister   . Hypertension    . Diabetes    . Dementia      Allergies  Allergen Reactions  . Banana   . Penicillins   . Sulfacetamide Sodium     Current Outpatient Prescriptions on File Prior to Visit  Medication Sig Dispense Refill  . acetaminophen (TYLENOL) 160 MG/5ML solution Take 900 mg by mouth every 6 (six) hours as needed for mild pain.    . cyanocobalamin (,VITAMIN B-12,) 1000 MCG/ML injection Inject 1 mL (1,000 mcg total) into the muscle every 30  (thirty) days. 10 mL 1  . famotidine (PEPCID) 20 MG tablet Take 20 mg by mouth 2 (two) times daily. OTC    . furosemide (LASIX) 40 MG tablet Take 1 tablet (40 mg total) by mouth daily. 30 tablet 3  . hydrOXYzine (ATARAX/VISTARIL) 25 MG tablet Take 1 tablet (25 mg total) by mouth 3 (three) times daily as needed. 60 tablet 3  . levETIRAcetam (KEPPRA) 100 MG/ML solution Take 5 mLs (500 mg total) by mouth 2 (two) times daily. 300 mL 0  . Teriflunomide 7 MG TABS Take by mouth.     No current facility-administered medications on file prior to visit.    BP 122/80 mmHg  Pulse 87  Temp(Src) 98.9 F (37.2 C) (Oral)  Resp 20  Ht 5\' 3"  (1.6 m)  Wt 214 lb (97.07 kg)  BMI 37.92 kg/m2  SpO2 97%     Review of Systems  Constitutional: Negative.   HENT: Negative for congestion, dental problem, hearing loss, rhinorrhea, sinus pressure, sore throat and tinnitus.   Eyes: Negative for pain, discharge and visual disturbance.  Respiratory: Negative for cough and shortness of breath.   Cardiovascular: Positive for leg  swelling. Negative for chest pain and palpitations.  Gastrointestinal: Negative for nausea, vomiting, abdominal pain, diarrhea, constipation, blood in stool and abdominal distention.  Genitourinary: Negative for dysuria, urgency, frequency, hematuria, flank pain, vaginal bleeding, vaginal discharge, difficulty urinating, vaginal pain and pelvic pain.  Musculoskeletal: Negative for joint swelling, arthralgias and gait problem.  Skin: Negative for rash.  Neurological: Negative for dizziness, syncope, speech difficulty, weakness, numbness and headaches.  Hematological: Negative for adenopathy.  Psychiatric/Behavioral: Negative for behavioral problems, dysphoric mood and agitation. The patient is not nervous/anxious.        Objective:   Physical Exam  Constitutional: She is oriented to person, place, and time. She appears well-developed and well-nourished.  Blood pressure 126/80  HENT:    Head: Normocephalic.  Right Ear: External ear normal.  Left Ear: External ear normal.  Mouth/Throat: Oropharynx is clear and moist.  Eyes: Conjunctivae and EOM are normal. Pupils are equal, round, and reactive to light.  Neck: Normal range of motion. Neck supple. No thyromegaly present.  Cardiovascular: Normal rate, regular rhythm, normal heart sounds and intact distal pulses.   Pulmonary/Chest: Effort normal and breath sounds normal.  Abdominal: Soft. Bowel sounds are normal. She exhibits no mass. There is no tenderness.  Musculoskeletal: Normal range of motion. She exhibits edema.  Trace ankle and pedal edema  Lymphadenopathy:    She has no cervical adenopathy.  Neurological: She is alert and oriented to person, place, and time.  Skin: Skin is warm and dry. No rash noted.  Psychiatric: She has a normal mood and affect. Her behavior is normal.          Assessment & Plan:   Lower extremity edema.  Low-salt diet to stressed patient instructions concerning low sodium diet.  Plan dispensed.  We'll continue to monitor home blood pressure readings.  Will notify if blood pressures consistently greater than 140 over 90 Normotensive today Obesity

## 2015-02-06 NOTE — Patient Instructions (Addendum)
Limit your sodium (Salt) intake  Low-Sodium Eating Plan Sodium raises blood pressure and causes water to be held in the body. Getting less sodium from food will help lower your blood pressure, reduce any swelling, and protect your heart, liver, and kidneys. We get sodium by adding salt (sodium chloride) to food. Most of our sodium comes from canned, boxed, and frozen foods. Restaurant foods, fast foods, and pizza are also very high in sodium. Even if you take medicine to lower your blood pressure or to reduce fluid in your body, getting less sodium from your food is important. WHAT IS MY PLAN? Most people should limit their sodium intake to 2,300 mg a day. Your health care provider recommends that you limit your sodium intake to __________ a day.  WHAT DO I NEED TO KNOW ABOUT THIS EATING PLAN? For the low-sodium eating plan, you will follow these general guidelines:  Choose foods with a % Daily Value for sodium of less than 5% (as listed on the food label).   Use salt-free seasonings or herbs instead of table salt or sea salt.   Check with your health care provider or pharmacist before using salt substitutes.   Eat fresh foods.  Eat more vegetables and fruits.  Limit canned vegetables. If you do use them, rinse them well to decrease the sodium.   Limit cheese to 1 oz (28 g) per day.   Eat lower-sodium products, often labeled as "lower sodium" or "no salt added."  Avoid foods that contain monosodium glutamate (MSG). MSG is sometimes added to [place] food and some canned foods.  Check food labels (Nutrition Facts labels) on foods to learn how much sodium is in one serving.  Eat more home-cooked food and less restaurant, buffet, and fast food.  When eating at a restaurant, ask that your food be prepared with less salt or none, if possible.  HOW DO I READ FOOD LABELS FOR SODIUM INFORMATION? The Nutrition Facts label lists the amount of sodium in one serving of the food. If you  eat more than one serving, you must multiply the listed amount of sodium by the number of servings. Food labels may also identify foods as:  Sodium free--Less than 5 mg in a serving.  Very low sodium--35 mg or less in a serving.  Low sodium--140 mg or less in a serving.  Light in sodium--50% less sodium in a serving. For example, if a food that usually has 300 mg of sodium is changed to become light in sodium, it will have 150 mg of sodium.  Reduced sodium--25% less sodium in a serving. For example, if a food that usually has 400 mg of sodium is changed to reduced sodium, it will have 300 mg of sodium. WHAT FOODS CAN I EAT? Grains Low-sodium cereals, including oats, puffed wheat and rice, and shredded wheat cereals. Low-sodium crackers. Unsalted rice and pasta. Lower-sodium bread.  Vegetables Frozen or fresh vegetables. Low-sodium or reduced-sodium canned vegetables. Low-sodium or reduced-sodium tomato sauce and paste. Low-sodium or reduced-sodium tomato and vegetable juices.  Fruits Fresh, frozen, and canned fruit. Fruit juice.  Meat and Other Protein Products Low-sodium canned tuna and salmon. Fresh or frozen meat, poultry, seafood, and fish. Lamb. Unsalted nuts. Dried beans, peas, and lentils without added salt. Unsalted canned beans. Homemade soups without salt. Eggs.  Dairy Milk. Soy milk. Ricotta cheese. Low-sodium or reduced-sodium cheeses. Yogurt.  Condiments Fresh and dried herbs and spices. Salt-free seasonings. Onion and garlic powders. Low-sodium varieties of mustard and ketchup.  Lemon juice.  Fats and Oils Reduced-sodium salad dressings. Unsalted butter.  Other Unsalted popcorn and pretzels.  The items listed above may not be a complete list of recommended foods or beverages. Contact your dietitian for more options. WHAT FOODS ARE NOT RECOMMENDED? Grains Instant hot cereals. Bread stuffing, pancake, and biscuit mixes. Croutons. Seasoned rice or pasta mixes.  Noodle soup cups. Boxed or frozen macaroni and cheese. Self-rising flour. Regular salted crackers. Vegetables Regular canned vegetables. Regular canned tomato sauce and paste. Regular tomato and vegetable juices. Frozen vegetables in sauces. Salted french fries. Olives. Angie Fava. Relishes. Sauerkraut. Salsa. Meat and Other Protein Products Salted, canned, smoked, spiced, or pickled meats, seafood, or fish. Bacon, ham, sausage, hot dogs, corned beef, chipped beef, and packaged luncheon meats. Salt pork. Jerky. Pickled herring. Anchovies, regular canned tuna, and sardines. Salted nuts. Dairy Processed cheese and cheese spreads. Cheese curds. Blue cheese and cottage cheese. Buttermilk.  Condiments Onion and garlic salt, seasoned salt, table salt, and sea salt. Canned and packaged gravies. Worcestershire sauce. Tartar sauce. Barbecue sauce. Teriyaki sauce. Soy sauce, including reduced sodium. Steak sauce. Fish sauce. Oyster sauce. Cocktail sauce. Horseradish. Regular ketchup and mustard. Meat flavorings and tenderizers. Bouillon cubes. Hot sauce. Tabasco sauce. Marinades. Taco seasonings. Relishes. Fats and Oils Regular salad dressings. Salted butter. Margarine. Ghee. Bacon fat.  Other Potato and tortilla chips. Corn chips and puffs. Salted popcorn and pretzels. Canned or dried soups. Pizza. Frozen entrees and pot pies.  The items listed above may not be a complete list of foods and beverages to avoid. Contact your dietitian for more information. Document Released: 10/24/2001 Document Revised: 05/09/2013 Document Reviewed: 03/08/2013 [facility] Patient Information 2015 [place], [place]. This information is not intended to replace advice given to you by your health care provider. Make sure you discuss any questions you have with your health care provider.   Please check your blood pressure on a regular basis.  If it is consistently greater than 150/90, please make an office appointment.

## 2015-02-06 NOTE — Progress Notes (Signed)
Pre visit review using our clinic review tool, if applicable. No additional management support is needed unless otherwise documented below in the visit note. 

## 2015-02-25 ENCOUNTER — Other Ambulatory Visit: Payer: BLUE CROSS/BLUE SHIELD

## 2015-02-27 ENCOUNTER — Ambulatory Visit (INDEPENDENT_AMBULATORY_CARE_PROVIDER_SITE_OTHER): Payer: BLUE CROSS/BLUE SHIELD | Admitting: *Deleted

## 2015-02-27 DIAGNOSIS — E538 Deficiency of other specified B group vitamins: Secondary | ICD-10-CM

## 2015-02-27 DIAGNOSIS — G35 Multiple sclerosis: Secondary | ICD-10-CM | POA: Diagnosis not present

## 2015-02-27 LAB — HEPATIC FUNCTION PANEL
ALBUMIN: 3.9 g/dL (ref 3.5–5.2)
ALT: 9 U/L (ref 0–35)
AST: 13 U/L (ref 0–37)
Alkaline Phosphatase: 76 U/L (ref 39–117)
BILIRUBIN DIRECT: 0.1 mg/dL (ref 0.0–0.3)
Total Bilirubin: 0.4 mg/dL (ref 0.2–1.2)
Total Protein: 7.4 g/dL (ref 6.0–8.3)

## 2015-02-27 MED ORDER — CYANOCOBALAMIN 1000 MCG/ML IJ SOLN
1000.0000 ug | Freq: Once | INTRAMUSCULAR | Status: AC
  Administered 2015-02-27: 1000 ug via INTRAMUSCULAR

## 2015-02-27 NOTE — Addendum Note (Signed)
Addended by: Elmer Picker on: 02/27/2015 12:09 PM   Modules accepted: Orders

## 2015-03-14 ENCOUNTER — Ambulatory Visit
Admission: RE | Admit: 2015-03-14 | Discharge: 2015-03-14 | Disposition: A | Discharge status: Home / Self Care | Payer: BLUE CROSS/BLUE SHIELD | Source: Ambulatory Visit

## 2015-03-14 DIAGNOSIS — Z1231 Encounter for screening mammogram for malignant neoplasm of breast: Secondary | ICD-10-CM

## 2015-03-15 ENCOUNTER — Ambulatory Visit: Payer: BLUE CROSS/BLUE SHIELD

## 2015-03-29 ENCOUNTER — Ambulatory Visit (INDEPENDENT_AMBULATORY_CARE_PROVIDER_SITE_OTHER): Payer: BLUE CROSS/BLUE SHIELD | Admitting: *Deleted

## 2015-03-29 DIAGNOSIS — Z23 Encounter for immunization: Secondary | ICD-10-CM

## 2015-03-29 DIAGNOSIS — E538 Deficiency of other specified B group vitamins: Secondary | ICD-10-CM

## 2015-03-29 MED ORDER — CYANOCOBALAMIN 1000 MCG/ML IJ SOLN
1000.0000 ug | Freq: Once | INTRAMUSCULAR | Status: AC
  Administered 2015-03-29: 1000 ug via INTRAMUSCULAR

## 2015-04-17 ENCOUNTER — Other Ambulatory Visit: Payer: Self-pay | Admitting: Obstetrics & Gynecology

## 2015-04-18 LAB — CYTOLOGY - PAP

## 2015-04-25 ENCOUNTER — Telehealth: Payer: Self-pay | Admitting: Internal Medicine

## 2015-04-25 NOTE — Telephone Encounter (Signed)
Ms. Melott called asking if we received bloodwork labs from Dr. Pearletha Forge (her MS doctor). They told her they sent it on 12/6 and 12/7.  *Note: Malachy Mood in [place] said it just came through on the fax and she'll give it to Dr. Raliegh Ip on tomorrow when he returns to the office.   Pt's ph# [phone] Thank you.

## 2015-04-26 ENCOUNTER — Other Ambulatory Visit: Payer: Self-pay | Admitting: *Deleted

## 2015-04-29 ENCOUNTER — Other Ambulatory Visit (INDEPENDENT_AMBULATORY_CARE_PROVIDER_SITE_OTHER): Payer: BLUE CROSS/BLUE SHIELD

## 2015-04-29 ENCOUNTER — Ambulatory Visit (INDEPENDENT_AMBULATORY_CARE_PROVIDER_SITE_OTHER): Payer: BLUE CROSS/BLUE SHIELD

## 2015-04-29 DIAGNOSIS — E538 Deficiency of other specified B group vitamins: Secondary | ICD-10-CM

## 2015-04-29 DIAGNOSIS — G35 Multiple sclerosis: Secondary | ICD-10-CM | POA: Diagnosis not present

## 2015-04-29 LAB — HEPATIC FUNCTION PANEL
ALBUMIN: 4.1 g/dL (ref 3.5–5.2)
ALT: 9 U/L (ref 0–35)
AST: 14 U/L (ref 0–37)
Alkaline Phosphatase: 82 U/L (ref 39–117)
Bilirubin, Direct: 0.1 mg/dL (ref 0.0–0.3)
TOTAL PROTEIN: 7.1 g/dL (ref 6.0–8.3)
Total Bilirubin: 0.5 mg/dL (ref 0.2–1.2)

## 2015-04-29 MED ORDER — CYANOCOBALAMIN 1000 MCG/ML IJ SOLN
1000.0000 ug | Freq: Once | INTRAMUSCULAR | Status: AC
  Administered 2015-04-29: 1000 ug via INTRAMUSCULAR

## 2015-05-02 ENCOUNTER — Telehealth: Payer: Self-pay | Admitting: Internal Medicine

## 2015-05-02 NOTE — Telephone Encounter (Signed)
Fax was send

## 2015-05-02 NOTE — Telephone Encounter (Signed)
Pt needs blood work fax to her MS  Doctor [facility]  fax [phone]

## 2015-05-14 ENCOUNTER — Telehealth: Payer: Self-pay | Admitting: Internal Medicine

## 2015-05-14 NOTE — Telephone Encounter (Signed)
Spoke to pt and clarified physician name and fax number. Labs faxed over. Pt aware.

## 2015-05-14 NOTE — Telephone Encounter (Signed)
[name] called saying she needs her Hepatic Function Panel results from 12.12.16 sent to her MS doctor. She said she's taking new medication that could effect her liver and she's supposed to take the second dosage this week but the MS doctor won't give her the second dosage until they receive the results from [place]. It sound like her MS doctor's name is Dr. Archie Patten and the fax# to the office is: [phone] per the pt. Please give her a phone call once the results have been sent. She said please attention the results to [name].  Pt's ph# [phone] Thank you.

## 2015-05-22 ENCOUNTER — Other Ambulatory Visit: Payer: Self-pay | Admitting: Neurology

## 2015-05-22 DIAGNOSIS — G379 Demyelinating disease of central nervous system, unspecified: Secondary | ICD-10-CM

## 2015-05-30 ENCOUNTER — Ambulatory Visit
Admission: RE | Admit: 2015-05-30 | Discharge: 2015-05-30 | Disposition: A | Discharge status: Home / Self Care | Payer: BLUE CROSS/BLUE SHIELD | Source: Ambulatory Visit | Attending: Neurology | Admitting: Neurology

## 2015-05-30 ENCOUNTER — Other Ambulatory Visit (INDEPENDENT_AMBULATORY_CARE_PROVIDER_SITE_OTHER): Payer: BLUE CROSS/BLUE SHIELD

## 2015-05-30 ENCOUNTER — Telehealth: Payer: Self-pay | Admitting: Internal Medicine

## 2015-05-30 ENCOUNTER — Ambulatory Visit (INDEPENDENT_AMBULATORY_CARE_PROVIDER_SITE_OTHER): Payer: BLUE CROSS/BLUE SHIELD | Admitting: Internal Medicine

## 2015-05-30 ENCOUNTER — Ambulatory Visit (INDEPENDENT_AMBULATORY_CARE_PROVIDER_SITE_OTHER): Payer: BLUE CROSS/BLUE SHIELD | Admitting: *Deleted

## 2015-05-30 ENCOUNTER — Encounter: Payer: Self-pay | Admitting: Internal Medicine

## 2015-05-30 VITALS — BP 170/110 | HR 77 | Temp 98.4°F | Resp 20 | Ht 63.0 in | Wt 212.0 lb

## 2015-05-30 DIAGNOSIS — E538 Deficiency of other specified B group vitamins: Secondary | ICD-10-CM | POA: Diagnosis not present

## 2015-05-30 DIAGNOSIS — K7200 Acute and subacute hepatic failure without coma: Secondary | ICD-10-CM | POA: Diagnosis not present

## 2015-05-30 DIAGNOSIS — I10 Essential (primary) hypertension: Secondary | ICD-10-CM

## 2015-05-30 DIAGNOSIS — G35 Multiple sclerosis: Secondary | ICD-10-CM | POA: Diagnosis not present

## 2015-05-30 DIAGNOSIS — G379 Demyelinating disease of central nervous system, unspecified: Secondary | ICD-10-CM

## 2015-05-30 LAB — HEPATIC FUNCTION PANEL
ALT: 8 U/L (ref 0–35)
AST: 14 U/L (ref 0–37)
Albumin: 4.2 g/dL (ref 3.5–5.2)
Alkaline Phosphatase: 85 U/L (ref 39–117)
Bilirubin, Direct: 0.1 mg/dL (ref 0.0–0.3)
TOTAL PROTEIN: 7 g/dL (ref 6.0–8.3)
Total Bilirubin: 0.5 mg/dL (ref 0.2–1.2)

## 2015-05-30 MED ORDER — CYANOCOBALAMIN 1000 MCG/ML IJ SOLN
1000.0000 ug | Freq: Once | INTRAMUSCULAR | Status: AC
  Administered 2015-05-30: 1000 ug via INTRAMUSCULAR

## 2015-05-30 MED ORDER — LISINOPRIL-HYDROCHLOROTHIAZIDE 20-25 MG PO TABS: 1.0000 | ORAL_TABLET | Freq: Every day | ORAL | Status: DC

## 2015-05-30 MED ORDER — GADOBENATE DIMEGLUMINE 529 MG/ML IV SOLN
20.0000 mL | Freq: Once | INTRAVENOUS | Status: AC | PRN
  Administered 2015-05-30: 20 mL via INTRAVENOUS

## 2015-05-30 NOTE — Telephone Encounter (Signed)
Pleas see message and advise. Pt is allergic to Sulfa.

## 2015-05-30 NOTE — Progress Notes (Signed)
Pre visit review using our clinic review tool, if applicable. No additional management support is needed unless otherwise documented below in the visit note. 

## 2015-05-30 NOTE — Telephone Encounter (Signed)
Spoke to pt, told pt Dr.K is gone for the day. Will get back to you tomorrow about new medication. Pt verbalized understanding.

## 2015-05-30 NOTE — Patient Instructions (Signed)
Limit your sodium (Salt) intake  Please check your blood pressure on a regular basis.   Return in one month for follow-up

## 2015-05-30 NOTE — Progress Notes (Signed)
Subjective:    Patient ID: [name], female    DOB: 04-17-1966, 50 y.o.   MRN: [id number]  HPI  BP Readings from Last 3 Encounters:  05/30/15 170/110  02/06/15 122/80  02/01/15 36/1   50 year old patient who is seen today for evaluation of high blood pressure.  She has remote history of gestational hypertension and was first noted have elevated blood pressure last week when she was seen at [facility] for follow-up of MS.  Blood pressure was 184/102.  At that time. She was seen today for a lab draw and because of last week's elevated blood pressure.  This was rechecked here today.  Blood pressure consistently 170 over 110 Asymptomatic  Past Medical History  Diagnosis Date  . GERD 12/30/2006  . PARESTHESIA 12/30/2006  . Multiple sclerosis ([place])   . Anemia   . Hypertension   . Headache(784.0)   . Stroke ([place])   . Color blind     blue/green    Social History   Social History  . Marital Status: Married    Spouse Name: [name]  . Number of Children: 0  . Years of Education: College   Occupational History  .  [employer]  . RESEARCH ANALYST [employer]   Social History Main Topics  . Smoking status: Never Smoker   . Smokeless tobacco: Never Used  . Alcohol Use: No  . Drug Use: No  . Sexual Activity: Not on file   Other Topics Concern  . Not on file   Social History Narrative   Pt lives at home family.   Caffeine Use: 2 sodas daily    Past Surgical History  Procedure Laterality Date  . Tonsillectomy    . Anal intraepithelial neoplasia excision    . Cesarean section    . Myometomy    . Breast biopsy      Family History  Problem Relation Age of Onset  . Stroke Father   . Psychosis Sister   . Hypertension    . Diabetes    . Dementia      Allergies  Allergen Reactions  . Banana   . Penicillins   . Sulfacetamide Sodium     Current Outpatient Prescriptions on File Prior to Visit  Medication Sig Dispense Refill  .  acetaminophen (TYLENOL) 160 MG/5ML solution Take 900 mg by mouth every 6 (six) hours as needed for mild pain.    . cyanocobalamin (,VITAMIN B-12,) 1000 MCG/ML injection Inject 1 mL (1,000 mcg total) into the muscle every 30 (thirty) days. 10 mL 1  . famotidine (PEPCID) 20 MG tablet Take 20 mg by mouth 2 (two) times daily. OTC    . furosemide (LASIX) 40 MG tablet Take 1 tablet (40 mg total) by mouth daily. 30 tablet 3  . hydrOXYzine (ATARAX/VISTARIL) 25 MG tablet Take 1 tablet (25 mg total) by mouth 3 (three) times daily as needed. 60 tablet 3  . Teriflunomide 7 MG TABS Take by mouth.     No current facility-administered medications on file prior to visit.    BP 170/110 mmHg  Pulse 77  Temp(Src) 98.4 F (36.9 C) (Oral)  Resp 20  Ht 5\' 3"  (1.6 m)  Wt 212 lb (96.163 kg)  BMI 37.56 kg/m2  SpO2 98%      Review of Systems  Constitutional: Negative.   HENT: Negative for congestion, dental problem, hearing loss, rhinorrhea, sinus pressure, sore throat and tinnitus.   Eyes: Negative for pain, discharge  and visual disturbance.  Respiratory: Negative for cough and shortness of breath.   Cardiovascular: Negative for chest pain, palpitations and leg swelling.  Gastrointestinal: Negative for nausea, vomiting, abdominal pain, diarrhea, constipation, blood in stool and abdominal distention.  Genitourinary: Negative for dysuria, urgency, frequency, hematuria, flank pain, vaginal bleeding, vaginal discharge, difficulty urinating, vaginal pain and pelvic pain.  Musculoskeletal: Negative for joint swelling, arthralgias and gait problem.  Skin: Negative for rash.  Neurological: Negative for dizziness, syncope, speech difficulty, weakness, numbness and headaches.  Hematological: Negative for adenopathy.  Psychiatric/Behavioral: Negative for behavioral problems, dysphoric mood and agitation. The patient is not nervous/anxious.        Objective:   Physical Exam  Constitutional: She is oriented to  person, place, and time. She appears well-developed and well-nourished.  Repeat blood pressure still 170 over 110  HENT:  Head: Normocephalic.  Right Ear: External ear normal.  Left Ear: External ear normal.  Mouth/Throat: Oropharynx is clear and moist.  Eyes: Conjunctivae and EOM are normal. Pupils are equal, round, and reactive to light.  Neck: Normal range of motion. Neck supple. No thyromegaly present.  Cardiovascular: Normal rate, regular rhythm, normal heart sounds and intact distal pulses.   Pulmonary/Chest: Effort normal and breath sounds normal.  Abdominal: Soft. Bowel sounds are normal. She exhibits no mass. There is no tenderness.  Musculoskeletal: Normal range of motion.  Trace left ankle edema  Lymphadenopathy:    She has no cervical adenopathy.  Neurological: She is alert and oriented to person, place, and time.  Skin: Skin is warm and dry. No rash noted.  Psychiatric: She has a normal mood and affect. Her behavior is normal.          Assessment & Plan:   Hypertension.  Low-salt diet recommended.  Home blood pressure monitoring.  Strongly encouraged We'll place on combination lisinopril hydrochlorothiazide and recheck in 4 weeks  MS follow-up [employer]

## 2015-05-30 NOTE — Telephone Encounter (Signed)
Patient called to advise that she went to the pharmacy to pick up the lisinopril-hydrochlorothiazide (PRINZIDE,ZESTORETIC) 20-25 MG tablet [id number], and she was informed that the med included sulfur. She is allergic to sulfur. She is requesting for this to be reviewed. Please contact the patient.

## 2015-05-31 MED ORDER — LOSARTAN POTASSIUM-HCTZ 50-12.5 MG PO TABS: 1.0000 | ORAL_TABLET | Freq: Every day | ORAL | Status: DC

## 2015-05-31 NOTE — Telephone Encounter (Signed)
Spoke to pt's husband [name], due to pt in the bathroom. Told him Dr. Raliegh Ip is going to start her on Losartan/HCTZ 50-12.5 mg one tablet daily and to call if any problems. Rx sent to pharmacy. [name] verbalized understanding and will let pt know.

## 2015-05-31 NOTE — Telephone Encounter (Signed)
Change prescription to generic Hyzaar 50/12.5, #90 one daily

## 2015-05-31 NOTE — Telephone Encounter (Signed)
Dr. K, please see message and advise. 

## 2015-05-31 NOTE — Telephone Encounter (Signed)
Spoke to pt, asked her what reaction does she have from Sulfa? Pt said she passed out and when she woke she was incoherent. Told her okay will send message to Dr. Raliegh Ip and will get back to you. Pt verbalized understanding.

## 2015-05-31 NOTE — Telephone Encounter (Signed)
Okay to try lisinopril.  Make sure patient did not have a serious/life-threatening reaction to sulfa

## 2015-06-10 ENCOUNTER — Telehealth: Payer: Self-pay | Admitting: Internal Medicine

## 2015-06-10 NOTE — Telephone Encounter (Signed)
Please see messages and advise. 

## 2015-06-10 NOTE — Telephone Encounter (Signed)
Spoke to pt, blood pressure is running low prior to taking medication, took medication last yesterday.Pt is afraid to take due to blood pressure already low wants to know if she should continue to take medication. Told pt I will get back to her, will send message to Dr.K. Pt verbalized understanding.

## 2015-06-10 NOTE — Telephone Encounter (Signed)
Spoke to pt, told her continue to hold blood pressure medication and continue home blood pressure monitoring. Notify office if blood pressure is consistently greater than 140 over 90. Per Dr. Raliegh Ip. Pt verbalized understanding.

## 2015-06-10 NOTE — Telephone Encounter (Signed)
Asked patient to hold blood pressure medication and continue home blood pressure monitoring Notify office if blood pressure is consistently greater than 140 over 90.  We'll consider alternative single agent, blood pressure regimen

## 2015-06-10 NOTE — Telephone Encounter (Signed)
Pt was put on bp med and now bp is running 123/83 , 105/73 and 112/78. Pt would like to know should she continue bp med.

## 2015-06-13 ENCOUNTER — Telehealth: Payer: Self-pay | Admitting: Internal Medicine

## 2015-06-13 NOTE — Telephone Encounter (Signed)
Pt call to ask if her lab results that she had done on 05/30/15  have been faxed over to Dr Guinevere Scarlet at [facility] number [phone]

## 2015-06-13 NOTE — Telephone Encounter (Signed)
Pt notified lab results from 1/12 were faxed to Dr. Guinevere Scarlet at [facility]. Pt verbalized understanding.

## 2015-07-01 ENCOUNTER — Ambulatory Visit (INDEPENDENT_AMBULATORY_CARE_PROVIDER_SITE_OTHER): Payer: BLUE CROSS/BLUE SHIELD | Admitting: Internal Medicine

## 2015-07-01 ENCOUNTER — Encounter: Payer: Self-pay | Admitting: Internal Medicine

## 2015-07-01 ENCOUNTER — Other Ambulatory Visit (INDEPENDENT_AMBULATORY_CARE_PROVIDER_SITE_OTHER): Payer: BLUE CROSS/BLUE SHIELD

## 2015-07-01 DIAGNOSIS — Z13828 Encounter for screening for other musculoskeletal disorder: Secondary | ICD-10-CM

## 2015-07-01 DIAGNOSIS — E538 Deficiency of other specified B group vitamins: Secondary | ICD-10-CM

## 2015-07-01 DIAGNOSIS — I10 Essential (primary) hypertension: Secondary | ICD-10-CM | POA: Diagnosis not present

## 2015-07-01 DIAGNOSIS — Z139 Encounter for screening, unspecified: Secondary | ICD-10-CM

## 2015-07-01 LAB — HEPATIC FUNCTION PANEL
ALBUMIN: 4.1 g/dL (ref 3.5–5.2)
ALK PHOS: 79 U/L (ref 39–117)
ALT: 9 U/L (ref 0–35)
AST: 16 U/L (ref 0–37)
Bilirubin, Direct: 0.1 mg/dL (ref 0.0–0.3)
TOTAL PROTEIN: 7.3 g/dL (ref 6.0–8.3)
Total Bilirubin: 0.5 mg/dL (ref 0.2–1.2)

## 2015-07-01 MED ORDER — CYANOCOBALAMIN 1000 MCG/ML IJ SOLN
1000.0000 ug | Freq: Once | INTRAMUSCULAR | Status: AC
  Administered 2015-07-01: 1000 ug via INTRAMUSCULAR

## 2015-07-01 NOTE — Progress Notes (Signed)
Pre visit review using our clinic review tool, if applicable. No additional management support is needed unless otherwise documented below in the visit note. 

## 2015-07-01 NOTE — Patient Instructions (Signed)
Limit your sodium (Salt) intake  Please check your blood pressure on a regular basis.  If it is consistently greater than 140/90, please make an office appointment.    It is important that you exercise regularly, at least 20 minutes 3 to 4 times per week.  If you develop chest pain or shortness of breath seek  medical attention.  Return in 6 months for follow-up   

## 2015-07-01 NOTE — Progress Notes (Signed)
Subjective:    Patient ID: [name], female    DOB: 12/26/1965, 50 y.o.   MRN: [id number]  HPI  50 year old patient who was seen 2 months ago with elevated readings.  She was placed on combination lisinopril hydrochlorothiazide due to stage II hypertension.  Due to a history of sulfa allergy.  She was reluctant to start this medication and was switched to losartan hydrochlorothiazide.  Blood pressure readings became a low-normal.  She had readings as low as 107 over 60 and the antihypertensive medications have been on hold.  She does take furosemide rarely She was told by neurology that her MS may be a source of her labile hypertension with wide fluctuations.  Today she feels well  Past Medical History  Diagnosis Date  . GERD 12/30/2006  . PARESTHESIA 12/30/2006  . Multiple sclerosis ([place])   . Anemia   . Hypertension   . Headache(784.0)   . Stroke ([place])   . Color blind     blue/green    Social History   Social History  . Marital Status: Married    Spouse Name: [name]  . Number of Children: 0  . Years of Education: College   Occupational History  .  [employer]  . 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 Smoking status: Never Smoker   . Smokeless tobacco: Never Used  . Alcohol Use: No  . Drug Use: No  . Sexual Activity: Not on file   Other Topics Concern  . Not on file   Social History Narrative   Pt lives at home family.   Caffeine Use: 2 sodas daily    Past Surgical History  Procedure Laterality Date  . Tonsillectomy    . Anal intraepithelial neoplasia excision    . Cesarean section    . Myometomy    . Breast biopsy      Family History  Problem Relation Age of Onset  . Stroke Father   . Psychosis Sister   . Hypertension    . Diabetes    . Dementia      Allergies  Allergen Reactions  . Banana   . Sulfacetamide Sodium Other (See Comments)    Syncope episode and was incoherent  . Penicillins     Current  Outpatient Prescriptions on File Prior to Visit  Medication Sig Dispense Refill  . acetaminophen (TYLENOL) 160 MG/5ML solution Take 900 mg by mouth every 6 (six) hours as needed for mild pain.    . cyanocobalamin (,VITAMIN B-12,) 1000 MCG/ML injection Inject 1 mL (1,000 mcg total) into the muscle every 30 (thirty) days. 10 mL 1  . Daclizumab 150 MG/ML SOSY Inject into the skin. Injection every 28 days    . famotidine (PEPCID) 20 MG tablet Take 20 mg by mouth 2 (two) times daily. OTC    . furosemide (LASIX) 40 MG tablet Take 1 tablet (40 mg total) by mouth daily. 30 tablet 3  . hydrOXYzine (ATARAX/VISTARIL) 25 MG tablet Take 1 tablet (25 mg total) by mouth 3 (three) times daily as needed. 60 tablet 3  . levETIRAcetam (KEPPRA) 500 MG tablet TAKE 1 TABLET (500 MG TOTAL) BY MOUTH 2 (TWO) TIMES DAILY.  11  . Teriflunomide 7 MG TABS Take by mouth.    . losartan-hydrochlorothiazide (HYZAAR) 50-12.5 MG tablet Take 1 tablet by mouth daily. (Patient not taking: Reported on 07/01/2015) 90 tablet 1   No current facility-administered medications on file prior to visit.  BP 138/90 mmHg  Pulse 86  Temp(Src) 98 F (36.7 C) (Oral)  Resp 20  Ht 5\' 3"  (1.6 m)  Wt 211 lb (95.709 kg)  BMI 37.39 kg/m2  SpO2 98%     Review of Systems  Constitutional: Negative.   HENT: Negative for congestion, dental problem, hearing loss, rhinorrhea, sinus pressure, sore throat and tinnitus.   Eyes: Negative for pain, discharge and visual disturbance.  Respiratory: Negative for cough and shortness of breath.   Cardiovascular: Negative for chest pain, palpitations and leg swelling.  Gastrointestinal: Negative for nausea, vomiting, abdominal pain, diarrhea, constipation, blood in stool and abdominal distention.  Genitourinary: Negative for dysuria, urgency, frequency, hematuria, flank pain, vaginal bleeding, vaginal discharge, difficulty urinating, vaginal pain and pelvic pain.  Musculoskeletal: Negative for joint  swelling, arthralgias and gait problem.  Skin: Negative for rash.  Neurological: Negative for dizziness, syncope, speech difficulty, weakness, numbness and headaches.  Hematological: Negative for adenopathy.  Psychiatric/Behavioral: Negative for behavioral problems, dysphoric mood and agitation. The patient is not nervous/anxious.        Objective:   Physical Exam  Constitutional: She appears well-developed and well-nourished. No distress.  Blood pressure consistently in the 138 over 90 range  Vitals reviewed.         Assessment & Plan:   Labile hypertension.  We'll continue a low-salt diet and efforts at weight loss.  We'll continue off antihypertensive medications at this time, the blood pressure readings are consistently greater than 140 over 90.  We'll start on losartan only MS.  Follow-up neurology

## 2015-07-15 ENCOUNTER — Telehealth: Payer: Self-pay | Admitting: Internal Medicine

## 2015-07-15 NOTE — Telephone Encounter (Signed)
Spoke to pt, told her labs were faxed to Dr. Vilma Meckel. Pt verbalized understanding.

## 2015-07-15 NOTE — Telephone Encounter (Signed)
Pt would like you to fax her labs to her MS dr at [facility], Dr Vilma Meckel. Pt states you have the fax number.

## 2015-07-21 ENCOUNTER — Encounter (HOSPITAL_COMMUNITY): Payer: Self-pay | Admitting: Emergency Medicine

## 2015-07-21 ENCOUNTER — Emergency Department (HOSPITAL_COMMUNITY)
Admission: EM | Admit: 2015-07-21 | Discharge: 2015-07-21 | Disposition: A | Discharge status: Home / Self Care | Payer: BLUE CROSS/BLUE SHIELD | Attending: Emergency Medicine | Admitting: Emergency Medicine

## 2015-07-21 DIAGNOSIS — D649 Anemia, unspecified: Secondary | ICD-10-CM | POA: Insufficient documentation

## 2015-07-21 DIAGNOSIS — Z880 Allergy status to penicillin: Secondary | ICD-10-CM | POA: Diagnosis not present

## 2015-07-21 DIAGNOSIS — Z79899 Other long term (current) drug therapy: Secondary | ICD-10-CM | POA: Insufficient documentation

## 2015-07-21 DIAGNOSIS — Z9114 Patient's other noncompliance with medication regimen: Secondary | ICD-10-CM

## 2015-07-21 DIAGNOSIS — Z8673 Personal history of transient ischemic attack (TIA), and cerebral infarction without residual deficits: Secondary | ICD-10-CM | POA: Diagnosis not present

## 2015-07-21 DIAGNOSIS — R531 Weakness: Secondary | ICD-10-CM | POA: Diagnosis present

## 2015-07-21 DIAGNOSIS — M62838 Other muscle spasm: Secondary | ICD-10-CM | POA: Diagnosis not present

## 2015-07-21 DIAGNOSIS — F445 Conversion disorder with seizures or convulsions: Secondary | ICD-10-CM

## 2015-07-21 DIAGNOSIS — K219 Gastro-esophageal reflux disease without esophagitis: Secondary | ICD-10-CM | POA: Diagnosis not present

## 2015-07-21 DIAGNOSIS — Z9119 Patient's noncompliance with other medical treatment and regimen: Secondary | ICD-10-CM | POA: Insufficient documentation

## 2015-07-21 DIAGNOSIS — I10 Essential (primary) hypertension: Secondary | ICD-10-CM

## 2015-07-21 DIAGNOSIS — G35 Multiple sclerosis: Secondary | ICD-10-CM | POA: Insufficient documentation

## 2015-07-21 DIAGNOSIS — R569 Unspecified convulsions: Secondary | ICD-10-CM | POA: Diagnosis not present

## 2015-07-21 LAB — CBC WITH DIFFERENTIAL/PLATELET
HEMATOCRIT: 40 % (ref 36.0–46.0)
Hemoglobin: 13.3 g/dL (ref 12.0–15.0)
MCH: 25.2 pg — AB (ref 26.0–34.0)
MCHC: 33.3 g/dL (ref 30.0–36.0)
MCV: 75.8 fL — ABNORMAL LOW (ref 78.0–100.0)
Platelets: 197 10*3/uL (ref 150–400)
RBC: 5.28 MIL/uL — ABNORMAL HIGH (ref 3.87–5.11)
RDW: 16.3 % — AB (ref 11.5–15.5)
WBC: 4.4 10*3/uL (ref 4.0–10.5)

## 2015-07-21 LAB — URINALYSIS, ROUTINE W REFLEX MICROSCOPIC
BILIRUBIN URINE: NEGATIVE
Glucose, UA: NEGATIVE mg/dL
HGB URINE DIPSTICK: NEGATIVE
Ketones, ur: 15 mg/dL — AB
Leukocytes, UA: NEGATIVE
Nitrite: NEGATIVE
PH: 7.5 (ref 5.0–8.0)
Protein, ur: NEGATIVE mg/dL
SPECIFIC GRAVITY, URINE: 1.014 (ref 1.005–1.030)

## 2015-07-21 LAB — COMPREHENSIVE METABOLIC PANEL
ALBUMIN: 4.1 g/dL (ref 3.5–5.0)
ALT: 11 U/L — ABNORMAL LOW (ref 14–54)
ANION GAP: 12 (ref 5–15)
AST: 19 U/L (ref 15–41)
Alkaline Phosphatase: 89 U/L (ref 38–126)
BILIRUBIN TOTAL: 0.7 mg/dL (ref 0.3–1.2)
BUN: 10 mg/dL (ref 6–20)
CO2: 25 mmol/L (ref 22–32)
Calcium: 9 mg/dL (ref 8.9–10.3)
Chloride: 103 mmol/L (ref 101–111)
Creatinine, Ser: 0.91 mg/dL (ref 0.44–1.00)
GFR calc non Af Amer: >60 mL/min (ref >60)
GLUCOSE: 85 mg/dL (ref 65–99)
POTASSIUM: 3.4 mmol/L — AB (ref 3.5–5.1)
SODIUM: 140 mmol/L (ref 135–145)
Total Protein: 8 g/dL (ref 6.5–8.1)

## 2015-07-21 MED ORDER — SODIUM CHLORIDE 0.9 % IV SOLN
1000.0000 mg | Freq: Once | INTRAVENOUS | Status: AC
  Administered 2015-07-21: 1000 mg via INTRAVENOUS
  Filled 2015-07-21: qty 10

## 2015-07-21 MED ORDER — ACETAMINOPHEN 500 MG PO TABS
1000.0000 mg | ORAL_TABLET | Freq: Once | ORAL | Status: DC
  Filled 2015-07-21: qty 2

## 2015-07-21 MED ORDER — POTASSIUM CHLORIDE CRYS ER 20 MEQ PO TBCR
40.0000 meq | EXTENDED_RELEASE_TABLET | Freq: Once | ORAL | Status: AC
  Administered 2015-07-21: 40 meq via ORAL
  Filled 2015-07-21: qty 2

## 2015-07-21 NOTE — ED Notes (Signed)
After Tylenol was scanned pt states, " Do I have to take those?"  Informed pt that she can decline them but that she had requested to have them for her headache.  Pt states her headache has now subsided and wants to make sure they will not be on her bill.

## 2015-07-21 NOTE — ED Notes (Addendum)
Pt here via EMS from church- pt had an episode of global muscle spasms and twitching while at church today. Pt was alert during episode, but does have hx of seizures. Pt also has MS and states this seems to her like a progression of the disease. Pt takes monthly injections for MS. Pt had another episode lasting approx 20 seconds. Pt also reports weakness to left side- states she woke up with weakness.

## 2015-07-21 NOTE — Discharge Instructions (Signed)
1. Medications: resume normal keppra dosing, usual home medications 2. Treatment: rest, drink plenty of fluids,  3. Follow Up: Please followup with your primary doctor in 1-2 days and Dr. Pearletha Forge as soon as possible for discussion of your diagnoses and further evaluation after today's visit; if you do not have a primary care doctor use the resource guide provided to find one; Please return to the ER for worsening symptoms

## 2015-07-21 NOTE — ED Notes (Signed)
Pt had one episode of muscle twitching involving her shoulders, arms and legs witnessed by this RN and Jarrett Soho, [place].Marland Kitchen Pt was able to speak during episode, but became nonverbal after. Vitals were unchanged during episode. Pt husband at bedside for comfort.

## 2015-07-21 NOTE — ED Notes (Signed)
Pt requesting Tylenol for headache.  States it feels like her usual headaches.  Jarrett Soho, [place] notified of same.

## 2015-07-21 NOTE — ED Notes (Signed)
Neurology at bedside.

## 2015-07-21 NOTE — ED Provider Notes (Signed)
CSN: [id number]     Arrival date & time 07/21/15  1121 History   First MD Initiated Contact with Patient 07/21/15 1235     Chief Complaint  Patient presents with  . Weakness  . Spasms     (Consider location/radiation/quality/duration/timing/severity/associated sxs/prior Treatment) The history is provided by the patient, medical records, a significant other and the EMS personnel. No language interpreter was used.     [name] is a 50 y.o. female  with a hx of GERD, MS (receiving Daclizumab infusions monthly - last was Feb 28th), anemia, HTN, headache, CVA presents to the Emergency Department complaining of 2 episodes of "muscle spasms" onset Approximately 3 AM. Patient's husband at bedside reports that she was "twitching" throughout the night. Patient reports a history of both seizures and MS flares. She was prescribed Keppra for her seizures however she reports that several months ago she stopped taking the [place] because she was feeling well. Since that time she's had an increase in her seizure like activity, especially in the last week or so. Patient reports she has been diagnosed with pseudoseizures as well. She reports that during these episodes her body twitches uncontrollably and she is unable to talk but that she is aware and able to hear. She reports intermittent loss of bowel or bladder with these episodes but no loss of bowel or bladder today. Patient reports she did not take any of her medications this morning as they often make her sleepy and she wanted to go to church. While sitting in church she reports that she had a very strange feeling, began blinking very fast and then began staring at the ceiling. She reports that during these episodes she usually becomes hypertensive. She reports that after the episode and RN at the church checked her blood pressure and found it to be greater than [id number] systolic.  EMS was called and patient had a second episode with EMS.  Per EMS patient  did not have a postictal period afterwards.  He reports her entire body feels "heavy and tired" but she reports the left side feels more tired than the right.   PCP: [facility] - Dr. Pearletha Forge   Past Medical History  Diagnosis Date  . GERD 12/30/2006  . PARESTHESIA 12/30/2006  . Multiple sclerosis ([place])   . Anemia   . Hypertension   . Headache(784.0)   . Stroke [facility])    Past Surgical History  Procedure Laterality Date  . Tonsillectomy    . Anal intraepithelial neoplasia excision    . Cesarean section    . Myometomy    . Breast biopsy     Family History  Problem Relation Age of Onset  . Stroke Father   . Psychosis Sister   . Hypertension    . Diabetes    . Dementia     Social History  Substance Use Topics  . Smoking status: Never Smoker   . Smokeless tobacco: Never Used  . Alcohol Use: No   OB History    No data available     Review of Systems  Constitutional: Negative for fever, diaphoresis, appetite change, fatigue and unexpected weight change.  HENT: Negative for mouth sores.   Eyes: Negative for visual disturbance.  Respiratory: Negative for cough, chest tightness, shortness of breath and wheezing.   Cardiovascular: Negative for chest pain.  Gastrointestinal: Negative for nausea, vomiting, abdominal pain, diarrhea and constipation.  Endocrine: Negative for polydipsia, polyphagia and polyuria.  Genitourinary: Negative for dysuria, urgency, frequency  and hematuria.  Musculoskeletal: Negative for back pain and neck stiffness.  Skin: Negative for rash.  Allergic/Immunologic: Negative for immunocompromised state.  Neurological: Positive for seizures and weakness. Negative for syncope, light-headedness and headaches.  Hematological: Does not bruise/bleed easily.  Psychiatric/Behavioral: Negative for sleep disturbance. The patient is not nervous/anxious.       Allergies  Banana; Sulfacetamide sodium; Penicillins; and Sulfur  Home Medications   Prior to  Admission medications   Medication Sig Start Date End Date Taking? Authorizing Provider  acetaminophen (TYLENOL) 160 MG/5ML solution Take 320 mg by mouth 2 (two) times daily as needed for mild pain. Reported on 07/21/2015   Yes Historical Provider, MD  cyanocobalamin (,VITAMIN B-12,) 1000 MCG/ML injection Inject 1 mL (1,000 mcg total) into the muscle every 30 (thirty) days. 06/06/13  Yes Marletta Lor, MD  cyclobenzaprine (FLEXERIL) 10 MG tablet Take 10 mg by mouth daily as needed for muscle spasms.   Yes Historical Provider, MD  Daclizumab 150 MG/ML SOSY Inject 150 mg into the skin every 28 (twenty-eight) days. Injection every 28 days 05/22/15  Yes Historical Provider, MD  famotidine (PEPCID) 20 MG tablet Take 20 mg by mouth daily as needed for heartburn or indigestion. OTC   Yes Historical Provider, MD  ferrous sulfate 220 (44 Fe) MG/5ML solution Take 220 mg by mouth daily as needed (for iron deficiency).   Yes Historical Provider, MD  furosemide (LASIX) 40 MG tablet Take 1 tablet (40 mg total) by mouth daily. Patient taking differently: Take 40 mg by mouth daily as needed for fluid.  07/30/14  Yes Marletta Lor, MD  hydrOXYzine (ATARAX/VISTARIL) 25 MG tablet Take 1 tablet (25 mg total) by mouth 3 (three) times daily as needed. Patient taking differently: Take 25 mg by mouth 3 (three) times daily as needed for itching.  07/24/14  Yes Marletta Lor, MD  levETIRAcetam (KEPPRA) 500 MG tablet TAKE 1 TABLET (500 MG TOTAL) BY MOUTH 2 (TWO) TIMES DAILY. 03/02/15  Yes Historical Provider, MD  loratadine (CLARITIN) 10 MG tablet Take 10 mg by mouth daily as needed for allergies.   Yes Historical Provider, MD  OVER THE COUNTER MEDICATION Inhale 2 sprays into the lungs daily as needed (for sinus congestion).   Yes Historical Provider, MD   BP 152/101 mmHg  Pulse 80  Temp(Src) 98.1 F (36.7 C) (Oral)  Resp 20  Ht 5\' 3"  (1.6 m)  Wt 95.255 kg  BMI 37.21 kg/m2  SpO2 99% Physical Exam   Constitutional: She is oriented to person, place, and time. She appears well-developed and well-nourished. No distress.  HENT:  Head: Normocephalic and atraumatic.  Mouth/Throat: Oropharynx is clear and moist.  Eyes: Conjunctivae and EOM are normal. Pupils are equal, round, and reactive to light. No scleral icterus.  No horizontal, vertical or rotational nystagmus  Neck: Normal range of motion. Neck supple.  Full active and passive ROM without pain No midline or paraspinal tenderness No nuchal rigidity or meningeal signs  Cardiovascular: Normal rate, regular rhythm, normal heart sounds and intact distal pulses.   Pulmonary/Chest: Effort normal and breath sounds normal. No respiratory distress. She has no wheezes. She has no rales.  Abdominal: Soft. Bowel sounds are normal. There is no tenderness. There is no rebound and no guarding.  Soft and nontender  Musculoskeletal: Normal range of motion.  Lymphadenopathy:    She has no cervical adenopathy.  Neurological: She is alert and oriented to person, place, and time. She has normal reflexes. No cranial  nerve deficit. She exhibits normal muscle tone. Coordination normal.  Mental Status:  Alert, oriented, thought content appropriate. Speech fluent without evidence of aphasia. Able to follow 2 step commands without difficulty.  Cranial Nerves:  II:  Peripheral visual fields grossly normal, pupils equal, round, reactive to light III,IV, VI: ptosis not present, extra-ocular motions intact bilaterally  V,VII: smile symmetric, facial light touch sensation equal VIII: hearing grossly normal bilaterally  IX,X: midline uvula rise  XI: bilateral shoulder shrug equal and strong XII: midline tongue extension  Motor:  5/5 in upper and lower extremities bilaterally including strong and equal grip strength and dorsiflexion/plantar flexion Sensory: Pinprick and light touch normal in all extremities.  Deep Tendon Reflexes: 1+ and symmetric  Cerebellar:  normal finger-to-nose with bilateral upper extremities Gait: gait testing deferred as patient reports she is too weak CV: distal pulses palpable throughout   Skin: Skin is warm and dry. No rash noted. She is not diaphoretic.  Psychiatric: She has a normal mood and affect. Her behavior is normal. Judgment and thought content normal.  Nursing note and vitals reviewed.   ED Course  Procedures (including critical care time) Labs Review Labs Reviewed  CBC WITH DIFFERENTIAL/PLATELET - Abnormal; Notable for the following:    RBC 5.28 (*)    MCV 75.8 (*)    MCH 25.2 (*)    RDW 16.3 (*)    All other components within normal limits  COMPREHENSIVE METABOLIC PANEL - Abnormal; Notable for the following:    Potassium 3.4 (*)    ALT 11 (*)    All other components within normal limits  URINALYSIS, ROUTINE W REFLEX MICROSCOPIC (NOT AT [facility]) - Abnormal; Notable for the following:    Ketones, ur 15 (*)    All other components within normal limits      EKG Interpretation   Date/Time:  Sunday July 21 2015 11:26:55 EST Ventricular Rate:  78 PR Interval:  146 QRS Duration: 82 QT Interval:  378 QTC Calculation: 430 R Axis:   20 Text Interpretation:  Sinus rhythm since last tracing no significant  change Confirmed by Eulis Foster  MD, Vira Agar [id number]) on 07/21/2015 1:38:32 PM      MDM   Final diagnoses:  Essential hypertension  Multiple sclerosis ([place])  Seizure ([place])  Noncompliance with medications   [name] presents with seizure-like activity but no loss of consciousness.  Patient reports she is too weak to walk but has normal neurologic exam in bed. After history and physical but prior to my exit from the room patient again endorses the same strange feeling that she had earlier.  She then had an episode of twitching with shoulder and leg twitching which appeared involuntary, her eyes were open but she was not responsive to verbal stimuli.  She remained nonverbal for approximately 3  minutes after the episode subsided then took a deep breath and was completely alert and oriented.  No oral trauma and no loss of bowel or bladder control.    Discussed with neurology will evaluate. No evidence of infection on her lab work. Mild hypokalemia repleted here in the emergency department. Patient loaded with Keppra. No further episodes here in the emergency department.  5:13 PM She reports she is feeling better. She ambulates without assistance here in the emergency department. She has been evaluated by neurology who agrees that episode today appears to have been pseudoseizure. Allergy recommends Depakote for pseudoseizure however patient reports that she wishes to return to taking her Keppra. Will continue  her Keppra and have her follow-up with her neurologist.  Her hypertension has resolved spontaneously.  She reports she  Wishes for discharge home. Discussed strict return precautions with patient and husband at bedside. They state understanding and are in agreement with the plan.  BP 135/98 mmHg  Pulse 81  Temp(Src) 98.1 F (36.7 C) (Oral)  Resp 16  Ht 5\' 3"  (1.6 m)  Wt 95.255 kg  BMI 37.21 kg/m2  SpO2 100%   Abigail Butts, PA-C 07/21/15 1713  Daleen Bo, MD 07/23/15 [phone]

## 2015-07-21 NOTE — Consult Note (Signed)
Requesting Physician:  Abigail Butts, PA     Reason for consultation: to evaluate seizures /  spells  HPI:                                                                                                                                         [name] is an 50 y.o. female patient  With multiple sclerosis, history of seizure-like spells in previously diagnosed with nonepileptic spells versus pseudoseizures,  On Keppra 750 twice a day.  She self discontinued this medication few days ago. She follows with 2 outpatient neurologist at [facility],  One of them for multiple strokes was currently on dacluzimab infusion .   she presented to the emergency room After having her seizure-like spell today. She had another spell while she was in the ER  witnessed by the ER provider.   Patient is actually able to describe her spells. She apparently shakes her head either sideways or up and down , with shrugging of the shoulders and slow writhing movement of her upper extremities up and down.  The spells typically last 30-40 seconds.   Per history, she had amplitude EEG done in the past with the spells captured on the EEG and was told that there was no EEG correlate.  Her husband remembers being told that her spells were pseudoseizures.    she denies any other neurological symptoms at this time.    last brain MRI was on 05/30/2015,  Showed no abnormal enhancing lesions.    Patient previously worked in [employer], currently on disability.   Past Medical History: Past Medical History  Diagnosis Date  . GERD 12/30/2006  . PARESTHESIA 12/30/2006  . Multiple sclerosis ([place])   . Anemia   . Hypertension   . Headache(784.0)   . Stroke [facility])     Past Surgical History  Procedure Laterality Date  . Tonsillectomy    . Anal intraepithelial neoplasia excision    . Cesarean section    . Myometomy    . Breast biopsy      Family History: Family History  Problem Relation Age of Onset  . Stroke  Father   . Psychosis Sister   . Hypertension    . Diabetes    . Dementia      Social History:   reports that she has never smoked. She has never used smokeless tobacco. She reports that she does not drink alcohol or use illicit drugs.  Allergies:  Allergies  Allergen Reactions  . Banana Swelling    Lips, tongue and face  . Sulfacetamide Sodium Other (See Comments)    Syncope episode and was incoherent  . Penicillins Diarrhea, Nausea And Vomiting and Other (See Comments)    Passed out, as per pt Has patient had a PCN reaction causing immediate rash, facial/tongue/throat swelling, SOB or lightheadedness with hypotension: Yes Has patient had a PCN reaction  causing severe rash involving mucus membranes or skin necrosis: No Has patient had a PCN reaction that required hospitalization No Has patient had a PCN reaction occurring within the last 10 years: No If all of the above answers are "NO", then may proceed with Cephalosporin use.   . Sulfur Diarrhea, Nausea And Vomiting and Other (See Comments)    Passed out, as per pt     Medications:                                                                                                                         Current facility-administered medications:  .  acetaminophen (TYLENOL) tablet 1,000 mg, 1,000 mg, Oral, Once, [employer], PA-C, 0 mg at 07/21/15 1619  Current outpatient prescriptions:  .  acetaminophen (TYLENOL) 160 MG/5ML solution, Take 320 mg by mouth 2 (two) times daily as needed for mild pain. Reported on 07/21/2015, Disp: , Rfl:  .  cyanocobalamin (,VITAMIN B-12,) 1000 MCG/ML injection, Inject 1 mL (1,000 mcg total) into the muscle every 30 (thirty) days., Disp: 10 mL, Rfl: 1 .  cyclobenzaprine (FLEXERIL) 10 MG tablet, Take 10 mg by mouth daily as needed for muscle spasms., Disp: , Rfl:  .  Daclizumab 150 MG/ML SOSY, Inject 150 mg into the skin every 28 (twenty-eight) days. Injection every 28 days, Disp: , Rfl:  .   famotidine (PEPCID) 20 MG tablet, Take 20 mg by mouth daily as needed for heartburn or indigestion. OTC, Disp: , Rfl:  .  ferrous sulfate 220 (44 Fe) MG/5ML solution, Take 220 mg by mouth daily as needed (for iron deficiency)., Disp: , Rfl:  .  furosemide (LASIX) 40 MG tablet, Take 1 tablet (40 mg total) by mouth daily. (Patient taking differently: Take 40 mg by mouth daily as needed for fluid. ), Disp: 30 tablet, Rfl: 3 .  hydrOXYzine (ATARAX/VISTARIL) 25 MG tablet, Take 1 tablet (25 mg total) by mouth 3 (three) times daily as needed. (Patient taking differently: Take 25 mg by mouth 3 (three) times daily as needed for itching. ), Disp: 60 tablet, Rfl: 3 .  levETIRAcetam (KEPPRA) 500 MG tablet, TAKE 1 TABLET (500 MG TOTAL) BY MOUTH 2 (TWO) TIMES DAILY., Disp: , Rfl: 11 .  loratadine (CLARITIN) 10 MG tablet, Take 10 mg by mouth daily as needed for allergies., Disp: , Rfl:  .  OVER THE COUNTER MEDICATION, Inhale 2 sprays into the lungs daily as needed (for sinus congestion)., Disp: , Rfl:    ROS:  History obtained from the patient  General ROS: negative for - chills, fatigue, fever, night sweats, weight gain or weight loss Psychological ROS: negative for - behavioral disorder, hallucinations, memory difficulties, mood swings or suicidal ideation Ophthalmic ROS: negative for - blurry vision, double vision, eye pain or loss of vision ENT ROS: negative for - epistaxis, nasal discharge, oral lesions, sore throat, tinnitus or vertigo Allergy and Immunology ROS: negative for - hives or itchy/watery eyes Hematological and Lymphatic ROS: negative for - bleeding problems, bruising or swollen lymph nodes Endocrine ROS: negative for - galactorrhea, hair pattern changes, polydipsia/polyuria or temperature intolerance Respiratory ROS: negative for - cough, hemoptysis, shortness of  breath or wheezing Cardiovascular ROS: negative for - chest pain, dyspnea on exertion, edema or irregular heartbeat Gastrointestinal ROS: negative for - abdominal pain, diarrhea, hematemesis, nausea/vomiting or stool incontinence Genito-Urinary ROS: negative for - dysuria, hematuria, incontinence or urinary frequency/urgency Musculoskeletal ROS: negative for - joint swelling or muscular weakness Neurological ROS: as noted in HPI Dermatological ROS: negative for rash and skin lesion changes  Neurologic Examination:                                                                                                      Blood pressure 148/98, pulse 73, temperature 98.1 F (36.7 C), temperature source Oral, resp. rate 17, height 5\' 3"  (1.6 m), weight 95.255 kg (210 lb), SpO2 100 %.  Evaluation of higher integrative functions including: Level of alertness: Alert,  Oriented to time, place and person Recent and remote memory - intact   Attention span and concentration  - intact   Speech: fluent, no evidence of dysarthria or aphasia noted.  Test the following cranial nerves: 2-12 grossly intact Motor examination: Normal tone, bulk, full 5/5 motor strength in all 4 extremities Examination of sensation : Normal and symmetric sensation to pinprick in all 4 extremities and on face Examination of deep tendon reflexes: 2+, normal and symmetric in all extremities, normal plantars bilaterally Test coordination: Normal finger nose testing, with no evidence of limb appendicular ataxia or abnormal involuntary movements or tremors noted.     Lab Results: Basic Metabolic Panel:  Recent Labs Lab 07/21/15 1417  NA 140  K 3.4*  CL 103  CO2 25  GLUCOSE 85  BUN 10  CREATININE 0.91  CALCIUM 9.0    Liver Function Tests:  Recent Labs Lab 07/21/15 1417  AST 19  ALT 11*  ALKPHOS 89  BILITOT 0.7  PROT 8.0  ALBUMIN 4.1   No results for input(s): LIPASE, AMYLASE in the last 168 hours. No results for  input(s): AMMONIA in the last 168 hours.  CBC:  Recent Labs Lab 07/21/15 1417  WBC 4.4  HGB 13.3  HCT 40.0  MCV 75.8*  PLT 197    Cardiac Enzymes: No results for input(s): CKTOTAL, CKMB, CKMBINDEX, TROPONINI in the last 168 hours.  Lipid Panel: No results for input(s): CHOL, TRIG, HDL, CHOLHDL, VLDL, LDLCALC in the last 168 hours.  CBG: No results for input(s): GLUCAP in the last 168 hours.  Microbiology: Results for orders  placed or performed during the hospital encounter of 06/27/14  Urine culture     Status: None   Collection Time: 06/27/14  1:03 PM  Result Value Ref Range Status   Specimen Description URINE, CLEAN CATCH  Final   Special Requests NONE  Final   Colony Count   Final    70,000 COLONIES/ML Performed at [facility]    Culture   Final    Multiple bacterial morphotypes present, none predominant. Suggest appropriate recollection if clinically indicated. Performed at [facility]    Report Status 06/28/2014 FINAL  Final     Imaging: No results found.  Assessment and plan:   [name] is an 50 y.o. female patient  With history suggestive of nonepileptic spells. One of her spells were witnessed today by the ER provider also felt that these were nonepileptic.  She was previously on Keppra 750 twice a day. Discussed about other medications including Depakote which could help  as mood stabilizing agents.  Patient would like to restart her prior dose of Keppra 750 minutes twice a day in follow-up with her regular neurologist as outpatient in further discuss about other medication such as Depakote.  No other neurological issues to address at this time.  She has not had any symptoms now to suggest an acute MS relapse.  can be discharged home and follow up with her outpatient neurologist.  discussed with ER provider,  Abigail Butts, PA .

## 2015-07-24 ENCOUNTER — Telehealth: Payer: Self-pay | Admitting: Internal Medicine

## 2015-07-24 NOTE — Telephone Encounter (Signed)
Pt had to go to the ED Sunday because she had a seizure. Her BP went to 182/106. When she left, it was 135/98 Pt was advised to follow up with PCP within 2 days. Pt just called now. Pt does not want to see anyone else. Ask if she can be seen on Friday. No appointments. Does Dr Raliegh Ip want to work in, or first available is 08/07/15 Please advise

## 2015-07-25 NOTE — Telephone Encounter (Signed)
[name], schedule her to come in tomorrow at 11:30.

## 2015-07-25 NOTE — Telephone Encounter (Addendum)
Pt has been scheduled.  °

## 2015-07-26 ENCOUNTER — Ambulatory Visit (INDEPENDENT_AMBULATORY_CARE_PROVIDER_SITE_OTHER): Payer: BLUE CROSS/BLUE SHIELD | Admitting: Internal Medicine

## 2015-07-26 ENCOUNTER — Other Ambulatory Visit (INDEPENDENT_AMBULATORY_CARE_PROVIDER_SITE_OTHER): Payer: BLUE CROSS/BLUE SHIELD

## 2015-07-26 ENCOUNTER — Encounter: Payer: Self-pay | Admitting: Internal Medicine

## 2015-07-26 VITALS — BP 128/90 | HR 83 | Temp 98.3°F | Resp 20 | Ht 63.0 in | Wt 214.0 lb

## 2015-07-26 DIAGNOSIS — G35 Multiple sclerosis: Secondary | ICD-10-CM | POA: Diagnosis not present

## 2015-07-26 DIAGNOSIS — E538 Deficiency of other specified B group vitamins: Secondary | ICD-10-CM

## 2015-07-26 DIAGNOSIS — I10 Essential (primary) hypertension: Secondary | ICD-10-CM | POA: Diagnosis not present

## 2015-07-26 DIAGNOSIS — Z13828 Encounter for screening for other musculoskeletal disorder: Secondary | ICD-10-CM

## 2015-07-26 DIAGNOSIS — Z139 Encounter for screening, unspecified: Secondary | ICD-10-CM

## 2015-07-26 LAB — HEPATIC FUNCTION PANEL
ALK PHOS: 83 U/L (ref 39–117)
ALT: 7 U/L (ref 0–35)
AST: 12 U/L (ref 0–37)
Albumin: 4.1 g/dL (ref 3.5–5.2)
BILIRUBIN DIRECT: 0.1 mg/dL (ref 0.0–0.3)
TOTAL PROTEIN: 6.9 g/dL (ref 6.0–8.3)
Total Bilirubin: 0.4 mg/dL (ref 0.2–1.2)

## 2015-07-26 MED ORDER — CYANOCOBALAMIN 1000 MCG/ML IJ SOLN
1000.0000 ug | Freq: Once | INTRAMUSCULAR | Status: AC
  Administered 2015-07-26: 1000 ug via INTRAMUSCULAR

## 2015-07-26 NOTE — Patient Instructions (Signed)
Limit your sodium (Salt) intake  Please check your blood pressure on a regular basis.  If it is consistently greater than 140/90, please make an office appointment.\ Return in 3 months for follow-up  Urology follow-up as scheduled

## 2015-07-26 NOTE — Progress Notes (Signed)
Pre visit review using our clinic review tool, if applicable. No additional management support is needed unless otherwise documented below in the visit note. 

## 2015-07-26 NOTE — Progress Notes (Signed)
Subjective:    Patient ID: [name], female    DOB: 13-May-1966, 50 y.o.   MRN: [id number]  HPI   50 year old patient has a history of MS.  She has a history of hypertension and obesity.  She is seen today following a ED visit 5 days ago when she presented with the right-sided chest pain.  There is also concern about recurrent seizures.  She now has been resumed on Keppra.  She is scheduled to see neurology at [facility] later today.   .  Over the past 5 days she has done well.  No recurrent seizure activity.  It is been no further right-sided chest pain   ED records and neurological consultation reviewed  Past Medical History  Diagnosis Date  . GERD 12/30/2006  . PARESTHESIA 12/30/2006  . Multiple sclerosis ([place])   . Anemia   . Hypertension   . Headache(784.0)   . Stroke [facility])     Social History   Social History  . Marital Status: Married    Spouse Name: [name]  . Number of Children: 0  . Years of Education: College   Occupational History  .  [employer]  . 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 Smoking status: Never Smoker   . Smokeless tobacco: Never Used  . Alcohol Use: No  . Drug Use: No  . Sexual Activity: Not on file   Other Topics Concern  . Not on file   Social History Narrative   Pt lives at home family.   Caffeine Use: 2 sodas daily    Past Surgical History  Procedure Laterality Date  . Tonsillectomy    . Anal intraepithelial neoplasia excision    . Cesarean section    . Myometomy    . Breast biopsy      Family History  Problem Relation Age of Onset  . Stroke Father   . Psychosis Sister   . Hypertension    . Diabetes    . Dementia      Allergies  Allergen Reactions  . Banana Swelling    Lips, tongue and face  . Sulfacetamide Sodium Other (See Comments)    Syncope episode and was incoherent  . Penicillins Diarrhea, Nausea And Vomiting and Other (See Comments)    Passed out, as per pt Has patient  had a PCN reaction causing immediate rash, facial/tongue/throat swelling, SOB or lightheadedness with hypotension: Yes Has patient had a PCN reaction causing severe rash involving mucus membranes or skin necrosis: No Has patient had a PCN reaction that required hospitalization No Has patient had a PCN reaction occurring within the last 10 years: No If all of the above answers are "NO", then may proceed with Cephalosporin use.   . Sulfur Diarrhea, Nausea And Vomiting and Other (See Comments)    Passed out, as per pt    Current Outpatient Prescriptions on File Prior to Visit  Medication Sig Dispense Refill  . acetaminophen (TYLENOL) 160 MG/5ML solution Take 320 mg by mouth 2 (two) times daily as needed for mild pain. Reported on 07/21/2015    . cyanocobalamin (,VITAMIN B-12,) 1000 MCG/ML injection Inject 1 mL (1,000 mcg total) into the muscle every 30 (thirty) days. 10 mL 1  . cyclobenzaprine (FLEXERIL) 10 MG tablet Take 10 mg by mouth daily as needed for muscle spasms.    . Daclizumab 150 MG/ML SOSY Inject 150 mg into the skin every 28 (twenty-eight) days. Injection every 28  days    . famotidine (PEPCID) 20 MG tablet Take 20 mg by mouth daily as needed for heartburn or indigestion. OTC    . ferrous sulfate 220 (44 Fe) MG/5ML solution Take 220 mg by mouth daily as needed (for iron deficiency).    . furosemide (LASIX) 40 MG tablet Take 1 tablet (40 mg total) by mouth daily. (Patient taking differently: Take 40 mg by mouth daily as needed for fluid. ) 30 tablet 3  . hydrOXYzine (ATARAX/VISTARIL) 25 MG tablet Take 1 tablet (25 mg total) by mouth 3 (three) times daily as needed. (Patient taking differently: Take 25 mg by mouth 3 (three) times daily as needed for itching. ) 60 tablet 3  . levETIRAcetam (KEPPRA) 500 MG tablet TAKE 1 TABLET (500 MG TOTAL) BY MOUTH 2 (TWO) TIMES DAILY.  11  . loratadine (CLARITIN) 10 MG tablet Take 10 mg by mouth daily as needed for allergies.    Marland Kitchen OVER THE COUNTER  MEDICATION Inhale 2 sprays into the lungs daily as needed (for sinus congestion).     No current facility-administered medications on file prior to visit.    BP 128/90 mmHg  Pulse 83  Temp(Src) 98.3 F (36.8 C) (Oral)  Resp 20  Ht 5\' 3"  (1.6 m)  Wt 214 lb (97.07 kg)  BMI 37.92 kg/m2  SpO2 98%      Review of Systems  Constitutional: Negative.   HENT: Negative for congestion, dental problem, hearing loss, rhinorrhea, sinus pressure, sore throat and tinnitus.   Eyes: Negative for pain, discharge and visual disturbance.  Respiratory: Negative for cough and shortness of breath.   Cardiovascular: Positive for chest pain. Negative for palpitations and leg swelling.  Gastrointestinal: Negative for nausea, vomiting, abdominal pain, diarrhea, constipation, blood in stool and abdominal distention.  Genitourinary: Negative for dysuria, urgency, frequency, hematuria, flank pain, vaginal bleeding, vaginal discharge, difficulty urinating, vaginal pain and pelvic pain.  Musculoskeletal: Negative for joint swelling, arthralgias and gait problem.  Skin: Negative for rash.  Neurological: Positive for seizures. Negative for dizziness, syncope, speech difficulty, weakness, numbness and headaches.  Hematological: Negative for adenopathy.  Psychiatric/Behavioral: Negative for behavioral problems, dysphoric mood and agitation. The patient is not nervous/anxious.        Objective:   Physical Exam  Constitutional: She is oriented to person, place, and time. She appears well-developed and well-nourished.  Clinically, looks well No distress Blood pressure 130/84 O2 saturation 98  HENT:  Head: Normocephalic.  Right Ear: External ear normal.  Left Ear: External ear normal.  Mouth/Throat: Oropharynx is clear and moist.  Eyes: Conjunctivae and EOM are normal. Pupils are equal, round, and reactive to light.  Neck: Normal range of motion. Neck supple. No thyromegaly present.  Cardiovascular: Normal  rate, regular rhythm, normal heart sounds and intact distal pulses.   Pulmonary/Chest: Effort normal and breath sounds normal. She exhibits no tenderness.  Abdominal: Soft. Bowel sounds are normal. She exhibits no mass. There is no tenderness.  Musculoskeletal: Normal range of motion.  Lymphadenopathy:    She has no cervical adenopathy.  Neurological: She is alert and oriented to person, place, and time.  Skin: Skin is warm and dry. No rash noted.  Psychiatric: She has a normal mood and affect. Her behavior is normal.          Assessment & Plan:   History chest wall pain, resolved  History of seizure disorder  (versus pseudoseizures ).  Follow-up neurology  B12 deficiency  Essential hypertension, stable.  No  change in therapy which includes diuretic therapy only

## 2015-07-29 ENCOUNTER — Other Ambulatory Visit: Payer: BLUE CROSS/BLUE SHIELD

## 2015-07-29 ENCOUNTER — Ambulatory Visit: Payer: BLUE CROSS/BLUE SHIELD | Admitting: *Deleted

## 2015-08-23 ENCOUNTER — Telehealth: Payer: Self-pay | Admitting: Internal Medicine

## 2015-08-23 NOTE — Telephone Encounter (Signed)
Pt would like you to fax her labs to her MS dr at [facility], Dr Vilma Meckel. Pt states you have the fax number.

## 2015-08-23 NOTE — Telephone Encounter (Signed)
Labs faxed to Dr. Vilma Meckel.

## 2015-08-26 ENCOUNTER — Ambulatory Visit (INDEPENDENT_AMBULATORY_CARE_PROVIDER_SITE_OTHER): Payer: BLUE CROSS/BLUE SHIELD | Admitting: *Deleted

## 2015-08-26 ENCOUNTER — Other Ambulatory Visit (INDEPENDENT_AMBULATORY_CARE_PROVIDER_SITE_OTHER): Payer: BLUE CROSS/BLUE SHIELD

## 2015-08-26 DIAGNOSIS — Z139 Encounter for screening, unspecified: Secondary | ICD-10-CM

## 2015-08-26 DIAGNOSIS — E538 Deficiency of other specified B group vitamins: Secondary | ICD-10-CM | POA: Diagnosis not present

## 2015-08-26 DIAGNOSIS — Z13828 Encounter for screening for other musculoskeletal disorder: Secondary | ICD-10-CM

## 2015-08-26 LAB — HEPATIC FUNCTION PANEL
ALT: 7 U/L (ref 0–35)
AST: 12 U/L (ref 0–37)
Albumin: 4.1 g/dL (ref 3.5–5.2)
Alkaline Phosphatase: 88 U/L (ref 39–117)
Bilirubin, Direct: 0.1 mg/dL (ref 0.0–0.3)
Total Bilirubin: 0.3 mg/dL (ref 0.2–1.2)
Total Protein: 7.3 g/dL (ref 6.0–8.3)

## 2015-08-26 LAB — VITAMIN B12: VITAMIN B 12: 390 pg/mL (ref 211–911)

## 2015-08-26 MED ORDER — CYANOCOBALAMIN 1000 MCG/ML IJ SOLN
1000.0000 ug | Freq: Once | INTRAMUSCULAR | Status: AC
  Administered 2015-08-26: 1000 ug via INTRAMUSCULAR

## 2015-09-02 ENCOUNTER — Telehealth: Payer: Self-pay | Admitting: Internal Medicine

## 2015-09-02 NOTE — Telephone Encounter (Signed)
Please check your blood pressure on a regular basis.  If it is consistently greater than 140/90, please make an office appointment. 

## 2015-09-02 NOTE — Telephone Encounter (Signed)
Please see message and advise 

## 2015-09-02 NOTE — Telephone Encounter (Signed)
Pt would like for you to fax the lab work from March and April to her MS Dr. Pearletha Forge @ [facility].  Pt has been having spikes of high Bp 154/101, 143/93, 144/100,145/95 took the Bp medicine on Fri and Sat because she had a real bad headache.  Did not take the medicine until Friday after calling the office.  Today it was normal 130/80 or 128/82 without medicine and took medicine (that you had taken her off of) so that you would not have a stroke took it on Friday and Sat.

## 2015-09-03 NOTE — Telephone Encounter (Signed)
Spoke to pt, told her Dr.K said to check your blood pressure on a regular basis. If it is consistently greater that 140/90, please make an office appointment. Pt verbalized understanding.

## 2015-09-27 ENCOUNTER — Ambulatory Visit (INDEPENDENT_AMBULATORY_CARE_PROVIDER_SITE_OTHER): Payer: BLUE CROSS/BLUE SHIELD

## 2015-09-27 ENCOUNTER — Other Ambulatory Visit (INDEPENDENT_AMBULATORY_CARE_PROVIDER_SITE_OTHER): Payer: BLUE CROSS/BLUE SHIELD

## 2015-09-27 ENCOUNTER — Ambulatory Visit: Payer: BLUE CROSS/BLUE SHIELD | Admitting: *Deleted

## 2015-09-27 ENCOUNTER — Other Ambulatory Visit: Payer: BLUE CROSS/BLUE SHIELD

## 2015-09-27 DIAGNOSIS — E538 Deficiency of other specified B group vitamins: Secondary | ICD-10-CM

## 2015-09-27 DIAGNOSIS — Z139 Encounter for screening, unspecified: Secondary | ICD-10-CM

## 2015-09-27 LAB — HEPATIC FUNCTION PANEL
ALT: 8 U/L (ref 0–35)
AST: 13 U/L (ref 0–37)
Albumin: 4 g/dL (ref 3.5–5.2)
Alkaline Phosphatase: 72 U/L (ref 39–117)
BILIRUBIN DIRECT: 0 mg/dL (ref 0.0–0.3)
BILIRUBIN TOTAL: 0.5 mg/dL (ref 0.2–1.2)
Total Protein: 7.3 g/dL (ref 6.0–8.3)

## 2015-09-27 MED ORDER — CYANOCOBALAMIN 1000 MCG/ML IJ SOLN
1000.0000 ug | Freq: Once | INTRAMUSCULAR | Status: AC
  Administered 2015-09-27: 1000 ug via INTRAMUSCULAR

## 2015-10-28 ENCOUNTER — Ambulatory Visit: Payer: BLUE CROSS/BLUE SHIELD | Admitting: Internal Medicine

## 2015-11-08 ENCOUNTER — Ambulatory Visit (INDEPENDENT_AMBULATORY_CARE_PROVIDER_SITE_OTHER): Payer: BLUE CROSS/BLUE SHIELD | Admitting: *Deleted

## 2015-11-08 ENCOUNTER — Other Ambulatory Visit (INDEPENDENT_AMBULATORY_CARE_PROVIDER_SITE_OTHER): Payer: BLUE CROSS/BLUE SHIELD

## 2015-11-08 DIAGNOSIS — E538 Deficiency of other specified B group vitamins: Secondary | ICD-10-CM | POA: Diagnosis not present

## 2015-11-08 DIAGNOSIS — Z13828 Encounter for screening for other musculoskeletal disorder: Secondary | ICD-10-CM

## 2015-11-08 DIAGNOSIS — Z139 Encounter for screening, unspecified: Secondary | ICD-10-CM

## 2015-11-08 LAB — HEPATIC FUNCTION PANEL
ALBUMIN: 4.4 g/dL (ref 3.5–5.2)
ALK PHOS: 82 U/L (ref 39–117)
ALT: 6 U/L (ref 0–35)
AST: 15 U/L (ref 0–37)
Bilirubin, Direct: 0.1 mg/dL (ref 0.0–0.3)
Total Bilirubin: 0.7 mg/dL (ref 0.2–1.2)
Total Protein: 7.6 g/dL (ref 6.0–8.3)

## 2015-11-08 MED ORDER — CYANOCOBALAMIN 1000 MCG/ML IJ SOLN
1000.0000 ug | Freq: Once | INTRAMUSCULAR | Status: AC
Start: 2015-11-08 — End: 2015-11-08
  Administered 2015-11-08: 1000 ug via INTRAMUSCULAR

## 2015-11-14 ENCOUNTER — Telehealth: Payer: Self-pay | Admitting: Internal Medicine

## 2015-11-14 NOTE — Telephone Encounter (Signed)
Pt would like you to fax her lab work from 6/23 to her ms dr Rennis Harding.  Fax: [phone]

## 2015-11-14 NOTE — Telephone Encounter (Signed)
Pt notified labs were faxed to Dr. Pearletha Forge. Pt verbalized understanding.

## 2015-12-23 ENCOUNTER — Ambulatory Visit (INDEPENDENT_AMBULATORY_CARE_PROVIDER_SITE_OTHER): Payer: BLUE CROSS/BLUE SHIELD | Admitting: Internal Medicine

## 2015-12-23 ENCOUNTER — Encounter: Payer: Self-pay | Admitting: Internal Medicine

## 2015-12-23 VITALS — BP 120/90 | HR 77 | Temp 98.1°F | Ht 63.0 in | Wt 214.0 lb

## 2015-12-23 DIAGNOSIS — G35 Multiple sclerosis: Secondary | ICD-10-CM

## 2015-12-23 DIAGNOSIS — J069 Acute upper respiratory infection, unspecified: Secondary | ICD-10-CM

## 2015-12-23 DIAGNOSIS — I10 Essential (primary) hypertension: Secondary | ICD-10-CM | POA: Diagnosis not present

## 2015-12-23 MED ORDER — FLUTICASONE PROPIONATE 50 MCG/ACT NA SUSP: 2.0000 | Freq: Every day | NASAL | 6 refills | Status: DC

## 2015-12-23 NOTE — Progress Notes (Signed)
Subjective:    Patient ID: [name], female    DOB: [birth year], 50 y.o.   MRN: [id number]  HPI 50 year old patient who has a history of essential hypertension.  She is followed by [facility] neurology for MS.  Recent evaluation revealed new enhancing lesions and she is being considered for alternative treatment.  The past 3 weeks she has had some sinus congestion, drainage and minimal nonproductive cough.  She has had some dizziness, but no fever or purulent drainage.  No focal sinus tenderness or headaches.  She feels her symptoms are improving.  Maintenance medication includes H1 and H2 blocker therapy  Past Medical History:  Diagnosis Date  . Anemia   . GERD 12/30/2006  . Headache(784.0)   . Hypertension   . Multiple sclerosis ([place])   . PARESTHESIA 12/30/2006  . Stroke [facility])      Social History   Social History  . Marital status: Married    Spouse name: [name]  . Number of children: 0  . Years of education: College   Occupational History  .  [employer]  . 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 Smoking status: Never Smoker  . Smokeless tobacco: Never Used  . Alcohol use No  . Drug use: No  . Sexual activity: Not on file   Other Topics Concern  . Not on file   Social History Narrative   Pt lives at home family.   Caffeine Use: 2 sodas daily    Past Surgical History:  Procedure Laterality Date  . ANAL INTRAEPITHELIAL NEOPLASIA EXCISION    . BREAST BIOPSY    . CESAREAN SECTION    . Myometomy    . TONSILLECTOMY      Family History  Problem Relation Age of Onset  . Stroke Father   . Psychosis Sister   . Hypertension    . Diabetes    . Dementia      Allergies  Allergen Reactions  . Banana Swelling    Lips, tongue and face  . Sulfacetamide Sodium Other (See Comments)    Syncope episode and was incoherent  . Penicillins Diarrhea, Nausea And Vomiting and Other (See Comments)    Passed out, as per pt Has patient had  a PCN reaction causing immediate rash, facial/tongue/throat swelling, SOB or lightheadedness with hypotension: Yes Has patient had a PCN reaction causing severe rash involving mucus membranes or skin necrosis: No Has patient had a PCN reaction that required hospitalization No Has patient had a PCN reaction occurring within the last 10 years: No If all of the above answers are "NO", then may proceed with Cephalosporin use.   . Sulfur Diarrhea, Nausea And Vomiting and Other (See Comments)    Passed out, as per pt    Current Outpatient Prescriptions on File Prior to Visit  Medication Sig Dispense Refill  . acetaminophen (TYLENOL) 160 MG/5ML solution Take 320 mg by mouth 2 (two) times daily as needed for mild pain. Reported on 07/21/2015    . cyanocobalamin (,VITAMIN B-12,) 1000 MCG/ML injection Inject 1 mL (1,000 mcg total) into the muscle every 30 (thirty) days. 10 mL 1  . cyclobenzaprine (FLEXERIL) 10 MG tablet Take 10 mg by mouth daily as needed for muscle spasms.    . famotidine (PEPCID) 20 MG tablet Take 20 mg by mouth daily as needed for heartburn or indigestion. OTC    . ferrous sulfate 220 (44 Fe) MG/5ML solution Take 220 mg by  mouth daily as needed (for iron deficiency).    . furosemide (LASIX) 40 MG tablet Take 1 tablet (40 mg total) by mouth daily. (Patient taking differently: Take 40 mg by mouth daily as needed for fluid. ) 30 tablet 3  . hydrOXYzine (ATARAX/VISTARIL) 25 MG tablet Take 1 tablet (25 mg total) by mouth 3 (three) times daily as needed. (Patient taking differently: Take 25 mg by mouth 3 (three) times daily as needed for itching. ) 60 tablet 3  . loratadine (CLARITIN) 10 MG tablet Take 10 mg by mouth daily as needed for allergies.    Marland Kitchen OVER THE COUNTER MEDICATION Inhale 2 sprays into the lungs daily as needed (for sinus congestion).     No current facility-administered medications on file prior to visit.     BP 120/90 (BP Location: Left Arm, Patient Position: Sitting, Cuff  Size: Large)   Pulse 77   Temp 98.1 F (36.7 C) (Oral)   Ht 5\' 3"  (1.6 m)   Wt 214 lb (97.1 kg)   SpO2 97%   BMI 37.91 kg/m      Review of Systems  HENT: Positive for congestion, postnasal drip, rhinorrhea and sinus pressure.   Respiratory: Positive for cough.   All other systems reviewed and are negative.      Objective:   Physical Exam  Constitutional: She is oriented to person, place, and time. She appears well-developed and well-nourished.  HENT:  Head: Normocephalic.  Right Ear: External ear normal.  Left Ear: External ear normal.  Mouth/Throat: Oropharynx is clear and moist.  Eyes: Conjunctivae and EOM are normal. Pupils are equal, round, and reactive to light.  Neck: Normal range of motion. Neck supple. No thyromegaly present.  Cardiovascular: Normal rate, regular rhythm, normal heart sounds and intact distal pulses.   Pulmonary/Chest: Effort normal and breath sounds normal.  Abdominal: Soft. Bowel sounds are normal. She exhibits no mass. There is no tenderness.  Musculoskeletal: Normal range of motion.  Lymphadenopathy:    She has no cervical adenopathy.  Neurological: She is alert and oriented to person, place, and time.  Skin: Skin is warm and dry. No rash noted.  Psychiatric: She has a normal mood and affect. Her behavior is normal.          Assessment & Plan:   Resolving URI.  Patient is accompanied by her husband with similar symptoms Essential hypertension.  Fair control MS follow-up [facility] neurology  Continue home blood pressure monitoring Low-salt diet Weight loss encouraged  Nyoka Cowden, MD

## 2015-12-23 NOTE — Patient Instructions (Addendum)
Use fluticasone nasal spray daily   HOME CARE INSTRUCTIONS  Drink plenty of water. Water helps thin the mucus so your sinuses can drain more easily.  Use a humidifier.  Inhale steam 3-4 times a day (for example, sit in the bathroom with the shower running).  Apply a warm, moist washcloth to your face 3-4 times a day, or as directed by your health care provider.  Use saline nasal sprays to help moisten and clean your sinuses.

## 2015-12-30 ENCOUNTER — Ambulatory Visit: Payer: BLUE CROSS/BLUE SHIELD | Admitting: Internal Medicine

## 2016-01-03 ENCOUNTER — Other Ambulatory Visit: Payer: BLUE CROSS/BLUE SHIELD

## 2016-01-03 ENCOUNTER — Ambulatory Visit (INDEPENDENT_AMBULATORY_CARE_PROVIDER_SITE_OTHER): Payer: BLUE CROSS/BLUE SHIELD | Admitting: *Deleted

## 2016-01-03 DIAGNOSIS — E538 Deficiency of other specified B group vitamins: Secondary | ICD-10-CM | POA: Diagnosis not present

## 2016-01-03 DIAGNOSIS — G35 Multiple sclerosis: Secondary | ICD-10-CM

## 2016-01-03 MED ORDER — CYANOCOBALAMIN 1000 MCG/ML IJ SOLN
1000.0000 ug | Freq: Once | INTRAMUSCULAR | Status: AC
Start: 2016-01-03 — End: 2016-01-03
  Administered 2016-01-03: 1000 ug via INTRAMUSCULAR

## 2016-01-04 LAB — URINALYSIS, MICROSCOPIC ONLY
CRYSTALS: NONE SEEN [HPF]
Casts: NONE SEEN [LPF]
Squamous Epithelial / LPF: NONE SEEN [HPF] (ref <=5)
Yeast: NONE SEEN [HPF]

## 2016-01-04 LAB — PREGNANCY, URINE: PREG TEST UR: NEGATIVE

## 2016-01-06 ENCOUNTER — Telehealth: Payer: Self-pay | Admitting: Internal Medicine

## 2016-01-06 NOTE — Telephone Encounter (Signed)
°  Janell call from [facility] Neurologist to ask if the lab results she requested for this pt has come back. She would like a call back with the results [phone]

## 2016-01-06 NOTE — Telephone Encounter (Signed)
Faxed [facility] results to Dr. Windy Fast office [phone]) Attn: Leeanne Rio

## 2016-04-15 ENCOUNTER — Other Ambulatory Visit: Payer: Self-pay | Admitting: Obstetrics & Gynecology

## 2016-04-15 DIAGNOSIS — Z1231 Encounter for screening mammogram for malignant neoplasm of breast: Secondary | ICD-10-CM

## 2016-04-16 ENCOUNTER — Ambulatory Visit (INDEPENDENT_AMBULATORY_CARE_PROVIDER_SITE_OTHER): Payer: BLUE CROSS/BLUE SHIELD | Admitting: *Deleted

## 2016-04-16 ENCOUNTER — Ambulatory Visit (INDEPENDENT_AMBULATORY_CARE_PROVIDER_SITE_OTHER): Payer: BLUE CROSS/BLUE SHIELD | Admitting: Family Medicine

## 2016-04-16 ENCOUNTER — Encounter: Payer: Self-pay | Admitting: Family Medicine

## 2016-04-16 VITALS — BP 132/80 | HR 84 | Temp 98.5°F | Wt 235.2 lb

## 2016-04-16 DIAGNOSIS — E538 Deficiency of other specified B group vitamins: Secondary | ICD-10-CM

## 2016-04-16 DIAGNOSIS — H00015 Hordeolum externum left lower eyelid: Secondary | ICD-10-CM | POA: Diagnosis not present

## 2016-04-16 MED ORDER — BACITRACIN-POLYMYXIN B 500-10000 UNIT/GM OP OINT: 1.0000 "application " | TOPICAL_OINTMENT | Freq: Two times a day (BID) | OPHTHALMIC | 0 refills | Status: DC

## 2016-04-16 MED ORDER — CYANOCOBALAMIN 1000 MCG/ML IJ SOLN
1000.0000 ug | Freq: Once | INTRAMUSCULAR | Status: AC
  Administered 2016-04-16: 1000 ug via INTRAMUSCULAR

## 2016-04-16 NOTE — Patient Instructions (Addendum)
Please use warm compresses and drops. If symptoms do not improve, worsen, or you develop new symptoms, please follow up with your provider as discussed. Also, please keep in touch with your provider treating your MS   Stye A stye is a bump on your eyelid caused by a bacterial infection. A stye can form inside the eyelid (internal stye) or outside the eyelid (external stye). An internal stye may be caused by an infected oil-producing gland inside your eyelid. An external stye may be caused by an infection at the base of your eyelash (hair follicle). Styes are very common. Anyone can get them at any age. They usually occur in just one eye, but you may have more than one in either eye. What are the causes? The infection is almost always caused by bacteria called Staphylococcus aureus. This is a common type of bacteria that lives on your skin. What increases the risk? You may be at higher risk for a stye if you have had one before. You may also be at higher risk if you have:  Diabetes.  Long-term illness.  Long-term eye redness.  A skin condition called seborrhea.  High fat levels in your blood (lipids). What are the signs or symptoms? Eyelid pain is the most common symptom of a stye. Internal styes are more painful than external styes. Other signs and symptoms may include:  Painful swelling of your eyelid.  A scratchy feeling in your eye.  Tearing and redness of your eye.  Pus draining from the stye. How is this diagnosed? Your health care provider may be able to diagnose a stye just by examining your eye. The health care provider may also check to make sure:  You do not have a fever or other signs of a more serious infection.  The infection has not spread to other parts of your eye or areas around your eye. How is this treated? Most styes will clear up in a few days without treatment. In some cases, you may need to use antibiotic drops or ointment to prevent infection. Your health  care provider may have to drain the stye surgically if your stye is:  Large.  Causing a lot of pain.  Interfering with your vision. This can be done using a thin blade or a needle. Follow these instructions at home:  Take medicines only as directed by your health care provider.  Apply a clean, warm compress to your eye for 10 minutes, 4 times a day.  Do not wear contact lenses or eye makeup until your stye has healed.  Do not try to pop or drain the stye. Contact a health care provider if:  You have chills or a fever.  Your stye does not go away after several days.  Your stye affects your vision.  Your eyeball becomes swollen, red, or painful. This information is not intended to replace advice given to you by your health care provider. Make sure you discuss any questions you have with your health care provider. Document Released: 02/11/2005 Document Revised: 12/29/2015 Document Reviewed: 08/18/2013 Elsevier Interactive Patient Education  2017 [employer].

## 2016-04-16 NOTE — Progress Notes (Signed)
Subjective:    Patient ID: [name], female    DOB: Sep 20, 1965, 50 y.o.   MRN: [id number]  HPI  [name] is a 50 year old female presents with a red, swollen area on her left lower eyelid for one day. Associated tenderness is noted. She denies fever, chills, sweats, drainage, visual changes/disturbances, pain, or conjunctivitis.  She is seeking evaluation today as she recently has been receiving treatment for an exacerbation of MS and was concerned this was related to her recent treatment. No treatments have been tried at home.    Review of Systems  Constitutional: Negative for chills, fatigue and fever.  Eyes: Negative for pain, redness, itching and visual disturbance.       Left lower eyelid redness with swelling  Respiratory: Negative for cough, shortness of breath and wheezing.   Cardiovascular: Negative for chest pain and palpitations.  Gastrointestinal: Negative for abdominal pain, diarrhea, nausea and vomiting.   Past Medical History:  Diagnosis Date  . Anemia   . GERD 12/30/2006  . Headache(784.0)   . Hypertension   . Multiple sclerosis ([place])   . PARESTHESIA 12/30/2006  . Stroke [facility])      Social History   Social History  . Marital status: Married    Spouse name: [name]  . Number of children: 0  . Years of education: College   Occupational History  .  [place]  . 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 Smoking status: Never Smoker  . Smokeless tobacco: Never Used  . Alcohol use No  . Drug use: No  . Sexual activity: Not on file   Other Topics Concern  . Not on file   Social History Narrative   Pt lives at home family.   Caffeine Use: 2 sodas daily    Past Surgical History:  Procedure Laterality Date  . ANAL INTRAEPITHELIAL NEOPLASIA EXCISION    . BREAST BIOPSY    . CESAREAN SECTION    . Myometomy    . TONSILLECTOMY      Family History  Problem Relation Age of Onset  . Stroke Father   .  Psychosis Sister   . Hypertension    . Diabetes    . Dementia      Allergies  Allergen Reactions  . Banana Swelling    Lips, tongue and face  . Sulfacetamide Sodium Other (See Comments)    Syncope episode and was incoherent  . Penicillins Diarrhea, Nausea And Vomiting and Other (See Comments)    Passed out, as per pt Has patient had a PCN reaction causing immediate rash, facial/tongue/throat swelling, SOB or lightheadedness with hypotension: Yes Has patient had a PCN reaction causing severe rash involving mucus membranes or skin necrosis: No Has patient had a PCN reaction that required hospitalization No Has patient had a PCN reaction occurring within the last 10 years: No If all of the above answers are "NO", then may proceed with Cephalosporin use.   . Sulfur Diarrhea, Nausea And Vomiting and Other (See Comments)    Passed out, as per pt    Current Outpatient Prescriptions on File Prior to Visit  Medication Sig Dispense Refill  . acetaminophen (TYLENOL) 160 MG/5ML solution Take 320 mg by mouth 2 (two) times daily as needed for mild pain. Reported on 07/21/2015    . acyclovir (ZOVIRAX) 200 MG capsule Take 200mg  (1 tablet) by mouth twice daily until physician discontinues medication. Start on first day of  Lemtrada infusion.    . Alemtuzumab (LEMTRADA IV) Inject into the vein 5 days.    . cyanocobalamin (,VITAMIN B-12,) 1000 MCG/ML injection Inject 1 mL (1,000 mcg total) into the muscle every 30 (thirty) days. 10 mL 1  . cyclobenzaprine (FLEXERIL) 10 MG tablet Take 10 mg by mouth daily as needed for muscle spasms.    . famotidine (PEPCID) 20 MG tablet Take 20 mg by mouth daily as needed for heartburn or indigestion. OTC    . ferrous sulfate 220 (44 Fe) MG/5ML solution Take 220 mg by mouth daily as needed (for iron deficiency).    . fluticasone (FLONASE) 50 MCG/ACT nasal spray Place 2 sprays into both nostrils daily. 16 g 6  . furosemide (LASIX) 40 MG tablet Take 1 tablet (40 mg total)  by mouth daily. (Patient taking differently: Take 40 mg by mouth daily as needed for fluid. ) 30 tablet 3  . hydrOXYzine (ATARAX/VISTARIL) 25 MG tablet Take 1 tablet (25 mg total) by mouth 3 (three) times daily as needed. (Patient taking differently: Take 25 mg by mouth 3 (three) times daily as needed for itching. ) 60 tablet 3  . ibuprofen (ADVIL,MOTRIN) 200 MG tablet Take 200 mg by mouth every 8 (eight) hours as needed.    . levETIRAcetam (KEPPRA) 100 MG/ML solution Take by mouth.    . loratadine (CLARITIN) 10 MG tablet Take 10 mg by mouth daily as needed for allergies.    Marland Kitchen OVER THE COUNTER MEDICATION Inhale 2 sprays into the lungs daily as needed (for sinus congestion).    . OXcarbazepine (TRILEPTAL) 150 MG tablet      No current facility-administered medications on file prior to visit.     BP 132/80 (BP Location: Left Arm, Patient Position: Sitting, Cuff Size: Normal)   Pulse 84   Temp 98.5 F (36.9 C) (Oral)   Wt 235 lb 3.2 oz (106.7 kg)   SpO2 98%   BMI 41.66 kg/m       Objective:   Physical Exam  Constitutional: She is oriented to person, place, and time. She appears well-developed and well-nourished.  Eyes: Conjunctivae and lids are normal. Pupils are equal, round, and reactive to light. Left eye exhibits hordeolum. No scleral icterus.  Neck: Neck supple.  Cardiovascular: Normal rate and regular rhythm.   Pulmonary/Chest: Effort normal and breath sounds normal. She has no wheezes. She has no rales.  Lymphadenopathy:    She has no cervical adenopathy.  Neurological: She is alert and oriented to person, place, and time.  Skin: Skin is warm and dry.       Assessment & Plan:  1. Hordeolum externum of left lower eyelid Exam and history support treatment of hordeolum. Advised patient that this should resolve within a few days.  Advised warm compresses and provided polysporin due to increased risk of infection related to history of MS treatment.   - bacitracin-polymyxin b  (POLYSPORIN) ophthalmic ointment; Place 1 application into the left eye every 12 (twelve) hours. apply to eye every 12 hours while awake  Dispense: 3.5 g; Refill: 0  Advised patient to follow up for further evaluation and treatment if she develops fever, chills, stye does not improve, worsens, or she develops pain. Patient and husband voiced understanding and agreed with plan.  Delano Metz, FNP-C

## 2016-04-16 NOTE — Progress Notes (Signed)
Pre visit review using our clinic review tool, if applicable. No additional management support is needed unless otherwise documented below in the visit note. 

## 2016-04-17 ENCOUNTER — Telehealth: Payer: Self-pay | Admitting: Internal Medicine

## 2016-04-17 NOTE — Telephone Encounter (Signed)
Patient Name: [name]  DOB: [birth year]    Initial Comment Caller has a stye on eye, was seen yesterday, she had a Rx written for ointment, needs eye drops. Caller called the Dr. office and they transferred her to [place].    Nurse Assessment  Nurse: Raphael Gibney, RN, Vanita Ingles Date/Time Eilene Ghazi Time): 04/17/2016 12:32:44 PM  Confirm and document reason for call. If symptomatic, describe symptoms. ---Caller states she was in the office yesterday for sty on her eye. She was prescribed eye ointment. She was told that eye drops would called in. The sty is not itching but her eyes are itching.  Does the patient have any new or worsening symptoms? ---No  Please document clinical information provided and list any resource used. ---Caller states she has already spoken to someone in the office and they were going to send the doctor a message regarding the eye drops.     Guidelines    Guideline Title Affirmed Question Affirmed Notes       Final Disposition User   Clinical Call [place], RN, Vanita Ingles    Comments  called primary number and left message. Will try secondary number.

## 2016-04-17 NOTE — Telephone Encounter (Signed)
Spoke to pt, told her Dr.K said to continue frequent warm compresses and antibiotic ointment.No further treatment will be necessary or helpful at this time. Pt verbalized understanding.

## 2016-04-17 NOTE — Telephone Encounter (Signed)
Continue frequent warm compresses and antibiotic ointment No further treatment will be necessary or helpful at this time

## 2016-04-17 NOTE — Telephone Encounter (Signed)
Marletta Lor, MD  to Marian Sorrow, LPN     [id number] PM  Note    Continue frequent warm compresses and antibiotic ointment No further treatment will be necessary or helpful at this time

## 2016-04-17 NOTE — Telephone Encounter (Signed)
Please see message and advise 

## 2016-04-17 NOTE — Telephone Encounter (Signed)
Pt saw julia for sty on eye now inside of pt left eye is itching. please advise [facility] [address]

## 2016-04-20 ENCOUNTER — Telehealth: Payer: Self-pay | Admitting: Internal Medicine

## 2016-04-20 NOTE — Telephone Encounter (Signed)
Pt states her eye has been continuously running and would like advice. Pt states eye is somewhat better though. Pt did not put the ointment in her eye, and was thinking Gregary Signs was to prescibe eye drops.  Pt states eye is still itching a little bit, but the "running" has concerned her.   [facility] [address]

## 2016-04-23 NOTE — Telephone Encounter (Signed)
Called the patient left a voicemail for the patient to return the call back at the office.

## 2016-04-30 ENCOUNTER — Other Ambulatory Visit: Payer: Self-pay | Admitting: Obstetrics & Gynecology

## 2016-05-01 LAB — CYTOLOGY - PAP

## 2016-05-15 ENCOUNTER — Ambulatory Visit
Admission: RE | Admit: 2016-05-15 | Discharge: 2016-05-15 | Disposition: A | Discharge status: Home / Self Care | Payer: BLUE CROSS/BLUE SHIELD | Source: Ambulatory Visit | Attending: Obstetrics & Gynecology | Admitting: Obstetrics & Gynecology

## 2016-05-15 DIAGNOSIS — Z1231 Encounter for screening mammogram for malignant neoplasm of breast: Secondary | ICD-10-CM

## 2016-05-19 ENCOUNTER — Ambulatory Visit: Payer: BLUE CROSS/BLUE SHIELD

## 2016-05-21 ENCOUNTER — Ambulatory Visit: Payer: BLUE CROSS/BLUE SHIELD

## 2016-05-22 ENCOUNTER — Ambulatory Visit (INDEPENDENT_AMBULATORY_CARE_PROVIDER_SITE_OTHER): Payer: BLUE CROSS/BLUE SHIELD

## 2016-05-22 DIAGNOSIS — E538 Deficiency of other specified B group vitamins: Secondary | ICD-10-CM | POA: Diagnosis not present

## 2016-05-22 MED ORDER — CYANOCOBALAMIN 1000 MCG/ML IJ SOLN
1000.0000 ug | Freq: Once | INTRAMUSCULAR | Status: AC
  Administered 2016-05-22: 1000 ug via INTRAMUSCULAR

## 2016-05-22 NOTE — Progress Notes (Signed)
Patient had her B12  Injection for Vit B12 Deficiency on 05/22/2016 at 11.11am. Given by Rosealee Albee CMA.

## 2016-05-25 ENCOUNTER — Ambulatory Visit: Payer: BLUE CROSS/BLUE SHIELD

## 2016-06-30 ENCOUNTER — Ambulatory Visit (INDEPENDENT_AMBULATORY_CARE_PROVIDER_SITE_OTHER): Payer: BLUE CROSS/BLUE SHIELD | Admitting: Emergency Medicine

## 2016-06-30 DIAGNOSIS — E538 Deficiency of other specified B group vitamins: Secondary | ICD-10-CM

## 2016-06-30 MED ORDER — CYANOCOBALAMIN 1000 MCG/ML IJ SOLN
1000.0000 ug | Freq: Once | INTRAMUSCULAR | Status: AC
  Administered 2016-06-30: 1000 ug via INTRAMUSCULAR

## 2016-07-01 ENCOUNTER — Ambulatory Visit: Payer: BLUE CROSS/BLUE SHIELD | Admitting: Internal Medicine

## 2016-07-16 ENCOUNTER — Encounter: Payer: Self-pay | Admitting: Internal Medicine

## 2016-07-16 ENCOUNTER — Ambulatory Visit (INDEPENDENT_AMBULATORY_CARE_PROVIDER_SITE_OTHER): Payer: BLUE CROSS/BLUE SHIELD | Admitting: Internal Medicine

## 2016-07-16 VITALS — BP 152/80 | HR 77 | Temp 98.5°F | Ht 63.0 in | Wt 242.2 lb

## 2016-07-16 DIAGNOSIS — G35 Multiple sclerosis: Secondary | ICD-10-CM

## 2016-07-16 DIAGNOSIS — I10 Essential (primary) hypertension: Secondary | ICD-10-CM

## 2016-07-16 MED ORDER — FUROSEMIDE 40 MG PO TABS: 40.0000 mg | ORAL_TABLET | Freq: Every day | ORAL | 2 refills | Status: DC | PRN

## 2016-07-16 NOTE — Progress Notes (Signed)
Pre visit review using our clinic review tool, if applicable. No additional management support is needed unless otherwise documented below in the visit note. 

## 2016-07-16 NOTE — Progress Notes (Signed)
Subjective:    Patient ID: [name], female    DOB: June 26, 1965, 51 y.o.   MRN: [id number]  HPI  51 year old patient who is seen today for her six-month follow-up.  She is followed closely at [facility] for MS.  She had a infusion reaction in .  The fall.  She has had some labile blood pressure readings. She has a history of B12 deficiency and remains on supplementation.  Past Medical History:  Diagnosis Date  . Anemia   . GERD 12/30/2006  . Headache(784.0)   . Hypertension   . Multiple sclerosis ([place])   . PARESTHESIA 12/30/2006  . Stroke [facility])      Social History   Social History  . Marital status: Married    Spouse name: [name]  . Number of children: 0  . Years of education: College   Occupational History  .  [employer]  . 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 Smoking status: Never Smoker  . Smokeless tobacco: Never Used  . Alcohol use No  . Drug use: No  . Sexual activity: Not on file   Other Topics Concern  . Not on file   Social History Narrative   Pt lives at home family.   Caffeine Use: 2 sodas daily    Past Surgical History:  Procedure Laterality Date  . ANAL INTRAEPITHELIAL NEOPLASIA EXCISION    . BREAST BIOPSY    . CESAREAN SECTION    . Myometomy    . TONSILLECTOMY      Family History  Problem Relation Age of Onset  . Stroke Father   . Psychosis Sister   . Hypertension    . Diabetes    . Dementia      Allergies  Allergen Reactions  . Banana Swelling    Lips, tongue and face  . Sulfacetamide Sodium Other (See Comments)    Syncope episode and was incoherent  . Penicillins Diarrhea, Nausea And Vomiting and Other (See Comments)    Passed out, as per pt Has patient had a PCN reaction causing immediate rash, facial/tongue/throat swelling, SOB or lightheadedness with hypotension: Yes Has patient had a PCN reaction causing severe rash involving mucus membranes or skin necrosis: No Has patient had a PCN  reaction that required hospitalization No Has patient had a PCN reaction occurring within the last 10 years: No If all of the above answers are "NO", then may proceed with Cephalosporin use.   . Sulfur Diarrhea, Nausea And Vomiting and Other (See Comments)    Passed out, as per pt    Current Outpatient Prescriptions on File Prior to Visit  Medication Sig Dispense Refill  . acetaminophen (TYLENOL) 160 MG/5ML solution Take 320 mg by mouth 2 (two) times daily as needed for mild pain. Reported on 07/21/2015    . acyclovir (ZOVIRAX) 200 MG capsule Take 200mg  (1 tablet) by mouth twice daily until physician discontinues medication. Start on first day of Lemtrada infusion.    . Alemtuzumab (LEMTRADA IV) Inject into the vein 5 days.    . bacitracin-polymyxin b (POLYSPORIN) ophthalmic ointment Place 1 application into the left eye every 12 (twelve) hours. apply to eye every 12 hours while awake 3.5 g 0  . cyanocobalamin (,VITAMIN B-12,) 1000 MCG/ML injection Inject 1 mL (1,000 mcg total) into the muscle every 30 (thirty) days. 10 mL 1  . cyclobenzaprine (FLEXERIL) 10 MG tablet Take 10 mg by mouth daily as needed for muscle spasms.    Marland Kitchen  famotidine (PEPCID) 20 MG tablet Take 20 mg by mouth daily as needed for heartburn or indigestion. OTC    . ferrous sulfate 220 (44 Fe) MG/5ML solution Take 220 mg by mouth daily as needed (for iron deficiency).    . fluticasone (FLONASE) 50 MCG/ACT nasal spray Place 2 sprays into both nostrils daily. 16 g 6  . furosemide (LASIX) 40 MG tablet Take 1 tablet (40 mg total) by mouth daily. (Patient taking differently: Take 40 mg by mouth daily as needed for fluid. ) 30 tablet 3  . hydrOXYzine (ATARAX/VISTARIL) 25 MG tablet Take 1 tablet (25 mg total) by mouth 3 (three) times daily as needed. (Patient taking differently: Take 25 mg by mouth 3 (three) times daily as needed for itching. ) 60 tablet 3  . ibuprofen (ADVIL,MOTRIN) 200 MG tablet Take 200 mg by mouth every 8 (eight)  hours as needed.    . levETIRAcetam (KEPPRA) 100 MG/ML solution Take by mouth.    . loratadine (CLARITIN) 10 MG tablet Take 10 mg by mouth daily as needed for allergies.    Marland Kitchen OVER THE COUNTER MEDICATION Inhale 2 sprays into the lungs daily as needed (for sinus congestion).    . OXcarbazepine (TRILEPTAL) 150 MG tablet      No current facility-administered medications on file prior to visit.     BP (!) 152/80 (BP Location: Left Arm, Patient Position: Sitting, Cuff Size: Large)   Pulse 77   Temp 98.5 F (36.9 C) (Oral)   Ht 5\' 3"  (1.6 m)   Wt 242 lb 3.2 oz (109.9 kg)   SpO2 97%   BMI 42.90 kg/m     Review of Systems  Constitutional: Positive for unexpected weight change.  HENT: Negative for congestion, dental problem, hearing loss, rhinorrhea, sinus pressure, sore throat and tinnitus.   Eyes: Negative for pain, discharge and visual disturbance.  Respiratory: Negative for cough and shortness of breath.   Cardiovascular: Negative for chest pain, palpitations and leg swelling.  Gastrointestinal: Negative for abdominal distention, abdominal pain, blood in stool, constipation, diarrhea, nausea and vomiting.  Genitourinary: Negative for difficulty urinating, dysuria, flank pain, frequency, hematuria, pelvic pain, urgency, vaginal bleeding, vaginal discharge and vaginal pain.  Musculoskeletal: Positive for arthralgias, back pain, myalgias and neck pain. Negative for gait problem and joint swelling.  Skin: Negative for rash.  Neurological: Negative for dizziness, syncope, speech difficulty, weakness, numbness and headaches.  Hematological: Negative for adenopathy.  Psychiatric/Behavioral: Negative for agitation, behavioral problems and dysphoric mood. The patient is not nervous/anxious.        Objective:   Physical Exam  Constitutional: She is oriented to person, place, and time. She appears well-developed and well-nourished.  Blood pressure 140/84 Weight 242  HENT:  Head:  Normocephalic.  Right Ear: External ear normal.  Left Ear: External ear normal.  Mouth/Throat: Oropharynx is clear and moist.  Eyes: Conjunctivae and EOM are normal. Pupils are equal, round, and reactive to light.  Neck: Normal range of motion. Neck supple. No thyromegaly present.  Cardiovascular: Normal rate, regular rhythm, normal heart sounds and intact distal pulses.   Pulmonary/Chest: Effort normal and breath sounds normal.  Abdominal: Soft. Bowel sounds are normal. She exhibits no mass. There is no tenderness.  Musculoskeletal: Normal range of motion.  Lymphadenopathy:    She has no cervical adenopathy.  Neurological: She is alert and oriented to person, place, and time.  Skin: Skin is warm and dry. No rash noted.  Psychiatric: She has a normal mood and  affect. Her behavior is normal.          Assessment & Plan:   Labile hypertension.  Will stress weight loss, salt restriction.  Will refill furosemide to take when necessary fluid retention MS.  Follow-up [facility]  Follow-up here 6 months or as needed  Nyoka Cowden

## 2016-07-16 NOTE — Patient Instructions (Signed)

## 2016-07-29 ENCOUNTER — Ambulatory Visit (INDEPENDENT_AMBULATORY_CARE_PROVIDER_SITE_OTHER): Payer: BLUE CROSS/BLUE SHIELD

## 2016-07-29 DIAGNOSIS — E538 Deficiency of other specified B group vitamins: Secondary | ICD-10-CM

## 2016-07-29 MED ORDER — CYANOCOBALAMIN 1000 MCG/ML IJ SOLN
1000.0000 ug | Freq: Once | INTRAMUSCULAR | Status: AC
  Administered 2016-07-29: 1000 ug via INTRAMUSCULAR

## 2016-07-29 NOTE — Progress Notes (Signed)
Patient got her B12 injection on 07/29/2016 at 11.30am for her Vit B12 deficiency. Given by Rosealee Albee CMA.

## 2016-09-03 ENCOUNTER — Ambulatory Visit (INDEPENDENT_AMBULATORY_CARE_PROVIDER_SITE_OTHER): Payer: BLUE CROSS/BLUE SHIELD

## 2016-09-03 DIAGNOSIS — E538 Deficiency of other specified B group vitamins: Secondary | ICD-10-CM | POA: Diagnosis not present

## 2016-09-03 MED ORDER — CYANOCOBALAMIN 1000 MCG/ML IJ SOLN
1000.0000 ug | Freq: Once | INTRAMUSCULAR | Status: AC
  Administered 2016-09-03: 1000 ug via INTRAMUSCULAR

## 2016-09-03 MED ORDER — CYANOCOBALAMIN 1000 MCG/ML IJ SOLN
1000.0000 ug | Freq: Once | INTRAMUSCULAR | Status: DC
Start: 2016-09-03 — End: 2017-11-11

## 2016-09-04 ENCOUNTER — Other Ambulatory Visit (INDEPENDENT_AMBULATORY_CARE_PROVIDER_SITE_OTHER): Payer: BLUE CROSS/BLUE SHIELD

## 2016-09-04 DIAGNOSIS — G35 Multiple sclerosis: Secondary | ICD-10-CM | POA: Diagnosis not present

## 2016-09-05 LAB — PREGNANCY, URINE: Preg Test, Ur: NEGATIVE

## 2016-09-09 ENCOUNTER — Telehealth: Payer: Self-pay | Admitting: Internal Medicine

## 2016-09-11 LAB — URINALYSIS, MICROSCOPIC ONLY

## 2016-09-24 ENCOUNTER — Encounter: Payer: Self-pay | Admitting: Family Medicine

## 2016-09-24 ENCOUNTER — Ambulatory Visit (INDEPENDENT_AMBULATORY_CARE_PROVIDER_SITE_OTHER): Payer: BLUE CROSS/BLUE SHIELD | Admitting: Family Medicine

## 2016-09-24 VITALS — BP 147/98 | HR 84 | Temp 98.5°F | Ht 63.0 in | Wt 233.0 lb

## 2016-09-24 DIAGNOSIS — J018 Other acute sinusitis: Secondary | ICD-10-CM

## 2016-09-24 MED ORDER — AZITHROMYCIN 250 MG PO TABS: ORAL_TABLET | ORAL | 0 refills | Status: DC

## 2016-09-24 MED ORDER — HYDROCODONE-HOMATROPINE 5-1.5 MG/5ML PO SYRP: 5.0000 mL | ORAL_SOLUTION | ORAL | 0 refills | Status: DC | PRN

## 2016-09-24 NOTE — Progress Notes (Signed)
   Subjective:    Patient ID: [name], female    DOB: 05/02/1966, 51 y.o.   MRN: [id number]  HPI Here for 3 days of sinus pressure, facial pain, PND, fever, and coughing up yellow sputum. Her husband was seen here last week with similar symptoms and he is getting better after treatment with a Zpack.    Review of Systems  Constitutional: Positive for fever.  HENT: Positive for congestion, postnasal drip, sinus pain and sinus pressure. Negative for sore throat.   Eyes: Negative.   Respiratory: Positive for cough.        Objective:   Physical Exam  Constitutional: She appears well-developed and well-nourished.  HENT:  Right Ear: External ear normal.  Left Ear: External ear normal.  Nose: Nose normal.  Mouth/Throat: Oropharynx is clear and moist.  Eyes: Conjunctivae are normal.  Neck: No thyromegaly present.  Pulmonary/Chest: Effort normal and breath sounds normal. No respiratory distress. She has no wheezes. She has no rales.  Lymphadenopathy:    She has no cervical adenopathy.          Assessment & Plan:  Sinusitis, treat with a Zpack.  Alysia Penna, MD

## 2016-09-24 NOTE — Patient Instructions (Signed)
WE NOW OFFER   [facility] FAST TRACK!!!  SAME DAY Appointments for ACUTE CARE  Such as: Sprains, Injuries, cuts, abrasions, rashes, muscle pain, joint pain, back pain Colds, flu, sore throats, headache, allergies, cough, fever  Ear pain, sinus and eye infections Abdominal pain, nausea, vomiting, diarrhea, upset stomach Animal/insect bites  3 Easy Ways to Schedule: Walk-In Scheduling Call in scheduling Mychart Sign-up: [email or web address]         

## 2016-09-29 NOTE — Telephone Encounter (Signed)
Left message on both numbers -there was a lab error with the urine sample ( [facility] did not keep it in the fridge ) Told her to come by at her convenience and give [place] another sample.

## 2016-09-29 NOTE — Telephone Encounter (Signed)
error 

## 2016-10-04 ENCOUNTER — Encounter (HOSPITAL_COMMUNITY): Payer: Self-pay

## 2016-10-04 ENCOUNTER — Emergency Department (HOSPITAL_COMMUNITY)
Admission: EM | Admit: 2016-10-04 | Discharge: 2016-10-04 | Disposition: A | Discharge status: Home / Self Care | Payer: BLUE CROSS/BLUE SHIELD | Attending: Emergency Medicine | Admitting: Emergency Medicine

## 2016-10-04 ENCOUNTER — Emergency Department (HOSPITAL_COMMUNITY): Payer: BLUE CROSS/BLUE SHIELD

## 2016-10-04 DIAGNOSIS — R569 Unspecified convulsions: Secondary | ICD-10-CM

## 2016-10-04 DIAGNOSIS — Z79899 Other long term (current) drug therapy: Secondary | ICD-10-CM | POA: Diagnosis not present

## 2016-10-04 DIAGNOSIS — Z8673 Personal history of transient ischemic attack (TIA), and cerebral infarction without residual deficits: Secondary | ICD-10-CM | POA: Diagnosis not present

## 2016-10-04 DIAGNOSIS — I10 Essential (primary) hypertension: Secondary | ICD-10-CM | POA: Diagnosis not present

## 2016-10-04 LAB — POC URINE PREG, ED: PREG TEST UR: NEGATIVE

## 2016-10-04 LAB — URINALYSIS, ROUTINE W REFLEX MICROSCOPIC
Bilirubin Urine: NEGATIVE
GLUCOSE, UA: NEGATIVE mg/dL
Ketones, ur: NEGATIVE mg/dL
LEUKOCYTES UA: NEGATIVE
NITRITE: NEGATIVE
PH: 8 (ref 5.0–8.0)
Protein, ur: NEGATIVE mg/dL
SPECIFIC GRAVITY, URINE: 1.009 (ref 1.005–1.030)
WBC, UA: NONE SEEN WBC/hpf (ref 0–5)

## 2016-10-04 LAB — CBC
HCT: 35.5 % — ABNORMAL LOW (ref 36.0–46.0)
HEMOGLOBIN: 11.4 g/dL — AB (ref 12.0–15.0)
MCH: 24.4 pg — AB (ref 26.0–34.0)
MCHC: 32.1 g/dL (ref 30.0–36.0)
MCV: 76 fL — AB (ref 78.0–100.0)
Platelets: 309 10*3/uL (ref 150–400)
RBC: 4.67 MIL/uL (ref 3.87–5.11)
RDW: 15.7 % — ABNORMAL HIGH (ref 11.5–15.5)
WBC: 5.4 10*3/uL (ref 4.0–10.5)

## 2016-10-04 LAB — COMPREHENSIVE METABOLIC PANEL
ALK PHOS: 90 U/L (ref 38–126)
ALT: 9 U/L — ABNORMAL LOW (ref 14–54)
ANION GAP: 8 (ref 5–15)
AST: 17 U/L (ref 15–41)
Albumin: 4.1 g/dL (ref 3.5–5.0)
BUN: 8 mg/dL (ref 6–20)
CALCIUM: 8.9 mg/dL (ref 8.9–10.3)
CO2: 22 mmol/L (ref 22–32)
CREATININE: 0.84 mg/dL (ref 0.44–1.00)
Chloride: 106 mmol/L (ref 101–111)
Glucose, Bld: 92 mg/dL (ref 65–99)
Potassium: 3.2 mmol/L — ABNORMAL LOW (ref 3.5–5.1)
Sodium: 136 mmol/L (ref 135–145)
TOTAL PROTEIN: 7.4 g/dL (ref 6.5–8.1)
Total Bilirubin: 0.6 mg/dL (ref 0.3–1.2)

## 2016-10-04 LAB — CBG MONITORING, ED: GLUCOSE-CAPILLARY: 87 mg/dL (ref 65–99)

## 2016-10-04 LAB — MAGNESIUM: Magnesium: 2.1 mg/dL (ref 1.7–2.4)

## 2016-10-04 MED ORDER — LORAZEPAM 2 MG/ML IJ SOLN: 2.0000 mg | Freq: Once | INTRAMUSCULAR | Status: DC

## 2016-10-04 MED ORDER — LORAZEPAM 2 MG/ML IJ SOLN
1.0000 mg | Freq: Once | INTRAMUSCULAR | Status: AC
  Administered 2016-10-04: 1 mg via INTRAVENOUS
  Filled 2016-10-04: qty 1

## 2016-10-04 MED ORDER — OXCARBAZEPINE 300 MG PO TABS
300.0000 mg | ORAL_TABLET | Freq: Two times a day (BID) | ORAL | Status: DC
  Administered 2016-10-04: 300 mg via ORAL
  Filled 2016-10-04: qty 1

## 2016-10-04 MED ORDER — SODIUM CHLORIDE 0.9 % IV SOLN
1000.0000 mg | Freq: Once | INTRAVENOUS | Status: AC
  Administered 2016-10-04: 1000 mg via INTRAVENOUS
  Filled 2016-10-04: qty 10

## 2016-10-04 NOTE — ED Notes (Signed)
Staff attempted to ambulate pt, pt stood and then sat back down stating her legs would not work. Alerted PA

## 2016-10-04 NOTE — ED Triage Notes (Signed)
To room via EMS.  Onset 9-9:30am pt has had numerous seizures this morning, incontinent of urine with each seizure, and vomiting 8-9.,  Today has had worsening of usual MS symptoms: numb from head to feet, muscle spasms, neck pain, chills, uncontrollable crying, Pt A&O x 4, describing symptoms in detal.

## 2016-10-04 NOTE — Discharge Instructions (Signed)
Please continue taking her daily medications as prescribed. Please follow-up with [facility] neurology tomorrow regarding today's visit.   Get help right away if: You have a seizure: That lasts longer than 5 minutes. That is different than previous seizures. That leaves you unable to speak or use a part of your body. That makes it harder to breathe. After a head injury. You have: Multiple seizures in a row. Confusion or a severe headache right after a seizure. You are having seizures more often. You do not wake up immediately after a seizure. You injure yourself during a seizure.

## 2016-10-04 NOTE — ED Notes (Signed)
Patient transported to X-ray 

## 2016-10-04 NOTE — ED Notes (Signed)
Pt speaking in full, clear sentences explaining how she had a seizure this am to the RN. Upon asking patient specific questions (year, name, date)\ pt would no longer respond.

## 2016-10-04 NOTE — ED Notes (Signed)
ED Provider at bedside. 

## 2016-10-04 NOTE — ED Notes (Signed)
Pt was ambulated O2 range was between 98-100, and HR was 89 throughout the walk.

## 2016-10-04 NOTE — ED Provider Notes (Signed)
[facility] DEPT Provider Note   CSN: [id number] Arrival date & time: 10/04/16  1108     History   Chief Complaint Chief Complaint  Patient presents with  . Seizures    HPI ABRA LINGENFELTER is a 50 y.o. female with PMHx of MS, stroke, HTN, brought in by EMS Presents today with complaints of multiple seizures this morning lasting 20-30 seconds each and about 10 episodes. She reports this is worsening of her usual MS symptoms are not her usual seizure which she describes is tonic-clonic. She states her worsening of MS symptoms include numbness from her head to her feet, muscle spasms, neck pain, chills. She reports urinary incontinence after episodes at home and here in ED and episodes of vomiting postictal. She denies any fevers. She denies any change in her medications stating the last change was one year ago. Patient admits to husband recently having heart surgery and recently returned home. She also admits to missing her Keppra dose this morning. She reports her neurologist is with [facility] and has had studies done and is closely followed.  Last EEG done and 2016 showed normal results but does not rule out epilepsy.  Pt's sister states she found her this morning at home post ictal with confusion, vomiting, and urinary incontinence.   The history is provided by the patient and a parent. No language interpreter was used.  Seizures   Associated symptoms include vomiting. Pertinent negatives include no chest pain and no diarrhea.    Past Medical History:  Diagnosis Date  . Anemia   . GERD 12/30/2006  . Headache(784.0)   . Hypertension   . Multiple sclerosis ([place])   . PARESTHESIA 12/30/2006  . Stroke [facility])     Patient Active Problem List   Diagnosis Date Noted  . Viral URI 12/23/2015  . Essential hypertension 05/30/2015  . Right-sided chest pain 06/27/2014  . Microcytic anemia 06/27/2014  . Multiple sclerosis exacerbation ([place]) 06/27/2014  . Obesity (BMI 30-39.9) 06/27/2014   . B12 deficiency 06/06/2013  . Multiple sclerosis ([place]) 08/12/2010  . GERD 12/30/2006  . PARESTHESIA 12/30/2006    Past Surgical History:  Procedure Laterality Date  . ANAL INTRAEPITHELIAL NEOPLASIA EXCISION    . BREAST BIOPSY    . CESAREAN SECTION    . Myometomy    . TONSILLECTOMY      OB History    No data available       Home Medications    Prior to Admission medications   Medication Sig Start Date End Date Taking? Authorizing Provider  acetaminophen (TYLENOL) 160 MG/5ML solution Take 320 mg by mouth 2 (two) times daily as needed for mild pain. Reported on 07/21/2015   Yes [provider]  acyclovir (ZOVIRAX) 200 MG capsule Take 200mg  (1 tablet) by mouth twice daily until physician discontinues medication. Start on first day of [place] infusion. 11/28/15  Yes [provider]  azithromycin (ZITHROMAX) 250 MG tablet As directed 09/24/16  Yes Laurey Morale, MD  citalopram (CELEXA) 20 MG tablet  07/09/16  Yes [provider]  cyanocobalamin (,VITAMIN B-12,) 1000 MCG/ML injection Inject 1 mL (1,000 mcg total) into the muscle every 30 (thirty) days. 06/06/13  Yes Marletta Lor, MD  cyclobenzaprine (FLEXERIL) 10 MG tablet Take 10 mg by mouth daily as needed for muscle spasms.   Yes [provider]  famotidine (PEPCID) 20 MG tablet Take 20 mg by mouth daily as needed for heartburn or indigestion. OTC   Yes [provider]  famotidine (PEPCID) 40 MG/5ML suspension Take 40 mg by mouth at bedtime.  07/09/16  Yes [provider]  ferrous sulfate 220 (44 Fe) MG/5ML solution Take 220 mg by mouth daily as needed (for iron deficiency).   Yes [provider]  fluticasone (FLONASE) 50 MCG/ACT nasal spray Place 2 sprays into both nostrils daily. 12/23/15  Yes Marletta Lor, MD  furosemide (LASIX) 40 MG tablet Take 1 tablet (40 mg total) by mouth daily as needed for fluid. 07/16/16  Yes Marletta Lor, MD    HYDROcodone-homatropine (HYDROMET) 5-1.5 MG/5ML syrup Take 5 mLs by mouth every 4 (four) hours as needed. Patient taking differently: Take 5 mLs by mouth every 4 (four) hours as needed for cough.  09/24/16  Yes Laurey Morale, MD  hydrOXYzine (ATARAX/VISTARIL) 25 MG tablet Take 1 tablet (25 mg total) by mouth 3 (three) times daily as needed. Patient taking differently: Take 25 mg by mouth 3 (three) times daily as needed for itching.  07/24/14  Yes Marletta Lor, MD  hydrOXYzine (ATARAX/VISTARIL) 25 MG tablet Take 25 mg by mouth 3 (three) times daily as needed.   Yes [provider]  ibuprofen (ADVIL,MOTRIN) 200 MG tablet Take 200 mg by mouth every 8 (eight) hours as needed.   Yes [provider]  levETIRAcetam (KEPPRA) 100 MG/ML solution Take 500 mg by mouth 2 (two) times daily.  11/28/15 11/27/16 Yes [provider]  loratadine (CLARITIN) 10 MG tablet Take 10 mg by mouth daily as needed for allergies.   Yes [provider]  OVER THE COUNTER MEDICATION Inhale 2 sprays into the lungs daily as needed (for sinus congestion).   Yes [provider]  Oxcarbazepine (TRILEPTAL) 300 MG tablet Take 300 mg by mouth 2 (two) times daily.  06/11/16  Yes [provider]  tizanidine (ZANAFLEX) 2 MG capsule Take 2 mg by mouth 2 (two) times daily as needed for muscle spasms.  07/09/16  Yes [provider]  Alemtuzumab (LEMTRADA IV) Inject into the vein 5 days. Every September ( yearly )    [provider]  bacitracin-polymyxin b (POLYSPORIN) ophthalmic ointment Place 1 application into the left eye every 12 (twelve) hours. apply to eye every 12 hours while awake Patient not taking: Reported on 09/24/2016 04/16/16   Delano Metz, FNP  OXcarbazepine (TRILEPTAL) 150 MG tablet  12/15/15   [provider]    Family History Family History  Problem Relation Age of Onset  . Stroke Father   . Psychosis Sister   . Hypertension Unknown    . Diabetes Unknown   . Dementia Unknown     Social History Social History  Substance Use Topics  . Smoking status: Never Smoker  . Smokeless tobacco: Never Used  . Alcohol use No     Allergies   Banana; Sulfacetamide sodium; Penicillins; and Sulfur   Review of Systems Review of Systems  Constitutional: Positive for chills. Negative for fever.  Respiratory: Negative for shortness of breath.   Cardiovascular: Negative for chest pain.  Gastrointestinal: Positive for vomiting. Negative for abdominal pain and diarrhea.  Musculoskeletal: Positive for myalgias.  Neurological: Positive for seizures and numbness.  All other systems reviewed and are negative.    Physical Exam Updated Vital Signs BP (!) 134/91   Pulse 81   Temp 98.4 F (36.9 C) (Oral)   Resp 20   LMP 07/16/2016   SpO2 98%   Physical Exam  Constitutional: She is oriented to person, place, and  time. She appears well-developed and well-nourished.  On initial assessment patient was staring and unresponsive with alleged seizure episode immediately prior. About 1-2 minutes after walking in patient was able to describe in detail her symptoms, her history, and her being followed by [facility] for her MS in great detail. Patient had soiled herself here during that episode. No bowel incontinence. No evidence of tongue biting, bleeding, or mouth lacerations.  HENT:  Head: Normocephalic and atraumatic.  Nose: Nose normal.  No visible head redness, wound, signs of trauma.   Eyes: Conjunctivae and EOM are normal. Pupils are equal, round, and reactive to light.  Neck: Normal range of motion.  Cardiovascular: Normal rate, normal heart sounds and intact distal pulses.   Pulmonary/Chest: Effort normal and breath sounds normal. No respiratory distress. She has no wheezes.  Normal work of breathing. No respiratory distress noted.   Abdominal: Soft. Bowel sounds are normal. There is no tenderness. There is no rebound and no guarding.   Musculoskeletal: Normal range of motion.  Neurological: She is alert and oriented to person, place, and time. GCS eye subscore is 4. GCS verbal subscore is 5. GCS motor subscore is 6.  Cranial Nerves:  III,IV, VI: ptosis not present, extra-ocular movements intact bilaterally, direct and consensual pupillary light reflexes intact bilaterally V: facial sensation, jaw opening, and bite strength equal bilaterally VII: eyebrow raise, eyelid close, smile, frown, pucker equal bilaterally VIII: hearing grossly normal bilaterally  IX,X: palate elevation and swallowing intact XI: bilateral shoulder shrug and lateral head rotation equal and strong XII: midline tongue extension    States she could not feel lower legs. Pt not able to move upper or lower extremities. Not sure if poor effort.   Skin: Skin is warm.  Psychiatric: She has a normal mood and affect. Her behavior is normal.  Nursing note and vitals reviewed.    ED Treatments / Results  Labs (all labs ordered are listed, but only abnormal results are displayed) Labs Reviewed  CBC - Abnormal; Notable for the following:       Result Value   Hemoglobin 11.4 (*)    HCT 35.5 (*)    MCV 76.0 (*)    MCH 24.4 (*)    RDW 15.7 (*)    All other components within normal limits  COMPREHENSIVE METABOLIC PANEL - Abnormal; Notable for the following:    Potassium 3.2 (*)    ALT 9 (*)    All other components within normal limits  URINALYSIS, ROUTINE W REFLEX MICROSCOPIC - Abnormal; Notable for the following:    Color, Urine STRAW (*)    Hgb urine dipstick MODERATE (*)    Bacteria, UA RARE (*)    Squamous Epithelial / LPF 0-5 (*)    All other components within normal limits  MAGNESIUM  CBG MONITORING, ED  POC URINE PREG, ED    EKG  EKG Interpretation  Date/Time:  Sunday Oct 04 2016 11:44:08 EDT Ventricular Rate:  68 PR Interval:    QRS Duration: 81 QT Interval:  437 QTC Calculation: 465 R Axis:   72 Text Interpretation:  Sinus  rhythm Borderline T abnormalities, anterior leads Baseline wander in lead(s) V6 No significant change since last tracing Confirmed by ISAACS MD, Lysbeth Galas [phone]) on 10/04/2016 2:09:20 PM       Radiology Dg Chest 2 View  Result Date: 10/04/2016 CLINICAL DATA:  Seizure EXAM: CHEST  2 VIEW COMPARISON:  None. FINDINGS: Heart and mediastinal contours are within normal limits. No focal opacities or  effusions. No acute bony abnormality. IMPRESSION: No active cardiopulmonary disease. Electronically Signed   By: Rolm Baptise M.D.   On: 10/04/2016 12:55    Procedures Procedures (including critical care time)  Medications Ordered in ED Medications  Oxcarbazepine (TRILEPTAL) tablet 300 mg (300 mg Oral Given 10/04/16 1447)  LORazepam (ATIVAN) injection 1 mg (1 mg Intravenous Given 10/04/16 1255)  levETIRAcetam (KEPPRA) 1,000 mg in sodium chloride 0.9 % 100 mL IVPB (0 mg Intravenous Stopped 10/04/16 1523)     Initial Impression / Assessment and Plan / ED Course  I have reviewed the triage vital signs and the nursing notes.  Pertinent labs & imaging results that were available during my care of the patient were reviewed by me and considered in my medical decision making (see chart for details).    On initial assessment patient was staring and unresponsive with alleged seizure episode immediately prior. About 1-2 minutes after walking in patient was able to describe in detail her symptoms, her history, and her being followed by [facility] for her MS in great detail. Patient had soiled herself here during that episode. No bowel incontinence. No evidence of tongue biting, bleeding, or mouth lacerations.  On exam patient is alert and oriented 3. She is in no apparent distress, afebrile, hemodynamically stable. Heart and lung sounds clear. Abdomen soft and nontender. Patient stated that she could not feel her legs and arms and was not able to move her extremities. Unsure if this is due to lack of effort. Lab work  is similar to previous findings. CVG is within normal limits. Chest x-ray is negative for any acute process. Urine does not show any signs of infection. Patient given 1 mg of Ativan.  On reassessment patient feeling better and states she is "getting her feeling back."  I spoke with Dr. Shon Hale, Neurology, Recommended to give patient a dose of Keppra here in ED and for her to follow-up with her physicians at [facility] regarding today's visit. He feels safe for her discharge and does not recommend any imaging at this time.  Dr. Ellender Hose, attending, also saw and evaluated the patient and agreed with assessment and plan.   Patient given 1 g of Keppra and trileptal.  Pt unable to ambulate here in ED.   I spoke with Dr. Shon Hale regarding pt not able to ambulate and stated he would be able to see and evaluate pt himself later on if she continued to not be able to walk.   1900   Pt able to stand and ambulate here in ED.   I feel safe for discharge at this time. Patient is hemodynamically stable, afebrile, in no apparent distress. She is agreeable with assessment and plan and verbally understands. Patient is to follow-up with [facility] neurology tomorrow regarding today's visit. Presents to immediately return to the ED discussed.   Final Clinical Impressions(s) / ED Diagnoses   Final diagnoses:  Seizure-like activity [facility])    New Prescriptions Discharge Medication List as of 10/04/2016  7:20 PM       Bettey Costa, [place] 10/04/16 1947    Duffy Bruce, MD 10/05/16 [phone]

## 2016-10-08 ENCOUNTER — Ambulatory Visit (INDEPENDENT_AMBULATORY_CARE_PROVIDER_SITE_OTHER): Payer: BLUE CROSS/BLUE SHIELD

## 2016-10-08 ENCOUNTER — Other Ambulatory Visit: Payer: BLUE CROSS/BLUE SHIELD

## 2016-10-08 DIAGNOSIS — E538 Deficiency of other specified B group vitamins: Secondary | ICD-10-CM

## 2016-10-08 DIAGNOSIS — G35 Multiple sclerosis: Secondary | ICD-10-CM

## 2016-10-08 MED ORDER — CYANOCOBALAMIN 1000 MCG/ML IJ SOLN
1000.0000 ug | Freq: Once | INTRAMUSCULAR | Status: AC
  Administered 2016-10-08: 1000 ug via INTRAMUSCULAR

## 2016-10-09 LAB — URINALYSIS, MICROSCOPIC ONLY
Casts: NONE SEEN [LPF]
Crystals: NONE SEEN [HPF]
Yeast: NONE SEEN [HPF]

## 2016-10-09 LAB — PREGNANCY, URINE: PREG TEST UR: NEGATIVE

## 2016-10-15 ENCOUNTER — Telehealth: Payer: Self-pay | Admitting: Internal Medicine

## 2016-10-15 DIAGNOSIS — G35 Multiple sclerosis: Secondary | ICD-10-CM

## 2016-10-15 DIAGNOSIS — E538 Deficiency of other specified B group vitamins: Secondary | ICD-10-CM

## 2016-10-15 NOTE — Telephone Encounter (Signed)
Pt states her standing MS lab order has expired and when her MS nurse cannot make it to her home, she would like to come to our office to have done.  Pt states [facility] is sending an order today, and would like to know if Dr Raliegh Ip will sign off on these orders.  Pt would like to have these done today iof someone will approve.  Results needs to be faxed to Dr Meyer Cory at [facility] Fax: [phone]

## 2016-10-15 NOTE — Telephone Encounter (Signed)
Pt called office back to see if Dr Raliegh Ip had signed orders... We have not received orders, please see message below. Please advise

## 2016-10-26 NOTE — Telephone Encounter (Signed)
Okay for patient to have standing lab orders here at our office.  This lab request was received from [facility]

## 2016-10-27 NOTE — Telephone Encounter (Signed)
Orders placed in chart, called pt to make her aware

## 2016-10-29 ENCOUNTER — Other Ambulatory Visit: Payer: Self-pay | Admitting: Internal Medicine

## 2016-10-29 DIAGNOSIS — Z5181 Encounter for therapeutic drug level monitoring: Secondary | ICD-10-CM

## 2016-11-04 ENCOUNTER — Telehealth: Payer: Self-pay | Admitting: Internal Medicine

## 2016-11-04 NOTE — Telephone Encounter (Signed)
FYI Pt is calling to let md know [facility] will be sending order for her to have vit d recheck

## 2016-11-06 ENCOUNTER — Other Ambulatory Visit: Payer: Self-pay | Admitting: Internal Medicine

## 2016-11-06 ENCOUNTER — Ambulatory Visit: Payer: BLUE CROSS/BLUE SHIELD

## 2016-11-06 DIAGNOSIS — Z79899 Other long term (current) drug therapy: Secondary | ICD-10-CM

## 2016-11-06 NOTE — Telephone Encounter (Signed)
Pt changed her mind and would like all labs. Pt has been sch

## 2016-11-06 NOTE — Telephone Encounter (Signed)
Order placed in epic

## 2016-11-06 NOTE — Telephone Encounter (Signed)
Pt order from [facility] has arrived. Can I sch VIT D?

## 2016-11-06 NOTE — Telephone Encounter (Signed)
Pt is calling to see if she will be able to get her Vit-D lab done on Monday 11/09/16?  May I have a order please?

## 2016-11-09 ENCOUNTER — Other Ambulatory Visit (INDEPENDENT_AMBULATORY_CARE_PROVIDER_SITE_OTHER): Payer: BLUE CROSS/BLUE SHIELD

## 2016-11-09 ENCOUNTER — Ambulatory Visit (INDEPENDENT_AMBULATORY_CARE_PROVIDER_SITE_OTHER): Payer: BLUE CROSS/BLUE SHIELD

## 2016-11-09 DIAGNOSIS — E538 Deficiency of other specified B group vitamins: Secondary | ICD-10-CM | POA: Diagnosis not present

## 2016-11-09 DIAGNOSIS — Z5181 Encounter for therapeutic drug level monitoring: Secondary | ICD-10-CM

## 2016-11-09 DIAGNOSIS — G35 Multiple sclerosis: Secondary | ICD-10-CM

## 2016-11-09 DIAGNOSIS — Z79899 Other long term (current) drug therapy: Secondary | ICD-10-CM | POA: Diagnosis not present

## 2016-11-09 LAB — POC URINALSYSI DIPSTICK (AUTOMATED)
Bilirubin, UA: NEGATIVE
Blood, UA: NEGATIVE
GLUCOSE UA: NEGATIVE
Ketones, UA: NEGATIVE
LEUKOCYTES UA: NEGATIVE
Nitrite, UA: NEGATIVE
PROTEIN UA: NEGATIVE
SPEC GRAV UA: 1.025 (ref 1.010–1.025)
UROBILINOGEN UA: 0.2 U/dL
pH, UA: 6 (ref 5.0–8.0)

## 2016-11-09 LAB — CBC
HCT: 34 % — ABNORMAL LOW (ref 36.0–46.0)
HEMOGLOBIN: 11.1 g/dL — AB (ref 12.0–15.0)
MCHC: 32.6 g/dL (ref 30.0–36.0)
MCV: 75.9 fl — ABNORMAL LOW (ref 78.0–100.0)
Platelets: 292 10*3/uL (ref 150.0–400.0)
RBC: 4.47 Mil/uL (ref 3.87–5.11)
RDW: 17.2 % — AB (ref 11.5–15.5)
WBC: 5.7 10*3/uL (ref 4.0–10.5)

## 2016-11-09 LAB — COMPREHENSIVE METABOLIC PANEL
ALBUMIN: 4.1 g/dL (ref 3.5–5.2)
ALK PHOS: 102 U/L (ref 39–117)
ALT: 6 U/L (ref 0–35)
AST: 9 U/L (ref 0–37)
BUN: 11 mg/dL (ref 6–23)
CO2: 28 mEq/L (ref 19–32)
Calcium: 8.9 mg/dL (ref 8.4–10.5)
Chloride: 104 mEq/L (ref 96–112)
Creatinine, Ser: 0.89 mg/dL (ref 0.40–1.20)
GFR: 86.85 mL/min (ref >60.00)
Glucose, Bld: 86 mg/dL (ref 70–99)
POTASSIUM: 3.8 meq/L (ref 3.5–5.1)
SODIUM: 138 meq/L (ref 135–145)
TOTAL PROTEIN: 6.9 g/dL (ref 6.0–8.3)
Total Bilirubin: 0.4 mg/dL (ref 0.2–1.2)

## 2016-11-09 LAB — VITAMIN D 25 HYDROXY (VIT D DEFICIENCY, FRACTURES): VITD: 12.63 ng/mL — ABNORMAL LOW (ref 30.00–100.00)

## 2016-11-09 LAB — LIPID PANEL
Cholesterol: 216 mg/dL — ABNORMAL HIGH (ref 0–200)
HDL: 63 mg/dL (ref >39.00)
LDL Cholesterol: 137 mg/dL — ABNORMAL HIGH (ref 0–99)
NonHDL: 152.76
Total CHOL/HDL Ratio: 3
Triglycerides: 79 mg/dL (ref 0.0–149.0)
VLDL: 15.8 mg/dL (ref 0.0–40.0)

## 2016-11-09 LAB — VITAMIN B12: Vitamin B-12: >1500 pg/mL — ABNORMAL HIGH (ref 211–911)

## 2016-11-09 LAB — T4, FREE: Free T4: 0.53 ng/dL — ABNORMAL LOW (ref 0.60–1.60)

## 2016-11-09 MED ORDER — CYANOCOBALAMIN 1000 MCG/ML IJ SOLN
1000.0000 ug | Freq: Once | INTRAMUSCULAR | Status: AC
  Administered 2016-11-09: 1000 ug via INTRAMUSCULAR

## 2016-11-09 NOTE — Progress Notes (Signed)
Pt in this morning to receive a Vitamin B12 injection due to low Vitamin B12 level. She tolerated well. I did remind her to stop by the lab and get her labs draw. She verbalized understanding

## 2016-11-10 ENCOUNTER — Other Ambulatory Visit: Payer: Self-pay | Admitting: Internal Medicine

## 2016-11-10 LAB — URINALYSIS, MICROSCOPIC ONLY
BACTERIA UA: NONE SEEN [HPF]
Casts: NONE SEEN [LPF]
Crystals: NONE SEEN [HPF]
RBC / HPF: NONE SEEN RBC/HPF (ref <=2)
WBC, UA: NONE SEEN WBC/HPF (ref <=5)
YEAST: NONE SEEN [HPF]

## 2016-11-10 LAB — PREGNANCY, URINE: PREG TEST UR: NEGATIVE

## 2016-11-10 MED ORDER — VITAMIN D (ERGOCALCIFEROL) 1.25 MG (50000 UNIT) PO CAPS: 50000.0000 [IU] | ORAL_CAPSULE | ORAL | 0 refills | Status: DC

## 2016-11-27 ENCOUNTER — Telehealth: Payer: Self-pay | Admitting: Internal Medicine

## 2016-11-27 NOTE — Telephone Encounter (Signed)
Fax was sent and confirmed. Will resend today

## 2016-11-27 NOTE — Telephone Encounter (Signed)
° °  Pt called and said she spoke with you last month and you Kathee Delton was suppose to fax over her May 24 and November 09 2016 labs to her MS doctor.  She call today to say that she has spoken to them and they have not received those labs. Please fax the May and June labs to the below number to Dr Pearletha Forge   [phone]

## 2016-11-29 ENCOUNTER — Emergency Department (HOSPITAL_COMMUNITY)
Admission: EM | Admit: 2016-11-29 | Discharge: 2016-11-29 | Disposition: A | Discharge status: Home / Self Care | Payer: BLUE CROSS/BLUE SHIELD | Attending: Emergency Medicine | Admitting: Emergency Medicine

## 2016-11-29 ENCOUNTER — Encounter (HOSPITAL_COMMUNITY): Payer: Self-pay

## 2016-11-29 DIAGNOSIS — R569 Unspecified convulsions: Secondary | ICD-10-CM | POA: Diagnosis not present

## 2016-11-29 DIAGNOSIS — I10 Essential (primary) hypertension: Secondary | ICD-10-CM | POA: Insufficient documentation

## 2016-11-29 DIAGNOSIS — Z79899 Other long term (current) drug therapy: Secondary | ICD-10-CM | POA: Diagnosis not present

## 2016-11-29 DIAGNOSIS — Z8673 Personal history of transient ischemic attack (TIA), and cerebral infarction without residual deficits: Secondary | ICD-10-CM | POA: Insufficient documentation

## 2016-11-29 DIAGNOSIS — Z880 Allergy status to penicillin: Secondary | ICD-10-CM | POA: Diagnosis not present

## 2016-11-29 LAB — CBC WITH DIFFERENTIAL/PLATELET
Basophils Absolute: 0 10*3/uL (ref 0.0–0.1)
Basophils Relative: 0 %
EOS ABS: 0.1 10*3/uL (ref 0.0–0.7)
Eosinophils Relative: 2 %
HEMATOCRIT: 35.1 % — AB (ref 36.0–46.0)
HEMOGLOBIN: 11.3 g/dL — AB (ref 12.0–15.0)
Lymphocytes Relative: 25 %
Lymphs Abs: 1.4 10*3/uL (ref 0.7–4.0)
MCH: 24.6 pg — ABNORMAL LOW (ref 26.0–34.0)
MCHC: 32.2 g/dL (ref 30.0–36.0)
MCV: 76.3 fL — ABNORMAL LOW (ref 78.0–100.0)
MONOS PCT: 6 %
Monocytes Absolute: 0.4 10*3/uL (ref 0.1–1.0)
NEUTROS ABS: 3.9 10*3/uL (ref 1.7–7.7)
NEUTROS PCT: 67 %
Platelets: 247 10*3/uL (ref 150–400)
RBC: 4.6 MIL/uL (ref 3.87–5.11)
RDW: 17 % — ABNORMAL HIGH (ref 11.5–15.5)
WBC: 5.9 10*3/uL (ref 4.0–10.5)

## 2016-11-29 LAB — URINALYSIS, ROUTINE W REFLEX MICROSCOPIC
Bilirubin Urine: NEGATIVE
Glucose, UA: NEGATIVE mg/dL
Hgb urine dipstick: NEGATIVE
Ketones, ur: NEGATIVE mg/dL
Leukocytes, UA: NEGATIVE
NITRITE: NEGATIVE
PH: 6 (ref 5.0–8.0)
Protein, ur: NEGATIVE mg/dL
SPECIFIC GRAVITY, URINE: 1.006 (ref 1.005–1.030)

## 2016-11-29 LAB — BASIC METABOLIC PANEL
ANION GAP: 10 (ref 5–15)
BUN: 7 mg/dL (ref 6–20)
CHLORIDE: 106 mmol/L (ref 101–111)
CO2: 21 mmol/L — AB (ref 22–32)
CREATININE: 0.86 mg/dL (ref 0.44–1.00)
Calcium: 8.8 mg/dL — ABNORMAL LOW (ref 8.9–10.3)
GFR calc non Af Amer: >60 mL/min (ref >60)
Glucose, Bld: 82 mg/dL (ref 65–99)
Potassium: 3.5 mmol/L (ref 3.5–5.1)
SODIUM: 137 mmol/L (ref 135–145)

## 2016-11-29 LAB — CBG MONITORING, ED: GLUCOSE-CAPILLARY: 87 mg/dL (ref 65–99)

## 2016-11-29 LAB — PREGNANCY, URINE: PREG TEST UR: NEGATIVE

## 2016-11-29 MED ORDER — DIPHENHYDRAMINE HCL 25 MG PO CAPS
25.0000 mg | ORAL_CAPSULE | Freq: Once | ORAL | Status: AC
  Administered 2016-11-29: 25 mg via ORAL
  Filled 2016-11-29: qty 1

## 2016-11-29 MED ORDER — ACETAMINOPHEN 500 MG PO TABS
500.0000 mg | ORAL_TABLET | Freq: Once | ORAL | Status: AC
  Administered 2016-11-29: 500 mg via ORAL
  Filled 2016-11-29: qty 1

## 2016-11-29 MED ORDER — SODIUM CHLORIDE 0.9 % IV SOLN
500.0000 mg | Freq: Once | INTRAVENOUS | Status: AC
  Administered 2016-11-29: 500 mg via INTRAVENOUS
  Filled 2016-11-29: qty 5

## 2016-11-29 MED ORDER — TIZANIDINE HCL 4 MG PO TABS
2.0000 mg | ORAL_TABLET | Freq: Once | ORAL | Status: AC
  Administered 2016-11-29: 2 mg via ORAL
  Filled 2016-11-29: qty 1

## 2016-11-29 NOTE — ED Triage Notes (Signed)
Pt brought in by EMS due to having a seizure. Pt has hx of seizures and MS. Per EMS, seizure lasted approximately 2 minutes. Pt c/o neck and back pain. Pt received 4mg  of zofran, 87mcg of fentanyl, and 250cc of NS. Pt a&ox4.

## 2016-11-29 NOTE — ED Notes (Signed)
Pt husband called out stating pt was having a seizure, RN and PA came to room. Pt was unresponsive but VS normal. HR 65 O2 07% on room air. Husband states it lasted about a minute. Pt proceeded to have another "seizure" while RN and PA were in the room. Pt shoulders were shrugging and legs moved. Lasted about 30 seconds then pt opened her eyes and was responsive

## 2016-11-29 NOTE — Discharge Instructions (Signed)
Please attach information regarding your condition. Follow-up with neurologist for further evaluation as soon as possible. Continue home medications as previously prescribed. To ED for worsening seizure activity, head injury, signs of infection, vomiting, chest pain or trouble breathing.

## 2016-11-29 NOTE — ED Provider Notes (Signed)
[facility] DEPT Provider Note   CSN: [id number] Arrival date & time: 11/29/16  1359     History   Chief Complaint Chief Complaint  Patient presents with  . Seizures    HPI [name] is a 51 y.o. female.  HPI  Patient with a past medical history of seizures and MS, presents with seizure prior to arrival. She states that she was at church when all of a sudden she became "unresponsive" and then had to minutes worth of tonic-clonic seizure-like activity. She states that this feels similar to her previous seizures. She reports compliance with her oxcarbazepine and Keppra twice daily. She states she took both medications this morning. She reports some urinary incontinence during the episode as well as a post ictal confusion state. Her husband states that after about 10 minutes she became responsive again. She denies any head injury or bites.  patient currently complains of muscle spasms in her upper back as well as headache and sleepiness. She reports headache after some of her seizures in the past. She also complains of rash to bilateral arms that she also sometimes gives her seizures. Patient is followed by [facility] neurology. She states her most recent MRI done a few months ago showed no new MS lesions and brain or spine. Reports her most recent EEG was done about 2 years ago.  Past Medical History:  Diagnosis Date  . Anemia   . GERD 12/30/2006  . Headache(784.0)   . Hypertension   . Multiple sclerosis ([place])   . PARESTHESIA 12/30/2006  . Stroke [facility])     Patient Active Problem List   Diagnosis Date Noted  . Viral URI 12/23/2015  . Essential hypertension 05/30/2015  . Right-sided chest pain 06/27/2014  . Microcytic anemia 06/27/2014  . Multiple sclerosis exacerbation ([place]) 06/27/2014  . Obesity (BMI 30-39.9) 06/27/2014  . B12 deficiency 06/06/2013  . Multiple sclerosis ([place]) 08/12/2010  . GERD 12/30/2006  . PARESTHESIA 12/30/2006    Past Surgical History:    Procedure Laterality Date  . ANAL INTRAEPITHELIAL NEOPLASIA EXCISION    . BREAST BIOPSY    . CESAREAN SECTION    . Myometomy    . TONSILLECTOMY      OB History    No data available       Home Medications    Prior to Admission medications   Medication Sig Start Date End Date Taking? Authorizing Provider  acetaminophen (TYLENOL) 160 MG/5ML solution Take 320 mg by mouth 2 (two) times daily as needed for mild pain. Reported on 07/21/2015   Yes [provider]  acyclovir (ZOVIRAX) 200 MG capsule Take 200mg  (1 tablet) by mouth twice daily until physician discontinues medication. Start on first day of Lemtrada infusion. 11/28/15  Yes [provider]  Alemtuzumab (LEMTRADA IV) Inject into the vein 5 days. Every September ( yearly )   Yes [provider]  cetirizine (ZYRTEC) 10 MG tablet Take 10 mg by mouth at bedtime.   Yes [provider]  citalopram (CELEXA) 20 MG tablet Take 20 mg by mouth daily.  07/09/16  Yes [provider]  cyanocobalamin (,VITAMIN B-12,) 1000 MCG/ML injection Inject 1 mL (1,000 mcg total) into the muscle every 30 (thirty) days. 06/06/13  Yes Marletta Lor, MD  cyclobenzaprine (FLEXERIL) 10 MG tablet Take 10 mg by mouth 2 (two) times daily as needed for muscle spasms.    Yes [provider]  diphenhydrAMINE (BENADRYL) 12.5 MG/5ML elixir Take 25 mg by mouth 4 (four)  times daily as needed for allergies.   Yes [provider]  famotidine (PEPCID) 40 MG/5ML suspension Take 40 mg by mouth at bedtime.  07/09/16  Yes [provider]  furosemide (LASIX) 40 MG tablet Take 1 tablet (40 mg total) by mouth daily as needed for fluid. 07/16/16  Yes Marletta Lor, MD  HYDROcodone-homatropine (HYDROMET) 5-1.5 MG/5ML syrup Take 5 mLs by mouth every 4 (four) hours as needed. Patient taking differently: Take 5 mLs by mouth every 4 (four) hours as needed for cough.  09/24/16  Yes Laurey Morale, MD  hydrOXYzine  (ATARAX/VISTARIL) 25 MG tablet Take 1 tablet (25 mg total) by mouth 3 (three) times daily as needed. Patient taking differently: Take 25 mg by mouth 3 (three) times daily as needed for itching.  07/24/14  Yes Marletta Lor, MD  ibuprofen (ADVIL,MOTRIN) 200 MG tablet Take 200 mg by mouth every 8 (eight) hours as needed.   Yes [provider]  levETIRAcetam (KEPPRA) 100 MG/ML solution Take 500 mg by mouth 2 (two) times daily. 09/21/16  Yes [provider]  Oxcarbazepine (TRILEPTAL) 300 MG tablet Take 300 mg by mouth 2 (two) times daily.  06/11/16  Yes [provider]  tizanidine (ZANAFLEX) 2 MG capsule Take 2 mg by mouth 2 (two) times daily.  07/09/16  Yes [provider]  Vitamin D, Ergocalciferol, (DRISDOL) 50000 units CAPS capsule Take 1 capsule (50,000 Units total) by mouth every 7 (seven) days. 11/10/16  Yes Marletta Lor, MD    Family History Family History  Problem Relation Age of Onset  . Stroke Father   . Psychosis Sister   . Hypertension Unknown   . Diabetes Unknown   . Dementia Unknown     Social History Social History  Substance Use Topics  . Smoking status: Never Smoker  . Smokeless tobacco: Never Used  . Alcohol use No     Allergies   Banana; Sulfacetamide sodium; and Penicillins   Review of Systems Review of Systems  Constitutional: Positive for fatigue. Negative for appetite change, chills and fever.  HENT: Negative for ear pain, rhinorrhea, sneezing and sore throat.   Eyes: Negative for photophobia and visual disturbance.  Respiratory: Negative for cough, chest tightness, shortness of breath and wheezing.   Cardiovascular: Negative for chest pain and palpitations.  Gastrointestinal: Positive for vomiting. Negative for abdominal pain, blood in stool, constipation, diarrhea and nausea.  Genitourinary: Negative for dysuria, hematuria and urgency.  Musculoskeletal: Negative for myalgias.  Skin: Negative for rash.    Neurological: Positive for seizures and headaches. Negative for dizziness, weakness and light-headedness.     Physical Exam Updated Vital Signs BP 112/79   Pulse 61   Temp 98.2 F (36.8 C) (Oral)   Resp 14   Ht 5\' 3"  (1.6 m)   Wt 104.3 kg (230 lb)   SpO2 96%   BMI 40.74 kg/m   Physical Exam  Constitutional: She is oriented to person, place, and time. She appears well-developed and well-nourished. No distress.  HENT:  Head: Normocephalic and atraumatic.  Nose: Nose normal.  No evidence of oral trauma or laceration.  Eyes: Conjunctivae and EOM are normal. Left eye exhibits no discharge. No scleral icterus.  Neck: Normal range of motion. Neck supple.  Cardiovascular: Normal rate, regular rhythm, normal heart sounds and intact distal pulses.  Exam reveals no gallop and no friction rub.   No murmur heard. Pulmonary/Chest: Effort normal and breath sounds normal. No respiratory distress.  Abdominal:  Soft. Bowel sounds are normal. She exhibits no distension. There is no tenderness. There is no guarding.  Musculoskeletal: Normal range of motion. She exhibits no edema.  Neurological: She is alert and oriented to person, place, and time. A sensory deficit (Lower extremities bilaterally) is present. No cranial nerve deficit. She exhibits normal muscle tone. Coordination normal.  Alert and oriented to person, place and time. Pupils reactive. No facial asymmetry noted. Cranial nerves appear grossly intact. Sensation intact to light touch on face, BUE. Strength 5/5 in BUE and BLE.   Skin: Skin is warm and dry. No rash noted.  Psychiatric: She has a normal mood and affect.  Nursing note and vitals reviewed.    ED Treatments / Results  Labs (all labs ordered are listed, but only abnormal results are displayed) Labs Reviewed  BASIC METABOLIC PANEL - Abnormal; Notable for the following:       Result Value   CO2 21 (*)    Calcium 8.8 (*)    All other components within normal limits  CBC  WITH DIFFERENTIAL/PLATELET - Abnormal; Notable for the following:    Hemoglobin 11.3 (*)    HCT 35.1 (*)    MCV 76.3 (*)    MCH 24.6 (*)    RDW 17.0 (*)    All other components within normal limits  URINALYSIS, ROUTINE W REFLEX MICROSCOPIC - Abnormal; Notable for the following:    Color, Urine STRAW (*)    All other components within normal limits  PREGNANCY, URINE  CBG MONITORING, ED    EKG  EKG Interpretation None       Radiology No results found.  Procedures Procedures (including critical care time)  Medications Ordered in ED Medications  tiZANidine (ZANAFLEX) tablet 2 mg (2 mg Oral Given 11/29/16 1638)  diphenhydrAMINE (BENADRYL) capsule 25 mg (25 mg Oral Given 11/29/16 1639)  acetaminophen (TYLENOL) tablet 500 mg (500 mg Oral Given 11/29/16 1707)  levETIRAcetam (KEPPRA) 500 mg in sodium chloride 0.9 % 100 mL IVPB (500 mg Intravenous New Bag/Given 11/29/16 1817)     Initial Impression / Assessment and Plan / ED Course  I have reviewed the triage vital signs and the nursing notes.  Pertinent labs & imaging results that were available during my care of the patient were reviewed by me and considered in my medical decision making (see chart for details).     Patient presents to the ED for seizure that occurred prior to arrival. Patient has a history of MS and seizures in the past. She reports tonic-clonic activity that lasted 2 minutes that feels similar to her previous seizures. She currently reports headache and sleepiness as well as a rash on bilateral arms which she states is all consistent with her previous seizures. Due to the presence of headache being consistent with her previous seizures, no need for further imaging at this time. She reports compliance with her home oxcarbazepine and Keppra. She is followed by [facility] neurology for her MS, who states that in her most recent MRI there were no new lesions in the brain or spine. There are currently no focal deficits on  neurological exam on physical exam on today's visit. She is alert and oriented 3. She is afebrile with no history of fever. Oxygen saturation is 99% on room air. CBG 87. CBC and BMP unremarkable. Urinalysis unremarkable. Patient's blood pressure elevated to 150s/100s here in the ED. She does report a spike in her BP after her seizures in the past. Patient given Zanaflex, Benadryl  and Tylenol here in the ED for relief of symptoms. 1630: Family reports that patient has had another episode of seizure-like activity. She appears post-ictal at this time on my exam. Will give Keppra 500 milligrams IV and monitor patient. 1845: Patient reports feeling sleepy but feels back to her baseline. She denies any subsequent seizure activity since giving Keppra. She states that she is hungry but is unable to eat any of the food that we are able to offer her here in the hospital, due to medications. Rash and headache improved here in the ED. Patient appears stable for discharge at this time with follow-up with neurologist for further evaluation. I advised patient to continue all medications as previously prescribed. Strict return precautions given.  Final Clinical Impressions(s) / ED Diagnoses   Final diagnoses:  Seizure [facility])    New Prescriptions New Prescriptions   No medications on file     Delia Heady, Hershal Coria 11/29/16 1851    Carmin Muskrat, MD 11/29/16 2345

## 2016-12-07 ENCOUNTER — Ambulatory Visit (INDEPENDENT_AMBULATORY_CARE_PROVIDER_SITE_OTHER): Payer: BLUE CROSS/BLUE SHIELD

## 2016-12-07 DIAGNOSIS — Q232 Congenital mitral stenosis: Secondary | ICD-10-CM

## 2016-12-07 DIAGNOSIS — G35 Multiple sclerosis: Secondary | ICD-10-CM

## 2016-12-07 DIAGNOSIS — E538 Deficiency of other specified B group vitamins: Secondary | ICD-10-CM

## 2016-12-07 LAB — CBC WITH DIFFERENTIAL/PLATELET
BASOS ABS: 49 {cells}/uL (ref 0–200)
Basophils Relative: 1 %
EOS ABS: 147 {cells}/uL (ref 15–500)
Eosinophils Relative: 3 %
HCT: 35.1 % (ref 35.0–45.0)
Hemoglobin: 11.2 g/dL — ABNORMAL LOW (ref 11.7–15.5)
LYMPHS PCT: 31 %
Lymphs Abs: 1519 cells/uL (ref 850–3900)
MCH: 24.7 pg — AB (ref 27.0–33.0)
MCHC: 31.9 g/dL — ABNORMAL LOW (ref 32.0–36.0)
MCV: 77.3 fL — AB (ref 80.0–100.0)
MPV: 10.1 fL (ref 7.5–12.5)
Monocytes Absolute: 392 cells/uL (ref 200–950)
Monocytes Relative: 8 %
Neutro Abs: 2793 cells/uL (ref 1500–7800)
Neutrophils Relative %: 57 %
Platelets: 245 10*3/uL (ref 140–400)
RBC: 4.54 MIL/uL (ref 3.80–5.10)
RDW: 18.1 % — AB (ref 11.0–15.0)
WBC: 4.9 10*3/uL (ref 3.8–10.8)

## 2016-12-07 LAB — COMPREHENSIVE METABOLIC PANEL
ALT: 6 U/L (ref 6–29)
AST: 11 U/L (ref 10–35)
Albumin: 3.9 g/dL (ref 3.6–5.1)
Alkaline Phosphatase: 98 U/L (ref 33–115)
BILIRUBIN TOTAL: 0.4 mg/dL (ref 0.2–1.2)
BUN: 11 mg/dL (ref 7–25)
CO2: 26 mmol/L (ref 20–31)
CREATININE: 0.92 mg/dL (ref 0.50–1.10)
Calcium: 8.6 mg/dL (ref 8.6–10.2)
Chloride: 105 mmol/L (ref 98–110)
GLUCOSE: 82 mg/dL (ref 65–99)
Potassium: 4.1 mmol/L (ref 3.5–5.3)
SODIUM: 138 mmol/L (ref 135–146)
Total Protein: 6.8 g/dL (ref 6.1–8.1)

## 2016-12-07 MED ORDER — CYANOCOBALAMIN 1000 MCG/ML IJ SOLN
1000.0000 ug | Freq: Once | INTRAMUSCULAR | Status: AC
  Administered 2016-12-07: 1000 ug via INTRAMUSCULAR

## 2016-12-08 LAB — URINALYSIS, MICROSCOPIC ONLY
Casts: NONE SEEN [LPF]
Crystals: NONE SEEN [HPF]
RBC / HPF: NONE SEEN RBC/HPF (ref <=2)
YEAST: NONE SEEN [HPF]

## 2016-12-08 LAB — MAGNESIUM: Magnesium: 2.2 mg/dL (ref 1.5–2.5)

## 2016-12-08 LAB — PREGNANCY, URINE: Preg Test, Ur: NEGATIVE

## 2016-12-22 ENCOUNTER — Telehealth: Payer: Self-pay | Admitting: Internal Medicine

## 2016-12-22 NOTE — Telephone Encounter (Signed)
° ° ° °  Pt would like a call back she said she has a question about her labs    [phone]

## 2016-12-24 ENCOUNTER — Other Ambulatory Visit: Payer: Self-pay | Admitting: Internal Medicine

## 2016-12-24 NOTE — Telephone Encounter (Signed)
Left message on voicemail to call office.  

## 2016-12-25 NOTE — Telephone Encounter (Signed)
Left message on voicemail to call office.  

## 2017-01-01 ENCOUNTER — Telehealth: Payer: Self-pay | Admitting: Internal Medicine

## 2017-01-01 NOTE — Telephone Encounter (Signed)
° ° °  Pt call to ask if you would call her back. Said she need to make some appointments and would like to speak with Dr Raliegh Ip nurse before making the appts    [phone]

## 2017-01-04 NOTE — Telephone Encounter (Signed)
Nurse visit appt for B12 shot made.

## 2017-01-07 ENCOUNTER — Ambulatory Visit (INDEPENDENT_AMBULATORY_CARE_PROVIDER_SITE_OTHER): Payer: BLUE CROSS/BLUE SHIELD

## 2017-01-07 ENCOUNTER — Other Ambulatory Visit (INDEPENDENT_AMBULATORY_CARE_PROVIDER_SITE_OTHER): Payer: BLUE CROSS/BLUE SHIELD

## 2017-01-07 ENCOUNTER — Ambulatory Visit: Payer: BLUE CROSS/BLUE SHIELD

## 2017-01-07 DIAGNOSIS — G35 Multiple sclerosis: Secondary | ICD-10-CM | POA: Diagnosis not present

## 2017-01-07 DIAGNOSIS — E538 Deficiency of other specified B group vitamins: Secondary | ICD-10-CM

## 2017-01-07 DIAGNOSIS — Q232 Congenital mitral stenosis: Secondary | ICD-10-CM

## 2017-01-07 DIAGNOSIS — G35D Multiple sclerosis, unspecified: Secondary | ICD-10-CM

## 2017-01-07 MED ORDER — CYANOCOBALAMIN 1000 MCG/ML IJ SOLN
1000.0000 ug | Freq: Once | INTRAMUSCULAR | Status: AC
  Administered 2017-01-07: 1000 ug via INTRAMUSCULAR

## 2017-01-08 LAB — MAGNESIUM: Magnesium: 2.1 mg/dL (ref 1.5–2.5)

## 2017-01-08 LAB — COMPREHENSIVE METABOLIC PANEL
ALBUMIN: 4.1 g/dL (ref 3.6–5.1)
ALT: 7 U/L (ref 6–29)
AST: 11 U/L (ref 10–35)
Alkaline Phosphatase: 107 U/L (ref 33–115)
BILIRUBIN TOTAL: 0.2 mg/dL (ref 0.2–1.2)
BUN: 10 mg/dL (ref 7–25)
CALCIUM: 9 mg/dL (ref 8.6–10.2)
CO2: 24 mmol/L (ref 20–32)
CREATININE: 0.89 mg/dL (ref 0.50–1.10)
Chloride: 103 mmol/L (ref 98–110)
Glucose, Bld: 75 mg/dL (ref 65–99)
Potassium: 4.4 mmol/L (ref 3.5–5.3)
SODIUM: 138 mmol/L (ref 135–146)
TOTAL PROTEIN: 6.9 g/dL (ref 6.1–8.1)

## 2017-01-08 LAB — CBC WITH DIFFERENTIAL/PLATELET
BASOS ABS: 61 {cells}/uL (ref 0–200)
Basophils Relative: 1 %
Eosinophils Absolute: 183 cells/uL (ref 15–500)
Eosinophils Relative: 3 %
HEMATOCRIT: 36.3 % (ref 35.0–45.0)
HEMOGLOBIN: 11.4 g/dL — AB (ref 11.7–15.5)
Lymphocytes Relative: 26 %
Lymphs Abs: 1586 cells/uL (ref 850–3900)
MCH: 24.8 pg — AB (ref 27.0–33.0)
MCHC: 31.4 g/dL — AB (ref 32.0–36.0)
MCV: 78.9 fL — ABNORMAL LOW (ref 80.0–100.0)
MONO ABS: 488 {cells}/uL (ref 200–950)
MPV: 10.1 fL (ref 7.5–12.5)
Monocytes Relative: 8 %
NEUTROS ABS: 3782 {cells}/uL (ref 1500–7800)
NEUTROS PCT: 62 %
Platelets: 264 10*3/uL (ref 140–400)
RBC: 4.6 MIL/uL (ref 3.80–5.10)
RDW: 18.5 % — ABNORMAL HIGH (ref 11.0–15.0)
WBC: 6.1 10*3/uL (ref 3.8–10.8)

## 2017-01-08 LAB — URINALYSIS, MICROSCOPIC ONLY
Bacteria, UA: NONE SEEN [HPF]
CASTS: NONE SEEN [LPF]
Yeast: NONE SEEN [HPF]

## 2017-01-08 LAB — PREGNANCY, URINE: Preg Test, Ur: NEGATIVE

## 2017-01-11 ENCOUNTER — Telehealth: Payer: Self-pay | Admitting: Internal Medicine

## 2017-01-11 NOTE — Telephone Encounter (Signed)
Please advise 

## 2017-01-11 NOTE — Telephone Encounter (Signed)
Take a calcium supplement, plus [phone] units of vitamin D

## 2017-01-11 NOTE — Telephone Encounter (Signed)
Patient would like a call back to clarify what milligram vitamin D supplement she needs to take

## 2017-01-12 ENCOUNTER — Ambulatory Visit (INDEPENDENT_AMBULATORY_CARE_PROVIDER_SITE_OTHER): Payer: BLUE CROSS/BLUE SHIELD | Admitting: Internal Medicine

## 2017-01-12 ENCOUNTER — Encounter: Payer: Self-pay | Admitting: Internal Medicine

## 2017-01-12 VITALS — BP 122/76 | HR 94 | Temp 97.9°F | Ht 63.0 in | Wt 237.0 lb

## 2017-01-12 DIAGNOSIS — L508 Other urticaria: Secondary | ICD-10-CM

## 2017-01-12 DIAGNOSIS — L299 Pruritus, unspecified: Secondary | ICD-10-CM | POA: Diagnosis not present

## 2017-01-12 DIAGNOSIS — I10 Essential (primary) hypertension: Secondary | ICD-10-CM | POA: Diagnosis not present

## 2017-01-12 MED ORDER — TRIAMCINOLONE ACETONIDE 0.1 % EX CREA: 1.0000 "application " | TOPICAL_CREAM | Freq: Two times a day (BID) | CUTANEOUS | 0 refills | Status: DC

## 2017-01-12 MED ORDER — METHYLPREDNISOLONE ACETATE 80 MG/ML IJ SUSP
80.0000 mg | Freq: Once | INTRAMUSCULAR | Status: AC
  Administered 2017-01-12: 80 mg via INTRAMUSCULAR

## 2017-01-12 MED ORDER — HYDROXYZINE HCL 25 MG PO TABS: 25.0000 mg | ORAL_TABLET | Freq: Three times a day (TID) | ORAL | 2 refills | Status: DC | PRN

## 2017-01-12 NOTE — Telephone Encounter (Signed)
Pt is in the office to day for an appointment, made her aware,

## 2017-01-12 NOTE — Progress Notes (Signed)
Subjective:    Patient ID: [name], female    DOB: Aug 15, 1965, 51 y.o.   MRN: [id number]  HPI  51 year old patient who has a history of MS and hypertension. For the past 2 or 3 weeks she has had some generalized itching.  This has a more severe involving the left arm and has resulted in considerable scratching often during the night.  She has developed a worsening itching and a slight rash involving the left lower arm.  No other dermatitis She has had similar issues in the past, which has responded to hydroxyzine.  Presently she is on Zyrtec without much benefit  Past Medical History:  Diagnosis Date  . Anemia   . GERD 12/30/2006  . Headache(784.0)   . Hypertension   . Multiple sclerosis ([place])   . PARESTHESIA 12/30/2006  . Stroke [facility])      Social History   Social History  . Marital status: Married    Spouse name: [name]  . Number of children: 0  . Years of education: College   Occupational History  .  [employer]  . 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 Smoking status: Never Smoker  . Smokeless tobacco: Never Used  . Alcohol use No  . Drug use: No  . Sexual activity: Not on file   Other Topics Concern  . Not on file   Social History Narrative   Pt lives at home family.   Caffeine Use: 2 sodas daily    Past Surgical History:  Procedure Laterality Date  . ANAL INTRAEPITHELIAL NEOPLASIA EXCISION    . BREAST BIOPSY    . CESAREAN SECTION    . Myometomy    . TONSILLECTOMY      Family History  Problem Relation Age of Onset  . Stroke Father   . Psychosis Sister   . Hypertension Unknown   . Diabetes Unknown   . Dementia Unknown     Allergies  Allergen Reactions  . Banana Swelling    Lips, tongue and face  . Sulfacetamide Sodium Other (See Comments)    Syncope episode and was incoherent  . Penicillins Diarrhea and Nausea And Vomiting    Has patient had a PCN reaction causing immediate rash,  facial/tongue/throat swelling, SOB or lightheadedness with hypotension: Yes Has patient had a PCN reaction causing severe rash involving mucus membranes or skin necrosis: No Has patient had a PCN reaction that required hospitalization No Has patient had a PCN reaction occurring within the last 10 years: No If all of the above answers are "NO", then may proceed with Cephalosporin use.     Current Outpatient Prescriptions on File Prior to Visit  Medication Sig Dispense Refill  . acetaminophen (TYLENOL) 160 MG/5ML solution Take 320 mg by mouth 2 (two) times daily as needed for mild pain. Reported on 07/21/2015    . acyclovir (ZOVIRAX) 200 MG capsule Take 200mg  (1 tablet) by mouth twice daily until physician discontinues medication. Start on first day of Lemtrada infusion.    . Alemtuzumab (LEMTRADA IV) Inject into the vein 5 days. Every September ( yearly )    . cetirizine (ZYRTEC) 10 MG tablet Take 10 mg by mouth at bedtime.    . citalopram (CELEXA) 20 MG tablet Take 20 mg by mouth daily.     . cyanocobalamin (,VITAMIN B-12,) 1000 MCG/ML injection Inject 1 mL (1,000 mcg total) into the muscle every 30 (thirty) days. 10 mL 1  .  cyclobenzaprine (FLEXERIL) 10 MG tablet Take 10 mg by mouth 2 (two) times daily as needed for muscle spasms.     . diphenhydrAMINE (BENADRYL) 12.5 MG/5ML elixir Take 25 mg by mouth 4 (four) times daily as needed for allergies.    . famotidine (PEPCID) 40 MG/5ML suspension Take 40 mg by mouth at bedtime.     . furosemide (LASIX) 40 MG tablet Take 1 tablet (40 mg total) by mouth daily as needed for fluid. 60 tablet 2  . HYDROcodone-homatropine (HYDROMET) 5-1.5 MG/5ML syrup Take 5 mLs by mouth every 4 (four) hours as needed. (Patient taking differently: Take 5 mLs by mouth every 4 (four) hours as needed for cough. ) 240 mL 0  . hydrOXYzine (ATARAX/VISTARIL) 25 MG tablet Take 1 tablet (25 mg total) by mouth 3 (three) times daily as needed. (Patient taking differently: Take 25 mg  by mouth 3 (three) times daily as needed for itching. ) 60 tablet 3  . ibuprofen (ADVIL,MOTRIN) 200 MG tablet Take 200 mg by mouth every 8 (eight) hours as needed.    . levETIRAcetam (KEPPRA) 100 MG/ML solution Take 500 mg by mouth 2 (two) times daily.  3  . Oxcarbazepine (TRILEPTAL) 300 MG tablet Take 300 mg by mouth 2 (two) times daily.     . tizanidine (ZANAFLEX) 2 MG capsule Take 2 mg by mouth 2 (two) times daily.     . Vitamin D, Ergocalciferol, (DRISDOL) 50000 units CAPS capsule TAKE 1 CAPSULE (50,000 UNITS TOTAL) BY MOUTH EVERY 7 (SEVEN) DAYS. 8 capsule 0   Current Facility-Administered Medications on File Prior to Visit  Medication Dose Route Frequency Provider Last Rate Last Dose  . cyanocobalamin ((VITAMIN B-12)) injection 1,000 mcg  1,000 mcg Intramuscular Once Marletta Lor, MD        BP 122/76 (BP Location: Left Arm, Patient Position: Sitting, Cuff Size: Normal)   Pulse 94   Temp 97.9 F (36.6 C) (Oral)   Ht 5\' 3"  (1.6 m)   Wt 237 lb (107.5 kg)   SpO2 98%   BMI 41.98 kg/m     Review of Systems  Constitutional: Negative.   HENT: Negative for congestion, dental problem, hearing loss, rhinorrhea, sinus pressure, sore throat and tinnitus.   Eyes: Negative for pain, discharge and visual disturbance.  Respiratory: Negative for cough and shortness of breath.   Cardiovascular: Negative for chest pain, palpitations and leg swelling.  Gastrointestinal: Negative for abdominal distention, abdominal pain, blood in stool, constipation, diarrhea, nausea and vomiting.  Genitourinary: Negative for difficulty urinating, dysuria, flank pain, frequency, hematuria, pelvic pain, urgency, vaginal bleeding, vaginal discharge and vaginal pain.  Musculoskeletal: Negative for arthralgias, gait problem and joint swelling.  Skin: Positive for rash.  Neurological: Negative for dizziness, syncope, speech difficulty, weakness, numbness and headaches.  Hematological: Negative for adenopathy.    Psychiatric/Behavioral: Negative for agitation, behavioral problems and dysphoric mood. The patient is not nervous/anxious.        Objective:   Physical Exam  Constitutional: She appears well-developed and well-nourished. No distress.  HENT:  Right Ear: External ear normal.  Left Ear: External ear normal.  Mouth/Throat: Oropharynx is clear and moist.  Eyes: Pupils are equal, round, and reactive to light. Conjunctivae are normal.  Neck: Neck supple.  Cardiovascular: Normal rate and regular rhythm.   Pulmonary/Chest: Effort normal and breath sounds normal.  Skin:  Erythema and urticaria involving the inner aspect of the left lower arm.          Assessment &  Plan:   Pruritus/urticaria.  We'll treat with H1 and H2 blocker therapy.  Depo-Medrol 80 mg IM, dispensed We'll also treat with the topical triamcinolone.  Short-term  Nyoka Cowden

## 2017-01-12 NOTE — Patient Instructions (Addendum)
Pruritus  Pruritus is an itching feeling. There are many different conditions and factors that can make your skin itchy. Dry skin is one of the most common causes of itching. Most cases of itching do not require medical attention. Itchy skin can turn into a rash.  Follow these instructions at home:  Watch your pruritus for any changes. Take these steps to help with your condition:  Skin Care  · Moisturize your skin as needed. A moisturizer that contains petroleum jelly is best for keeping moisture in your skin.  · Take or apply medicines only as directed by your health care provider. This may include:  ? Corticosteroid cream.  ? Anti-itch lotions.  ? Oral anti-histamines.  · Apply cool compresses to the affected areas.  · Try taking a bath with:  ? Epsom salts. Follow the instructions on the packaging. You can get these at your local pharmacy or grocery store.  ? Baking soda. Pour a small amount into the bath as directed by your health care provider.  ? Colloidal oatmeal. Follow the instructions on the packaging. You can get this at your local pharmacy or grocery store.  · Try applying baking soda paste to your skin. Stir water into baking soda until it reaches a paste-like consistency.  · Do not scratch your skin.  · Avoid hot showers or baths, which can make itching worse. A cold shower may help with itching as long as you use a moisturizer after.  · Avoid scented soaps, detergents, and perfumes. Use gentle soaps, detergents, perfumes, and other cosmetic products.  General instructions  · Avoid wearing tight clothes.  · Keep a journal to help track what causes your itch. Write down:  ? What you eat.  ? What cosmetic products you use.  ? What you drink.  ? What you wear. This includes jewelry.  · Use a humidifier. This keeps the air moist, which helps to prevent dry skin.  Contact a health care provider if:  · The itching does not go away after several days.  · You sweat at night.  · You have weight loss.  · You  are unusually thirsty.  · You urinate more than normal.  · You are more tired than normal.  · You have abdominal pain.  · Your skin tingles.  · You feel weak.  · Your skin or the whites of your eyes look yellow (jaundice).  · Your skin feels numb.  This information is not intended to replace advice given to you by your health care provider. Make sure you discuss any questions you have with your health care provider.  Document Released: 01/14/2011 Document Revised: 10/10/2015 Document Reviewed: 04/30/2014  Elsevier Interactive Patient Education © 2018 Elsevier Inc.

## 2017-01-19 ENCOUNTER — Telehealth: Payer: Self-pay | Admitting: Internal Medicine

## 2017-01-19 ENCOUNTER — Other Ambulatory Visit: Payer: Self-pay | Admitting: Internal Medicine

## 2017-01-19 DIAGNOSIS — G35 Multiple sclerosis: Secondary | ICD-10-CM

## 2017-01-19 NOTE — Telephone Encounter (Signed)
° ° ° °  Pt call to ask if you Kathee Delton would give her a call back concering a lab that she need to have done   [phone]

## 2017-01-19 NOTE — Telephone Encounter (Signed)
Spoke with pt, UA order placed in epic as requested by her neuro Dr.

## 2017-01-21 ENCOUNTER — Other Ambulatory Visit: Payer: Self-pay | Admitting: Internal Medicine

## 2017-01-21 DIAGNOSIS — G35 Multiple sclerosis: Secondary | ICD-10-CM

## 2017-01-21 DIAGNOSIS — Z79899 Other long term (current) drug therapy: Secondary | ICD-10-CM

## 2017-01-22 ENCOUNTER — Other Ambulatory Visit (INDEPENDENT_AMBULATORY_CARE_PROVIDER_SITE_OTHER): Payer: BLUE CROSS/BLUE SHIELD

## 2017-01-22 ENCOUNTER — Telehealth: Payer: Self-pay | Admitting: Internal Medicine

## 2017-01-22 DIAGNOSIS — Z79899 Other long term (current) drug therapy: Secondary | ICD-10-CM

## 2017-01-22 DIAGNOSIS — G35 Multiple sclerosis: Secondary | ICD-10-CM

## 2017-01-22 LAB — TSH: TSH: 2.18 u[IU]/mL (ref 0.35–4.50)

## 2017-01-22 LAB — URINALYSIS, ROUTINE W REFLEX MICROSCOPIC
HGB URINE DIPSTICK: NEGATIVE
Leukocytes, UA: NEGATIVE
NITRITE: NEGATIVE
RBC / HPF: NONE SEEN (ref 0–?)
Specific Gravity, Urine: >=1.03 — AB (ref 1.000–1.030)
Total Protein, Urine: 30 — AB
Urine Glucose: NEGATIVE
Urobilinogen, UA: 0.2 (ref 0.0–1.0)
pH: 5.5 (ref 5.0–8.0)

## 2017-01-22 LAB — T4, FREE: Free T4: 0.56 ng/dL — ABNORMAL LOW (ref 0.60–1.60)

## 2017-01-22 NOTE — Telephone Encounter (Signed)
Pt would like to know if you received info from her physician at [facility]?

## 2017-01-22 NOTE — Telephone Encounter (Signed)
Spoke with pt and informed her that the orders requested from her MD at [facility] were received. Pt verbalized understanding.

## 2017-01-25 ENCOUNTER — Telehealth: Payer: Self-pay | Admitting: Internal Medicine

## 2017-01-25 NOTE — Telephone Encounter (Signed)
[facility] neurology called and asked for the recent labs done on 9/7 to be faxed to their office. Labs have been printed & faxed over to their office.

## 2017-02-04 ENCOUNTER — Encounter: Payer: Self-pay | Admitting: Internal Medicine

## 2017-03-15 ENCOUNTER — Ambulatory Visit (INDEPENDENT_AMBULATORY_CARE_PROVIDER_SITE_OTHER): Payer: BLUE CROSS/BLUE SHIELD | Admitting: *Deleted

## 2017-03-15 DIAGNOSIS — E538 Deficiency of other specified B group vitamins: Secondary | ICD-10-CM | POA: Diagnosis not present

## 2017-03-15 MED ORDER — CYANOCOBALAMIN 1000 MCG/ML IJ SOLN
1000.0000 ug | Freq: Once | INTRAMUSCULAR | Status: AC
  Administered 2017-03-15: 1000 ug via INTRAMUSCULAR

## 2017-03-22 ENCOUNTER — Other Ambulatory Visit: Payer: Self-pay | Admitting: Obstetrics & Gynecology

## 2017-03-22 DIAGNOSIS — Z1231 Encounter for screening mammogram for malignant neoplasm of breast: Secondary | ICD-10-CM

## 2017-03-23 ENCOUNTER — Other Ambulatory Visit: Payer: Self-pay | Admitting: Internal Medicine

## 2017-04-15 ENCOUNTER — Ambulatory Visit (INDEPENDENT_AMBULATORY_CARE_PROVIDER_SITE_OTHER): Payer: BLUE CROSS/BLUE SHIELD

## 2017-04-15 DIAGNOSIS — E538 Deficiency of other specified B group vitamins: Secondary | ICD-10-CM | POA: Diagnosis not present

## 2017-04-15 MED ORDER — CYANOCOBALAMIN 1000 MCG/ML IJ SOLN
1000.0000 ug | Freq: Once | INTRAMUSCULAR | Status: AC
  Administered 2017-04-15: 1000 ug via INTRAMUSCULAR

## 2017-05-06 ENCOUNTER — Other Ambulatory Visit: Payer: Self-pay | Admitting: Internal Medicine

## 2017-05-17 ENCOUNTER — Ambulatory Visit
Admission: RE | Admit: 2017-05-17 | Discharge: 2017-05-17 | Disposition: A | Discharge status: Home / Self Care | Payer: BLUE CROSS/BLUE SHIELD | Source: Ambulatory Visit | Attending: Obstetrics & Gynecology | Admitting: Obstetrics & Gynecology

## 2017-05-17 ENCOUNTER — Ambulatory Visit (INDEPENDENT_AMBULATORY_CARE_PROVIDER_SITE_OTHER): Payer: BLUE CROSS/BLUE SHIELD

## 2017-05-17 DIAGNOSIS — E538 Deficiency of other specified B group vitamins: Secondary | ICD-10-CM

## 2017-05-17 DIAGNOSIS — Z1231 Encounter for screening mammogram for malignant neoplasm of breast: Secondary | ICD-10-CM

## 2017-05-17 MED ORDER — CYANOCOBALAMIN 1000 MCG/ML IJ SOLN
1000.0000 ug | Freq: Once | INTRAMUSCULAR | Status: AC
  Administered 2017-05-17: 1000 ug via INTRAMUSCULAR

## 2017-05-17 NOTE — Progress Notes (Signed)
Pt had an appointment for her B12 injection, pt orelated the injection well.

## 2017-06-16 ENCOUNTER — Ambulatory Visit (INDEPENDENT_AMBULATORY_CARE_PROVIDER_SITE_OTHER): Payer: BLUE CROSS/BLUE SHIELD

## 2017-06-16 DIAGNOSIS — E538 Deficiency of other specified B group vitamins: Secondary | ICD-10-CM | POA: Diagnosis not present

## 2017-06-16 MED ORDER — CYANOCOBALAMIN 1000 MCG/ML IJ SOLN
1000.0000 ug | Freq: Once | INTRAMUSCULAR | Status: AC
  Administered 2017-06-16: 1000 ug via INTRAMUSCULAR

## 2017-06-18 ENCOUNTER — Other Ambulatory Visit: Payer: Self-pay | Admitting: Internal Medicine

## 2017-06-24 ENCOUNTER — Encounter: Payer: Self-pay | Admitting: Family Medicine

## 2017-06-24 ENCOUNTER — Ambulatory Visit: Payer: BLUE CROSS/BLUE SHIELD | Admitting: Family Medicine

## 2017-06-24 VITALS — BP 124/80 | HR 76 | Temp 98.1°F | Wt 253.6 lb

## 2017-06-24 DIAGNOSIS — R109 Unspecified abdominal pain: Secondary | ICD-10-CM

## 2017-06-24 DIAGNOSIS — Q232 Congenital mitral stenosis: Secondary | ICD-10-CM

## 2017-06-24 DIAGNOSIS — D509 Iron deficiency anemia, unspecified: Secondary | ICD-10-CM

## 2017-06-24 DIAGNOSIS — R209 Unspecified disturbances of skin sensation: Secondary | ICD-10-CM

## 2017-06-24 DIAGNOSIS — I10 Essential (primary) hypertension: Secondary | ICD-10-CM | POA: Diagnosis not present

## 2017-06-24 DIAGNOSIS — G35 Multiple sclerosis: Secondary | ICD-10-CM | POA: Diagnosis not present

## 2017-06-24 LAB — CBC WITH DIFFERENTIAL/PLATELET
BASOS ABS: 0 10*3/uL (ref 0.0–0.1)
Basophils Relative: 0.6 % (ref 0.0–3.0)
EOS ABS: 0.2 10*3/uL (ref 0.0–0.7)
Eosinophils Relative: 2.9 % (ref 0.0–5.0)
HEMATOCRIT: 38.3 % (ref 36.0–46.0)
Hemoglobin: 12.4 g/dL (ref 12.0–15.0)
LYMPHS PCT: 13.6 % (ref 12.0–46.0)
Lymphs Abs: 0.8 10*3/uL (ref 0.7–4.0)
MCHC: 32.5 g/dL (ref 30.0–36.0)
MCV: 80.2 fl (ref 78.0–100.0)
Monocytes Absolute: 0.4 10*3/uL (ref 0.1–1.0)
Monocytes Relative: 7.8 % (ref 3.0–12.0)
NEUTROS ABS: 4.3 10*3/uL (ref 1.4–7.7)
Neutrophils Relative %: 75.1 % (ref 43.0–77.0)
PLATELETS: 254 10*3/uL (ref 150.0–400.0)
RBC: 4.77 Mil/uL (ref 3.87–5.11)
RDW: 15.5 % (ref 11.5–15.5)
WBC: 5.8 10*3/uL (ref 4.0–10.5)

## 2017-06-24 LAB — COMPREHENSIVE METABOLIC PANEL
ALK PHOS: 91 U/L (ref 39–117)
ALT: 10 U/L (ref 0–35)
AST: 13 U/L (ref 0–37)
Albumin: 4.2 g/dL (ref 3.5–5.2)
BUN: 12 mg/dL (ref 6–23)
CHLORIDE: 103 meq/L (ref 96–112)
CO2: 29 meq/L (ref 19–32)
Calcium: 9 mg/dL (ref 8.4–10.5)
Creatinine, Ser: 0.97 mg/dL (ref 0.40–1.20)
GFR: 78.43 mL/min (ref >60.00)
GLUCOSE: 95 mg/dL (ref 70–99)
POTASSIUM: 4.1 meq/L (ref 3.5–5.1)
Sodium: 139 mEq/L (ref 135–145)
Total Bilirubin: 0.4 mg/dL (ref 0.2–1.2)
Total Protein: 7.3 g/dL (ref 6.0–8.3)

## 2017-06-24 LAB — URINALYSIS, MICROSCOPIC ONLY

## 2017-06-24 LAB — POCT URINALYSIS DIPSTICK
Bilirubin, UA: NEGATIVE
Glucose, UA: NEGATIVE
KETONES UA: NEGATIVE
Leukocytes, UA: NEGATIVE
NITRITE UA: NEGATIVE
PROTEIN UA: NEGATIVE
SPEC GRAV UA: 1.02 (ref 1.010–1.025)
Urobilinogen, UA: 0.2 E.U./dL
pH, UA: 6 (ref 5.0–8.0)

## 2017-06-24 LAB — MAGNESIUM: Magnesium: 2 mg/dL (ref 1.5–2.5)

## 2017-06-24 MED ORDER — NITROFURANTOIN MONOHYD MACRO 100 MG PO CAPS: 100.0000 mg | ORAL_CAPSULE | Freq: Two times a day (BID) | ORAL | 0 refills | Status: DC

## 2017-06-24 NOTE — Progress Notes (Signed)
   Subjective:    Patient ID: [name], female    DOB: 1966-01-10, 52 y.o.   MRN: [id number]  HPI Here for 2 days of bilateral lower back pains and bilateral lower abdominal pains. No burning on urinations or visible blood. Her BMs are normal. No fever or nausea.    Review of Systems  Constitutional: Negative.   Respiratory: Negative.   Cardiovascular: Negative.   Gastrointestinal: Positive for abdominal pain. Negative for abdominal distention, anal bleeding, blood in stool, constipation, diarrhea, nausea, rectal pain and vomiting.  Genitourinary: Positive for pelvic pain. Negative for dysuria, hematuria and urgency.       Objective:   Physical Exam  Constitutional: She appears well-developed and well-nourished.  Cardiovascular: Normal rate, regular rhythm, normal heart sounds and intact distal pulses.  Pulmonary/Chest: Effort normal and breath sounds normal. No respiratory distress. She has no wheezes. She has no rales.  Abdominal: Soft. Bowel sounds are normal. She exhibits no distension and no mass. There is no tenderness. There is no rebound and no guarding.          Assessment & Plan:  Probable UTI. Treat with Macrobid. Drink plenty of water. Culture the sample.  Alysia Penna, MD

## 2017-06-24 NOTE — Addendum Note (Signed)
Addended by: Elmer Picker on: 06/24/2017 12:24 PM   Modules accepted: Orders

## 2017-06-24 NOTE — Addendum Note (Signed)
Addended by: Elmer Picker on: 06/24/2017 12:51 PM   Modules accepted: Orders

## 2017-06-24 NOTE — Addendum Note (Signed)
Addended by: Elmer Picker on: 06/24/2017 12:23 PM   Modules accepted: Orders

## 2017-06-24 NOTE — Addendum Note (Signed)
Addended by: Elmer Picker on: 06/24/2017 12:25 PM   Modules accepted: Orders

## 2017-06-25 LAB — URINE CULTURE
MICRO NUMBER: 90166852
SPECIMEN QUALITY:: ADEQUATE

## 2017-06-25 LAB — PREGNANCY, URINE: Preg Test, Ur: NEGATIVE

## 2017-06-29 ENCOUNTER — Telehealth: Payer: Self-pay | Admitting: Internal Medicine

## 2017-06-29 NOTE — Telephone Encounter (Signed)
All laboratory studies including pregnancy tests were normal or negative

## 2017-06-29 NOTE — Telephone Encounter (Signed)
Copied from [place]. Topic: Quick Communication - See Telephone Encounter >> Jun 29, 2017 12:01 PM Bea Graff, NT wrote: CRM for notification. See Telephone encounter for: Pt would like a call to go over her lab results from 06/24/17.  06/29/17.

## 2017-06-29 NOTE — Telephone Encounter (Signed)
Please advise 

## 2017-06-29 NOTE — Telephone Encounter (Signed)
Pt was called and made aware of lab results. Pt requested a copy of lab work to be sent via fax to Dr Pearletha Forge [phone] & [phone]).

## 2017-07-15 ENCOUNTER — Other Ambulatory Visit: Payer: Self-pay | Admitting: Internal Medicine

## 2017-07-19 ENCOUNTER — Telehealth: Payer: Self-pay | Admitting: Internal Medicine

## 2017-07-19 NOTE — Telephone Encounter (Signed)
Copied from [place] [id number] [id number]. Topic: Quick Communication - See Telephone Encounter >> Jul 19, 2017  2:57 PM Percell Belt A wrote: CRM for notification. See Telephone encounter for: [facility] called in and is request last lab (06/24/2016) for pt to be sent over to fax number- [phone]-  Attn Lincoln Maxin they need them asap  07/19/17.

## 2017-07-20 NOTE — Telephone Encounter (Signed)
Information faxed to [phone]. No further action needed.

## 2017-07-20 NOTE — Telephone Encounter (Signed)
Spoke to patient regarding the medication. Patient never scheduled a follow-up visit for itching. Patient scheduled an appointment and will follow-up with Dr.Kwiatkowski.

## 2017-07-25 NOTE — Telephone Encounter (Signed)
Noted  

## 2017-07-29 ENCOUNTER — Encounter: Payer: Self-pay | Admitting: Internal Medicine

## 2017-07-29 ENCOUNTER — Ambulatory Visit: Payer: BLUE CROSS/BLUE SHIELD | Admitting: Internal Medicine

## 2017-07-29 VITALS — BP 122/90 | HR 67 | Temp 98.4°F | Wt 255.8 lb

## 2017-07-29 DIAGNOSIS — I10 Essential (primary) hypertension: Secondary | ICD-10-CM | POA: Diagnosis not present

## 2017-07-29 DIAGNOSIS — D509 Iron deficiency anemia, unspecified: Secondary | ICD-10-CM | POA: Diagnosis not present

## 2017-07-29 DIAGNOSIS — E538 Deficiency of other specified B group vitamins: Secondary | ICD-10-CM | POA: Diagnosis not present

## 2017-07-29 DIAGNOSIS — R209 Unspecified disturbances of skin sensation: Secondary | ICD-10-CM | POA: Diagnosis not present

## 2017-07-29 DIAGNOSIS — G35 Multiple sclerosis: Secondary | ICD-10-CM

## 2017-07-29 LAB — COMPREHENSIVE METABOLIC PANEL
ALBUMIN: 4 g/dL (ref 3.5–5.2)
ALT: 11 U/L (ref 0–35)
AST: 10 U/L (ref 0–37)
Alkaline Phosphatase: 88 U/L (ref 39–117)
BUN: 10 mg/dL (ref 6–23)
CALCIUM: 9.4 mg/dL (ref 8.4–10.5)
CO2: 29 mEq/L (ref 19–32)
Chloride: 102 mEq/L (ref 96–112)
Creatinine, Ser: 0.91 mg/dL (ref 0.40–1.20)
GFR: 84.4 mL/min (ref >60.00)
Glucose, Bld: 81 mg/dL (ref 70–99)
POTASSIUM: 3.8 meq/L (ref 3.5–5.1)
Sodium: 137 mEq/L (ref 135–145)
Total Bilirubin: 0.4 mg/dL (ref 0.2–1.2)
Total Protein: 6.9 g/dL (ref 6.0–8.3)

## 2017-07-29 LAB — URINALYSIS, MICROSCOPIC ONLY: RBC / HPF: NONE SEEN (ref 0–?)

## 2017-07-29 LAB — CBC WITH DIFFERENTIAL/PLATELET
Basophils Absolute: 0 10*3/uL (ref 0.0–0.1)
Basophils Relative: 0.3 % (ref 0.0–3.0)
EOS PCT: 2 % (ref 0.0–5.0)
Eosinophils Absolute: 0.1 10*3/uL (ref 0.0–0.7)
HEMATOCRIT: 36.7 % (ref 36.0–46.0)
HEMOGLOBIN: 11.9 g/dL — AB (ref 12.0–15.0)
LYMPHS PCT: 14.7 % (ref 12.0–46.0)
Lymphs Abs: 0.9 10*3/uL (ref 0.7–4.0)
MCHC: 32.4 g/dL (ref 30.0–36.0)
MCV: 80.2 fl (ref 78.0–100.0)
MONO ABS: 0.5 10*3/uL (ref 0.1–1.0)
Monocytes Relative: 8.8 % (ref 3.0–12.0)
NEUTROS ABS: 4.3 10*3/uL (ref 1.4–7.7)
Neutrophils Relative %: 74.2 % (ref 43.0–77.0)
PLATELETS: 241 10*3/uL (ref 150.0–400.0)
RBC: 4.57 Mil/uL (ref 3.87–5.11)
RDW: 16.3 % — ABNORMAL HIGH (ref 11.5–15.5)
WBC: 5.8 10*3/uL (ref 4.0–10.5)

## 2017-07-29 LAB — POC URINALSYSI DIPSTICK (AUTOMATED)
Bilirubin, UA: NEGATIVE
Blood, UA: NEGATIVE
Glucose, UA: NEGATIVE
KETONES UA: NEGATIVE
LEUKOCYTES UA: NEGATIVE
Nitrite, UA: NEGATIVE
PH UA: 6.5 (ref 5.0–8.0)
PROTEIN UA: NEGATIVE
Spec Grav, UA: 1.02 (ref 1.010–1.025)
UROBILINOGEN UA: 0.2 U/dL

## 2017-07-29 LAB — MAGNESIUM: Magnesium: 1.9 mg/dL (ref 1.5–2.5)

## 2017-07-29 MED ORDER — CYANOCOBALAMIN 1000 MCG/ML IJ SOLN
1000.0000 ug | Freq: Once | INTRAMUSCULAR | Status: AC
Start: 2017-07-29 — End: 2017-07-29
  Administered 2017-07-29: 1000 ug via INTRAMUSCULAR

## 2017-07-29 MED ORDER — HYDROXYZINE HCL 25 MG PO TABS: 25.0000 mg | ORAL_TABLET | Freq: Three times a day (TID) | ORAL | 4 refills | Status: DC | PRN

## 2017-07-29 NOTE — Progress Notes (Signed)
Subjective:    Patient ID: [name], female    DOB: 1965/07/25, 52 y.o.   MRN: [id number]  HPI 52 year old patient who is seen today in follow-up.  She is followed closely by neurology with a history of multiple sclerosis.  She is seen today for screening lab draw as well as a B12 injection. Otherwise doing quite well.  She does have essential hypertension and obesity.  No new concerns or complaints.  She does have considerable pruritus which she attributes to treatment of the MS and is requesting a refill on hydroxyzine  Past Medical History:  Diagnosis Date  . Anemia   . GERD 12/30/2006  . Headache(784.0)   . Hypertension   . Multiple sclerosis ([place])   . PARESTHESIA 12/30/2006  . Stroke [facility])      Social History   Socioeconomic History  . Marital status: Married    Spouse name: [name]  . Number of children: 0  . Years of education: College  . Highest education level: Not on file  Social Needs  . Financial resource strain: Not on file  . Food insecurity - worry: Not on file  . Food insecurity - inability: Not on file  . Transportation needs - medical: Not on file  . Transportation needs - non-medical: Not on file  Occupational History    Employer: [employer]  . Occupation: RESEARCH ANALYST    Employer: [employer]  Tobacco Use  . Smoking status: Never Smoker  . Smokeless tobacco: Never Used  Substance and Sexual Activity  . Alcohol use: No  . Drug use: No  . Sexual activity: Not on file  Other Topics Concern  . Not on file  Social History Narrative   Pt lives at home family.   Caffeine Use: 2 sodas daily    Past Surgical History:  Procedure Laterality Date  . ANAL INTRAEPITHELIAL NEOPLASIA EXCISION    . BREAST BIOPSY    . CESAREAN SECTION    . Myometomy    . TONSILLECTOMY      Family History  Problem Relation Age of Onset  . Stroke Father   . Psychosis Sister   . Hypertension Unknown   . Diabetes Unknown   . Dementia Unknown      Allergies  Allergen Reactions  . Banana Swelling    Lips, tongue and face  . Sulfacetamide Sodium Other (See Comments)    Syncope episode and was incoherent  . Penicillins Diarrhea and Nausea And Vomiting    Has patient had a PCN reaction causing immediate rash, facial/tongue/throat swelling, SOB or lightheadedness with hypotension: Yes Has patient had a PCN reaction causing severe rash involving mucus membranes or skin necrosis: No Has patient had a PCN reaction that required hospitalization No Has patient had a PCN reaction occurring within the last 10 years: No If all of the above answers are "NO", then may proceed with Cephalosporin use.     Current Outpatient Medications on File Prior to Visit  Medication Sig Dispense Refill  . acetaminophen (TYLENOL) 160 MG/5ML solution Take 320 mg by mouth 2 (two) times daily as needed for mild pain. Reported on 07/21/2015    . acyclovir (ZOVIRAX) 200 MG capsule Take 200mg  (1 tablet) by mouth twice daily until physician discontinues medication. Start on first day of Lemtrada infusion.    . Alemtuzumab (LEMTRADA IV) Inject into the vein 5 days. Every September ( yearly )    . cetirizine (ZYRTEC) 10 MG tablet Take 10  mg by mouth at bedtime.    . cholecalciferol (VITAMIN D) 1000 units tablet Take 1,000 Units by mouth daily.    . citalopram (CELEXA) 20 MG tablet Take 20 mg by mouth daily.     . cyanocobalamin (,VITAMIN B-12,) 1000 MCG/ML injection Inject 1 mL (1,000 mcg total) into the muscle every 30 (thirty) days. 10 mL 1  . cyclobenzaprine (FLEXERIL) 10 MG tablet Take 10 mg by mouth 2 (two) times daily as needed for muscle spasms.     . diphenhydrAMINE (BENADRYL) 12.5 MG/5ML elixir Take 25 mg by mouth 4 (four) times daily as needed for allergies.    . famotidine (PEPCID) 40 MG/5ML suspension Take 40 mg by mouth at bedtime.     . furosemide (LASIX) 40 MG tablet Take 1 tablet (40 mg total) by mouth daily as needed for fluid. 60 tablet 2  .  ibuprofen (ADVIL,MOTRIN) 200 MG tablet Take 200 mg by mouth every 8 (eight) hours as needed.    . levETIRAcetam (KEPPRA) 100 MG/ML solution Take 500 mg by mouth 2 (two) times daily.  3  . Oxcarbazepine (TRILEPTAL) 300 MG tablet Take 300 mg by mouth 2 (two) times daily.     . tizanidine (ZANAFLEX) 2 MG capsule Take 2 mg by mouth 2 (two) times daily.     Marland Kitchen triamcinolone cream (KENALOG) 0.1 % Apply 1 application topically 2 (two) times daily. 30 g 0   Current Facility-Administered Medications on File Prior to Visit  Medication Dose Route Frequency Provider Last Rate Last Dose  . cyanocobalamin ((VITAMIN B-12)) injection 1,000 mcg  1,000 mcg Intramuscular Once Marletta Lor, MD        BP 122/90   Pulse 67   Temp 98.4 F (36.9 C) (Oral)   Wt 255 lb 12.8 oz (116 kg)   SpO2 96%   BMI 45.31 kg/m      Review of Systems  HENT: Negative for congestion, dental problem, hearing loss, rhinorrhea, sinus pressure, sore throat and tinnitus.   Eyes: Negative for pain, discharge and visual disturbance.  Respiratory: Negative for cough and shortness of breath.   Cardiovascular: Negative for chest pain, palpitations and leg swelling.  Gastrointestinal: Negative for abdominal distention, abdominal pain, blood in stool, constipation, diarrhea, nausea and vomiting.  Genitourinary: Negative for difficulty urinating, dysuria, flank pain, frequency, hematuria, pelvic pain, urgency, vaginal bleeding, vaginal discharge and vaginal pain.  Musculoskeletal: Negative for arthralgias, gait problem and joint swelling.  Skin: Negative for rash.       Generalized pruritus  Neurological: Positive for weakness. Negative for dizziness, syncope, speech difficulty, numbness and headaches.  Hematological: Negative for adenopathy.  Psychiatric/Behavioral: Negative for agitation, behavioral problems and dysphoric mood. The patient is not nervous/anxious.        Objective:   Physical Exam  Constitutional: She is  oriented to person, place, and time. She appears well-developed and well-nourished.  Weight 255 Blood pressure 122/90  HENT:  Head: Normocephalic.  Right Ear: External ear normal.  Left Ear: External ear normal.  Mouth/Throat: Oropharynx is clear and moist.  Eyes: Conjunctivae and EOM are normal. Pupils are equal, round, and reactive to light.  Neck: Normal range of motion. Neck supple. No thyromegaly present.  Cardiovascular: Normal rate, regular rhythm, normal heart sounds and intact distal pulses.  Pulmonary/Chest: Effort normal and breath sounds normal.  Abdominal: Soft. Bowel sounds are normal. She exhibits no mass. There is no tenderness.  Musculoskeletal: Normal range of motion.  Lymphadenopathy:    She  has no cervical adenopathy.  Neurological: She is alert and oriented to person, place, and time.  Wheelchair-bound  Skin: Skin is warm and dry. No rash noted.  Psychiatric: She has a normal mood and affect. Her behavior is normal.          Assessment & Plan:   Multiple sclerosis.  Will check screening lab per neurology request Generalized pruritus.  Patient is on a regimen of sertraline as well as H2 blocker therapy will refill hydroxyzine Essential hypertension stable  exogenous obesity.  Nyoka Cowden

## 2017-07-29 NOTE — Patient Instructions (Signed)
Neurology follow-up as scheduled  Return in 6 months for follow-up

## 2017-07-30 LAB — T-HELPER CELLS (CD4) COUNT (NOT AT ARMC)
ABSOLUTE CD4: 87 {cells}/uL — AB (ref 490–1740)
CD4 T HELPER %: 8 % — AB (ref 30–61)
TOTAL LYMPHOCYTE COUNT: 1077 {cells}/uL (ref 850–3900)

## 2017-07-30 LAB — PREGNANCY, URINE: PREG TEST UR: NEGATIVE

## 2017-08-03 ENCOUNTER — Telehealth: Payer: Self-pay | Admitting: Family Medicine

## 2017-08-03 NOTE — Telephone Encounter (Signed)
Lab results from 07/29/17 were Faxed to pt's Nuro MD

## 2017-08-03 NOTE — Telephone Encounter (Signed)
Copied from [place] [id number]. Topic: Medical Record Request - Patient ROI Request >> Aug 03, 2017  9:59 AM Bea Graff, NT wrote: Patient Name/DOB/MRN #: Dymin, Dingledine [id number]/ Jun 15, 1965 Requestor Name/Agency: self Call Back #: [phone] Information Requested: Requesting labs to be faxed to Dr. Vilma Meckel Neurologist. Fax#: [phone] or [phone]   Route to [place] for [place] clinics. For all other clinics, route to the clinic's PEC Pool.

## 2017-08-06 ENCOUNTER — Ambulatory Visit: Payer: BLUE CROSS/BLUE SHIELD | Admitting: Family Medicine

## 2017-08-06 ENCOUNTER — Encounter: Payer: Self-pay | Admitting: Family Medicine

## 2017-08-06 VITALS — BP 134/98 | HR 70 | Temp 98.6°F | Ht 63.0 in | Wt 258.0 lb

## 2017-08-06 DIAGNOSIS — J019 Acute sinusitis, unspecified: Secondary | ICD-10-CM | POA: Diagnosis not present

## 2017-08-06 DIAGNOSIS — R6889 Other general symptoms and signs: Secondary | ICD-10-CM | POA: Diagnosis not present

## 2017-08-06 LAB — POC INFLUENZA A&B (BINAX/QUICKVUE)
Influenza A, POC: NEGATIVE
Influenza B, POC: NEGATIVE

## 2017-08-06 MED ORDER — AZITHROMYCIN 250 MG PO TABS: ORAL_TABLET | ORAL | 0 refills | Status: DC

## 2017-08-06 MED ORDER — HYDROCODONE-HOMATROPINE 5-1.5 MG/5ML PO SYRP: 5.0000 mL | ORAL_SOLUTION | ORAL | 0 refills | Status: DC | PRN

## 2017-08-06 NOTE — Progress Notes (Signed)
   Subjective:    Patient ID: [name], female    DOB: [birth year], 52 y.o.   MRN: [id number]  HPI Here for one week of body aches, sinus pressure, PND, ST, and a dry cough. The cough is so hard it gags her. No fever. On Tylenol and Benadryl.    Review of Systems  Constitutional: Negative.   HENT: Positive for congestion, postnasal drip, sinus pressure, sinus pain and sore throat.   Eyes: Negative.   Respiratory: Positive for cough and chest tightness. Negative for shortness of breath.   Cardiovascular: Positive for chest pain. Negative for palpitations and leg swelling.  Gastrointestinal: Positive for nausea and vomiting. Negative for abdominal distention, abdominal pain, constipation and diarrhea.       Objective:   Physical Exam  Constitutional: She appears well-developed and well-nourished.  HENT:  Right Ear: External ear normal.  Left Ear: External ear normal.  Nose: Nose normal.  Mouth/Throat: Oropharynx is clear and moist.  Eyes: Conjunctivae are normal.  Neck: No thyromegaly present.  Pulmonary/Chest: Effort normal and breath sounds normal. No respiratory distress. She has no wheezes. She has no rales.  Abdominal: Soft. Bowel sounds are normal. She exhibits no distension and no mass. There is no tenderness. There is no rebound and no guarding.  Lymphadenopathy:    She has no cervical adenopathy.          Assessment & Plan:  Sinusitis secondary to a viral illness. Treat with a Zpack.  Alysia Penna, MD

## 2017-08-18 ENCOUNTER — Encounter: Payer: Self-pay | Admitting: Internal Medicine

## 2017-08-18 ENCOUNTER — Telehealth: Payer: Self-pay | Admitting: Internal Medicine

## 2017-08-18 ENCOUNTER — Ambulatory Visit: Payer: BLUE CROSS/BLUE SHIELD | Admitting: Internal Medicine

## 2017-08-18 VITALS — BP 140/100 | HR 79 | Temp 99.0°F | Wt 258.0 lb

## 2017-08-18 DIAGNOSIS — I10 Essential (primary) hypertension: Secondary | ICD-10-CM | POA: Diagnosis not present

## 2017-08-18 DIAGNOSIS — B309 Viral conjunctivitis, unspecified: Secondary | ICD-10-CM

## 2017-08-18 MED ORDER — NEOMYCIN-POLYMYXIN-HC 3.5-10000-1 OP SUSP: 2.0000 [drp] | OPHTHALMIC | 0 refills | Status: DC

## 2017-08-18 NOTE — Telephone Encounter (Signed)
Spoke to patient giving the update. Will call pharmacy for more information and alternatives.

## 2017-08-18 NOTE — Telephone Encounter (Signed)
Copied from [place] [id number]. Topic: Quick Communication - Rx Refill/Question >> Aug 18, 2017 11:17 AM Oliver Pila B wrote: Pharmacist called and states the medication is not in stock, call pharmacy w/ an alternate option  Medication: neomycin-polymyxin-hydrocortisone (CORTISPORIN) 3.5-10000-1 ophthalmic suspension [[id number]]

## 2017-08-18 NOTE — Telephone Encounter (Signed)
Called pharmacy and they stated that the issue was resolved. No further action needed.

## 2017-08-18 NOTE — Patient Instructions (Addendum)
Ophthalmic eyedrops as dispensed  Call or return to clinic prn if these symptoms worsen or fail to improve as anticipated.   Viral Conjunctivitis, Adult Viral conjunctivitis is an inflammation of the clear membrane that covers the white part of your eye and the inner surface of your eyelid (conjunctiva). The inflammation is caused by a viral infection. The blood vessels in the conjunctiva become inflamed, causing the eye to become red or pink, and often itchy. Viral conjunctivitis can be easily passed from one person to another (is contagious). This condition is often called pink eye. What are the causes? This condition is caused by a virus. A virus is a type of contagious germ. It can be spread by touching objects that have been contaminated with the virus, such as doorknobs or towels. It can also be passed through droplets, such as from coughing or sneezing. What are the signs or symptoms? Symptoms of this condition include:  Eye redness.  Tearing or watery eyes.  Itchy and irritated eyes.  Burning feeling in the eyes.  Clear drainage from the eye.  Swollen eyelids.  A gritty feeling in the eye.  Light sensitivity.  This condition often occurs with other symptoms, such as a fever, nausea, or a rash. How is this diagnosed? This condition is diagnosed with a medical history and physical exam. If you have discharge from your eye, the discharge may be tested to rule out other causes of conjunctivitis. How is this treated? Viral conjunctivitis does not respond to medicines that kill bacteria (antibiotics). Treatment for viral conjunctivitis is directed at stopping a bacterial infection from developing in addition to the viral infection. Treatment also aims to relieve your symptoms, such as itching. This may be done with antihistamine drops or other eye medicines. Rarely, steroid eye drops or antiviral medicines may be prescribed. Follow these instructions at home: Medicines   Take  or apply over-the-counter and prescription medicines only as told by your health care provider.  Be very careful to avoid touching the edge of the eyelid with the eye drop bottle or ointment tube when applying medicines to the affected eye. Being careful this way will stop you from spreading the infection to the other eye or to other people. Eye care  Avoid touching or rubbing your eyes.  Apply a warm, wet, clean washcloth to your eye for 10-20 minutes, 3-4 times per day or as told by your health care provider.  If you wear contact lenses, do not wear them until the inflammation is gone and your health care provider says it is safe to wear them again. Ask your health care provider how to sterilize or replace your contact lenses before using them again. Wear glasses until you can resume wearing contacts.  Avoid wearing eye makeup until the inflammation is gone. Throw away any old eye cosmetics that may be contaminated.  Gently wipe away any drainage from your eye with a warm, wet washcloth or a cotton ball. General instructions  Change or wash your pillowcase every day or as told by your health care provider.  Do not share towels, pillowcases, washcloths, eye makeup, makeup brushes, contact lenses, or glasses. This may spread the infection.  Wash your hands often with soap and water. Use paper towels to dry your hands. If soap and water are not available, use hand sanitizer.  Try to avoid contact with other people for one week or as told by your health care provider. Contact a health care provider if:  Your symptoms  do not improve with treatment or they get worse.  You have increased pain.  Your vision becomes blurry.  You have a fever.  You have facial pain, redness, or swelling.  You have yellow or green drainage coming from your eye.  You have new symptoms. This information is not intended to replace advice given to you by your health care provider. Make sure you discuss any  questions you have with your health care provider. Document Released: 07/25/2002 Document Revised: 11/30/2015 Document Reviewed: 11/19/2015 Elsevier Interactive Patient Education  [employer].

## 2017-08-18 NOTE — Progress Notes (Signed)
Subjective:    Patient ID: [name], female    DOB: 05-29-1965, 52 y.o.   MRN: [id number]  HPI 52 year old patient who presents with a 1 day history of erythema itchiness and increased lacrimation from both eyes the left much more involved than the right No change in visual acuity.  Eyes described as scratchy but no significant pain. She has tried no OTC or topical medications  Past Medical History:  Diagnosis Date  . Anemia   . GERD 12/30/2006  . Headache(784.0)   . Hypertension   . Multiple sclerosis ([place])   . PARESTHESIA 12/30/2006  . Stroke [facility])      Social History   Socioeconomic History  . Marital status: Married    Spouse name: [name]  . Number of children: 0  . Years of education: College  . Highest education level: Not on file  Occupational History    Employer: [employer]  . Occupation: RESEARCH ANALYST    Employer: [employer]  Social Needs  . Financial resource strain: Not on file  . Food insecurity:    Worry: Not on file    Inability: Not on file  . Transportation needs:    Medical: Not on file    Non-medical: Not on file  Tobacco Use  . Smoking status: Never Smoker  . Smokeless tobacco: Never Used  Substance and Sexual Activity  . Alcohol use: No  . Drug use: No  . Sexual activity: Not on file  Lifestyle  . Physical activity:    Days per week: Not on file    Minutes per session: Not on file  . Stress: Not on file  Relationships  . Social connections:    Talks on phone: Not on file    Gets together: Not on file    Attends religious service: Not on file    Active member of club or organization: Not on file    Attends meetings of clubs or organizations: Not on file    Relationship status: Not on file  . Intimate partner violence:    Fear of current or ex partner: Not on file    Emotionally abused: Not on file    Physically abused: Not on file    Forced sexual activity: Not on file  Other Topics Concern  . Not on file    Social History Narrative   Pt lives at home family.   Caffeine Use: 2 sodas daily    Past Surgical History:  Procedure Laterality Date  . ANAL INTRAEPITHELIAL NEOPLASIA EXCISION    . BREAST BIOPSY    . CESAREAN SECTION    . Myometomy    . TONSILLECTOMY      Family History  Problem Relation Age of Onset  . Stroke Father   . Psychosis Sister   . Hypertension Unknown   . Diabetes Unknown   . Dementia Unknown     Allergies  Allergen Reactions  . Banana Swelling    Lips, tongue and face  . Sulfacetamide Sodium Other (See Comments)    Syncope episode and was incoherent  . Penicillins Diarrhea and Nausea And Vomiting    Has patient had a PCN reaction causing immediate rash, facial/tongue/throat swelling, SOB or lightheadedness with hypotension: Yes Has patient had a PCN reaction causing severe rash involving mucus membranes or skin necrosis: No Has patient had a PCN reaction that required hospitalization No Has patient had a PCN reaction occurring within the last 10 years: No If all  of the above answers are "NO", then may proceed with Cephalosporin use.     Current Outpatient Medications on File Prior to Visit  Medication Sig Dispense Refill  . acetaminophen (TYLENOL) 160 MG/5ML solution Take 320 mg by mouth 2 (two) times daily as needed for mild pain. Reported on 07/21/2015    . acyclovir (ZOVIRAX) 200 MG capsule Take 200mg  (1 tablet) by mouth twice daily until physician discontinues medication. Start on first day of Lemtrada infusion.    . Alemtuzumab (LEMTRADA IV) Inject into the vein 5 days. Every September ( yearly )    . azithromycin (ZITHROMAX Z-PAK) 250 MG tablet As directed 6 each 0  . cetirizine (ZYRTEC) 10 MG tablet Take 10 mg by mouth at bedtime.    . cholecalciferol (VITAMIN D) 1000 units tablet Take 1,000 Units by mouth daily.    . citalopram (CELEXA) 20 MG tablet Take 20 mg by mouth daily.     . cyanocobalamin (,VITAMIN B-12,) 1000 MCG/ML injection Inject 1 mL  (1,000 mcg total) into the muscle every 30 (thirty) days. 10 mL 1  . cyclobenzaprine (FLEXERIL) 10 MG tablet Take 10 mg by mouth 2 (two) times daily as needed for muscle spasms.     . diphenhydrAMINE (BENADRYL) 12.5 MG/5ML elixir Take 25 mg by mouth 4 (four) times daily as needed for allergies.    . famotidine (PEPCID) 40 MG/5ML suspension Take 40 mg by mouth at bedtime.     . furosemide (LASIX) 40 MG tablet Take 1 tablet (40 mg total) by mouth daily as needed for fluid. 60 tablet 2  . HYDROcodone-homatropine (HYDROMET) 5-1.5 MG/5ML syrup Take 5 mLs by mouth every 4 (four) hours as needed. 240 mL 0  . hydrOXYzine (ATARAX/VISTARIL) 25 MG tablet Take 1 tablet (25 mg total) by mouth 3 (three) times daily as needed for itching. 60 tablet 4  . ibuprofen (ADVIL,MOTRIN) 200 MG tablet Take 200 mg by mouth every 8 (eight) hours as needed.    . levETIRAcetam (KEPPRA) 100 MG/ML solution Take 500 mg by mouth 2 (two) times daily.  3  . Oxcarbazepine (TRILEPTAL) 300 MG tablet Take 300 mg by mouth 2 (two) times daily.     . tizanidine (ZANAFLEX) 2 MG capsule Take 2 mg by mouth 2 (two) times daily.     Marland Kitchen triamcinolone cream (KENALOG) 0.1 % Apply 1 application topically 2 (two) times daily. 30 g 0   Current Facility-Administered Medications on File Prior to Visit  Medication Dose Route Frequency Provider Last Rate Last Dose  . cyanocobalamin ((VITAMIN B-12)) injection 1,000 mcg  1,000 mcg Intramuscular Once Marletta Lor, MD        BP (!) 140/100 (BP Location: Right Arm, Patient Position: Sitting, Cuff Size: Large)   Pulse 79   Temp 99 F (37.2 C) (Oral)   Wt 258 lb (117 kg)   SpO2 96%   BMI 45.70 kg/m      Review of Systems  Eyes: Positive for discharge, redness and itching. Negative for photophobia, pain and visual disturbance.       Objective:   Physical Exam  Constitutional: She appears well-developed and well-nourished. No distress.  Eyes:  Bilateral conjunctival injection No  exudate  Visual acuity normal          Assessment & Plan:  Bilateral conjunctivitis left greater than right Immunosuppression Sulfa and penicillin allergy  We will treat with Cortisporin ophthalmic drops  Nyoka Cowden

## 2017-08-25 ENCOUNTER — Telehealth: Payer: Self-pay | Admitting: Internal Medicine

## 2017-08-25 NOTE — Telephone Encounter (Signed)
Copied from [place] [phone]. Topic: Quick Communication - See Telephone Encounter >> Aug 25, 2017  3:53 PM Boyd Kerbs wrote: CRM for notification.   Tommi Rumps from [facility].  [phone] asking about cell count CD4.  Is needing to clarify this is not HIV.  Please call back.   See Telephone encounter for: 08/25/17.

## 2017-08-26 NOTE — Telephone Encounter (Signed)
Please call cory from the health dept back @ [phone].

## 2017-08-26 NOTE — Telephone Encounter (Signed)
[name] was informed that pt is not HIV positive.

## 2017-09-03 ENCOUNTER — Encounter (HOSPITAL_COMMUNITY): Payer: Self-pay | Admitting: Emergency Medicine

## 2017-09-03 ENCOUNTER — Emergency Department (HOSPITAL_COMMUNITY)
Admission: EM | Admit: 2017-09-03 | Discharge: 2017-09-03 | Disposition: A | Discharge status: Home / Self Care | Payer: BLUE CROSS/BLUE SHIELD | Attending: Emergency Medicine | Admitting: Emergency Medicine

## 2017-09-03 DIAGNOSIS — H209 Unspecified iridocyclitis: Secondary | ICD-10-CM

## 2017-09-03 DIAGNOSIS — Z8673 Personal history of transient ischemic attack (TIA), and cerebral infarction without residual deficits: Secondary | ICD-10-CM | POA: Diagnosis not present

## 2017-09-03 DIAGNOSIS — Z79899 Other long term (current) drug therapy: Secondary | ICD-10-CM | POA: Insufficient documentation

## 2017-09-03 DIAGNOSIS — H2000 Unspecified acute and subacute iridocyclitis: Secondary | ICD-10-CM | POA: Diagnosis not present

## 2017-09-03 DIAGNOSIS — G35 Multiple sclerosis: Secondary | ICD-10-CM | POA: Insufficient documentation

## 2017-09-03 DIAGNOSIS — I10 Essential (primary) hypertension: Secondary | ICD-10-CM | POA: Insufficient documentation

## 2017-09-03 DIAGNOSIS — H5711 Ocular pain, right eye: Secondary | ICD-10-CM | POA: Diagnosis present

## 2017-09-03 MED ORDER — TETRACAINE HCL 0.5 % OP SOLN
2.0000 [drp] | Freq: Once | OPHTHALMIC | Status: AC
  Administered 2017-09-03: 2 [drp] via OPHTHALMIC
  Filled 2017-09-03: qty 4

## 2017-09-03 MED ORDER — HYPROMELLOSE (GONIOSCOPIC) 2.5 % OP SOLN: 1.0000 [drp] | Freq: Four times a day (QID) | OPHTHALMIC | 12 refills | Status: DC | PRN

## 2017-09-03 MED ORDER — PREDNISOLONE ACETATE 1 % OP SUSP
1.0000 [drp] | Freq: Four times a day (QID) | OPHTHALMIC | Status: DC
Start: 2017-09-03 — End: 2017-09-04
  Administered 2017-09-03: 1 [drp] via OPHTHALMIC
  Filled 2017-09-03: qty 1

## 2017-09-03 MED ORDER — FLUORESCEIN SODIUM 1 MG OP STRP
1.0000 | ORAL_STRIP | Freq: Once | OPHTHALMIC | Status: AC
  Administered 2017-09-03: 1 via OPHTHALMIC
  Filled 2017-09-03: qty 1

## 2017-09-03 NOTE — Discharge Instructions (Signed)
Your exam is consistent with iritis. Use prednisolone drops every 6 hours (four times per day) until follow up on Monday. Follow up with Dr. Manuella Ghazi at [facility] Monday morning. Use artifical tear drops for comfort. If you develop worsening or new concerning symptoms you can return to the emergency department for re-evaluation.

## 2017-09-03 NOTE — ED Provider Notes (Signed)
Complains of bilateral eye pain and redness onset yesterday afternoon.  She also complains of numbness in her tongue and around her lips since waiting in the waiting room here.  She saw her neurologist yesterday.  She has MRI scans ordered from 2 weeks from now.   Orlie Dakin, MD 09/03/17 2300

## 2017-09-03 NOTE — ED Provider Notes (Signed)
[facility] EMERGENCY DEPARTMENT Provider Note   CSN: [id number] Arrival date & time: 09/03/17  1341     History   Chief Complaint Chief Complaint  Patient presents with  . Eye Problem    HPI [name] is a 52 y.o. female with a history of headache, hypertension, MS who presents the emergency department today for bilateral eye pain.  Patient was sent over from PCP today for eye pain.  She states that she was at her MS specialist at [facility] yesterday and was asymptomatic.  When she arrived home yesterday she started having severe eye pain that was worse on the right side as well as associated photophobia of both eyes.  She notes that she has had redness of both eyes with clear discharge.  Her eyes have been itchy but she denies scratching or trauma to the eye.  Patient notes that 2 weeks ago she was diagnosed with pinkeye and had eyedrops that she used to completion.  She denies any sick contacts.  She notes some blurriness of bilateral vision secondary to pain, discharge and photophobia.  She denies any fever, nausea/vomiting/loss of vision, flashes/floaters, diplopia, foreign body sensation, trauma, sick contacts, rash, pain or painful extract movements. Patient does not wear contacts.   HPI  Past Medical History:  Diagnosis Date  . Anemia   . GERD 12/30/2006  . Headache(784.0)   . Hypertension   . Multiple sclerosis ([place])   . PARESTHESIA 12/30/2006  . Stroke [facility])     Patient Active Problem List   Diagnosis Date Noted  . Essential hypertension 05/30/2015  . Right-sided chest pain 06/27/2014  . Microcytic anemia 06/27/2014  . Multiple sclerosis exacerbation ([place]) 06/27/2014  . Obesity (BMI 30-39.9) 06/27/2014  . B12 deficiency 06/06/2013  . Multiple sclerosis ([place]) 08/12/2010  . GERD 12/30/2006  . PARESTHESIA 12/30/2006    Past Surgical History:  Procedure Laterality Date  . ANAL INTRAEPITHELIAL NEOPLASIA EXCISION    . BREAST BIOPSY    .  CESAREAN SECTION    . Myometomy    . TONSILLECTOMY       OB History   None      Home Medications    Prior to Admission medications   Medication Sig Start Date End Date Taking? Authorizing Provider  acetaminophen (TYLENOL) 160 MG/5ML solution Take 320 mg by mouth 2 (two) times daily as needed for mild pain. Reported on 07/21/2015    [provider]  acyclovir (ZOVIRAX) 200 MG capsule Take 200mg  (1 tablet) by mouth twice daily until physician discontinues medication. Start on first day of Lemtrada infusion. 11/28/15   [provider]  Alemtuzumab (LEMTRADA IV) Inject into the vein 5 days. Every September ( yearly )    [provider]  azithromycin (ZITHROMAX Z-PAK) 250 MG tablet As directed 08/06/17   Laurey Morale, MD  cetirizine (ZYRTEC) 10 MG tablet Take 10 mg by mouth at bedtime.    [provider]  cholecalciferol (VITAMIN D) 1000 units tablet Take 1,000 Units by mouth daily.    [provider]  citalopram (CELEXA) 20 MG tablet Take 20 mg by mouth daily.  07/09/16   [provider]  cyanocobalamin (,VITAMIN B-12,) 1000 MCG/ML injection Inject 1 mL (1,000 mcg total) into the muscle every 30 (thirty) days. 06/06/13   Marletta Lor, MD  cyclobenzaprine (FLEXERIL) 10 MG tablet Take 10 mg by mouth 2 (two) times daily as needed for muscle spasms.     [provider]  diphenhydrAMINE (BENADRYL) 12.5 MG/5ML elixir Take 25 mg by mouth 4 (four) times daily as needed for allergies.    [provider]  famotidine (PEPCID) 40 MG/5ML suspension Take 40 mg by mouth at bedtime.  07/09/16   [provider]  furosemide (LASIX) 40 MG tablet Take 1 tablet (40 mg total) by mouth daily as needed for fluid. 07/16/16   Marletta Lor, MD  HYDROcodone-homatropine (HYDROMET) 5-1.5 MG/5ML syrup Take 5 mLs by mouth every 4 (four) hours as needed. 08/06/17   Laurey Morale, MD  hydrOXYzine (ATARAX/VISTARIL) 25 MG tablet Take 1  tablet (25 mg total) by mouth 3 (three) times daily as needed for itching. 07/29/17   Marletta Lor, MD  ibuprofen (ADVIL,MOTRIN) 200 MG tablet Take 200 mg by mouth every 8 (eight) hours as needed.    [provider]  levETIRAcetam (KEPPRA) 100 MG/ML solution Take 500 mg by mouth 2 (two) times daily. 09/21/16   [provider]  neomycin-polymyxin-hydrocortisone (CORTISPORIN) 3.5-10000-1 ophthalmic suspension Place 2 drops into both eyes every 4 (four) hours. 08/18/17   Marletta Lor, MD  Oxcarbazepine (TRILEPTAL) 300 MG tablet Take 300 mg by mouth 2 (two) times daily.  06/11/16   [provider]  tizanidine (ZANAFLEX) 2 MG capsule Take 2 mg by mouth 2 (two) times daily.  07/09/16   [provider]  triamcinolone cream (KENALOG) 0.1 % Apply 1 application topically 2 (two) times daily. 01/12/17   Marletta Lor, MD    Family History Family History  Problem Relation Age of Onset  . Stroke Father   . Psychosis Sister   . Hypertension Unknown   . Diabetes Unknown   . Dementia Unknown     Social History Social History   Tobacco Use  . Smoking status: Never Smoker  . Smokeless tobacco: Never Used  Substance Use Topics  . Alcohol use: No  . Drug use: No     Allergies   Banana; Sulfacetamide sodium; and Penicillins   Review of Systems Review of Systems  All other systems reviewed and are negative.    Physical Exam Updated Vital Signs BP 131/85   Pulse 79   Temp 99.1 F (37.3 C) (Oral)   Resp 18   Ht 5\' 3"  (1.6 m)   Wt 113.4 kg (250 lb)   SpO2 97%   BMI 44.29 kg/m   Physical Exam  Constitutional: She appears well-developed and well-nourished.  HENT:  Head: Normocephalic and atraumatic.  Right Ear: External ear normal.  Left Ear: External ear normal.  Nose: Nose normal.  Mouth/Throat: Uvula is midline, oropharynx is clear and moist and mucous membranes are normal. No tonsillar exudate.  No pre-orbital erythema or  edema.   Eyes: Pupils are equal, round, and reactive to light. Right eye exhibits no discharge. Left eye exhibits no discharge. No scleral icterus.  Slit lamp exam:      The right eye shows no corneal flare, no corneal ulcer, no foreign body, no hyphema and no hypopyon.       The left eye shows no corneal abrasion, no corneal flare, no corneal ulcer, no foreign body, no hyphema, no hypopyon, no fluorescein uptake and no anterior chamber bulge.  Bilateral conjunctival and sclera injection that is limbic sparing. PEERL intact.  No afferent pupillary defect.  EOMI without nystagmus. Photophobia and consensual photophobia.  Corneal Abrasion Exam VCO. Risks, benefits and alternatives explained. 2 drops of tetracaine (PONTOCAINE) 0.5 % ophthalmic solution  were  applied to the both eye. Fluorescein 1 MG ophthalmic strip applied the the surface of the both eye Wood's lamp used to screen for abrasion. No increased fluorescein uptake. No corneal ulcer. Negative Seidel sign. No foreign bodies noted. No visible hyphema.  Eye flushed with sterile saline Patient tolerated the procedure well TONOPEN: 20.95 LEFT, 20.90 RIGHT  Neck: Trachea normal. Neck supple. No spinous process tenderness present. No neck rigidity. Normal range of motion present.  Cardiovascular: Normal rate, regular rhythm and intact distal pulses.  No murmur heard. Pulses:      Radial pulses are 2+ on the right side, and 2+ on the left side.       Dorsalis pedis pulses are 2+ on the right side, and 2+ on the left side.       Posterior tibial pulses are 2+ on the right side, and 2+ on the left side.  No lower extremity swelling or edema. Calves symmetric in size bilaterally.  Pulmonary/Chest: Effort normal and breath sounds normal. She exhibits no tenderness.  Abdominal: Soft. Bowel sounds are normal. There is no tenderness. There is no rebound and no guarding.  Musculoskeletal: She exhibits no edema.  Lymphadenopathy:    She has no  cervical adenopathy.  Neurological: She is alert.  Speech clear. Follows commands. No facial droop. PERRLA. EOMI. Normal peripheral fields. CN III-XII intact.  Grossly moves all extremities 4 without ataxia. Coordination intact. Able and appropriate strength for age to upper and lower extremities bilaterally including grip strength. Sensation to light touch intact bilaterally for upper and lower. Patellar deep tendon reflex 2+ and equal bilaterally. Normal finger to nose and rapid alternating movements. Normal heel to shin balance. No pronator drift.  Skin: Skin is warm and dry. No rash noted. She is not diaphoretic.  No vesicular-like rash noted on the face or scalp  Psychiatric: She has a normal mood and affect.  Nursing note and vitals reviewed.    ED Treatments / Results  Labs (all labs ordered are listed, but only abnormal results are displayed) Labs Reviewed - No data to display  EKG None  Radiology No results found.  Procedures Procedures (including critical care time)  Medications Ordered in ED Medications  fluorescein ophthalmic strip 1 strip (has no administration in time range)  tetracaine (PONTOCAINE) 0.5 % ophthalmic solution 2 drop (has no administration in time range)     Initial Impression / Assessment and Plan / ED Course  I have reviewed the triage vital signs and the nursing notes.  Pertinent labs & imaging results that were available during my care of the patient were reviewed by me and considered in my medical decision making (see chart for details).     52 y.o. female with a history of MS presenting to the emergency department today for bilateral eye redness, photophobia, tearing, mild blurring and pain.  She was sent over by her PCP for concern of possible MS flare.  On exam the patient is noted to have photophobia, consensual photophobia conjunctival and scleral injection that is limbic sparing.  There is no afferent pupillary defect.  There is no rash  that follows the V1 distribution or dendritic-like staining on exam that makes me concern for zoster infection.  Exam is not concerning for foreign body, orbital cellulitis, periorbital cellulitis, hyphema, hypopyon, corneal ulcer, corneal abrasion, conjunctivitis, glaucoma, orbital globe rupture.  Suspect iritis as a cause of the patient's symptoms.  Low suspicion for optic neuritis given bilateral nature and findings.  Discussed case  with ophthalmologist Dr. Noel Journey who states he does not have concern for optic neuritis at this time.  He feels that this is likely secondary to iritis.  He recommends starting the patient on 1% prednisolone eyedrops 4 times daily and artificial tears for comfort.  I gave the patient a bottle of prednisolone in the department.  Patient is to follow with Dr. Manuella Ghazi of [facility] on Monday morning. The evaluation does not show pathology that would require ongoing emergent intervention or inpatient treatment. I advised the patient to return to the emergency department with new or worsening symptoms or new concerns. Specific return precautions discussed. The patient verbalized understanding and agreement with plan. All questions answered. No further questions at this time. The patient is hemodynamically stable, mentating appropriately and appears safe for discharge.  Final Clinical Impressions(s) / ED Diagnoses   Final diagnoses:  Iritis    ED Discharge Orders    None       Lorelle Gibbs 09/03/17 2341    Orlie Dakin, MD 09/04/17 [phone]

## 2017-09-03 NOTE — ED Triage Notes (Signed)
Pt sent from primary care doctor for bilateral eye pain with the R eye hurting worse. Pt states she is sensitive to light and has drainage. Per [facility] paperwork pt could not tolerate eye exam and is concerned for pink eye vs. possible optic infection. Also concerned for possible MS flare up.

## 2017-09-09 ENCOUNTER — Other Ambulatory Visit: Payer: BLUE CROSS/BLUE SHIELD

## 2017-09-09 ENCOUNTER — Ambulatory Visit: Payer: BLUE CROSS/BLUE SHIELD

## 2017-09-10 ENCOUNTER — Ambulatory Visit (INDEPENDENT_AMBULATORY_CARE_PROVIDER_SITE_OTHER): Payer: BLUE CROSS/BLUE SHIELD | Admitting: Family Medicine

## 2017-09-10 ENCOUNTER — Other Ambulatory Visit (INDEPENDENT_AMBULATORY_CARE_PROVIDER_SITE_OTHER): Payer: Medicare Other

## 2017-09-10 DIAGNOSIS — D72810 Lymphocytopenia: Secondary | ICD-10-CM | POA: Diagnosis not present

## 2017-09-10 DIAGNOSIS — I10 Essential (primary) hypertension: Secondary | ICD-10-CM

## 2017-09-10 DIAGNOSIS — D509 Iron deficiency anemia, unspecified: Secondary | ICD-10-CM | POA: Diagnosis not present

## 2017-09-10 DIAGNOSIS — R209 Unspecified disturbances of skin sensation: Secondary | ICD-10-CM

## 2017-09-10 DIAGNOSIS — E538 Deficiency of other specified B group vitamins: Secondary | ICD-10-CM | POA: Diagnosis not present

## 2017-09-10 LAB — CBC WITH DIFFERENTIAL/PLATELET
BASOS PCT: 0.8 % (ref 0.0–3.0)
Basophils Absolute: 0 10*3/uL (ref 0.0–0.1)
Eosinophils Absolute: 0.2 10*3/uL (ref 0.0–0.7)
Eosinophils Relative: 3 % (ref 0.0–5.0)
HCT: 37.1 % (ref 36.0–46.0)
HEMOGLOBIN: 12.2 g/dL (ref 12.0–15.0)
Lymphocytes Relative: 20.3 % (ref 12.0–46.0)
Lymphs Abs: 1 10*3/uL (ref 0.7–4.0)
MCHC: 32.9 g/dL (ref 30.0–36.0)
MCV: 81.1 fl (ref 78.0–100.0)
MONO ABS: 0.4 10*3/uL (ref 0.1–1.0)
Monocytes Relative: 8.8 % (ref 3.0–12.0)
Neutro Abs: 3.4 10*3/uL (ref 1.4–7.7)
Neutrophils Relative %: 67.1 % (ref 43.0–77.0)
Platelets: 255 10*3/uL (ref 150.0–400.0)
RBC: 4.57 Mil/uL (ref 3.87–5.11)
RDW: 17.1 % — AB (ref 11.5–15.5)
WBC: 5.1 10*3/uL (ref 4.0–10.5)

## 2017-09-10 LAB — COMPREHENSIVE METABOLIC PANEL
ALBUMIN: 4.1 g/dL (ref 3.5–5.2)
ALT: 8 U/L (ref 0–35)
AST: 11 U/L (ref 0–37)
Alkaline Phosphatase: 91 U/L (ref 39–117)
BUN: 10 mg/dL (ref 6–23)
CHLORIDE: 103 meq/L (ref 96–112)
CO2: 27 mEq/L (ref 19–32)
CREATININE: 0.99 mg/dL (ref 0.40–1.20)
Calcium: 8.8 mg/dL (ref 8.4–10.5)
GFR: 76.54 mL/min (ref >60.00)
GLUCOSE: 84 mg/dL (ref 70–99)
Potassium: 3.7 mEq/L (ref 3.5–5.1)
SODIUM: 138 meq/L (ref 135–145)
Total Bilirubin: 0.4 mg/dL (ref 0.2–1.2)
Total Protein: 7.2 g/dL (ref 6.0–8.3)

## 2017-09-10 LAB — MAGNESIUM: MAGNESIUM: 1.9 mg/dL (ref 1.5–2.5)

## 2017-09-10 MED ORDER — CYANOCOBALAMIN 1000 MCG/ML IJ SOLN
1000.0000 ug | Freq: Once | INTRAMUSCULAR | Status: AC
  Administered 2017-09-10: 1000 ug via INTRAMUSCULAR

## 2017-09-10 NOTE — Progress Notes (Signed)
Per orders of Dr. Banks, injection of Vitamin B 12 given by Timonthy Hovater ANN. Patient tolerated injection well. 

## 2017-09-12 LAB — T-HELPER CELLS (CD4) COUNT (NOT AT ARMC)
Absolute CD4: 127 cells/uL — ABNORMAL LOW (ref 490–1740)
CD4 T Helper %: 11 % — ABNORMAL LOW (ref 30–61)
Total lymphocyte count: 1210 cells/uL (ref 850–3900)

## 2017-10-12 ENCOUNTER — Ambulatory Visit: Payer: Medicare Other

## 2017-10-14 ENCOUNTER — Telehealth: Payer: Self-pay | Admitting: Family Medicine

## 2017-10-14 ENCOUNTER — Other Ambulatory Visit: Payer: BLUE CROSS/BLUE SHIELD

## 2017-10-14 ENCOUNTER — Ambulatory Visit (INDEPENDENT_AMBULATORY_CARE_PROVIDER_SITE_OTHER): Payer: BLUE CROSS/BLUE SHIELD | Admitting: Family Medicine

## 2017-10-14 ENCOUNTER — Ambulatory Visit: Payer: Medicare Other

## 2017-10-14 ENCOUNTER — Other Ambulatory Visit (INDEPENDENT_AMBULATORY_CARE_PROVIDER_SITE_OTHER): Payer: BLUE CROSS/BLUE SHIELD

## 2017-10-14 DIAGNOSIS — E538 Deficiency of other specified B group vitamins: Secondary | ICD-10-CM | POA: Diagnosis not present

## 2017-10-14 DIAGNOSIS — I10 Essential (primary) hypertension: Secondary | ICD-10-CM | POA: Diagnosis not present

## 2017-10-14 DIAGNOSIS — R209 Unspecified disturbances of skin sensation: Secondary | ICD-10-CM

## 2017-10-14 DIAGNOSIS — D509 Iron deficiency anemia, unspecified: Secondary | ICD-10-CM

## 2017-10-14 LAB — CBC WITH DIFFERENTIAL/PLATELET
BASOS PCT: 0.6 % (ref 0.0–3.0)
Basophils Absolute: 0 10*3/uL (ref 0.0–0.1)
EOS ABS: 0.2 10*3/uL (ref 0.0–0.7)
EOS PCT: 2.5 % (ref 0.0–5.0)
HEMATOCRIT: 37.4 % (ref 36.0–46.0)
Hemoglobin: 12.1 g/dL (ref 12.0–15.0)
LYMPHS PCT: 18.8 % (ref 12.0–46.0)
Lymphs Abs: 1.3 10*3/uL (ref 0.7–4.0)
MCHC: 32.2 g/dL (ref 30.0–36.0)
MCV: 81.2 fl (ref 78.0–100.0)
MONOS PCT: 7.7 % (ref 3.0–12.0)
Monocytes Absolute: 0.5 10*3/uL (ref 0.1–1.0)
NEUTROS ABS: 4.8 10*3/uL (ref 1.4–7.7)
Neutrophils Relative %: 70.4 % (ref 43.0–77.0)
PLATELETS: 255 10*3/uL (ref 150.0–400.0)
RBC: 4.61 Mil/uL (ref 3.87–5.11)
RDW: 16.1 % — AB (ref 11.5–15.5)
WBC: 6.8 10*3/uL (ref 4.0–10.5)

## 2017-10-14 LAB — URINALYSIS, MICROSCOPIC ONLY

## 2017-10-14 LAB — COMPREHENSIVE METABOLIC PANEL
ALBUMIN: 4 g/dL (ref 3.5–5.2)
ALT: 6 U/L (ref 0–35)
AST: 9 U/L (ref 0–37)
Alkaline Phosphatase: 89 U/L (ref 39–117)
BUN: 9 mg/dL (ref 6–23)
CALCIUM: 8.8 mg/dL (ref 8.4–10.5)
CHLORIDE: 102 meq/L (ref 96–112)
CO2: 27 meq/L (ref 19–32)
CREATININE: 0.95 mg/dL (ref 0.40–1.20)
GFR: 80.24 mL/min (ref >60.00)
Glucose, Bld: 79 mg/dL (ref 70–99)
POTASSIUM: 4.1 meq/L (ref 3.5–5.1)
Sodium: 137 mEq/L (ref 135–145)
Total Bilirubin: 0.3 mg/dL (ref 0.2–1.2)
Total Protein: 7.1 g/dL (ref 6.0–8.3)

## 2017-10-14 LAB — MAGNESIUM: Magnesium: 2 mg/dL (ref 1.5–2.5)

## 2017-10-14 MED ORDER — CYANOCOBALAMIN 1000 MCG/ML IJ SOLN
1000.0000 ug | Freq: Once | INTRAMUSCULAR | Status: AC
  Administered 2017-10-14: 1000 ug via INTRAMUSCULAR

## 2017-10-14 NOTE — Telephone Encounter (Signed)
Pt would like to establish care with Dr. Sarajane Jews, say's she has discussed this with Dr. Sarajane Jews in the past, if ever Dr. Shan Levans she would like for Dr. Sarajane Jews to take over as PCP.

## 2017-10-14 NOTE — Progress Notes (Signed)
Per orders of Dr. Martinique, injection of Vitamin B 12 given by Aggie Hacker ANN. Patient tolerated injection well.    Dr. Burnice Logan is out of office today.

## 2017-10-15 LAB — PREGNANCY, URINE: PREG TEST UR: NEGATIVE

## 2017-10-15 LAB — EXTRA URINE SPECIMEN

## 2017-10-15 NOTE — Telephone Encounter (Signed)
I left a voice message with below information, pt said it was okay to leave a message when she made this request.

## 2017-10-15 NOTE — Telephone Encounter (Signed)
Yes I can see her thanks  

## 2017-11-11 ENCOUNTER — Ambulatory Visit (INDEPENDENT_AMBULATORY_CARE_PROVIDER_SITE_OTHER): Payer: BLUE CROSS/BLUE SHIELD | Admitting: *Deleted

## 2017-11-11 ENCOUNTER — Other Ambulatory Visit (INDEPENDENT_AMBULATORY_CARE_PROVIDER_SITE_OTHER): Payer: BLUE CROSS/BLUE SHIELD

## 2017-11-11 DIAGNOSIS — I10 Essential (primary) hypertension: Secondary | ICD-10-CM | POA: Diagnosis not present

## 2017-11-11 DIAGNOSIS — D509 Iron deficiency anemia, unspecified: Secondary | ICD-10-CM

## 2017-11-11 DIAGNOSIS — E538 Deficiency of other specified B group vitamins: Secondary | ICD-10-CM | POA: Diagnosis not present

## 2017-11-11 DIAGNOSIS — R209 Unspecified disturbances of skin sensation: Secondary | ICD-10-CM

## 2017-11-11 LAB — MAGNESIUM: Magnesium: 2 mg/dL (ref 1.5–2.5)

## 2017-11-11 LAB — CBC WITH DIFFERENTIAL/PLATELET
Basophils Absolute: 0 10*3/uL (ref 0.0–0.1)
Basophils Relative: 0.6 % (ref 0.0–3.0)
EOS PCT: 2.7 % (ref 0.0–5.0)
Eosinophils Absolute: 0.2 10*3/uL (ref 0.0–0.7)
HCT: 37.8 % (ref 36.0–46.0)
Hemoglobin: 12.4 g/dL (ref 12.0–15.0)
LYMPHS ABS: 1.2 10*3/uL (ref 0.7–4.0)
Lymphocytes Relative: 18 % (ref 12.0–46.0)
MCHC: 32.8 g/dL (ref 30.0–36.0)
MCV: 79.6 fl (ref 78.0–100.0)
MONOS PCT: 5.8 % (ref 3.0–12.0)
Monocytes Absolute: 0.4 10*3/uL (ref 0.1–1.0)
NEUTROS ABS: 4.8 10*3/uL (ref 1.4–7.7)
NEUTROS PCT: 72.9 % (ref 43.0–77.0)
PLATELETS: 249 10*3/uL (ref 150.0–400.0)
RBC: 4.74 Mil/uL (ref 3.87–5.11)
RDW: 15.9 % — ABNORMAL HIGH (ref 11.5–15.5)
WBC: 6.6 10*3/uL (ref 4.0–10.5)

## 2017-11-11 LAB — COMPREHENSIVE METABOLIC PANEL
ALT: 11 U/L (ref 0–35)
AST: 12 U/L (ref 0–37)
Albumin: 4.3 g/dL (ref 3.5–5.2)
Alkaline Phosphatase: 106 U/L (ref 39–117)
BUN: 11 mg/dL (ref 6–23)
CO2: 27 meq/L (ref 19–32)
Calcium: 9.3 mg/dL (ref 8.4–10.5)
Chloride: 102 mEq/L (ref 96–112)
Creatinine, Ser: 0.92 mg/dL (ref 0.40–1.20)
GFR: 83.24 mL/min (ref >60.00)
GLUCOSE: 90 mg/dL (ref 70–99)
Potassium: 3.9 mEq/L (ref 3.5–5.1)
Sodium: 137 mEq/L (ref 135–145)
Total Bilirubin: 0.4 mg/dL (ref 0.2–1.2)
Total Protein: 7.4 g/dL (ref 6.0–8.3)

## 2017-11-11 LAB — POCT URINE PREGNANCY: PREG TEST UR: NEGATIVE

## 2017-11-11 LAB — URINALYSIS, MICROSCOPIC ONLY: RBC / HPF: NONE SEEN (ref 0–?)

## 2017-11-11 MED ORDER — CYANOCOBALAMIN 1000 MCG/ML IJ SOLN
1000.0000 ug | Freq: Once | INTRAMUSCULAR | Status: AC
  Administered 2017-11-11: 1000 ug via INTRAMUSCULAR

## 2017-11-11 NOTE — Progress Notes (Signed)
Per orders of Dr. Kwiatkowski, injection of B12 given by Nakayla Rorabaugh J Joevon Holliman. Patient tolerated injection well. 

## 2017-11-11 NOTE — Addendum Note (Signed)
Addended by: Rene Kocher on: 11/11/2017 03:42 PM   Modules accepted: Orders

## 2017-11-11 NOTE — Addendum Note (Signed)
Addended by: Rene Kocher on: 11/11/2017 11:15 AM   Modules accepted: Orders

## 2017-11-12 LAB — T-HELPER CELLS (CD4) COUNT (NOT AT ARMC)
Absolute CD4: 153 cells/uL — ABNORMAL LOW (ref 490–1740)
CD4 T HELPER %: 12 % — AB (ref 30–61)
TOTAL LYMPHOCYTE COUNT: 1308 {cells}/uL (ref 850–3900)

## 2017-11-17 ENCOUNTER — Telehealth: Payer: Self-pay | Admitting: Internal Medicine

## 2017-11-17 NOTE — Telephone Encounter (Signed)
Copied from [place] [phone]. Topic: Quick Communication - See Telephone Encounter >> Nov 17, 2017  9:43 AM Bea Graff, NT wrote: CRM for notification. See Telephone encounter for: 11/17/17. Page with [facility]on Passey calling to see if a CMA or doctor can give her a call to discuss hydrOXYzine (ATARAX/VISTARIL) 25 MG tablet and famotidine (PEPCID) 40 MG/5ML suspension in regards to a tier exception for this pt so that the medication may be covered at a lower cost. CB#: [phone] opt 0 then 3 then 3. REF#: [id number]

## 2017-11-19 ENCOUNTER — Other Ambulatory Visit: Payer: Self-pay | Admitting: Internal Medicine

## 2017-11-22 NOTE — Telephone Encounter (Signed)
Fax received from [facility] stating the request for Hydroxyzine 25mg  tablet was covered from 11/19/2017-05/17/2018.  I called [facility] and informed Santiago Glad of this and she stated the pt has picked up the Rx.   Message sent to Dr Truddie Hidden asst as Juluis Rainier.

## 2017-11-22 NOTE — Telephone Encounter (Signed)
Fax also received stating Famotidine 40mg  susp approved and I informed [facility] of this as well and she stated the pt picked up the Rx.

## 2017-11-23 NOTE — Telephone Encounter (Signed)
Pt was already approved on Rx. No further action needed.

## 2017-11-23 NOTE — Telephone Encounter (Signed)
Okay thank you. I will bring you the papers.

## 2017-12-13 DIAGNOSIS — H04123 Dry eye syndrome of bilateral lacrimal glands: Secondary | ICD-10-CM | POA: Diagnosis not present

## 2017-12-14 ENCOUNTER — Other Ambulatory Visit (INDEPENDENT_AMBULATORY_CARE_PROVIDER_SITE_OTHER): Payer: PPO

## 2017-12-14 ENCOUNTER — Ambulatory Visit: Payer: Self-pay

## 2017-12-14 ENCOUNTER — Other Ambulatory Visit: Payer: BLUE CROSS/BLUE SHIELD

## 2017-12-14 ENCOUNTER — Ambulatory Visit (INDEPENDENT_AMBULATORY_CARE_PROVIDER_SITE_OTHER): Payer: PPO | Admitting: Family Medicine

## 2017-12-14 DIAGNOSIS — R209 Unspecified disturbances of skin sensation: Secondary | ICD-10-CM | POA: Diagnosis not present

## 2017-12-14 DIAGNOSIS — D509 Iron deficiency anemia, unspecified: Secondary | ICD-10-CM | POA: Diagnosis not present

## 2017-12-14 DIAGNOSIS — I10 Essential (primary) hypertension: Secondary | ICD-10-CM | POA: Diagnosis not present

## 2017-12-14 DIAGNOSIS — E538 Deficiency of other specified B group vitamins: Secondary | ICD-10-CM

## 2017-12-14 LAB — POCT URINE PREGNANCY: Preg Test, Ur: NEGATIVE

## 2017-12-14 MED ORDER — CYANOCOBALAMIN 1000 MCG/ML IJ SOLN
1000.0000 ug | Freq: Once | INTRAMUSCULAR | Status: AC
  Administered 2017-12-14: 1000 ug via INTRAMUSCULAR

## 2017-12-14 NOTE — Progress Notes (Signed)
Per orders of Dr. Kwiatkowski, injection of Vitamin B 12 given by Raunak Antuna ANN. Patient tolerated injection well. 

## 2017-12-15 LAB — COMPREHENSIVE METABOLIC PANEL
ALT: 13 U/L (ref 0–35)
AST: 14 U/L (ref 0–37)
Albumin: 4.1 g/dL (ref 3.5–5.2)
Alkaline Phosphatase: 93 U/L (ref 39–117)
BUN: 9 mg/dL (ref 6–23)
CALCIUM: 9.3 mg/dL (ref 8.4–10.5)
CHLORIDE: 103 meq/L (ref 96–112)
CO2: 28 meq/L (ref 19–32)
CREATININE: 1 mg/dL (ref 0.40–1.20)
GFR: 75.58 mL/min (ref >60.00)
Glucose, Bld: 94 mg/dL (ref 70–99)
Potassium: 3.8 mEq/L (ref 3.5–5.1)
Sodium: 137 mEq/L (ref 135–145)
Total Bilirubin: 0.4 mg/dL (ref 0.2–1.2)
Total Protein: 7.1 g/dL (ref 6.0–8.3)

## 2017-12-15 LAB — CBC WITH DIFFERENTIAL/PLATELET
BASOS ABS: 0.1 10*3/uL (ref 0.0–0.1)
Basophils Relative: 1.3 % (ref 0.0–3.0)
Eosinophils Absolute: 0.2 10*3/uL (ref 0.0–0.7)
Eosinophils Relative: 3.4 % (ref 0.0–5.0)
HEMATOCRIT: 36.9 % (ref 36.0–46.0)
HEMOGLOBIN: 12.1 g/dL (ref 12.0–15.0)
LYMPHS PCT: 25.4 % (ref 12.0–46.0)
Lymphs Abs: 1.6 10*3/uL (ref 0.7–4.0)
MCHC: 32.9 g/dL (ref 30.0–36.0)
MCV: 80.4 fl (ref 78.0–100.0)
Monocytes Absolute: 0.5 10*3/uL (ref 0.1–1.0)
Monocytes Relative: 8 % (ref 3.0–12.0)
Neutro Abs: 3.8 10*3/uL (ref 1.4–7.7)
Neutrophils Relative %: 61.9 % (ref 43.0–77.0)
PLATELETS: 277 10*3/uL (ref 150.0–400.0)
RBC: 4.59 Mil/uL (ref 3.87–5.11)
RDW: 16.4 % — ABNORMAL HIGH (ref 11.5–15.5)
WBC: 6.2 10*3/uL (ref 4.0–10.5)

## 2017-12-15 LAB — URINALYSIS, MICROSCOPIC ONLY

## 2017-12-15 LAB — T-HELPER CELLS (CD4) COUNT (NOT AT ARMC)
Absolute CD4: 213 cells/uL — ABNORMAL LOW (ref 490–1740)
CD4 T Helper %: 12 % — ABNORMAL LOW (ref 30–61)
Total lymphocyte count: 1741 cells/uL (ref 850–3900)

## 2017-12-15 LAB — MAGNESIUM: MAGNESIUM: 2.1 mg/dL (ref 1.5–2.5)

## 2018-02-03 DIAGNOSIS — M542 Cervicalgia: Secondary | ICD-10-CM | POA: Diagnosis not present

## 2018-02-03 DIAGNOSIS — G35 Multiple sclerosis: Secondary | ICD-10-CM | POA: Diagnosis not present

## 2018-02-03 DIAGNOSIS — M5431 Sciatica, right side: Secondary | ICD-10-CM | POA: Diagnosis not present

## 2018-02-10 ENCOUNTER — Ambulatory Visit (INDEPENDENT_AMBULATORY_CARE_PROVIDER_SITE_OTHER): Payer: PPO

## 2018-02-10 DIAGNOSIS — E538 Deficiency of other specified B group vitamins: Secondary | ICD-10-CM | POA: Diagnosis not present

## 2018-02-10 MED ORDER — CYANOCOBALAMIN 1000 MCG/ML IJ SOLN
1000.0000 ug | Freq: Once | INTRAMUSCULAR | Status: AC
  Administered 2018-02-10: 1000 ug via INTRAMUSCULAR

## 2018-02-10 NOTE — Progress Notes (Signed)
Per orders of Dr. Fry, injection of B12 given by Marchello Rothgeb R Dyami Umbach. Patient tolerated injection well.  

## 2018-02-22 ENCOUNTER — Encounter: Payer: Self-pay | Admitting: Family Medicine

## 2018-02-22 ENCOUNTER — Ambulatory Visit (INDEPENDENT_AMBULATORY_CARE_PROVIDER_SITE_OTHER): Payer: PPO | Admitting: Family Medicine

## 2018-02-22 VITALS — BP 130/90 | HR 78 | Temp 98.5°F | Wt 261.4 lb

## 2018-02-22 DIAGNOSIS — M5441 Lumbago with sciatica, right side: Secondary | ICD-10-CM

## 2018-02-22 DIAGNOSIS — M5442 Lumbago with sciatica, left side: Secondary | ICD-10-CM | POA: Diagnosis not present

## 2018-02-22 MED ORDER — METHYLPREDNISOLONE ACETATE 80 MG/ML IJ SUSP
80.0000 mg | Freq: Once | INTRAMUSCULAR | Status: AC
  Administered 2018-02-22: 80 mg via INTRAMUSCULAR

## 2018-02-22 MED ORDER — MELOXICAM 15 MG PO TABS: 15.0000 mg | ORAL_TABLET | Freq: Every day | ORAL | 3 refills | Status: DC

## 2018-02-22 NOTE — Progress Notes (Signed)
   Subjective:    Patient ID: [name], female    DOB: 1965-10-12, 52 y.o.   MRN: [id number]  HPI Here for worsening low back pain over the past 3 weeks. She has had low back pain for 11 years and it has started radiating down both legs. In the past 3 weeks it has been especially painful. She takes Tizanidine and Gabapentin daily. She has added Ibuprofen with mixed results. No recent trauma. She had an MRI of the lumbar spine in 2016 showing only some mild degeneration of the discs.   Review of Systems  Constitutional: Negative.   Respiratory: Negative.   Cardiovascular: Negative.   Musculoskeletal: Positive for back pain.  Neurological: Positive for weakness and numbness.       Objective:   Physical Exam  Constitutional: She is oriented to person, place, and time.  In pain   Cardiovascular: Normal rate, regular rhythm, normal heart sounds and intact distal pulses.  Pulmonary/Chest: Effort normal and breath sounds normal.  Musculoskeletal:  Tender over the lower spine and both sciatic notches. ROM is full. SLR are negative   Neurological: She is alert and oriented to person, place, and time.          Assessment & Plan:  Low back pain with sciatica. Given a d steroid shot. She will start taking Meloxicam daily. Recheck prn.  Alysia Penna, MD

## 2018-02-25 ENCOUNTER — Telehealth: Payer: Self-pay | Admitting: Family Medicine

## 2018-02-25 MED ORDER — AZITHROMYCIN 250 MG PO TABS: ORAL_TABLET | ORAL | 0 refills | Status: DC

## 2018-02-25 NOTE — Telephone Encounter (Signed)
Dr. Fry please advise. Thanks  

## 2018-02-25 NOTE — Telephone Encounter (Signed)
Yes tell her to take the Zpack

## 2018-02-25 NOTE — Telephone Encounter (Signed)
Called and spoke with pt and she is aware of Dr. Barbie Banner recs to take the zpak.  This has been sent to the pharmacy

## 2018-02-25 NOTE — Telephone Encounter (Signed)
Copied from [place] [phone]. Topic: General - Other >> Feb 22, 2018  2:09 PM Yvette Rack wrote: Reason for CRM: pt calling wanting to know if Dr Sarajane Jews still wanted her to take the azithromycin (ZITHROMAX Z-PAK) 250 MG tablet >> Feb 25, 2018  4:08 PM Keene Breath wrote: Patient called to get the status of her previous message to the doctor.  Patient states that she still has not heard from the nurse or doctor regarding the [place].  Please call patient back at [phone]

## 2018-02-25 NOTE — Addendum Note (Signed)
Addended by: Elie Confer on: 02/25/2018 05:14 PM   Modules accepted: Orders

## 2018-03-08 ENCOUNTER — Ambulatory Visit (INDEPENDENT_AMBULATORY_CARE_PROVIDER_SITE_OTHER): Payer: PPO | Admitting: Family Medicine

## 2018-03-08 ENCOUNTER — Encounter: Payer: Self-pay | Admitting: Family Medicine

## 2018-03-08 VITALS — BP 130/88 | HR 78 | Temp 98.4°F | Wt 266.6 lb

## 2018-03-08 DIAGNOSIS — E538 Deficiency of other specified B group vitamins: Secondary | ICD-10-CM | POA: Diagnosis not present

## 2018-03-08 DIAGNOSIS — M5442 Lumbago with sciatica, left side: Secondary | ICD-10-CM | POA: Diagnosis not present

## 2018-03-08 DIAGNOSIS — M5441 Lumbago with sciatica, right side: Secondary | ICD-10-CM | POA: Diagnosis not present

## 2018-03-08 MED ORDER — PREDNISONE 20 MG PO TABS: 20.0000 mg | ORAL_TABLET | Freq: Two times a day (BID) | ORAL | 0 refills | Status: DC

## 2018-03-08 MED ORDER — METHYLPREDNISOLONE ACETATE 80 MG/ML IJ SUSP
120.0000 mg | Freq: Once | INTRAMUSCULAR | Status: AC
  Administered 2018-03-08: 120 mg via INTRAMUSCULAR

## 2018-03-08 MED ORDER — CYANOCOBALAMIN 1000 MCG/ML IJ SOLN
1000.0000 ug | Freq: Once | INTRAMUSCULAR | Status: AC
  Administered 2018-03-08: 1000 ug via INTRAMUSCULAR

## 2018-03-08 NOTE — Progress Notes (Signed)
   Subjective:    Patient ID: [name], female    DOB: [birth year], 52 y.o.   MRN: [id number]  HPI Here to follow up on low back pain. She was here a few weeks ago and received a steroid shot, and this helped her pain for about one day only. Now she has pain despite taking Meloxicam. It radiates down both legs, worse on the right side.    Review of Systems  Constitutional: Negative.   Respiratory: Negative.   Cardiovascular: Negative.   Musculoskeletal: Positive for back pain.       Objective:   Physical Exam  Constitutional: She is oriented to person, place, and time. She appears well-developed and well-nourished.  Cardiovascular: Normal rate, regular rhythm, normal heart sounds and intact distal pulses.  Pulmonary/Chest: Effort normal and breath sounds normal.  Neurological: She is alert and oriented to person, place, and time.          Assessment & Plan:  Lumbar pain with sciatica. Given another steroid shot and she will take Prednisone 20 mg bid for a few days. Set up another lumbar MRI scan soon.  Alysia Penna, MD

## 2018-03-08 NOTE — Addendum Note (Signed)
Addended by: Elie Confer on: 03/08/2018 11:55 AM   Modules accepted: Orders

## 2018-03-09 ENCOUNTER — Other Ambulatory Visit (INDEPENDENT_AMBULATORY_CARE_PROVIDER_SITE_OTHER): Payer: PPO

## 2018-03-09 DIAGNOSIS — R209 Unspecified disturbances of skin sensation: Secondary | ICD-10-CM

## 2018-03-09 DIAGNOSIS — D509 Iron deficiency anemia, unspecified: Secondary | ICD-10-CM

## 2018-03-09 DIAGNOSIS — I10 Essential (primary) hypertension: Secondary | ICD-10-CM | POA: Diagnosis not present

## 2018-03-09 DIAGNOSIS — G35 Multiple sclerosis: Secondary | ICD-10-CM

## 2018-03-09 LAB — POCT URINE PREGNANCY: Preg Test, Ur: NEGATIVE

## 2018-03-10 ENCOUNTER — Other Ambulatory Visit: Payer: PPO

## 2018-03-10 ENCOUNTER — Ambulatory Visit: Payer: PPO

## 2018-03-10 LAB — CBC WITH DIFFERENTIAL/PLATELET
BASOS PCT: 0.1 % (ref 0.0–3.0)
Basophils Absolute: 0 10*3/uL (ref 0.0–0.1)
Eosinophils Absolute: 0.1 10*3/uL (ref 0.0–0.7)
Eosinophils Relative: 0.5 % (ref 0.0–5.0)
HCT: 34.7 % — ABNORMAL LOW (ref 36.0–46.0)
HEMOGLOBIN: 11.1 g/dL — AB (ref 12.0–15.0)
LYMPHS ABS: 2.3 10*3/uL (ref 0.7–4.0)
Lymphocytes Relative: 20 % (ref 12.0–46.0)
MCHC: 32.1 g/dL (ref 30.0–36.0)
MCV: 79.9 fl (ref 78.0–100.0)
MONOS PCT: 5.3 % (ref 3.0–12.0)
Monocytes Absolute: 0.6 10*3/uL (ref 0.1–1.0)
Neutro Abs: 8.5 10*3/uL — ABNORMAL HIGH (ref 1.4–7.7)
Neutrophils Relative %: 74.1 % (ref 43.0–77.0)
Platelets: 251 10*3/uL (ref 150.0–400.0)
RBC: 4.34 Mil/uL (ref 3.87–5.11)
RDW: 17.2 % — AB (ref 11.5–15.5)
WBC: 11.4 10*3/uL — AB (ref 4.0–10.5)

## 2018-03-10 LAB — COMPREHENSIVE METABOLIC PANEL
ALK PHOS: 83 U/L (ref 39–117)
ALT: 8 U/L (ref 0–35)
AST: 9 U/L (ref 0–37)
Albumin: 4 g/dL (ref 3.5–5.2)
BUN: 12 mg/dL (ref 6–23)
CHLORIDE: 104 meq/L (ref 96–112)
CO2: 26 mEq/L (ref 19–32)
Calcium: 8.8 mg/dL (ref 8.4–10.5)
Creatinine, Ser: 1.01 mg/dL (ref 0.40–1.20)
GFR: 74.64 mL/min (ref >60.00)
GLUCOSE: 99 mg/dL (ref 70–99)
POTASSIUM: 3.5 meq/L (ref 3.5–5.1)
SODIUM: 138 meq/L (ref 135–145)
TOTAL PROTEIN: 6.8 g/dL (ref 6.0–8.3)
Total Bilirubin: 0.3 mg/dL (ref 0.2–1.2)

## 2018-03-10 LAB — URINALYSIS, MICROSCOPIC ONLY: RBC / HPF: NONE SEEN (ref 0–?)

## 2018-03-10 LAB — MAGNESIUM: Magnesium: 2.1 mg/dL (ref 1.5–2.5)

## 2018-03-10 LAB — T-HELPER CELLS (CD4) COUNT (NOT AT ARMC)
ABSOLUTE CD4: 355 {cells}/uL — AB (ref 490–1740)
CD4 T HELPER %: 15 % — AB (ref 30–61)
Total lymphocyte count: 2445 cells/uL (ref 850–3900)

## 2018-03-16 DIAGNOSIS — G35 Multiple sclerosis: Secondary | ICD-10-CM | POA: Diagnosis not present

## 2018-03-16 DIAGNOSIS — H33321 Round hole, right eye: Secondary | ICD-10-CM | POA: Diagnosis not present

## 2018-03-21 ENCOUNTER — Telehealth: Payer: Self-pay | Admitting: *Deleted

## 2018-03-21 NOTE — Telephone Encounter (Signed)
Copied from [place] [id number]. Topic: Quick Communication - Lab Results (Clinic Use ONLY) >> Mar 21, 2018 10:27 AM Carolyn Stare wrote:  Pt would like a call back about CD4 results

## 2018-03-21 NOTE — Telephone Encounter (Signed)
Pt is requesting further recs for the CD 4 lab results.  Dr. Sarajane Jews please advise. Thanks

## 2018-03-23 DIAGNOSIS — H33321 Round hole, right eye: Secondary | ICD-10-CM | POA: Diagnosis not present

## 2018-03-23 DIAGNOSIS — H43813 Vitreous degeneration, bilateral: Secondary | ICD-10-CM | POA: Diagnosis not present

## 2018-03-24 NOTE — Telephone Encounter (Signed)
Her CD4 counts are stable, actually slightly improved from last time

## 2018-03-24 NOTE — Telephone Encounter (Signed)
Patient is aware of lab result. 

## 2018-03-24 NOTE — Telephone Encounter (Signed)
Left message for patient to call back. CRM created 

## 2018-04-11 ENCOUNTER — Other Ambulatory Visit: Payer: PPO

## 2018-04-11 ENCOUNTER — Ambulatory Visit (INDEPENDENT_AMBULATORY_CARE_PROVIDER_SITE_OTHER): Payer: PPO

## 2018-04-11 DIAGNOSIS — G35 Multiple sclerosis: Secondary | ICD-10-CM | POA: Diagnosis not present

## 2018-04-11 DIAGNOSIS — E538 Deficiency of other specified B group vitamins: Secondary | ICD-10-CM

## 2018-04-11 DIAGNOSIS — Z79899 Other long term (current) drug therapy: Secondary | ICD-10-CM | POA: Diagnosis not present

## 2018-04-11 MED ORDER — CYANOCOBALAMIN 1000 MCG/ML IJ SOLN
1000.0000 ug | Freq: Once | INTRAMUSCULAR | Status: AC
  Administered 2018-04-11: 1000 ug via INTRAMUSCULAR

## 2018-04-20 ENCOUNTER — Ambulatory Visit
Admission: RE | Admit: 2018-04-20 | Discharge: 2018-04-20 | Disposition: A | Discharge status: Home / Self Care | Payer: PPO | Source: Ambulatory Visit | Attending: Family Medicine | Admitting: Family Medicine

## 2018-04-20 DIAGNOSIS — M5442 Lumbago with sciatica, left side: Secondary | ICD-10-CM

## 2018-04-20 DIAGNOSIS — M5441 Lumbago with sciatica, right side: Secondary | ICD-10-CM

## 2018-04-20 DIAGNOSIS — M5116 Intervertebral disc disorders with radiculopathy, lumbar region: Secondary | ICD-10-CM | POA: Diagnosis not present

## 2018-04-20 DIAGNOSIS — M4726 Other spondylosis with radiculopathy, lumbar region: Secondary | ICD-10-CM | POA: Diagnosis not present

## 2018-04-20 DIAGNOSIS — M5117 Intervertebral disc disorders with radiculopathy, lumbosacral region: Secondary | ICD-10-CM | POA: Diagnosis not present

## 2018-04-21 ENCOUNTER — Other Ambulatory Visit: Payer: Self-pay | Admitting: Obstetrics & Gynecology

## 2018-04-21 DIAGNOSIS — Z1231 Encounter for screening mammogram for malignant neoplasm of breast: Secondary | ICD-10-CM

## 2018-04-27 NOTE — Addendum Note (Signed)
Addended by: Alysia Penna A on: 04/27/2018 08:32 AM   Modules accepted: Orders

## 2018-05-03 DIAGNOSIS — M5136 Other intervertebral disc degeneration, lumbar region: Secondary | ICD-10-CM | POA: Diagnosis not present

## 2018-05-03 DIAGNOSIS — Z6841 Body Mass Index (BMI) 40.0 and over, adult: Secondary | ICD-10-CM | POA: Diagnosis not present

## 2018-05-03 DIAGNOSIS — R030 Elevated blood-pressure reading, without diagnosis of hypertension: Secondary | ICD-10-CM | POA: Diagnosis not present

## 2018-05-04 ENCOUNTER — Telehealth: Payer: Self-pay | Admitting: Family Medicine

## 2018-05-04 DIAGNOSIS — H43813 Vitreous degeneration, bilateral: Secondary | ICD-10-CM | POA: Diagnosis not present

## 2018-05-04 NOTE — Telephone Encounter (Signed)
Copied from [place] [id number]. Topic: Quick Communication - Rx Refill/Question >> May 04, 2018  1:55 PM Sheppard Coil, [employer] L wrote: Medication:  acetaminophen (TYLENOL) 160 MG/5ML solution   acyclovir (ZOVIRAX) 200 MG capsule  Alemtuzumab (LEMTRADA IV)  cetirizine (ZYRTEC) 10 MG tablet  cholecalciferol (VITAMIN D) 1000 units tablet  citalopram (CELEXA) 20 MG tablet  cyclobenzaprine (FLEXERIL) 10 MG tablet  diphenhydrAMINE (BENADRYL) 12.5 MG/5ML elixir  famotidine (PEPCID) 40 MG/5ML suspension  furosemide (LASIX) 40 MG tablet  gabapentin (NEURONTIN) 300 MG capsule  hydroxypropyl methylcellulose / hypromellose (ISOPTO TEARS / GONIOVISC) 2.5 % ophthalmic solution  hydrOXYzine (ATARAX/VISTARIL) 25 MG tablet  ibuprofen (ADVIL,MOTRIN) 200 MG tablet  levETIRAcetam (KEPPRA) 100 MG/ML solution  meloxicam (MOBIC) 15 MG tablet  neomycin-polymyxin-hydrocortisone (CORTISPORIN) 3.5-10000-1 ophthalmic suspension  Oxcarbazepine (TRILEPTAL) 300 MG tablet  predniSONE (DELTASONE) 20 MG tablet  tizanidine (ZANAFLEX) 2 MG capsule  triamcinolone cream (KENALOG) 0.1 %   Has the patient contacted their pharmacy? Yes - states they are trying to get medications cheaper and need these sent to [facility].  Pt states [facility] can't take transfers and is asking provider to send new scripts for all drugs. (Agent: If no, request that the patient contact the pharmacy for the refill.) (Agent: If yes, when and what did the pharmacy advise?)  Preferred Pharmacy (with phone number or street name): ***  Agent: Please be advised that RX refills may take up to 3 business days. We ask that you follow-up with your pharmacy.

## 2018-05-04 NOTE — Telephone Encounter (Signed)
Dr. Fry please advise. Thanks  

## 2018-05-04 NOTE — Telephone Encounter (Signed)
Accidentally clicked sign instead of route.  Back in to TE to route to NT.

## 2018-05-04 NOTE — Telephone Encounter (Signed)
Pt states they are trying to get medications cheaper and requesting these be sent to [facility].  Pt states [facility] can not take transfers and is asking provider to send new scripts for all drugs. Medication:  acetaminophen (TYLENOL) 160 MG/5ML solution                acyclovir (ZOVIRAX) 200 MG capsule            Alemtuzumab (LEMTRADA IV)           cetirizine (ZYRTEC) 10 MG tablet       cholecalciferol (VITAMIN D) 1000 units tablet            citalopram (CELEXA) 20 MG tablet     cyclobenzaprine (FLEXERIL) 10 MG tablet    diphenhydrAMINE (BENADRYL) 12.5 MG/5ML elixir             famotidine (PEPCID) 40 MG/5ML suspension            furosemide (LASIX) 40 MG tablet       gabapentin (NEURONTIN) 300 MG capsule  hydroxypropyl methylcellulose / hypromellose (ISOPTO TEARS / GONIOVISC) 2.5 % ophthalmic solution     hydrOXYzine (ATARAX/VISTARIL) 25 MG tablet      ibuprofen (ADVIL,MOTRIN) 200 MG tablet     levETIRAcetam (KEPPRA) 100 MG/ML solution        meloxicam (MOBIC) 15 MG tablet      neomycin-polymyxin-hydrocortisone (CORTISPORIN) 3.5-10000-1 ophthalmic suspension      Oxcarbazepine (TRILEPTAL) 300 MG tablet  predniSONE (DELTASONE) 20 MG tablet     tizanidine (ZANAFLEX) 2 MG capsule            triamcinolone cream (KENALOG) 0.1 %

## 2018-05-05 NOTE — Telephone Encounter (Signed)
Have her make an OV for this, since it will take some time to do

## 2018-05-05 NOTE — Telephone Encounter (Signed)
CrM created and agent may schedule the pt for an appt to get refills done.  Thanks

## 2018-05-10 ENCOUNTER — Ambulatory Visit (INDEPENDENT_AMBULATORY_CARE_PROVIDER_SITE_OTHER): Payer: PPO | Admitting: *Deleted

## 2018-05-10 ENCOUNTER — Telehealth: Payer: Self-pay | Admitting: Family Medicine

## 2018-05-10 DIAGNOSIS — E538 Deficiency of other specified B group vitamins: Secondary | ICD-10-CM

## 2018-05-10 MED ORDER — CYANOCOBALAMIN 1000 MCG/ML IJ SOLN
1000.0000 ug | Freq: Once | INTRAMUSCULAR | Status: AC
  Administered 2018-05-10: 1000 ug via INTRAMUSCULAR

## 2018-05-10 NOTE — Telephone Encounter (Signed)
Pt came in today for her B12 injection and she stated that her sciatica pain is flared up and she wanted to get a depo medrol shot to help ease this pain.  She wants to come in on Thursday to get this done.  Dr. Sarajane Jews please advise. Thanks

## 2018-05-10 NOTE — Progress Notes (Signed)
B12 injection give today and pt tolerated well, per orders of Dr. Sarajane Jews.

## 2018-05-12 DIAGNOSIS — H5213 Myopia, bilateral: Secondary | ICD-10-CM | POA: Diagnosis not present

## 2018-05-12 DIAGNOSIS — H40013 Open angle with borderline findings, low risk, bilateral: Secondary | ICD-10-CM | POA: Diagnosis not present

## 2018-05-12 DIAGNOSIS — H2513 Age-related nuclear cataract, bilateral: Secondary | ICD-10-CM | POA: Diagnosis not present

## 2018-05-12 DIAGNOSIS — H40053 Ocular hypertension, bilateral: Secondary | ICD-10-CM | POA: Diagnosis not present

## 2018-05-12 DIAGNOSIS — H04123 Dry eye syndrome of bilateral lacrimal glands: Secondary | ICD-10-CM | POA: Diagnosis not present

## 2018-05-12 DIAGNOSIS — H33321 Round hole, right eye: Secondary | ICD-10-CM | POA: Diagnosis not present

## 2018-05-12 DIAGNOSIS — H20023 Recurrent acute iridocyclitis, bilateral: Secondary | ICD-10-CM | POA: Diagnosis not present

## 2018-05-12 NOTE — Telephone Encounter (Signed)
Okay to schedule a nurse visit for DepoMedrol 120 mg shot

## 2018-05-12 NOTE — Telephone Encounter (Signed)
I have called the pt to schedule that appt for the nurse visit to get the depo medrol 120 mg  Per Dr. Sarajane Jews >

## 2018-05-31 ENCOUNTER — Ambulatory Visit
Admission: RE | Admit: 2018-05-31 | Discharge: 2018-05-31 | Disposition: A | Discharge status: Home / Self Care | Payer: PPO | Source: Ambulatory Visit | Attending: Obstetrics & Gynecology | Admitting: Obstetrics & Gynecology

## 2018-05-31 ENCOUNTER — Other Ambulatory Visit: Payer: Self-pay | Admitting: Family Medicine

## 2018-05-31 DIAGNOSIS — Z1231 Encounter for screening mammogram for malignant neoplasm of breast: Secondary | ICD-10-CM

## 2018-06-02 ENCOUNTER — Emergency Department (HOSPITAL_COMMUNITY): Payer: PPO

## 2018-06-02 ENCOUNTER — Ambulatory Visit: Payer: Self-pay | Admitting: *Deleted

## 2018-06-02 ENCOUNTER — Encounter (HOSPITAL_COMMUNITY): Payer: Self-pay | Admitting: Family Medicine

## 2018-06-02 ENCOUNTER — Emergency Department (HOSPITAL_COMMUNITY)
Admission: EM | Admit: 2018-06-02 | Discharge: 2018-06-02 | Disposition: A | Discharge status: Home / Self Care | Payer: PPO | Attending: Emergency Medicine | Admitting: Emergency Medicine

## 2018-06-02 ENCOUNTER — Other Ambulatory Visit: Payer: Self-pay

## 2018-06-02 DIAGNOSIS — Z79899 Other long term (current) drug therapy: Secondary | ICD-10-CM | POA: Diagnosis not present

## 2018-06-02 DIAGNOSIS — R002 Palpitations: Secondary | ICD-10-CM | POA: Diagnosis not present

## 2018-06-02 DIAGNOSIS — R51 Headache: Secondary | ICD-10-CM | POA: Insufficient documentation

## 2018-06-02 DIAGNOSIS — R519 Headache, unspecified: Secondary | ICD-10-CM

## 2018-06-02 DIAGNOSIS — R079 Chest pain, unspecified: Secondary | ICD-10-CM

## 2018-06-02 LAB — CBC
HEMATOCRIT: 43.1 % (ref 36.0–46.0)
Hemoglobin: 13.2 g/dL (ref 12.0–15.0)
MCH: 25.3 pg — ABNORMAL LOW (ref 26.0–34.0)
MCHC: 30.6 g/dL (ref 30.0–36.0)
MCV: 82.7 fL (ref 80.0–100.0)
Platelets: 266 10*3/uL (ref 150–400)
RBC: 5.21 MIL/uL — AB (ref 3.87–5.11)
RDW: 16.3 % — ABNORMAL HIGH (ref 11.5–15.5)
WBC: 6.8 10*3/uL (ref 4.0–10.5)
nRBC: 0 % (ref 0.0–0.2)

## 2018-06-02 LAB — BASIC METABOLIC PANEL
ANION GAP: 9 (ref 5–15)
BUN: 13 mg/dL (ref 6–20)
CHLORIDE: 107 mmol/L (ref 98–111)
CO2: 22 mmol/L (ref 22–32)
Calcium: 8.9 mg/dL (ref 8.9–10.3)
Creatinine, Ser: 0.96 mg/dL (ref 0.44–1.00)
GFR calc Af Amer: >60 mL/min (ref >60)
GFR calc non Af Amer: >60 mL/min (ref >60)
Glucose, Bld: 91 mg/dL (ref 70–99)
Potassium: 3.6 mmol/L (ref 3.5–5.1)
Sodium: 138 mmol/L (ref 135–145)

## 2018-06-02 LAB — I-STAT BETA HCG BLOOD, ED (NOT ORDERABLE): I-stat hCG, quantitative: <5 m[IU]/mL (ref <5)

## 2018-06-02 LAB — TSH: TSH: 2.112 u[IU]/mL (ref 0.350–4.500)

## 2018-06-02 LAB — POCT I-STAT TROPONIN I
TROPONIN I, POC: 0 ng/mL (ref 0.00–0.08)
Troponin i, poc: 0 ng/mL (ref 0.00–0.08)

## 2018-06-02 MED ORDER — SODIUM CHLORIDE 0.9% FLUSH: 3.0000 mL | Freq: Once | INTRAVENOUS | Status: DC

## 2018-06-02 NOTE — Discharge Instructions (Signed)
Please decrease caffeine intake, hydrate with water, and get plenty of rest Follow up with your doctor Return if worsening

## 2018-06-02 NOTE — Telephone Encounter (Signed)
Patient states her heart is beating irregularly now- she can not find her pulse and so she can not tell me what her heart rate is- but because she is having and feeling symptoms now- patient advise to go to ED. She agrees to go.  Additional Information . Commented on: Heart beating very slowly (e.g., < 50 / minute)  (Exception: athlete)    Not sure of heart rate- patient is unable to palpate her pulse to count- she is feeling irregular rate now- advise ED now.  Answer Assessment - Initial Assessment Questions 1. DESCRIPTION: "Please describe your heart rate or heart beat that you are having" (e.g., fast/slow, regular/irregular, skipped or extra beats, "palpitations")     palpitations flutter or irregular heartbeat 2. ONSET: "When did it start?" (Minutes, hours or days)      Going on for some times- but getting more frequent and today it has been all day 3. DURATION: "How long does it last" (e.g., seconds, minutes, hours)     Today- all day, few minutes normally 4. PATTERN "Does it come and go, or has it been constant since it started?"  "Does it get worse with exertion?"   "Are you feeling it now?"     Constant today 5. TAP: "Using your hand, can you tap out what you are feeling on a chair or table in front of you, so that I can hear?" (Note: not all patients can do this)       Skipping and slow 6. HEART RATE: "Can you tell me your heart rate?" "How many beats in 15 seconds?"  (Note: not all patients can do this)        7. RECURRENT SYMPTOM: "Have you ever had this before?" If so, ask: "When was the last time?" and "What happened that time?"      Off/on for months- getting more frequent 8. CAUSE: "What do you think is causing the palpitations?"     unsure 9. CARDIAC HISTORY: "Do you have any history of heart disease?" (e.g., heart attack, angina, bypass surgery, angioplasty, arrhythmia)      no 10. OTHER SYMPTOMS: "Do you have any other symptoms?" (e.g., dizziness, chest pain, sweating,  difficulty breathing)       No- chest pain in the past- yesterday 11. PREGNANCY: "Is there any chance you are pregnant?" "When was your last menstrual period?"       LMP- long time  Protocols used: [place]

## 2018-06-02 NOTE — Telephone Encounter (Signed)
Pt being seen at ED now.

## 2018-06-02 NOTE — ED Triage Notes (Signed)
Patient is complaining of left sided chest pain that been going on for a while, but today it has been constant. States the pain radiates to her back and described as dull. Associated symptoms are nausea, lightheadedness, back pain and weakness. Also, reports she developed a headache earlier this morning. Described as sharp.

## 2018-06-02 NOTE — Telephone Encounter (Signed)
Will send to dr Sarajane Jews as Juluis Rainier of rec's given.  Will also hold for follow up of ED check in.

## 2018-06-02 NOTE — ED Provider Notes (Addendum)
[facility] DEPT Provider Note   CSN: [id number] Arrival date & time: 06/02/18  1407     History   Chief Complaint Chief Complaint  Patient presents with  . Chest Pain  . Headache    HPI LEVAEH VICE is a 53 y.o. female who presents with palpitations. PMH significant for HTN, MS, hx of CVA, GERD. She states that she's had palpitations for the past 1-2 months. They are intermittent. She has not had this evaluated before. Today they became constant and were associated with left sided chest pain. She also has some left arm pain and left upper back pain. It feels like a dull ache. She also notes she has a headache starting this morning. It feels like a migraine. She reports some nausea. She denies lightheadedness, syncope, severe headache, photophobia, vision changes, SOB, cough, abdominal pain, vomiting. She has chronic dizziness and paresthesias of the face from her MS. She called her doctor's office about her symptoms and they told her to come here. She has had normal thyroid testing in the past. She reports drinking lots of cokes daily. She denies any drug or alcohol abuse.  HPI  Past Medical History:  Diagnosis Date  . Anemia   . GERD 12/30/2006  . Headache(784.0)   . Hypertension   . Multiple sclerosis ([place])   . PARESTHESIA 12/30/2006  . Stroke [facility])     Patient Active Problem List   Diagnosis Date Noted  . Low back pain due to bilateral sciatica 02/22/2018  . Essential hypertension 05/30/2015  . Right-sided chest pain 06/27/2014  . Microcytic anemia 06/27/2014  . Multiple sclerosis exacerbation ([place]) 06/27/2014  . Obesity (BMI 30-39.9) 06/27/2014  . B12 deficiency 06/06/2013  . Multiple sclerosis ([place]) 08/12/2010  . GERD 12/30/2006  . PARESTHESIA 12/30/2006    Past Surgical History:  Procedure Laterality Date  . ANAL INTRAEPITHELIAL NEOPLASIA EXCISION    . BREAST BIOPSY    . CESAREAN SECTION    . EYE SURGERY Right   .  Myometomy    . TONSILLECTOMY       OB History   No obstetric history on file.      Home Medications    Prior to Admission medications   Medication Sig Start Date End Date Taking? Authorizing Provider  acetaminophen (TYLENOL) 160 MG/5ML solution Take 320 mg by mouth 2 (two) times daily as needed for mild pain. Reported on 07/21/2015    [provider]  acyclovir (ZOVIRAX) 200 MG capsule Take 200mg  (1 tablet) by mouth twice daily until physician discontinues medication. Start on first day of Lemtrada infusion. 11/28/15   [provider]  Alemtuzumab (LEMTRADA IV) Inject into the vein 5 days. Every September ( yearly )    [provider]  cetirizine (ZYRTEC) 10 MG tablet Take 10 mg by mouth at bedtime.    [provider]  cholecalciferol (VITAMIN D) 1000 units tablet Take 1,000 Units by mouth daily.    [provider]  citalopram (CELEXA) 20 MG tablet Take 20 mg by mouth daily.  07/09/16   [provider]  cyclobenzaprine (FLEXERIL) 10 MG tablet Take 10 mg by mouth 2 (two) times daily as needed for muscle spasms.     [provider]  diphenhydrAMINE (BENADRYL) 12.5 MG/5ML elixir Take 25 mg by mouth 4 (four) times daily as needed for allergies.    [provider]  famotidine (PEPCID) 40 MG/5ML suspension Take 40 mg by mouth at bedtime.  07/09/16  [provider]  furosemide (LASIX) 40 MG tablet Take 1 tablet (40 mg total) by mouth daily as needed for fluid. 07/16/16   Marletta Lor, MD  gabapentin (NEURONTIN) 300 MG capsule Take 300 mg by mouth 3 (three) times daily.    [provider]  hydroxypropyl methylcellulose / hypromellose (ISOPTO TEARS / GONIOVISC) 2.5 % ophthalmic solution Place 1 drop into both eyes 4 (four) times daily as needed for dry eyes. 09/03/17   Maczis, Barth Kirks, PA-C  hydrOXYzine (ATARAX/VISTARIL) 25 MG tablet TAKE 1 TABLET (25 MG TOTAL) BY MOUTH 3 (THREE) TIMES DAILY AS NEEDED FOR  ITCHING. 11/19/17   Marletta Lor, MD  ibuprofen (ADVIL,MOTRIN) 200 MG tablet Take 200 mg by mouth every 8 (eight) hours as needed.    [provider]  levETIRAcetam (KEPPRA) 100 MG/ML solution Take 500 mg by mouth 2 (two) times daily. 09/21/16   [provider]  meloxicam (MOBIC) 15 MG tablet Take 1 tablet (15 mg total) by mouth daily. 02/22/18   Laurey Morale, MD  neomycin-polymyxin-hydrocortisone (CORTISPORIN) 3.5-10000-1 ophthalmic suspension Place 2 drops into both eyes every 4 (four) hours. 08/18/17   Marletta Lor, MD  Oxcarbazepine (TRILEPTAL) 300 MG tablet Take 300 mg by mouth 2 (two) times daily.  06/11/16   [provider]  predniSONE (DELTASONE) 20 MG tablet Take 1 tablet (20 mg total) by mouth 2 (two) times daily with a meal. 03/08/18   Laurey Morale, MD  tizanidine (ZANAFLEX) 2 MG capsule Take 2 mg by mouth 2 (two) times daily.  07/09/16   [provider]  triamcinolone cream (KENALOG) 0.1 % Apply 1 application topically 2 (two) times daily. 01/12/17   Marletta Lor, MD    Family History Family History  Problem Relation Age of Onset  . Stroke Father   . Psychosis Sister   . Hypertension Other   . Diabetes Other   . Dementia Other     Social History Social History   Tobacco Use  . Smoking status: Never Smoker  . Smokeless tobacco: Never Used  Substance Use Topics  . Alcohol use: No  . Drug use: No     Allergies   Banana; Sulfacetamide sodium; and Penicillins   Review of Systems Review of Systems  Constitutional: Negative for fever.  Eyes: Negative for photophobia and visual disturbance.  Respiratory: Negative for cough, shortness of breath and wheezing.   Cardiovascular: Positive for chest pain and palpitations. Negative for leg swelling.  Gastrointestinal: Positive for nausea. Negative for abdominal pain, diarrhea and vomiting.  Musculoskeletal: Positive for arthralgias and back pain.  Neurological: Positive  for dizziness (chronic), numbness and headaches. Negative for syncope and weakness.  All other systems reviewed and are negative.    Physical Exam Updated Vital Signs BP (!) 156/98   Pulse 76   Temp 98.3 F (36.8 C) (Oral)   Resp 14   Ht 5\' 3"  (1.6 m)   Wt 117.9 kg   SpO2 96%   BMI 46.06 kg/m   Physical Exam Vitals signs and nursing note reviewed.  Constitutional:      General: She is not in acute distress.    Appearance: She is well-developed. She is not ill-appearing.     Comments: Calm, cooperative. Pleasant  HENT:     Head: Normocephalic and atraumatic.  Eyes:     General: No scleral icterus.       Right eye: No discharge.        Left  eye: No discharge.     Conjunctiva/sclera: Conjunctivae normal.     Pupils: Pupils are equal, round, and reactive to light.  Neck:     Musculoskeletal: Normal range of motion.  Cardiovascular:     Rate and Rhythm: Normal rate and regular rhythm.     Heart sounds: Normal heart sounds.  Pulmonary:     Effort: Pulmonary effort is normal. No respiratory distress.     Breath sounds: Normal breath sounds.  Chest:     Chest wall: Tenderness (of left chest wall and left back) present.  Abdominal:     General: There is no distension.     Palpations: Abdomen is soft.     Tenderness: There is no abdominal tenderness.  Skin:    General: Skin is warm and dry.  Neurological:     Mental Status: She is alert and oriented to person, place, and time.     Comments: Lying on stretcher in NAD. GCS 15. Speaks in a clear voice. Cranial nerves II through XII grossly intact. Subjective numbness of the left side of face. 5/5 strength in all extremities. Sensation fully intact.  Bilateral finger-to-nose intact. Ambulatory   Psychiatric:        Behavior: Behavior normal.      ED Treatments / Results  Labs (all labs ordered are listed, but only abnormal results are displayed) Labs Reviewed  CBC - Abnormal; Notable for the following components:       Result Value   RBC 5.21 (*)    MCH 25.3 (*)    RDW 16.3 (*)    All other components within normal limits  BASIC METABOLIC PANEL  TSH  I-STAT TROPONIN, ED  I-STAT BETA HCG BLOOD, ED ([facility], [facility], [facility] ONLY)  POCT I-STAT TROPONIN I  I-STAT BETA HCG BLOOD, ED (NOT ORDERABLE)  I-STAT TROPONIN, ED  POCT I-STAT TROPONIN I    EKG None  Radiology Dg Chest 2 View  Result Date: 06/02/2018 CLINICAL DATA:  Chest pain EXAM: CHEST - 2 VIEW COMPARISON:  Oct 04, 2016 FINDINGS: Lungs are clear. Heart size and pulmonary vascularity are normal. No adenopathy. No bone lesions. No pneumothorax. IMPRESSION: No edema or consolidation. Electronically Signed   By: Lowella Grip III M.D.   On: 06/02/2018 15:12    Procedures Procedures (including critical care time)  Medications Ordered in ED Medications  sodium chloride flush (NS) 0.9 % injection 3 mL (3 mLs Intravenous Not Given 06/02/18 1850)     Initial Impression / Assessment and Plan / ED Course  I have reviewed the triage vital signs and the nursing notes.  Pertinent labs & imaging results that were available during my care of the patient were reviewed by me and considered in my medical decision making (see chart for details).  53 year old female with palpitations and chest pain. She is hypertensive here but otherwise vitals are normal. Heart is regular rate and rhythm. It's unclear what she is feeling as I do not note any arrhythmias. She has some PACs which it's possible this is what she is feeling but they are not very regular and she states the feeling of the palpitations is all the time. She has had chest pain that started today as well. Chest pain work up is reassuring. Doubt ACS, PE, pericarditis, esophageal rupture, tension pneumothorax, aortic dissection, cardiac tamponade. Pain is reproducible with palpation. CXR is negative. Initial and second troponin is 0. Labs are unremarkable. TSH was added on. No significant past or family  hx of cardiac  disease. Patient is non-smoker. HEART score is 2. She also mentions a headache. It feels like a migraine. Her neurologic exam is normal. She cant take OTC meds for this. She has a f/u with her PCP tomorrow. Advised f/u and return if worsening   Final Clinical Impressions(s) / ED Diagnoses   Final diagnoses:  Chest pain, unspecified type  Acute nonintractable headache, unspecified headache type  Palpitations    ED Discharge Orders    None       Recardo Evangelist, PA-C 06/02/18 2114    Recardo Evangelist, PA-C 06/02/18 2114    Carmin Muskrat, MD 06/02/18 [phone]

## 2018-06-03 ENCOUNTER — Ambulatory Visit (INDEPENDENT_AMBULATORY_CARE_PROVIDER_SITE_OTHER): Payer: PPO | Admitting: Family Medicine

## 2018-06-03 ENCOUNTER — Encounter: Payer: Self-pay | Admitting: Family Medicine

## 2018-06-03 VITALS — BP 108/76 | HR 79 | Temp 99.1°F | Wt 257.2 lb

## 2018-06-03 DIAGNOSIS — M940 Chondrocostal junction syndrome [Tietze]: Secondary | ICD-10-CM

## 2018-06-03 DIAGNOSIS — R002 Palpitations: Secondary | ICD-10-CM

## 2018-06-03 DIAGNOSIS — R079 Chest pain, unspecified: Secondary | ICD-10-CM

## 2018-06-03 MED ORDER — METHYLPREDNISOLONE ACETATE 80 MG/ML IJ SUSP
120.0000 mg | Freq: Once | INTRAMUSCULAR | Status: AC
  Administered 2018-06-03: 120 mg via INTRAMUSCULAR

## 2018-06-03 NOTE — Progress Notes (Signed)
   Subjective:    Patient ID: [name], female    DOB: [birth year], 53 y.o.   MRN: [id number]  HPI Here to follow up an ER visit yesterday for chest pains and palpitations. No SOB. At the ER her labs were normal. EKG was normal except for some PACs. Her chest pains were felt to be non-cardiac in nature. Today she still has some chest pains but they have let up a bit. No SOB. The palpitations have stopped.    Review of Systems  Constitutional: Negative.   Respiratory: Negative.   Cardiovascular: Positive for chest pain. Negative for palpitations and leg swelling.  Gastrointestinal: Negative.   Neurological: Negative.        Objective:   Physical Exam Constitutional:      General: She is not in acute distress.    Appearance: Normal appearance.  Neck:     Musculoskeletal: Normal range of motion.  Cardiovascular:     Rate and Rhythm: Normal rate and regular rhythm.     Pulses: Normal pulses.     Heart sounds: Normal heart sounds.  Pulmonary:     Effort: Pulmonary effort is normal.     Breath sounds: Normal breath sounds.     Comments: She is tender along both sternal margins  Abdominal:     General: Abdomen is flat. Bowel sounds are normal. There is no distension.     Palpations: Abdomen is soft. There is no mass.     Tenderness: There is no abdominal tenderness. There is no guarding or rebound.     Hernia: No hernia is present.  Lymphadenopathy:     Cervical: No cervical adenopathy.  Neurological:     General: No focal deficit present.     Mental Status: She is oriented to person, place, and time.           Assessment & Plan:  Her chest [place] is due to costochondritis, and the palpitations were benign PACs. We discussed this today. She is given a steroid shot and she may take Ibuprofen at home. Recheck prn.  Alysia Penna, MD

## 2018-06-14 ENCOUNTER — Ambulatory Visit: Payer: PPO

## 2018-06-16 ENCOUNTER — Ambulatory Visit (INDEPENDENT_AMBULATORY_CARE_PROVIDER_SITE_OTHER): Payer: PPO | Admitting: *Deleted

## 2018-06-16 DIAGNOSIS — E538 Deficiency of other specified B group vitamins: Secondary | ICD-10-CM | POA: Diagnosis not present

## 2018-06-16 MED ORDER — CYANOCOBALAMIN 1000 MCG/ML IJ SOLN
1000.0000 ug | Freq: Once | INTRAMUSCULAR | Status: AC
  Administered 2018-06-16: 1000 ug via INTRAMUSCULAR

## 2018-06-16 NOTE — Progress Notes (Addendum)
Per orders of Dr. Fry, injection of B 12 given by Ryman Rathgeber S Sunya Humbarger. Patient tolerated injection well.  

## 2018-06-23 ENCOUNTER — Ambulatory Visit: Payer: PPO | Admitting: Internal Medicine

## 2018-06-23 ENCOUNTER — Encounter: Payer: Self-pay | Admitting: Internal Medicine

## 2018-06-23 VITALS — BP 158/98 | HR 84 | Ht 63.0 in | Wt 260.6 lb

## 2018-06-23 DIAGNOSIS — G35 Multiple sclerosis: Secondary | ICD-10-CM | POA: Diagnosis not present

## 2018-06-23 DIAGNOSIS — I491 Atrial premature depolarization: Secondary | ICD-10-CM | POA: Diagnosis not present

## 2018-06-23 DIAGNOSIS — K219 Gastro-esophageal reflux disease without esophagitis: Secondary | ICD-10-CM | POA: Diagnosis not present

## 2018-06-23 MED ORDER — FAMOTIDINE 40 MG/5ML PO SUSR: 40.0000 mg | Freq: Every day | ORAL | 1 refills | Status: DC

## 2018-06-23 NOTE — Progress Notes (Signed)
OFFICE NOTE  Chief Complaint:  ER follow-up palpitations  Primary Care Physician: Laurey Morale, MD  HPI:  [name] is a 53 y.o. female with a past medial history significant for anemia, headaches, hypertension and MS.  She was recently seen in the ER for palpitations.  EKG demonstrated PACs.  She said that been happening a lot more frequently.  She has been working on losing weight.  I asked her about her diet and lifestyle choices.  She apparently drinks a lot of caffeine.  She also eats chocolates and reports being under a lot of stress.  Her sleep is poor at night.  All these are risk factors for her PACs.  She denies any chest pain with this.  She was not given any medications for treatment.  When I discussed possible treatments with medications she was not interested.  No concerning features such as chest pain or worsening shortness of breath.  No significant family history of coronary disease.  She also has a history of GERD.  Apparently she saw gastroenterologist at [facility] who put her on liquid Pepcid, but said she could not use it after a long period of time because it could cause "problems with her esophagus".  PMHx:  Past Medical History:  Diagnosis Date  . Anemia   . GERD 12/30/2006  . Headache(784.0)   . Hypertension   . Multiple sclerosis ([place])   . PARESTHESIA 12/30/2006  . Seizures ([place])   . Stroke [facility])     Past Surgical History:  Procedure Laterality Date  . ANAL INTRAEPITHELIAL NEOPLASIA EXCISION    . BREAST BIOPSY    . CESAREAN SECTION    . EYE SURGERY Right   . Myometomy    . TONSILLECTOMY      FAMHx:  Family History  Problem Relation Age of Onset  . Dementia Mother   . Hypertension Mother   . Diabetes Mother   . Stroke Father   . Hypertension Father   . Psychosis Sister   . Hypertension Other   . Diabetes Other   . Dementia Other   . Dementia Maternal Grandmother   . Stroke Maternal Grandmother     SOCHx:   reports that she has  never smoked. She has never used smokeless tobacco. She reports that she does not drink alcohol or use drugs.  ALLERGIES:  Allergies  Allergen Reactions  . Banana Swelling    Lips, tongue and face  . Sulfacetamide Sodium Other (See Comments)    Syncope episode and was incoherent  . Penicillins Diarrhea and Nausea And Vomiting    Has patient had a PCN reaction causing immediate rash, facial/tongue/throat swelling, SOB or lightheadedness with hypotension: Yes Has patient had a PCN reaction causing severe rash involving mucus membranes or skin necrosis: No Has patient had a PCN reaction that required hospitalization No Has patient had a PCN reaction occurring within the last 10 years: No If all of the above answers are "NO", then may proceed with Cephalosporin use.     ROS: Pertinent items noted in HPI and remainder of comprehensive ROS otherwise negative.  HOME MEDS: Current Outpatient Medications on File Prior to Visit  Medication Sig Dispense Refill  . acetaminophen (TYLENOL) 160 MG/5ML solution Take 320 mg by mouth 2 (two) times daily as needed for mild pain. Reported on 07/21/2015    . Alemtuzumab (LEMTRADA IV) Inject into the vein 5 days. Every September ( yearly )    . cetirizine (ZYRTEC) 10 MG  tablet Take 10 mg by mouth at bedtime.    . cholecalciferol (VITAMIN D) 1000 units tablet Take 1,000 Units by mouth daily.    . citalopram (CELEXA) 20 MG tablet Take 20 mg by mouth daily.     . cyclobenzaprine (FLEXERIL) 10 MG tablet Take 10 mg by mouth 2 (two) times daily as needed for muscle spasms.     . diphenhydrAMINE (BENADRYL) 12.5 MG/5ML elixir Take 25 mg by mouth 4 (four) times daily as needed for allergies.    . furosemide (LASIX) 40 MG tablet Take 1 tablet (40 mg total) by mouth daily as needed for fluid. 60 tablet 2  . gabapentin (NEURONTIN) 300 MG capsule Take 300 mg by mouth 3 (three) times daily.    . hydroxypropyl methylcellulose / hypromellose (ISOPTO TEARS / GONIOVISC) 2.5  % ophthalmic solution Place 1 drop into both eyes 4 (four) times daily as needed for dry eyes. 15 mL 12  . hydrOXYzine (ATARAX/VISTARIL) 25 MG tablet TAKE 1 TABLET (25 MG TOTAL) BY MOUTH 3 (THREE) TIMES DAILY AS NEEDED FOR ITCHING. 60 tablet 0  . ibuprofen (ADVIL,MOTRIN) 200 MG tablet Take 200 mg by mouth every 8 (eight) hours as needed.    . levETIRAcetam (KEPPRA) 100 MG/ML solution Take 500 mg by mouth 2 (two) times daily.  3  . meloxicam (MOBIC) 15 MG tablet Take 1 tablet (15 mg total) by mouth daily. 90 tablet 3  . neomycin-polymyxin-hydrocortisone (CORTISPORIN) 3.5-10000-1 ophthalmic suspension Place 2 drops into both eyes every 4 (four) hours. 7.5 mL 0  . Oxcarbazepine (TRILEPTAL) 300 MG tablet Take 300 mg by mouth 2 (two) times daily.     . predniSONE (DELTASONE) 20 MG tablet Take 1 tablet (20 mg total) by mouth 2 (two) times daily with a meal. 20 tablet 0  . tizanidine (ZANAFLEX) 2 MG capsule Take 2 mg by mouth 2 (two) times daily.     Marland Kitchen triamcinolone cream (KENALOG) 0.1 % Apply 1 application topically 2 (two) times daily. 30 g 0   No current facility-administered medications on file prior to visit.     LABS/IMAGING: No results found for this or any previous visit (from the past 48 hour(s)). No results found.  LIPID PANEL:    Component Value Date/Time   CHOL 216 (H) 11/09/2016 1103   TRIG 79.0 11/09/2016 1103   HDL 63.00 11/09/2016 1103   CHOLHDL 3 11/09/2016 1103   VLDL 15.8 11/09/2016 1103   LDLCALC 137 (H) 11/09/2016 1103     WEIGHTS: Wt Readings from Last 3 Encounters:  06/23/18 260 lb 9.6 oz (118.2 kg)  06/03/18 257 lb 4 oz (116.7 kg)  06/02/18 260 lb (117.9 kg)    VITALS: BP (!) 158/98   Pulse 84   Ht 5\' 3"  (1.6 m)   Wt 260 lb 9.6 oz (118.2 kg)   BMI 46.16 kg/m   EXAM: General appearance: alert and no distress Neck: no carotid bruit, no JVD and thyroid not enlarged, symmetric, no tenderness/mass/nodules Lungs: clear to auscultation bilaterally Heart:  regular rate and rhythm Abdomen: soft, non-tender; bowel sounds normal; no masses,  no organomegaly Extremities: extremities normal, atraumatic, no cyanosis or edema Pulses: 2+ and symmetric Skin: Skin color, texture, turgor normal. No rashes or lesions Neurologic: Grossly normal : Pleasant  EKG: Rhythm with PACs at 78 (hospital EKG)-personally reviewed  ASSESSMENT: 1. Palpitations/PACs 2. Multiple sclerosis 3. GERD  PLAN: 1.   Ms. Antonieta Iba has been having palpitations which landed her in the ER.  She  was found to have PACs.  I suspect these may improve with significant dietary changes including reducing caffeine, stress, improving sleep quality and other things such as weight loss.  In addition, given her weight and risk factors, there may be underlying sleep disorder such as apnea.  I encouraged her to consider sleep testing.  At this point, would not recommend addition of medication such as beta-blocker however if her symptoms persist or worsen we could consider that treatment.  She can contact us for that as needed.  Mildly, she has been having worsening reflux symptoms.  She was successfully treated with liquid famotidine by a gastroenterologist at [facility], but it was suggested that long-term use would be detrimental to her esophagus.  This is unusual to me.  I will refer her to Dr. Benson Norway with GI for further evaluation and management locally for her reflux symptoms.  Pixie Casino, MD, [facility], [place] Director of the Advanced Lipid Disorders &  Cardiovascular Risk Reduction Clinic Diplomate of the American Board of Clinical Lipidology Attending Cardiologist  Direct Dial: [phone]  Fax: [phone]  Website:  [email or web address]   Nadean Corwin  06/23/2018, 5:25 PM

## 2018-06-23 NOTE — Patient Instructions (Signed)
Medication Instructions:  Continue current medications  If you need a refill on your cardiac medications before your next appointment, please call your pharmacy.  Labwork: None Ordered   Take the provided lab slips with you to the lab for your blood draw.   When you have your labs (blood work) drawn today and your tests are completely normal, you will receive your results only by MyChart Message (if you have MyChart) -OR-  A paper copy in the mail.  If you have any lab test that is abnormal or we need to change your treatment, we will call you to review these results.  Testing/Procedures: None Ordered  Follow-Up: You have been referred to Dr Benson Norway  Your physician recommends that you schedule a follow-up appointment in: As Needed   At [facility], you and your health needs are our priority.  As part of our continuing mission to provide you with exceptional heart care, we have created designated Provider Care Teams.  These Care Teams include your primary Cardiologist (physician) and Advanced Practice Providers (APPs -  Physician Assistants and Nurse Practitioners) who all work together to provide you with the care you need, when you need it.  Thank you for choosing [facility] at [facility]!!

## 2018-07-19 DIAGNOSIS — Z6841 Body Mass Index (BMI) 40.0 and over, adult: Secondary | ICD-10-CM | POA: Diagnosis not present

## 2018-07-19 DIAGNOSIS — Z124 Encounter for screening for malignant neoplasm of cervix: Secondary | ICD-10-CM | POA: Diagnosis not present

## 2018-07-19 DIAGNOSIS — Z01419 Encounter for gynecological examination (general) (routine) without abnormal findings: Secondary | ICD-10-CM | POA: Diagnosis not present

## 2018-07-19 DIAGNOSIS — N959 Unspecified menopausal and perimenopausal disorder: Secondary | ICD-10-CM | POA: Diagnosis not present

## 2018-07-25 DIAGNOSIS — Z1211 Encounter for screening for malignant neoplasm of colon: Secondary | ICD-10-CM | POA: Diagnosis not present

## 2018-07-25 DIAGNOSIS — G35 Multiple sclerosis: Secondary | ICD-10-CM | POA: Diagnosis not present

## 2018-07-25 DIAGNOSIS — R1310 Dysphagia, unspecified: Secondary | ICD-10-CM | POA: Diagnosis not present

## 2018-07-25 DIAGNOSIS — D509 Iron deficiency anemia, unspecified: Secondary | ICD-10-CM | POA: Diagnosis not present

## 2018-08-02 ENCOUNTER — Other Ambulatory Visit: Payer: Self-pay | Admitting: Internal Medicine

## 2018-08-02 NOTE — Telephone Encounter (Signed)
New Message    *STAT* If patient is at the pharmacy, call can be transferred to refill team.   1. Which medications need to be refilled? (please list name of each medication and dose if known) Famotidine 40mg /5ML  2. Which pharmacy/location (including street and city if local pharmacy) is medication to be sent to? [facility] [address]  3. Do they need a 30 day or 90 day supply? 30 day supply

## 2018-08-03 MED ORDER — FAMOTIDINE 40 MG/5ML PO SUSR: 40.0000 mg | Freq: Every day | ORAL | 1 refills | Status: DC

## 2018-08-15 ENCOUNTER — Telehealth: Payer: Self-pay | Admitting: *Deleted

## 2018-08-15 NOTE — Telephone Encounter (Signed)
Copied from [place] [id number]. Topic: General - Other >> Aug 11, 2018  1:07 PM Ivar Drape wrote: Reason for CRM:   Patient would like a call back from a Medical Assistant today.  She would not say what she wanted.   This was in regard to her husband needing samples of eliquis.  Opened a phone note under his name.

## 2018-08-16 ENCOUNTER — Telehealth: Payer: Self-pay

## 2018-08-16 NOTE — Telephone Encounter (Signed)
Author phoned pt. to assess interest in scheduling virtual awv. Pt. stated she was not interested at this time to do awv with author or PCP at this time. Pt. stated that her question from 3/30 has been answered already by office, no further needs at this time.

## 2018-08-17 ENCOUNTER — Other Ambulatory Visit: Payer: Self-pay | Admitting: Internal Medicine

## 2018-09-28 ENCOUNTER — Other Ambulatory Visit: Payer: Self-pay | Admitting: Internal Medicine

## 2018-10-19 DIAGNOSIS — R42 Dizziness and giddiness: Secondary | ICD-10-CM | POA: Diagnosis not present

## 2018-10-19 DIAGNOSIS — M545 Low back pain: Secondary | ICD-10-CM | POA: Diagnosis not present

## 2018-10-19 DIAGNOSIS — E882 Lipomatosis, not elsewhere classified: Secondary | ICD-10-CM | POA: Diagnosis not present

## 2018-10-19 DIAGNOSIS — G35 Multiple sclerosis: Secondary | ICD-10-CM | POA: Diagnosis not present

## 2018-10-19 DIAGNOSIS — G379 Demyelinating disease of central nervous system, unspecified: Secondary | ICD-10-CM | POA: Diagnosis not present

## 2018-10-19 DIAGNOSIS — Z79899 Other long term (current) drug therapy: Secondary | ICD-10-CM | POA: Diagnosis not present

## 2018-10-27 DIAGNOSIS — M533 Sacrococcygeal disorders, not elsewhere classified: Secondary | ICD-10-CM | POA: Diagnosis not present

## 2018-10-27 DIAGNOSIS — M5136 Other intervertebral disc degeneration, lumbar region: Secondary | ICD-10-CM | POA: Diagnosis not present

## 2018-10-27 DIAGNOSIS — M47816 Spondylosis without myelopathy or radiculopathy, lumbar region: Secondary | ICD-10-CM | POA: Diagnosis not present

## 2018-11-16 ENCOUNTER — Other Ambulatory Visit: Payer: Self-pay | Admitting: Internal Medicine

## 2018-11-28 DIAGNOSIS — M1611 Unilateral primary osteoarthritis, right hip: Secondary | ICD-10-CM | POA: Diagnosis not present

## 2018-12-09 ENCOUNTER — Other Ambulatory Visit: Payer: Self-pay

## 2018-12-09 ENCOUNTER — Encounter: Payer: Self-pay | Admitting: Rehabilitative and Restorative Service Providers"

## 2018-12-09 ENCOUNTER — Ambulatory Visit: Payer: PPO | Attending: Rehabilitation | Admitting: Rehabilitative and Restorative Service Providers"

## 2018-12-09 DIAGNOSIS — M25551 Pain in right hip: Secondary | ICD-10-CM | POA: Diagnosis not present

## 2018-12-09 DIAGNOSIS — M5416 Radiculopathy, lumbar region: Secondary | ICD-10-CM | POA: Diagnosis not present

## 2018-12-09 DIAGNOSIS — R2689 Other abnormalities of gait and mobility: Secondary | ICD-10-CM | POA: Diagnosis not present

## 2018-12-09 DIAGNOSIS — M25552 Pain in left hip: Secondary | ICD-10-CM

## 2018-12-10 NOTE — Therapy (Signed)
[facility] [address] [place], [place], [address] Phone: [phone]   Fax:  [phone]  Physical Therapy Evaluation  Patient Details  Name: [name] MRN: [id number] Date of Birth: August 29, 1965 Referring Provider (PT): Cira Servant, DO   CLINIC OPERATION CHANGES: Outpatient Neuro Rehab is open at lower capacity following universal masking, social distancing, and patient screening.  The patient's COVID risk of complications score is 2.   Encounter Date: 12/09/2018  PT End of Session - 12/09/18 1552    Visit Number  1    Number of Visits  17    Date for PT Re-Evaluation  02/07/19    PT Start Time  1503    PT Stop Time  1546    PT Time Calculation (min)  43 min    Activity Tolerance  Patient tolerated treatment well    Behavior During Therapy  [facility] for tasks assessed/performed       Past Medical History:  Diagnosis Date  . Anemia   . GERD 12/30/2006  . Headache(784.0)   . Hypertension   . Multiple sclerosis ([place])   . PARESTHESIA 12/30/2006  . Seizures ([place])   . Stroke [facility])     Past Surgical History:  Procedure Laterality Date  . ANAL INTRAEPITHELIAL NEOPLASIA EXCISION    . BREAST BIOPSY    . CESAREAN SECTION    . EYE SURGERY Right   . Myometomy    . TONSILLECTOMY      There were no vitals filed for this visit.   Subjective Assessment - 12/09/18 1506    Subjective  The patient reports onset of back pain in 2008 when dx with MS.  She describes pain as worsening and excruciating when she is up on her feet.  She can no longer sleep in the bed and is staying in the recliner.  She describes shooting pain down L leg and SI joint pain R leg.  She notes even before 2008 she remembers having low back pain when lying down.    Pertinent History  h/o MS since 2008, seizures, mini stroke per patient.  *Was going to [facility] prior to covid closure*    Patient Stated Goals  Reduce hip pain, back pain,  improve walking.   Patient also has L shoulder pain.    Currently in Pain?  Yes   began hurting in 2008 since MS diagnosis   Pain Score  3     Pain Location  Back    Pain Orientation  Lower    Pain Descriptors / Indicators  Aching;Numbness    Pain Type  Chronic pain    Pain Radiating Towards  radiates down the left leg; occasion R groin pain.    Pain Onset  More than a month ago    Pain Frequency  Constant    Aggravating Factors   When up on her feet "in excruciating pain"; walking, and at times when sitting.  "I can't lay in the bed.  I sleep in the recliner."    Pain Relieving Factors  resting    Effect of Pain on Daily Activities  "I've been having therapy for my whole body since getting diagnosis for MS".  My PT doesn't take my insurance.         [facility] PT Assessment - 12/09/18 1514      Assessment   Medical Diagnosis  lumbar spondylosis, DDD, SI joint pain    Referring Provider (PT)  Cira Servant, DO  Onset Date/Surgical Date  10/27/18    Prior Therapy  has prior PT in [place], but had insurance change and had to change providers      Precautions   Precautions  Fall      Restrictions   Weight Bearing Restrictions  No      Balance Screen   Has the patient fallen in the past 6 months  Yes    How many times?  15 times; "I notice I'm falling more."    Has the patient had a decrease in activity level because of a fear of falling?   Yes    Is the patient reluctant to leave their home because of a fear of falling?   No      Home Environment   Living Environment  Private residence    Living Arrangements  Alone   patient is separated from spouse/ he checks on her   Type of [place] to enter    Home Layout  Two level   laundry upstairs   Alternate Level Stairs-Number of Steps  [place] - single point    Additional Comments  uses cane outdoors, furniture in the house      Prior Function   Level of [place]  with household mobility with device      Sensation   Light Touch  Appears Intact    Additional Comments  Feet are numb x 12 years; legs are heavy today from [place].      Posture/Postural Control   Posture/Postural Control  Postural limitations    Postural Limitations  Increased lumbar lordosis;Rounded Shoulders;Forward head      ROM / Strength   AROM / PROM / Strength  AROM;Strength      AROM   Overall AROM   --    Overall AROM Comments  Pain with end range forward flexion and prone extension      Strength   Overall Strength Comments  AROM UEs:  4/5 bilateral shoulder flexion, 5/5 bilateral shoudler abduction, 5/5 bilateral elbow flexion, 4/5 bilateral hip flexion, 5/5 bilateral knee flexion/extension, 4/5 bilateral ankle DF.      Palpation   SI assessment   Sore with L SI border palpation, significant pain palpating along R SI border, pain with soft tissue palpation lateral to SI joint.  Unable to palpate spinous processes well due to adipose tissue.      Palpation comment  Pain with R hip bringing knee to chest in groin and SI joint, able to tolerate L knee to chest without pain.        Special Tests    Special Tests  Lumbar;Sacrolliac Tests;Hip Special Tests    Lumbar Tests  Straight Leg Raise      Straight Leg Raise   Comment  Pain when lowering leg in R groin and R SI joint      Ambulation/Gait   Ambulation/Gait  Yes    Ambulation/Gait Assistance  7: Independent    Assistive device  None    Gait Pattern  Antalgic    Ambulation Surface  Level;Indoor    Gait Comments  2 minutes of gait; pain increases from 3/10 up to 6/10 and patient gets R antalgic pain with R hip, groin, SI pain.                  Objective measurements completed on examination: See above findings.  PT Short Term Goals - 12/09/18 2103      PT SHORT TERM GOAL #1   Title  The patient will be independent with HEP for core stabilization, stretching, postural strengthening.     Time  4    Period  Weeks    Target Date  01/08/19      PT SHORT TERM GOAL #2   Title  The patient will report pain < or equal to 2/10 with gait x 3 minutes to demonstrate improved standing/activity tolerance.    Baseline  6/10 with gait x 2 minutes.    Time  4    Period  Weeks    Target Date  01/08/19      PT SHORT TERM GOAL #3   Title  The patient will perform forward flexion without pain at end range.    Time  4    Period  Weeks    Target Date  01/08/19      PT SHORT TERM GOAL #4   Title  The patient will return to sleeping in her bed versus recliner.    Time  4    Target Date  01/08/19        PT Long Term Goals - 12/09/18 2116      PT LONG TERM GOAL #1   Title  The patient will verbalize wellness activities for long term activity.    Time  8    Period  Weeks    Target Date  02/07/19      PT LONG TERM GOAL #2   Title  The patient will ambulate for 10 minutes with pain < or equal to 2/10.    Baseline  6/10 with 2 minutes of gait    Time  8    Period  Weeks    Target Date  02/07/19      PT LONG TERM GOAL #3   Title  The patient will perform SLR x 10 reps R and L sides without pain.    Time  8    Period  Weeks    Target Date  02/07/19      PT LONG TERM GOAL #4   Title  The patient will demonstrate self mgmt techniques for low back pain mgmt.    Time  8    Period  Weeks    Target Date  02/07/19             Plan - 12/10/18 1417    Clinical Impression Statement  The patient is a 53 year old female with chronic history of LBP prior to 2008, as well as dx with MS in 2008.  She has postural abnormalities of increased lordosis lumbar spine, dec'd core activation.  Her pain occurs in R SI joint, R groin, and L posterior glut occasionally radiating distally to the thigh.  Pain is made worse by end range flexion, extension and supine positioning. Pain is most aggravated during standing and WB tasks.  In addition to lumbar pain, dec'd tolerance to standing, the  patient also has frequent falls with intermittent LE weakness.    Personal Factors and Comorbidities  Comorbidity 1;Comorbidity 2    Comorbidities  multiple sclerosis, obesity    Examination-Activity Limitations  Bed Mobility;Sleep;Locomotion Level;Stairs    Examination-Participation Restrictions  Cleaning;Community Activity;Interpersonal Relationship;Laundry    Stability/Clinical Decision Making  Evolving/Moderate complexity    Clinical Decision Making  Moderate    Rehab Potential  Good    PT Frequency  2x / week  PT Duration  8 weeks    PT Treatment/Interventions  ADLs/Self Care Home Management;Neuromuscular re-education;Patient/family education;Manual techniques;Balance training;Therapeutic exercise;Therapeutic activities;Functional mobility training;Gait training;Passive range of motion;Spinal Manipulations;Dry needling    PT Next Visit Plan  Check HEP, progress core activation activities, provide positioning for sleeping in her bed, gait activities, R hip distraction.    Consulted and Agree with Plan of Care  Patient       Patient will benefit from skilled therapeutic intervention in order to improve the following deficits and impairments:  Abnormal gait, Decreased balance, Pain, Obesity, Decreased activity tolerance, Decreased strength, Impaired flexibility, Postural dysfunction  Visit Diagnosis: 1. Radiculopathy, lumbar region   2. Other abnormalities of gait and mobility   3. Bilateral hip pain        Problem List Patient Active Problem List   Diagnosis Date Noted  . Low back pain due to bilateral sciatica 02/22/2018  . Essential hypertension 05/30/2015  . Right-sided chest pain 06/27/2014  . Microcytic anemia 06/27/2014  . Multiple sclerosis exacerbation ([place]) 06/27/2014  . Obesity (BMI 30-39.9) 06/27/2014  . B12 deficiency 06/06/2013  . Multiple sclerosis ([place]) 08/12/2010  . GERD 12/30/2006  . PARESTHESIA 12/30/2006    [name], PT 12/10/2018, 2:25  PM  [facility] [address] [place], [place], [address] Phone: [phone]   Fax:  [phone]  Name: [name] MRN: [id number] Date of Birth: November 05, 1965

## 2018-12-14 ENCOUNTER — Other Ambulatory Visit: Payer: Self-pay

## 2018-12-14 ENCOUNTER — Ambulatory Visit: Payer: PPO | Admitting: Rehabilitative and Restorative Service Providers"

## 2018-12-14 DIAGNOSIS — M5416 Radiculopathy, lumbar region: Secondary | ICD-10-CM | POA: Diagnosis not present

## 2018-12-14 DIAGNOSIS — R2689 Other abnormalities of gait and mobility: Secondary | ICD-10-CM

## 2018-12-14 DIAGNOSIS — M25552 Pain in left hip: Secondary | ICD-10-CM

## 2018-12-14 DIAGNOSIS — M25551 Pain in right hip: Secondary | ICD-10-CM

## 2018-12-14 NOTE — Patient Instructions (Signed)
Access Code: [email or web address]  URL: [email or web address]  Date: 12/14/2018  Prepared by: Rudell Cobb   Exercises Side Lunge Adductor Stretch - 3 reps - 1 sets - 20 seconds hold - 2x daily - 7x weekly Supine Posterior Pelvic Tilt - 10 reps - 1 sets - 3-5 hold - 2x daily - 7x weekly Quadruped Cat Camel - 10 reps - 1 sets - 2x daily - 7x weekly Quadruped Rocking Slow - 10 reps - 1 sets - 2x daily - 7x weekly

## 2018-12-15 NOTE — Therapy (Signed)
[facility] [address] [place], [place], [address] Phone: [phone]   Fax:  [phone]  Physical Therapy Treatment  Patient Details  Name: [name] MRN: [id number] Date of Birth: 12-16-1965 Referring Provider (PT): Cira Servant, DO  CLINIC OPERATION CHANGES: Outpatient Neuro Rehab is open at lower capacity following universal masking, social distancing, and patient screening.  The patient's COVID risk of complications score is 2.  Encounter Date: 12/14/2018  PT End of Session - 12/14/18 1513    Visit Number  2    Number of Visits  17    Date for PT Re-Evaluation  02/07/19    Authorization Type  health team advantage    PT Start Time  0905    PT Stop Time  0950    PT Time Calculation (min)  45 min    Activity Tolerance  Patient tolerated treatment well    Behavior During Therapy  [facility] for tasks assessed/performed       Past Medical History:  Diagnosis Date  . Anemia   . GERD 12/30/2006  . Headache(784.0)   . Hypertension   . Multiple sclerosis ([place])   . PARESTHESIA 12/30/2006  . Seizures ([place])   . Stroke [facility])     Past Surgical History:  Procedure Laterality Date  . ANAL INTRAEPITHELIAL NEOPLASIA EXCISION    . BREAST BIOPSY    . CESAREAN SECTION    . EYE SURGERY Right   . Myometomy    . TONSILLECTOMY      There were no vitals filed for this visit.  Subjective Assessment - 12/14/18 0906    Subjective  The patient was sore after evaluation x days.  She notes her R groin has hurt for > 30 years stating "it will lock up."    Pertinent History  h/o MS since 2008, seizures, mini stroke per patient.  *Was going to [facility] prior to covid closure*    Patient Stated Goals  Reduce hip pain, back pain, improve walking.   Patient also has L shoulder pain.    Currently in Pain?  Yes    Pain Score  2     Pain Location  Back    Pain Orientation  Lower    Pain Descriptors / Indicators   Aching;Numbness    Pain Onset  More than a month ago    Pain Frequency  Constant    Aggravating Factors   walking    Pain Relieving Factors  resting                       [facility] Adult PT Treatment/Exercise - 12/14/18 0907      Ambulation/Gait   Ambulation/Gait  Yes    Ambulation/Gait Assistance  7: Independent    Ambulation Distance (Feet)  345 Feet    Assistive device  None    Gait Pattern  Within Functional Limits;Antalgic    Ambulation Surface  Level;Indoor    Gait Comments  After stretching, the patient initially began walking with an antalgic pattern, however after 25 feet, she felt improved and was able to walk with normal speed, longer stride length and no evidence of antalgic pattern.  After 345 feet, she began to c/o pain (mild).        Exercises   Exercises  Lumbar;Knee/Hip      Lumbar Exercises: Stretches   Single Knee to Chest Stretch  Right    Single Knee to Chest Stretch Limitations  At  start of session, the patient could not tolerate R knee to chest (she could not get to 90 degrees hip flexion due to pain in R groin and R SI region).  At end of session, she tolerated right knee to chest with passive overpressure.      Lower Trunk Rotation  5 reps;10 seconds    Lower Trunk Rotation Limitations  Within tolerable ROM initially with feet narrow working on lumbar stretching and then with feet wider for hip stretching within tolerance.      Lumbar Exercises: Standing   Other Standing Lumbar Exercises  Reached to ground for lumbar forward flexion.  Initially, patient had pain when returning to stand.  After exercise, she felt improvement/reduction in pain with return to stand after 3 reps.       Lumbar Exercises: Supine   Pelvic Tilt  10 reps    Pelvic Tilt Limitations  With cues on technique    Bent Knee Raise  5 reps    Bent Knee Raise Limitations  c/o pain with R groin and R hip    Bridge  --   3 reps   Bridge Limitations  Stopped due to significant pain  in SI region and R groin with bridging.      Lumbar Exercises: Prone   Other Prone Lumbar Exercises  Rested in prone position for ROM of the lumbar spine into extension. x 2 minutes x 2 reps.      Knee/Hip Exercises: Stretches   Other Knee/Hip Stretches  Hip adductor stretch.      Manual Therapy   Manual Therapy  Joint mobilization;Soft tissue mobilization;Muscle Energy Technique    Manual therapy comments  Goal for manual therapy is to gain increased R hip mobility, reduce SI joint pain, reduce groin pain, improve function    Joint Mobilization  long axis distraction R LE (used massage table and braced pation in face cutout with L foot) x 3-4 minutes.    Soft tissue mobilization  Passive overpressure and soft tissue mobilization R adductors    Muscle Energy Technique  SI joint muscle energy techniques with R LE on PT's shoulder moving into extension and L LE pulling into flexion x 5 second holds x 8 reps             PT Education - 12/15/18 1513    Education Details  HEP in [place].    Person(s) Educated  Patient    Methods  Explanation;Demonstration;Handout    Comprehension  Verbalized understanding;Returned demonstration       PT Short Term Goals - 12/09/18 2103      PT SHORT TERM GOAL #1   Title  The patient will be independent with HEP for core stabilization, stretching, postural strengthening.    Time  4    Period  Weeks    Target Date  01/08/19      PT SHORT TERM GOAL #2   Title  The patient will report pain < or equal to 2/10 with gait x 3 minutes to demonstrate improved standing/activity tolerance.    Baseline  6/10 with gait x 2 minutes.    Time  4    Period  Weeks    Target Date  01/08/19      PT SHORT TERM GOAL #3   Title  The patient will perform forward flexion without pain at end range.    Time  4    Period  Weeks    Target Date  01/08/19  PT SHORT TERM GOAL #4   Title  The patient will return to sleeping in her bed versus recliner.    Time  4     Target Date  01/08/19        PT Long Term Goals - 12/09/18 2116      PT LONG TERM GOAL #1   Title  The patient will verbalize wellness activities for long term activity.    Time  8    Period  Weeks    Target Date  02/07/19      PT LONG TERM GOAL #2   Title  The patient will ambulate for 10 minutes with pain < or equal to 2/10.    Baseline  6/10 with 2 minutes of gait    Time  8    Period  Weeks    Target Date  02/07/19      PT LONG TERM GOAL #3   Title  The patient will perform SLR x 10 reps R and L sides without pain.    Time  8    Period  Weeks    Target Date  02/07/19      PT LONG TERM GOAL #4   Title  The patient will demonstrate self mgmt techniques for low back pain mgmt.    Time  8    Period  Weeks    Target Date  02/07/19            Plan - 12/14/18 0947    Clinical Impression Statement  The patient has significant increase in R hip ROM and reduced pain to 1/10 at end of therapy session today.   The patient's bilateral hip pain is worsened with palpation along sacral border. Groin pain is only present on the right side and improves with hip mobilization.  Manual techniques of the lumbar spine are limited due to the patient's body habitus.  PT to continue focusing on R hip as this limits gait activities and then shift to low back as R hip ROm and pain improves.    PT Treatment/Interventions  ADLs/Self Care Home Management;Neuromuscular re-education;Patient/family education;Manual techniques;Balance training;Therapeutic exercise;Therapeutic activities;Functional mobility training;Gait training;Passive range of motion;Spinal Manipulations;Dry needling    PT Next Visit Plan  R hip distraction, focus on R hip mobility and pain reduction to improve gait mechanics, check on progress with HEP, "smile away" lumbar mobilization, core activation activities.  Plan to slowly introduct modified squats as patient able to tolerate and introduce pain science over next 2-3 visits.     Recommended Other Services  Access Code: [email or web address]    Consulted and Agree with Plan of Care  Patient       Patient will benefit from skilled therapeutic intervention in order to improve the following deficits and impairments:  Abnormal gait, Decreased balance, Pain, Obesity, Decreased activity tolerance, Decreased strength, Impaired flexibility, Postural dysfunction  Visit Diagnosis: 1. Radiculopathy, lumbar region   2. Other abnormalities of gait and mobility   3. Bilateral hip pain        Problem List Patient Active Problem List   Diagnosis Date Noted  . Low back pain due to bilateral sciatica 02/22/2018  . Essential hypertension 05/30/2015  . Right-sided chest pain 06/27/2014  . Microcytic anemia 06/27/2014  . Multiple sclerosis exacerbation ([place]) 06/27/2014  . Obesity (BMI 30-39.9) 06/27/2014  . B12 deficiency 06/06/2013  . Multiple sclerosis ([place]) 08/12/2010  . GERD 12/30/2006  . PARESTHESIA 12/30/2006    Kyngston Pickelsimer, PT 12/15/2018, 3:42 PM  [facility] [address] [place], [place], [address] Phone: [phone]   Fax:  [phone]  Name: [name] MRN: [id number] Date of Birth: 1965-09-08

## 2018-12-19 ENCOUNTER — Ambulatory Visit: Payer: PPO | Admitting: Physical Therapy

## 2018-12-20 ENCOUNTER — Other Ambulatory Visit: Payer: Self-pay | Admitting: Obstetrics & Gynecology

## 2018-12-20 ENCOUNTER — Other Ambulatory Visit (HOSPITAL_COMMUNITY): Payer: Self-pay | Admitting: Obstetrics & Gynecology

## 2018-12-20 ENCOUNTER — Ambulatory Visit
Admission: RE | Admit: 2018-12-20 | Discharge: 2018-12-20 | Disposition: A | Discharge status: Home / Self Care | Payer: PPO | Source: Ambulatory Visit | Attending: Obstetrics & Gynecology | Admitting: Obstetrics & Gynecology

## 2018-12-20 DIAGNOSIS — R1031 Right lower quadrant pain: Secondary | ICD-10-CM

## 2018-12-20 DIAGNOSIS — N951 Menopausal and female climacteric states: Secondary | ICD-10-CM | POA: Diagnosis not present

## 2018-12-20 DIAGNOSIS — R259 Unspecified abnormal involuntary movements: Secondary | ICD-10-CM | POA: Diagnosis not present

## 2018-12-20 DIAGNOSIS — D250 Submucous leiomyoma of uterus: Secondary | ICD-10-CM | POA: Diagnosis not present

## 2018-12-20 LAB — HEPATIC FUNCTION PANEL
ALT: 10 (ref 7–35)
AST: 15 (ref 13–35)
Alkaline Phosphatase: 107 (ref 25–125)
Bilirubin, Total: 0.3

## 2018-12-20 LAB — CBC AND DIFFERENTIAL
HCT: 37 (ref 36–46)
Hemoglobin: 11.9 — AB (ref 12.0–16.0)
Platelets: 226 (ref 150–399)
WBC: 6.4

## 2018-12-20 LAB — BASIC METABOLIC PANEL
BUN: 12 (ref 4–21)
Glucose: 88
Potassium: 4 (ref 3.4–5.3)
Sodium: 141 (ref 137–147)

## 2018-12-21 ENCOUNTER — Ambulatory Visit: Payer: PPO | Attending: Rehabilitation | Admitting: Physical Therapy

## 2018-12-21 ENCOUNTER — Other Ambulatory Visit: Payer: Self-pay

## 2018-12-21 DIAGNOSIS — R2689 Other abnormalities of gait and mobility: Secondary | ICD-10-CM | POA: Insufficient documentation

## 2018-12-21 DIAGNOSIS — M5416 Radiculopathy, lumbar region: Secondary | ICD-10-CM | POA: Diagnosis not present

## 2018-12-21 DIAGNOSIS — M25551 Pain in right hip: Secondary | ICD-10-CM | POA: Diagnosis not present

## 2018-12-21 DIAGNOSIS — M25552 Pain in left hip: Secondary | ICD-10-CM | POA: Insufficient documentation

## 2018-12-21 NOTE — Therapy (Signed)
[place] [address] [place], [place], [address] Phone: [phone]   Fax:  [phone]  Physical Therapy Treatment  Patient Details  Name: [name] MRN: [id number] Date of Birth: 1965-10-28 Referring Provider (PT): Cira Servant, DO   Encounter Date: 12/21/2018  PT End of Session - 12/21/18 1108    Visit Number  3    Number of Visits  17    Date for PT Re-Evaluation  02/07/19    Authorization Type  health team advantage    PT Start Time  0940    PT Stop Time  1020    PT Time Calculation (min)  40 min    Activity Tolerance  Patient tolerated treatment well    Behavior During Therapy  [facility] for tasks assessed/performed       Past Medical History:  Diagnosis Date  . Anemia   . GERD 12/30/2006  . Headache(784.0)   . Hypertension   . Multiple sclerosis ([place])   . PARESTHESIA 12/30/2006  . Seizures ([place])   . Stroke [facility])     Past Surgical History:  Procedure Laterality Date  . ANAL INTRAEPITHELIAL NEOPLASIA EXCISION    . BREAST BIOPSY    . CESAREAN SECTION    . EYE SURGERY Right   . Myometomy    . TONSILLECTOMY      There were no vitals filed for this visit.  Subjective Assessment - 12/21/18 0959    Subjective  She says she has more pain on days she is not in PT, says she had some XR done from chiro and she has some Rt hip OA    Pertinent History  h/o MS since 2008, seizures, mini stroke per patient.  *Was going to [facility] prior to covid closure*    Patient Stated Goals  Reduce hip pain, back pain, improve walking.   Patient also has L shoulder pain.    Currently in Pain?  Yes    Pain Score  2     Pain Location  Back    Pain Orientation  Lower    Pain Descriptors / Indicators  Aching    Pain Type  Chronic pain    Pain Radiating Towards  Rt hip    Pain Onset  More than a month ago                       [facility] Adult PT Treatment/Exercise - 12/21/18 0001      Ambulation/Gait    Ambulation/Gait  Yes    Ambulation/Gait Assistance  7: Independent    Ambulation Distance (Feet)  350 Feet    Assistive device  None    Gait Pattern  Within Functional Limits;Antalgic    Gait Comments  slight antalgic gait on Rt after beginning 3rd lap      Lumbar Exercises: Stretches   Single Knee to Chest Stretch  5 reps;10 seconds    Single Knee to Chest Stretch Limitations  passive    Lower Trunk Rotation  5 reps;10 seconds    Other Lumbar Stretch Exercise  passive hip add stretch and piriformis stretch and hamstring stretch on Rt 2 reps  X20 sec    Other Lumbar Stretch Exercise  quadriped cat/camel 5 sec X 10, child pose stretch 20 sec  x3      Lumbar Exercises: Supine   Clam Limitations  unilat X 15 each side    Bridge Limitations  able to perform today in small  ROM 2X10      Knee/Hip Exercises: Prone   Other Prone Exercises  prone lying over one pillow 3 min with manual therapy      Manual Therapy   Manual therapy comments  Long axis distraction bilat, manual Rt hip stretching and inf mobs, MET Rt leg pulling into ext, Lt leg into flexion, T.P. relase to glutes in prone with and without IR/ER PROM               PT Short Term Goals - 12/09/18 2103      PT SHORT TERM GOAL #1   Title  The patient will be independent with HEP for core stabilization, stretching, postural strengthening.    Time  4    Period  Weeks    Target Date  01/08/19      PT SHORT TERM GOAL #2   Title  The patient will report pain < or equal to 2/10 with gait x 3 minutes to demonstrate improved standing/activity tolerance.    Baseline  6/10 with gait x 2 minutes.    Time  4    Period  Weeks    Target Date  01/08/19      PT SHORT TERM GOAL #3   Title  The patient will perform forward flexion without pain at end range.    Time  4    Period  Weeks    Target Date  01/08/19      PT SHORT TERM GOAL #4   Title  The patient will return to sleeping in her bed versus recliner.    Time  4     Target Date  01/08/19        PT Long Term Goals - 12/09/18 2116      PT LONG TERM GOAL #1   Title  The patient will verbalize wellness activities for long term activity.    Time  8    Period  Weeks    Target Date  02/07/19      PT LONG TERM GOAL #2   Title  The patient will ambulate for 10 minutes with pain < or equal to 2/10.    Baseline  6/10 with 2 minutes of gait    Time  8    Period  Weeks    Target Date  02/07/19      PT LONG TERM GOAL #3   Title  The patient will perform SLR x 10 reps R and L sides without pain.    Time  8    Period  Weeks    Target Date  02/07/19      PT LONG TERM GOAL #4   Title  The patient will demonstrate self mgmt techniques for low back pain mgmt.    Time  8    Period  Weeks    Target Date  02/07/19            Plan - 12/21/18 1103    Clinical Impression Statement  Session foucsed on manual therapy for pain reduction and Rt hip ROM followed by lumbar hip stretching and ambulation tolerance. She was able to walk 2 laps without pain but then started having pain on the 3rd lap. PT will continue to progress as able.    PT Treatment/Interventions  ADLs/Self Care Home Management;Neuromuscular re-education;Patient/family education;Manual techniques;Balance training;Therapeutic exercise;Therapeutic activities;Functional mobility training;Gait training;Passive range of motion;Spinal Manipulations;Dry needling    PT Next Visit Plan  R hip distraction, focus on R hip mobility and  pain reduction to improve gait mechanics, check on progress with HEP, "smile away" lumbar mobilization, core activation activities.  Plan to slowly introduct modified squats as patient able to tolerate and introduce pain science over next 2-3 visits.    Consulted and Agree with Plan of Care  Patient       Patient will benefit from skilled therapeutic intervention in order to improve the following deficits and impairments:  Abnormal gait, Decreased balance, Pain, Obesity,  Decreased activity tolerance, Decreased strength, Impaired flexibility, Postural dysfunction  Visit Diagnosis: 1. Radiculopathy, lumbar region   2. Other abnormalities of gait and mobility   3. Bilateral hip pain        Problem List Patient Active Problem List   Diagnosis Date Noted  . Low back pain due to bilateral sciatica 02/22/2018  . Essential hypertension 05/30/2015  . Right-sided chest pain 06/27/2014  . Microcytic anemia 06/27/2014  . Multiple sclerosis exacerbation ([place]) 06/27/2014  . Obesity (BMI 30-39.9) 06/27/2014  . B12 deficiency 06/06/2013  . Multiple sclerosis ([place]) 08/12/2010  . GERD 12/30/2006  . PARESTHESIA 12/30/2006    Silvestre Mesi 12/21/2018, 11:12 AM  [facility] [address] [place], [place], [address] Phone: [phone]   Fax:  [phone]  Name: [name] MRN: [id number] Date of Birth: [birth year]

## 2018-12-22 ENCOUNTER — Encounter: Payer: Self-pay | Admitting: Family Medicine

## 2018-12-23 ENCOUNTER — Other Ambulatory Visit: Payer: Self-pay | Admitting: Internal Medicine

## 2018-12-23 ENCOUNTER — Encounter

## 2018-12-26 ENCOUNTER — Ambulatory Visit: Payer: PPO | Admitting: Rehabilitative and Restorative Service Providers"

## 2018-12-26 ENCOUNTER — Telehealth: Payer: Self-pay | Admitting: Rehabilitative and Restorative Service Providers"

## 2018-12-26 NOTE — Telephone Encounter (Signed)
-----   Message from Janett Labella sent at 12/26/2018 11:40 AM EDT ----- Marykay Lex there,  [name] just called and apologized for missing her appt this morning.  She advised that her brother has been placed in hospice and she is spending family time with him during this time.  I have cancelled her appts for next week as well.  thanks

## 2019-01-04 ENCOUNTER — Other Ambulatory Visit: Payer: Self-pay

## 2019-01-04 ENCOUNTER — Encounter: Payer: Self-pay | Admitting: Family Medicine

## 2019-01-04 ENCOUNTER — Ambulatory Visit (INDEPENDENT_AMBULATORY_CARE_PROVIDER_SITE_OTHER): Payer: PPO | Admitting: Family Medicine

## 2019-01-04 ENCOUNTER — Ambulatory Visit: Payer: PPO | Admitting: Rehabilitative and Restorative Service Providers"

## 2019-01-04 ENCOUNTER — Telehealth: Payer: Self-pay

## 2019-01-04 VITALS — BP 130/80 | HR 86 | Temp 98.0°F | Wt 260.3 lb

## 2019-01-04 DIAGNOSIS — M5442 Lumbago with sciatica, left side: Secondary | ICD-10-CM | POA: Diagnosis not present

## 2019-01-04 DIAGNOSIS — M5441 Lumbago with sciatica, right side: Secondary | ICD-10-CM

## 2019-01-04 MED ORDER — METHYLPREDNISOLONE ACETATE 80 MG/ML IJ SUSP
80.0000 mg | Freq: Once | INTRAMUSCULAR | Status: AC
  Administered 2019-01-04: 120 mg via INTRAMUSCULAR

## 2019-01-04 NOTE — Progress Notes (Signed)
   Subjective:    Patient ID: [name], female    DOB: 09/12/1965, 53 y.o.   MRN: [id number]  HPI Here for several weeks of low back pain and right sided sciatica. She saw Kathleen Servant DO at [facility] on 10-27-18, and she recommended [name] have a sacroiliac joint injection and an epidural steroid injection. This was postponed for awhile because [name] has been spending time with her brother who is here in [facility] on a hospice unit. [name] has also seen Estill Batten DC at [facility].    Review of Systems  Constitutional: Negative.   Respiratory: Negative.   Cardiovascular: Negative.   Musculoskeletal: Positive for back pain.       Objective:   Physical Exam Constitutional:      Appearance: Normal appearance.     Comments: Walks with a cane   Cardiovascular:     Rate and Rhythm: Normal rate and regular rhythm.     Pulses: Normal pulses.     Heart sounds: Normal heart sounds.  Pulmonary:     Effort: Pulmonary effort is normal.     Breath sounds: Normal breath sounds.  Musculoskeletal:     Comments: Tender in the lower back and over the right sciatic notch   Neurological:     Mental Status: She is alert.           Assessment & Plan:  Low back pain with sciatica. Given a DepoMedrol shot today. She will follow up with the [place] providers when she has the time.  Alysia Penna, MD

## 2019-01-04 NOTE — Addendum Note (Signed)
Addended by: Rebecca Eaton on: 01/04/2019 11:14 AM   Modules accepted: Orders

## 2019-01-04 NOTE — Telephone Encounter (Signed)
Patient called and asked for an appointment cortisone shot. Lower back pain. Pain radiates down right the leg. 7:10 pain scale. difficult to walk. Sharp pain in lower back dull pain going down the leg.  Patient has been scheduled for 10:15 today.  Will send to Dr. Sarajane Jews as Juluis Rainier

## 2019-01-06 ENCOUNTER — Ambulatory Visit: Payer: PPO | Admitting: Rehabilitative and Restorative Service Providers"

## 2019-01-13 ENCOUNTER — Ambulatory Visit: Payer: PPO | Admitting: Rehabilitative and Restorative Service Providers"

## 2019-01-13 ENCOUNTER — Encounter: Payer: Self-pay | Admitting: Rehabilitative and Restorative Service Providers"

## 2019-01-13 ENCOUNTER — Other Ambulatory Visit: Payer: Self-pay

## 2019-01-13 DIAGNOSIS — M25551 Pain in right hip: Secondary | ICD-10-CM

## 2019-01-13 DIAGNOSIS — R2689 Other abnormalities of gait and mobility: Secondary | ICD-10-CM

## 2019-01-13 DIAGNOSIS — M5416 Radiculopathy, lumbar region: Secondary | ICD-10-CM | POA: Diagnosis not present

## 2019-01-13 NOTE — Therapy (Signed)
[facility] [address] [place], [place], [address] Phone: [phone]   Fax:  [phone]  Physical Therapy Treatment  Patient Details  Name: [name] MRN: [id number] Date of Birth: Oct 04, 1965 Referring Provider (PT): Cira Servant, DO   Encounter Date: 01/13/2019  PT End of Session - 01/13/19 1109    Visit Number  4    Number of Visits  17    Date for PT Re-Evaluation  02/07/19    Authorization Type  health team advantage    PT Start Time  1105    PT Stop Time  1145    PT Time Calculation (min)  40 min    Activity Tolerance  Patient tolerated treatment well    Behavior During Therapy  [facility] for tasks assessed/performed       Past Medical History:  Diagnosis Date  . Anemia   . GERD 12/30/2006  . Headache(784.0)   . Hypertension   . Multiple sclerosis ([place])   . PARESTHESIA 12/30/2006  . Seizures ([place])   . Stroke [facility])     Past Surgical History:  Procedure Laterality Date  . ANAL INTRAEPITHELIAL NEOPLASIA EXCISION    . BREAST BIOPSY    . CESAREAN SECTION    . EYE SURGERY Right   . Myometomy    . TONSILLECTOMY      There were no vitals filed for this visit.  Subjective Assessment - 01/13/19 1106    Subjective  "My hip has been really really cutting up.  It was debilitating last week.  I could hardly walk."  She feels the pain is related to medication changes of decreasing meloxicam.  She is doing better and restarted meloxicam.  "My mind is all over the place."  She hasn't had a chance to call her doctor to talk about medication changes.    Pertinent History  h/o MS since 2008, seizures, mini stroke per patient.  *Was going to [facility] prior to covid closure*    Patient Stated Goals  Reduce hip pain, back pain, improve walking.   Patient also has L shoulder pain.    Currently in Pain?  Yes    Pain Score  3    last week is was a 10   Pain Location  Sacrum   and groin   Pain Orientation  Right     Pain Descriptors / Indicators  Aching    Pain Type  Chronic pain    Pain Onset  More than a month ago    Pain Frequency  Constant    Aggravating Factors   walking    Pain Relieving Factors  medication and rest                       [facility] Adult PT Treatment/Exercise - 01/13/19 1640      Self-Care   Self-Care  Other Self-Care Comments    Other Self-Care Comments   We discussed current home situation and patient's schedule-- she is sitting bedside with her brother x hours per day.  She has not had time to do home exercises.  We talked about holding therapy until she has time to dedicate to her exercises, but she feels relief with PT and notes it is a break.  We discussed taking time to work on exercises and letting PT know if we need to hold as she is dealing with a stressful time.      Exercises   Exercises  Other  Exercises    Other Exercises   Performed supine SLR with pain R SI region and groin, supine marching with pain, and lumbar rocking with pain.  Patient reports that she anticipates everything will hurt b/c of arthritis noted on x-ray.  PT discussed  that we want to continue to stretch and strengthen as tolerated.  PT performed PROM hamstrings and knee to chest, R hip adductor stretch.  Perfomed sidelying R hip abduction with assistance x 10 reps, supine lumbar rocking with legs supported on ball, supine quad sets R leg x 5 reps, seated LAQs.  When Patient performed SLR to 20 degrees with quad set, she does not get pain.  PT to focus on strengthening around hip and core stability to reduce pain.  Quadriped cat/cow x 5 reps, childs pose stretching with lateral reaching R and L sides, wide leg childs pose for deep hip stretch.             PT Education - 01/13/19 1636    Education Details  added quad activation for HEP;  discussed using hospice counselors to work with, discussed self care and energy conservation related to [place]    Person(s) Educated  Patient     Methods  Explanation;Demonstration;Handout    Comprehension  Verbalized understanding;Returned demonstration       PT Short Term Goals - 01/13/19 1637      PT SHORT TERM GOAL #1   Title  The patient will be independent with HEP for core stabilization, stretching, postural strengthening.    Time  4    Period  Weeks    Status  Revised    Target Date  01/27/19      PT SHORT TERM GOAL #2   Title  The patient will report pain < or equal to 2/10 with gait x 3 minutes to demonstrate improved standing/activity tolerance.    Baseline  6/10 with gait x 2 minutes.    Time  4    Period  Weeks    Target Date  01/27/19      PT SHORT TERM GOAL #3   Title  The patient will perform forward flexion without pain at end range.    Time  4    Period  Weeks    Target Date  01/27/19      PT SHORT TERM GOAL #4   Title  The patient will return to sleeping in her bed versus recliner.    Time  4    Target Date  01/27/19        PT Long Term Goals - 12/09/18 2116      PT LONG TERM GOAL #1   Title  The patient will verbalize wellness activities for long term activity.    Time  8    Period  Weeks    Target Date  02/07/19      PT LONG TERM GOAL #2   Title  The patient will ambulate for 10 minutes with pain < or equal to 2/10.    Baseline  6/10 with 2 minutes of gait    Time  8    Period  Weeks    Target Date  02/07/19      PT LONG TERM GOAL #3   Title  The patient will perform SLR x 10 reps R and L sides without pain.    Time  8    Period  Weeks    Target Date  02/07/19      PT LONG  TERM GOAL #4   Title  The patient will demonstrate self mgmt techniques for low back pain mgmt.    Time  8    Period  Weeks    Target Date  02/07/19            Plan - 01/13/19 1637    Clinical Impression Statement  The patient has only been seen for 4 visits due to recent cancellations due to brother's placement on hospice care.  Patient has not been able to do regular exercises due to personal  situation.  PT and patient discussed self care and energy conservation related to MS as she notes feeling overwhelmed at the time.  We discussed access to counselors with hospice to provide support.    PT Treatment/Interventions  ADLs/Self Care Home Management;Neuromuscular re-education;Patient/family education;Manual techniques;Balance training;Therapeutic exercise;Therapeutic activities;Functional mobility training;Gait training;Passive range of motion;Spinal Manipulations;Dry needling    PT Next Visit Plan  R hip mobility and pain reduction, progress walking as able, review prior HEP, plan to begin functional strengthening with modified squat and discuss pain science.    Consulted and Agree with Plan of Care  Patient       Patient will benefit from skilled therapeutic intervention in order to improve the following deficits and impairments:  Abnormal gait, Decreased balance, Pain, Obesity, Decreased activity tolerance, Decreased strength, Impaired flexibility, Postural dysfunction  Visit Diagnosis: Other abnormalities of gait and mobility  Bilateral hip pain  Radiculopathy, lumbar region     Problem List Patient Active Problem List   Diagnosis Date Noted  . Low back pain due to bilateral sciatica 02/22/2018  . Essential hypertension 05/30/2015  . Right-sided chest pain 06/27/2014  . Microcytic anemia 06/27/2014  . Multiple sclerosis exacerbation ([place]) 06/27/2014  . Obesity (BMI 30-39.9) 06/27/2014  . B12 deficiency 06/06/2013  . Multiple sclerosis ([place]) 08/12/2010  . GERD 12/30/2006  . PARESTHESIA 12/30/2006    Tiani Stanbery, PT 01/13/2019, 4:53 PM  [facility] [address] [place], [place], [address] Phone: [phone]   Fax:  [phone]  Name: AZUCENA STEPANIAN MRN: [id number] Date of Birth: 07/19/1965

## 2019-01-13 NOTE — Patient Instructions (Signed)
Access Code: [email or web address]  URL: [email or web address]  Date: 01/13/2019  Prepared by: Rudell Cobb   Exercises Side Lunge Adductor Stretch - 3 reps - 1 sets - 20 seconds hold - 2x daily - 7x weekly Supine Posterior Pelvic Tilt - 10 reps - 1 sets - 3-5 hold - 2x daily - 7x weekly Quadruped Cat Camel - 10 reps - 1 sets - 2x daily - 7x weekly Quadruped Rocking Slow - 10 reps - 1 sets - 2x daily - 7x weekly Seated Long Arc Quad - 10 reps - 1 sets - 2x daily - 7x weekly

## 2019-01-16 ENCOUNTER — Other Ambulatory Visit: Payer: Self-pay

## 2019-01-16 ENCOUNTER — Ambulatory Visit: Payer: PPO | Admitting: Rehabilitative and Restorative Service Providers"

## 2019-01-16 DIAGNOSIS — M5416 Radiculopathy, lumbar region: Secondary | ICD-10-CM

## 2019-01-16 DIAGNOSIS — M25552 Pain in left hip: Secondary | ICD-10-CM

## 2019-01-16 DIAGNOSIS — R2689 Other abnormalities of gait and mobility: Secondary | ICD-10-CM

## 2019-01-16 DIAGNOSIS — M25551 Pain in right hip: Secondary | ICD-10-CM

## 2019-01-16 NOTE — Therapy (Signed)
[place] [address] [place], [place], [address] Phone: [phone]   Fax:  [phone]  Physical Therapy Treatment  Patient Details  Name: [name] MRN: [id number] Date of Birth: [birth year] Referring Provider (PT): Cira Servant, DO   Encounter Date: 01/16/2019  PT End of Session - 01/16/19 1501    Visit Number  5    Number of Visits  17    Date for PT Re-Evaluation  02/07/19    Authorization Type  health team advantage    PT Start Time  1015    PT Stop Time  1100    PT Time Calculation (min)  45 min    Activity Tolerance  Patient tolerated treatment well    Behavior During Therapy  [facility] for tasks assessed/performed       Past Medical History:  Diagnosis Date  . Anemia   . GERD 12/30/2006  . Headache(784.0)   . Hypertension   . Multiple sclerosis ([place])   . PARESTHESIA 12/30/2006  . Seizures ([facility])   . Stroke [facility])     Past Surgical History:  Procedure Laterality Date  . ANAL INTRAEPITHELIAL NEOPLASIA EXCISION    . BREAST BIOPSY    . CESAREAN SECTION    . EYE SURGERY Right   . Myometomy    . TONSILLECTOMY      There were no vitals filed for this visit.  Subjective Assessment - 01/16/19 1021    Subjective  The patient reports she got her brother food over the weekend at hospice and felt better because his intake improved.  The patient reports she was not sore.  She did not have time to do her home exercises.    Pertinent History  h/o MS since 2008, seizures, mini stroke per patient.  *Was going to [facility] prior to covid closure*    Patient Stated Goals  Reduce hip pain, back pain, improve walking.   Patient also has L shoulder pain.    Currently in Pain?  Yes    Pain Score  3     Pain Location  [place]    Pain Orientation  Right    Pain Descriptors / Indicators  Aching    Pain Type  Chronic pain    Pain Onset  More than a month ago    Pain Frequency  Constant    Aggravating Factors    walking    Pain Relieving Factors  medication and rest                       [facility] Adult PT Treatment/Exercise - 01/16/19 1034      Ambulation/Gait   Ambulation/Gait  Yes    Ambulation/Gait Assistance  7: Independent    Ambulation Distance (Feet)  --   3 minutes, 2.5 minutes   Assistive device  None    Gait Pattern  Within Functional Limits;Antalgic    Ambulation Surface  Level;Indoor    Gait velocity  3.79 ft/sec    Gait Comments  The patient's pain level remains at 3/10 with gait today.  After stretching, she notes initial soreness with walking and the more she walks, the more she reports stretching out and dec'd discomfort.      Self-Care   Self-Care  Other Self-Care Comments    Other Self-Care Comments   pain science discussed focusing on current life stressors.  Last week when her brother was not doing as well (he is on hospice), she arrived  tearful, tired, and noting pain with each exercise.  Over the weekend, her brother ate more food and she notes he was able to joke with her.  She has a more positive attitude and experience today.  We discussed how emotions and life stressors contribute to pain experience and that continuing to focus on self care and exercise will help work through these times.        Exercises   Exercises  Other Exercises;Lumbar;Shoulder      Lumbar Exercises: Stretches   Active Hamstring Stretch  Right;Left;3 reps;30 seconds    Single Knee to Chest Stretch  Right    Single Knee to Chest Stretch Limitations  can tolerate knee to chest with mild discomfort, no significant pain    Lower Trunk Rotation  5 reps;10 seconds    Other Lumbar Stretch Exercise  Lumbar rocking with feet positioned wide for increased hip stretch.    Other Lumbar Stretch Exercise  seated saddle stretch with forward flexion, then to right and left sides with 20 second holds.       Lumbar Exercises: Standing   Functional Squats  10 reps   2 sets   Functional Squats  Limitations  Lifting 5 lb kettle bell during a modified squat.    Lifting  to overhead;10 reps   3 lbs alternating x 2 sets   Lifting Limitations  in standing for core activation.  Patinet states "this feels so good."    Other Standing Lumbar Exercises  Standing forward flexion x 3 repetitions.      Lumbar Exercises: Supine   Dead Bug  10 reps    Bridge  15 reps    Straight Leg Raise  10 reps    Straight Leg Raises Limitations  `      Lumbar Exercises: Quadruped   Straight Leg Raise  10 reps;2 seconds    Straight Leg Raises Limitations  Alternating LE hip extension with verbal and tactile cues for core activation    Other Quadruped Lumbar Exercises  core activation in quadriped               PT Short Term Goals - 01/16/19 1027      PT SHORT TERM GOAL #1   Title  The patient will be independent with HEP for core stabilization, stretching, postural strengthening.    Time  4    Period  Weeks    Status  On-going    Target Date  01/27/19      PT SHORT TERM GOAL #2   Title  The patient will report pain < or equal to 2/10 with gait x 3 minutes to demonstrate improved standing/activity tolerance.    Baseline  6/10 with gait x 2 minutes. at eval; 01/16/19:  UPDATE= Pt walks 3 minutes with 3/10 R SI pain.  This was her baseline pain level, so no increase in symptoms with gait.    Time  4    Period  Weeks    Status  Partially Met    Target Date  01/27/19      PT SHORT TERM GOAL #3   Title  The patient will perform forward flexion without pain at end range.    Time  4    Period  Weeks    Status  Achieved    Target Date  01/27/19      PT SHORT TERM GOAL #4   Title  The patient will return to sleeping in her bed versus recliner.  Time  4    Status  On-going    Target Date  01/27/19        PT Long Term Goals - 12/09/18 2116      PT LONG TERM GOAL #1   Title  The patient will verbalize wellness activities for long term activity.    Time  8    Period  Weeks    Target  Date  02/07/19      PT LONG TERM GOAL #2   Title  The patient will ambulate for 10 minutes with pain < or equal to 2/10.    Baseline  6/10 with 2 minutes of gait    Time  8    Period  Weeks    Target Date  02/07/19      PT LONG TERM GOAL #3   Title  The patient will perform SLR x 10 reps R and L sides without pain.    Time  8    Period  Weeks    Target Date  02/07/19      PT LONG TERM GOAL #4   Title  The patient will demonstrate self mgmt techniques for low back pain mgmt.    Time  8    Period  Weeks    Target Date  02/07/19            Plan - 01/16/19 1507    Clinical Impression Statement  Today's session focused on increasing intensity of exercise and walking as patient was having a good day.  She was able to tolerate a SLR to 10" without R hip or groin pain, tolerate a full bridge with only "mild discomfort" in R SI region, and notes enjoying the weight/resistance activities introduced today.  PT to continue to set up resistance activities for home to promote continued improvement in strength and core stability.    PT Treatment/Interventions  ADLs/Self Care Home Management;Neuromuscular re-education;Patient/family education;Manual techniques;Balance training;Therapeutic exercise;Therapeutic activities;Functional mobility training;Gait training;Passive range of motion;Spinal Manipulations;Dry needling    PT Next Visit Plan  R hip mobility and pain reduction, progress walking as able, review prior HEP, plan to begin functional strengthening with modified squat and discuss pain science.    Consulted and Agree with Plan of Care  Patient       Patient will benefit from skilled therapeutic intervention in order to improve the following deficits and impairments:  Abnormal gait, Decreased balance, Pain, Obesity, Decreased activity tolerance, Decreased strength, Impaired flexibility, Postural dysfunction  Visit Diagnosis: Other abnormalities of gait and mobility  Bilateral hip  pain  Radiculopathy, lumbar region     Problem List Patient Active Problem List   Diagnosis Date Noted  . Low back pain due to bilateral sciatica 02/22/2018  . Essential hypertension 05/30/2015  . Right-sided chest pain 06/27/2014  . Microcytic anemia 06/27/2014  . Multiple sclerosis exacerbation ([facility]) 06/27/2014  . Obesity (BMI 30-39.9) 06/27/2014  . B12 deficiency 06/06/2013  . Multiple sclerosis ([facility]) 08/12/2010  . GERD 12/30/2006  . PARESTHESIA 12/30/2006    WEAVER,CHRISTINA, PT 01/16/2019, 3:09 PM  [facility] [address] [place], [place], [address] Phone: [phone]   Fax:  [phone]  Name: Moria S Beber MRN: [id number] Date of Birth: 01/21/1966   

## 2019-01-17 DIAGNOSIS — M25551 Pain in right hip: Secondary | ICD-10-CM | POA: Diagnosis not present

## 2019-01-17 DIAGNOSIS — M533 Sacrococcygeal disorders, not elsewhere classified: Secondary | ICD-10-CM | POA: Diagnosis not present

## 2019-01-17 DIAGNOSIS — M5136 Other intervertebral disc degeneration, lumbar region: Secondary | ICD-10-CM | POA: Diagnosis not present

## 2019-01-17 DIAGNOSIS — M47816 Spondylosis without myelopathy or radiculopathy, lumbar region: Secondary | ICD-10-CM | POA: Diagnosis not present

## 2019-01-17 DIAGNOSIS — G8929 Other chronic pain: Secondary | ICD-10-CM | POA: Diagnosis not present

## 2019-01-20 ENCOUNTER — Ambulatory Visit: Payer: PPO | Admitting: Rehabilitative and Restorative Service Providers"

## 2019-01-25 ENCOUNTER — Ambulatory Visit: Payer: PPO | Admitting: Rehabilitative and Restorative Service Providers"

## 2019-01-27 ENCOUNTER — Ambulatory Visit: Payer: PPO | Admitting: Rehabilitative and Restorative Service Providers"

## 2019-01-31 ENCOUNTER — Other Ambulatory Visit: Payer: Self-pay | Admitting: Internal Medicine

## 2019-02-22 ENCOUNTER — Ambulatory Visit: Payer: PPO | Admitting: Family Medicine

## 2019-02-27 ENCOUNTER — Encounter: Payer: Self-pay | Admitting: Rehabilitative and Restorative Service Providers"

## 2019-02-27 NOTE — Therapy (Signed)
[place] [address] [place], [place], [address] Phone: [phone]   Fax:  [phone]  Patient Details  Name: [name] MRN: [id number] Date of Birth: 11-23-1965 Referring Provider:  No ref. provider found  Encounter Date: last encounter 01/13/19  PHYSICAL THERAPY DISCHARGE SUMMARY  Visits from Start of Care: 5  Current functional level related to goals / functional outcomes: PT Short Term Goals - 01/16/19 1027      PT SHORT TERM GOAL #1   Title  The patient will be independent with HEP for core stabilization, stretching, postural strengthening.    Time  4    Period  Weeks    Status  On-going    Target Date  01/27/19      PT SHORT TERM GOAL #2   Title  The patient will report pain < or equal to 2/10 with gait x 3 minutes to demonstrate improved standing/activity tolerance.    Baseline  6/10 with gait x 2 minutes. at eval; 01/16/19:  UPDATE= Pt walks 3 minutes with 3/10 R SI pain.  This was her baseline pain level, so no increase in symptoms with gait.    Time  4    Period  Weeks    Status  Partially Met    Target Date  01/27/19      PT SHORT TERM GOAL #3   Title  The patient will perform forward flexion without pain at end range.    Time  4    Period  Weeks    Status  Achieved    Target Date  01/27/19      PT SHORT TERM GOAL #4   Title  The patient will return to sleeping in her bed versus recliner.    Time  4    Status  On-going    Target Date  01/27/19      PT Long Term Goals - 12/09/18 2116      PT LONG TERM GOAL #1   Title  The patient will verbalize wellness activities for long term activity.    Time  8    Period  Weeks    Target Date  02/07/19      PT LONG TERM GOAL #2   Title  The patient will ambulate for 10 minutes with pain < or equal to 2/10.    Baseline  6/10 with 2 minutes of gait    Time  8    Period  Weeks    Target Date  02/07/19      PT LONG TERM GOAL #3   Title  The patient will perform SLR x 10 reps R and L sides without pain.    Time  8    Period  Weeks    Target Date  02/07/19      PT LONG TERM GOAL #4   Title  The patient will demonstrate self mgmt techniques for low back pain mgmt.    Time  8    Period  Weeks    Target Date  02/07/19      Status unknown at time of d/c.  Patient called to cancel remaining visits.   Remaining deficits: See last note on 8/28-- patient cancelled remaining visits due to her brother being on hospice.   Education / Equipment: Home program.  Plan: Patient agrees to discharge.  Patient goals were partially met. Patient is being discharged due to not returning since the last visit.  ?????  Thank you for the referral of this patient. Rudell Cobb, MPT    Hiba Garry 02/27/2019, 12:06 PM  [facility] [address] [place], [place], [address] Phone: [phone]   Fax:  [phone]

## 2019-03-08 DIAGNOSIS — H5310 Unspecified subjective visual disturbances: Secondary | ICD-10-CM | POA: Diagnosis not present

## 2019-03-08 DIAGNOSIS — H35371 Puckering of macula, right eye: Secondary | ICD-10-CM | POA: Diagnosis not present

## 2019-03-08 DIAGNOSIS — G35 Multiple sclerosis: Secondary | ICD-10-CM | POA: Diagnosis not present

## 2019-03-08 DIAGNOSIS — Z79899 Other long term (current) drug therapy: Secondary | ICD-10-CM | POA: Diagnosis not present

## 2019-03-08 DIAGNOSIS — R519 Headache, unspecified: Secondary | ICD-10-CM | POA: Diagnosis not present

## 2019-03-09 ENCOUNTER — Other Ambulatory Visit: Payer: Self-pay | Admitting: Internal Medicine

## 2019-03-14 ENCOUNTER — Telehealth: Payer: Self-pay | Admitting: Family Medicine

## 2019-03-14 NOTE — Telephone Encounter (Signed)
The patient called to see about getting a Rx refill for Famotidine sent to:  [id number] [place], [place] [phone] (Phone) [phone] (Fax)  She is completley out of her medication and is hoping to have it refilled today.  She is also wanting an appointment tomorrow to discuss her thyroid issue.  Can we work her in tomorrow?  Please advise

## 2019-03-14 NOTE — Telephone Encounter (Signed)
Okay to refill? 

## 2019-03-15 MED ORDER — FAMOTIDINE 40 MG/5ML PO SUSR: 40.0000 mg | Freq: Every day | ORAL | 3 refills | Status: DC

## 2019-03-15 NOTE — Telephone Encounter (Signed)
I have scheduled the patient for Friday and called and let the patient know that he refilled her medication for her for a year

## 2019-03-15 NOTE — Addendum Note (Signed)
Addended by: Gwenyth Ober R on: 03/15/2019 05:29 PM   Modules accepted: Orders

## 2019-03-15 NOTE — Telephone Encounter (Signed)
Please refill Famotidine for one year. Let's keep the time slot open for tomorrow since we are already pretty full. I can see her Friday morning though if she wishes

## 2019-03-15 NOTE — Telephone Encounter (Signed)
Noted  

## 2019-03-16 NOTE — Telephone Encounter (Signed)
Noted  

## 2019-03-17 ENCOUNTER — Ambulatory Visit (INDEPENDENT_AMBULATORY_CARE_PROVIDER_SITE_OTHER): Payer: PPO | Admitting: Family Medicine

## 2019-03-17 ENCOUNTER — Encounter: Payer: Self-pay | Admitting: Family Medicine

## 2019-03-17 ENCOUNTER — Other Ambulatory Visit: Payer: Self-pay

## 2019-03-17 VITALS — BP 130/78 | HR 97 | Temp 97.9°F | Ht 63.0 in | Wt 255.2 lb

## 2019-03-17 DIAGNOSIS — R590 Localized enlarged lymph nodes: Secondary | ICD-10-CM

## 2019-03-17 MED ORDER — FAMOTIDINE 40 MG/5ML PO SUSR: 40.0000 mg | Freq: Every day | ORAL | 3 refills | Status: DC

## 2019-03-17 NOTE — Progress Notes (Signed)
   Subjective:    Patient ID: [name], female    DOB: [birth year], 53 y.o.   MRN: [id number]  HPI Here to check on a tender lump under the right jaw that came up 2 months ago. No mouth issues. She does complain of constant PND from the sinuses. She uses Benadryl for that. No fever or cough or SOB or body aches or NVD. Her last visit to her dentist was a year ago.    Review of Systems  Constitutional: Negative.   HENT: Positive for congestion and postnasal drip. Negative for sinus pressure, sinus pain and sore throat.   Eyes: Negative.   Respiratory: Negative.        Objective:   Physical Exam Constitutional:      Appearance: Normal appearance.  HENT:     Right Ear: Tympanic membrane, ear canal and external ear normal.     Left Ear: Tympanic membrane, ear canal and external ear normal.     Nose: Nose normal.     Mouth/Throat:     Pharynx: No posterior oropharyngeal erythema.     Comments: Poor dentition  Eyes:     Conjunctiva/sclera: Conjunctivae normal.  Neck:     Comments: There is a less than 1 cm mobile mildly tender lymph node just under the right mandible. The thyroid is normal  Cardiovascular:     Rate and Rhythm: Normal rate and regular rhythm.     Pulses: Normal pulses.     Heart sounds: Normal heart sounds.  Pulmonary:     Effort: Pulmonary effort is normal.     Breath sounds: Normal breath sounds.  Neurological:     Mental Status: She is alert.           Assessment & Plan:  Submandibular node. This is likely reacting to sinus inflammation. I suggested she use Flonase and Xyzal daily. Recheck prn.  Alysia Penna, MD

## 2019-04-10 ENCOUNTER — Other Ambulatory Visit: Payer: Self-pay | Admitting: Family Medicine

## 2019-06-29 ENCOUNTER — Other Ambulatory Visit: Payer: Self-pay | Admitting: Family Medicine

## 2019-06-29 DIAGNOSIS — N958 Other specified menopausal and perimenopausal disorders: Secondary | ICD-10-CM | POA: Diagnosis not present

## 2019-06-29 DIAGNOSIS — Z1231 Encounter for screening mammogram for malignant neoplasm of breast: Secondary | ICD-10-CM

## 2019-06-29 DIAGNOSIS — D251 Intramural leiomyoma of uterus: Secondary | ICD-10-CM | POA: Diagnosis not present

## 2019-06-29 DIAGNOSIS — D259 Leiomyoma of uterus, unspecified: Secondary | ICD-10-CM | POA: Diagnosis not present

## 2019-06-29 DIAGNOSIS — G35 Multiple sclerosis: Secondary | ICD-10-CM | POA: Diagnosis not present

## 2019-06-29 NOTE — Telephone Encounter (Signed)
Medication Refill:  Citalopram Oxcarbazepine Gabapentin Levetiracetam  Pharmacy:  [facility] [place]: [phone]  Medication Refill: Tizanidine  Pharmacy: [facility] [place], [place], [place] [address]  Pt is also needing Dr. Sarajane Jews to renew her handicap sticker  Pt can be reached at [phone]

## 2019-06-29 NOTE — Telephone Encounter (Signed)
Please advise. These prescriptions have not been prescribed you

## 2019-06-30 MED ORDER — CITALOPRAM HYDROBROMIDE 20 MG PO TABS: 20.0000 mg | ORAL_TABLET | Freq: Every day | ORAL | 3 refills | Status: DC

## 2019-06-30 NOTE — Telephone Encounter (Signed)
Please refill the Celexa for a year, but the others come from her neurologist

## 2019-07-06 ENCOUNTER — Telehealth: Payer: Self-pay | Admitting: Family Medicine

## 2019-07-06 NOTE — Chronic Care Management (AMB) (Signed)
  Chronic Care Management   Note  07/06/2019 Name: [name] MRN: [id number] DOB: [birth year]  [name] is a 54 y.o. year old female who is a primary care patient of Laurey Morale, MD. I reached out to [name] by phone today in response to a referral sent by [name]'s PCP, Laurey Morale, MD.   [name] was given information about Chronic Care Management services today including:  1. CCM service includes personalized support from designated clinical staff supervised by her physician, including individualized plan of care and coordination with other care providers 2. 24/7 contact phone numbers for assistance for urgent and routine care needs. 3. Service will only be billed when office clinical staff spend 20 minutes or more in a month to coordinate care. 4. Only one practitioner may furnish and bill the service in a calendar month. 5. The patient may stop CCM services at any time (effective at the end of the month) by phone call to the office staff. 6. The patient will be responsible for cost sharing (co-pay) of up to 20% of the service fee (after annual deductible is met).  Patient did not agree to enrollment in care management services and does not wish to consider at this time.  Follow up plan:   [name] [employer] Scheduler

## 2019-07-10 DIAGNOSIS — Z79899 Other long term (current) drug therapy: Secondary | ICD-10-CM | POA: Diagnosis not present

## 2019-07-10 DIAGNOSIS — R5383 Other fatigue: Secondary | ICD-10-CM | POA: Diagnosis not present

## 2019-07-10 DIAGNOSIS — R5381 Other malaise: Secondary | ICD-10-CM | POA: Diagnosis not present

## 2019-07-10 DIAGNOSIS — F4322 Adjustment disorder with anxiety: Secondary | ICD-10-CM | POA: Diagnosis not present

## 2019-07-10 DIAGNOSIS — F39 Unspecified mood [affective] disorder: Secondary | ICD-10-CM | POA: Diagnosis not present

## 2019-07-10 DIAGNOSIS — G35 Multiple sclerosis: Secondary | ICD-10-CM | POA: Diagnosis not present

## 2019-07-10 DIAGNOSIS — R269 Unspecified abnormalities of gait and mobility: Secondary | ICD-10-CM | POA: Diagnosis not present

## 2019-07-25 DIAGNOSIS — M5117 Intervertebral disc disorders with radiculopathy, lumbosacral region: Secondary | ICD-10-CM | POA: Diagnosis not present

## 2019-07-25 DIAGNOSIS — M4696 Unspecified inflammatory spondylopathy, lumbar region: Secondary | ICD-10-CM | POA: Diagnosis not present

## 2019-07-25 DIAGNOSIS — M5116 Intervertebral disc disorders with radiculopathy, lumbar region: Secondary | ICD-10-CM | POA: Diagnosis not present

## 2019-07-25 DIAGNOSIS — M5440 Lumbago with sciatica, unspecified side: Secondary | ICD-10-CM | POA: Diagnosis not present

## 2019-07-25 DIAGNOSIS — M9973 Connective tissue and disc stenosis of intervertebral foramina of lumbar region: Secondary | ICD-10-CM | POA: Diagnosis not present

## 2019-07-27 DIAGNOSIS — M47816 Spondylosis without myelopathy or radiculopathy, lumbar region: Secondary | ICD-10-CM | POA: Diagnosis not present

## 2019-07-27 DIAGNOSIS — M25551 Pain in right hip: Secondary | ICD-10-CM | POA: Diagnosis not present

## 2019-08-08 ENCOUNTER — Ambulatory Visit
Admission: RE | Admit: 2019-08-08 | Discharge: 2019-08-08 | Disposition: A | Discharge status: Home / Self Care | Payer: PPO | Source: Ambulatory Visit | Attending: Family Medicine | Admitting: Family Medicine

## 2019-08-08 ENCOUNTER — Other Ambulatory Visit: Payer: Self-pay

## 2019-08-08 DIAGNOSIS — Z1231 Encounter for screening mammogram for malignant neoplasm of breast: Secondary | ICD-10-CM | POA: Diagnosis not present

## 2019-08-14 ENCOUNTER — Other Ambulatory Visit: Payer: Self-pay

## 2019-08-15 ENCOUNTER — Encounter: Payer: Self-pay | Admitting: Family Medicine

## 2019-08-15 ENCOUNTER — Ambulatory Visit (INDEPENDENT_AMBULATORY_CARE_PROVIDER_SITE_OTHER): Payer: PPO | Admitting: Family Medicine

## 2019-08-15 VITALS — BP 130/80 | HR 92 | Temp 97.8°F | Wt 257.8 lb

## 2019-08-15 DIAGNOSIS — E538 Deficiency of other specified B group vitamins: Secondary | ICD-10-CM

## 2019-08-15 DIAGNOSIS — I10 Essential (primary) hypertension: Secondary | ICD-10-CM | POA: Diagnosis not present

## 2019-08-15 DIAGNOSIS — M5441 Lumbago with sciatica, right side: Secondary | ICD-10-CM | POA: Diagnosis not present

## 2019-08-15 DIAGNOSIS — M5442 Lumbago with sciatica, left side: Secondary | ICD-10-CM

## 2019-08-15 LAB — VITAMIN B12: Vitamin B-12: 712 pg/mL (ref 211–911)

## 2019-08-15 MED ORDER — METHYLPREDNISOLONE ACETATE 80 MG/ML IJ SUSP
80.0000 mg | Freq: Once | INTRAMUSCULAR | Status: AC
  Administered 2019-08-15: 80 mg via INTRAMUSCULAR

## 2019-08-15 MED ORDER — CYANOCOBALAMIN 1000 MCG/ML IJ SOLN
1000.0000 ug | Freq: Once | INTRAMUSCULAR | Status: AC
  Administered 2019-08-15: 1000 ug via INTRAMUSCULAR

## 2019-08-15 MED ORDER — FUROSEMIDE 40 MG PO TABS: 40.0000 mg | ORAL_TABLET | Freq: Every day | ORAL | 3 refills | Status: DC | PRN

## 2019-08-15 MED ORDER — METHYLPREDNISOLONE ACETATE 40 MG/ML IJ SUSP
40.0000 mg | Freq: Once | INTRAMUSCULAR | Status: AC
  Administered 2019-08-15: 40 mg via INTRAMUSCULAR

## 2019-08-15 MED ORDER — PROPRANOLOL HCL 20 MG PO TABS: ORAL_TABLET | ORAL | 2 refills | Status: DC

## 2019-08-15 NOTE — Progress Notes (Signed)
   Subjective:    Patient ID: [name], female    DOB: [birth year], 54 y.o.   MRN: [id number]  HPI Here for several issues. First she has concerns about her BP. Several years ago she was taking Losartan HCT for HTN, but she was able to lose weight and we were able to stop this. Since then her BP has been quite stable, usually in the 130s over 80s. However she is often told that her BP is very high at the dentist's office or in other situations where she gets anxious. In fact on 08-09-19 she was scheduled to have facet joint injections per Dr. Cira Servant at the [facility], but this had to be cancelled because when [name] was checked in her BP jumped up to 214/110. They observed her for 30 minutes and it stayed high, so she was sent home. After getting home her BP went back down to normal and it has stayed there the past few days. Second she asks for a steroid shot today because her low back has a lot of pain. Third she asks to get a B12 shot. She had been getting monthly B12 shots for several years, but she took herself off the shots last year. She has been taking an OTC oral brand but she feels like this is not helping. Fourth she ran out of Lasix, andher legs have been swelling lately. No SOB.    Review of Systems  Constitutional: Positive for fatigue.  Respiratory: Negative.   Cardiovascular: Positive for leg swelling. Negative for chest pain and palpitations.  Musculoskeletal: Positive for back pain.  Neurological: Negative.        Objective:   Physical Exam Constitutional:      Appearance: She is obese. She is not ill-appearing.  Cardiovascular:     Rate and Rhythm: Normal rate and regular rhythm.     Pulses: Normal pulses.     Heart sounds: Normal heart sounds.  Pulmonary:     Effort: Pulmonary effort is normal.     Breath sounds: Normal breath sounds.  Musculoskeletal:     Comments: 2+ edema in both lower legs   Neurological:     General: No focal deficit  present.     Mental Status: She is alert and oriented to person, place, and time.  Psychiatric:        Mood and Affect: Mood normal.        Behavior: Behavior normal.        Thought Content: Thought content normal.        Judgment: Judgment normal.           Assessment & Plan:  Her HTN seems to be well controlled except when she is in stressful situations. We will give her Propranolol 20 mg to take 1 or 2 tabs the mornings of medical or dental procedures, and hopefully this will prevent the spikes of BP she has been experiencing. Refill the Lasix for leg edema. Given a DepoMedrol shot for the back pain. We will send her for a B12 level today, and after that she will be given a B12 shot. We will resume her monthly regimen.  Alysia Penna, MD

## 2019-09-08 IMAGING — MG DIGITAL SCREENING BILATERAL MAMMOGRAM WITH TOMO AND CAD
8 series · 8 of 24 positions shown · non-contrast
Comparison: Previous exam(s).

CLINICAL DATA: Screening.

EXAM:
DIGITAL SCREENING BILATERAL MAMMOGRAM WITH TOMO AND CAD

[R CC synth-2D]
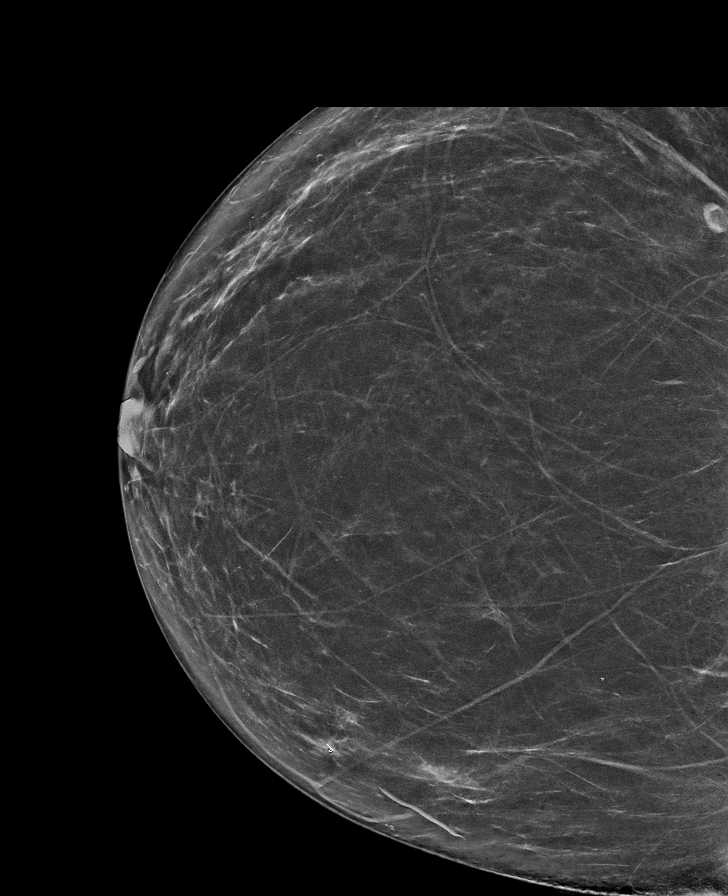

[L MLO synth-2D]
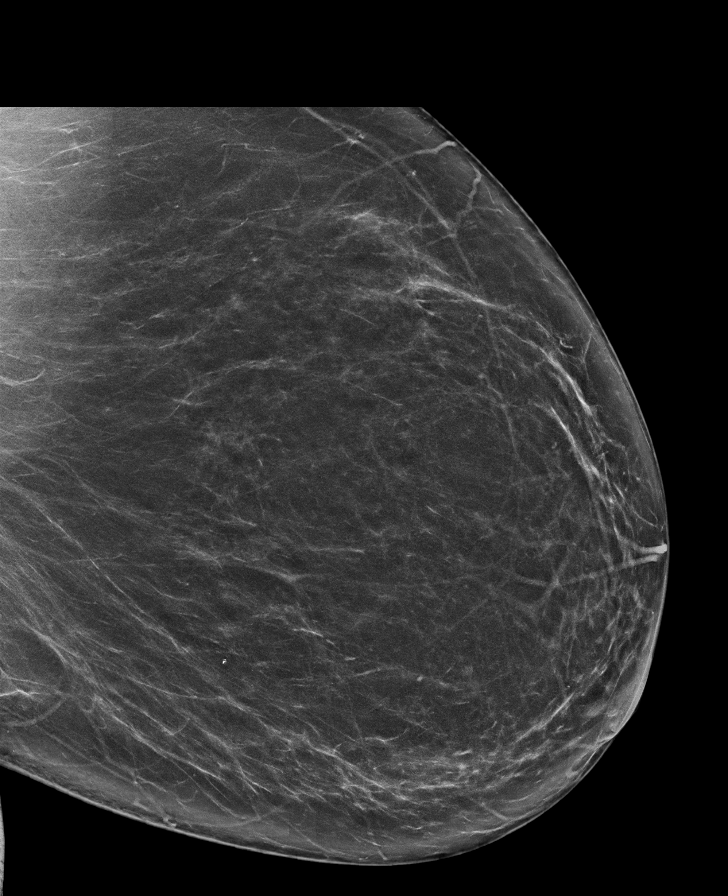

[R MLO synth-2D]
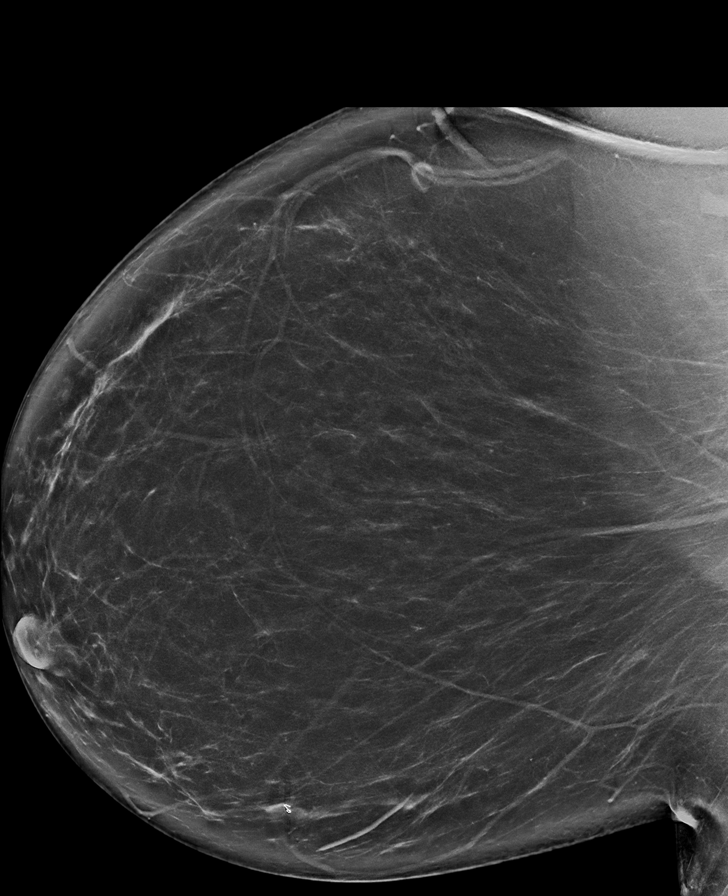

[L CC synth-2D]
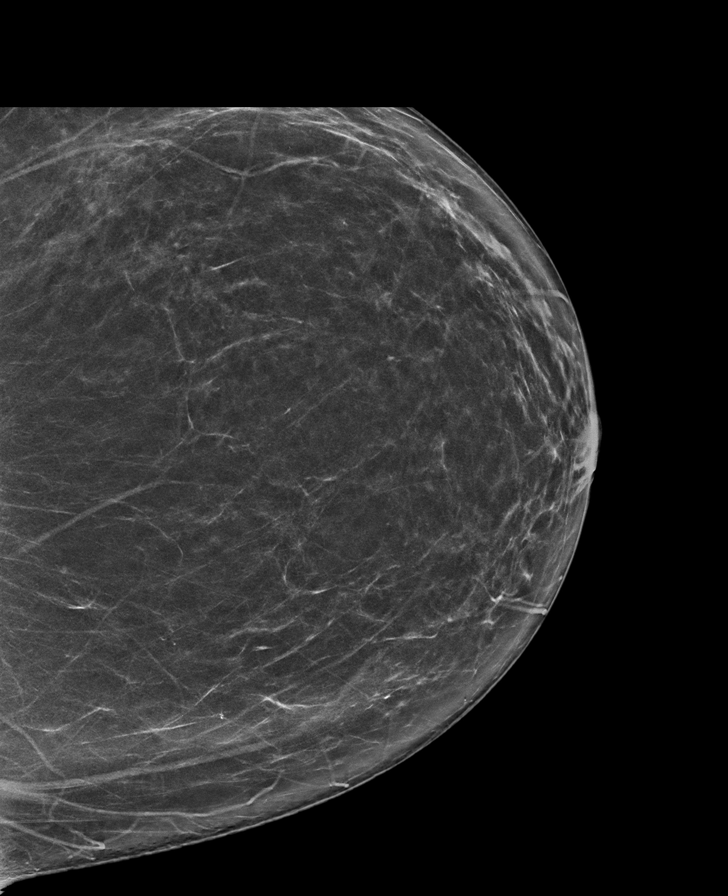

[L MLO tomo · tomo slice 46/91.0]
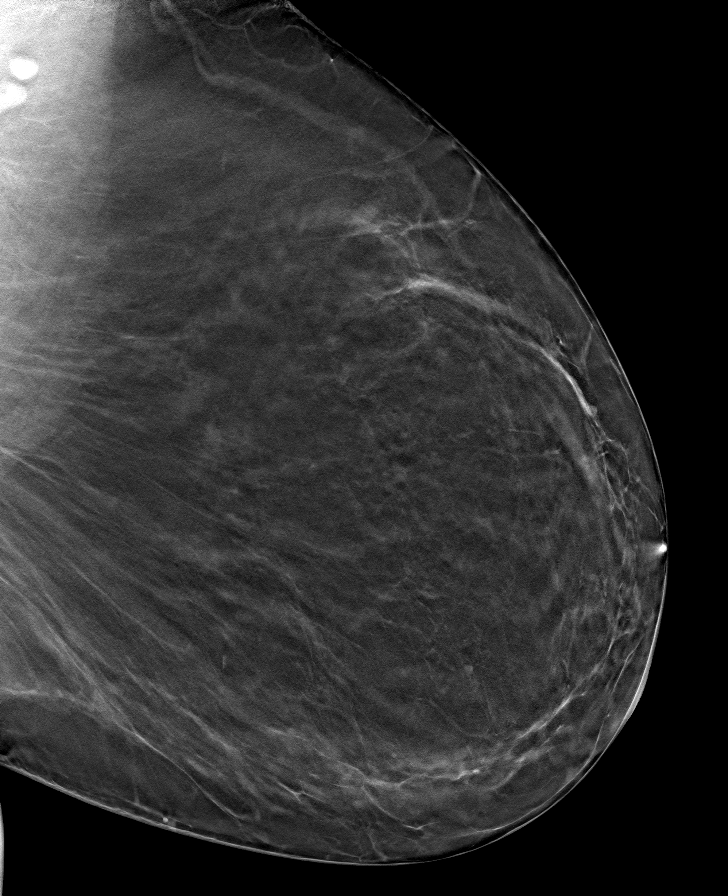

[R MLO tomo · tomo slice 49/98.0]
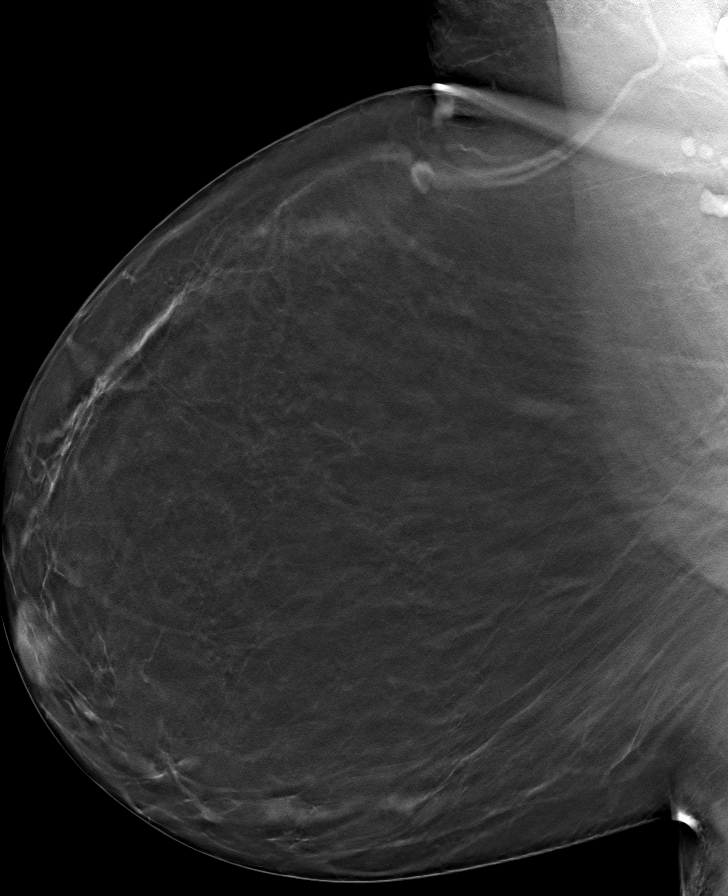

[L CC tomo · tomo slice 42/83.0]
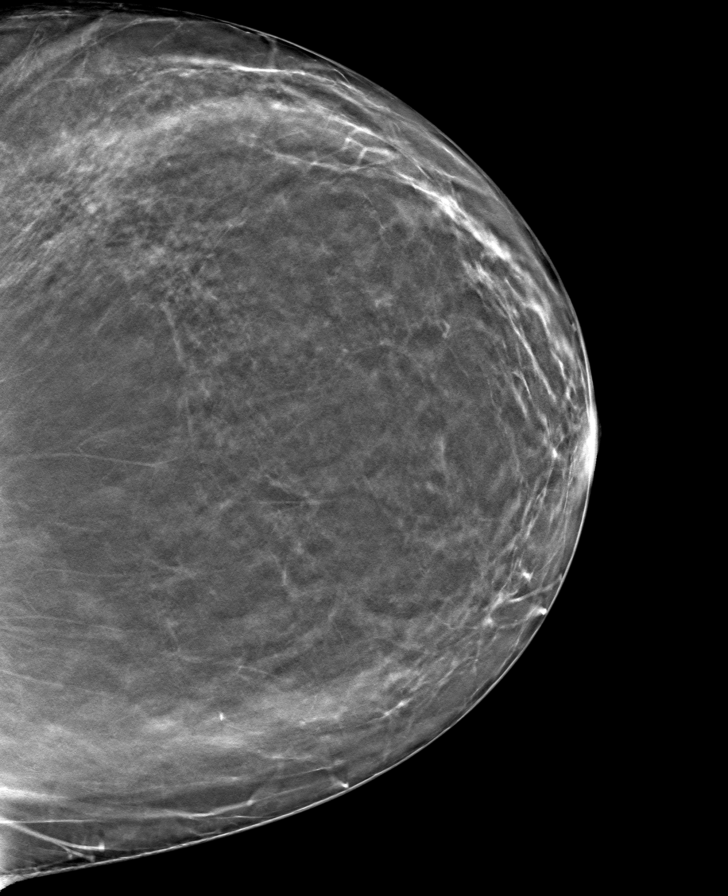

[R CC tomo · tomo slice 41/80.0]
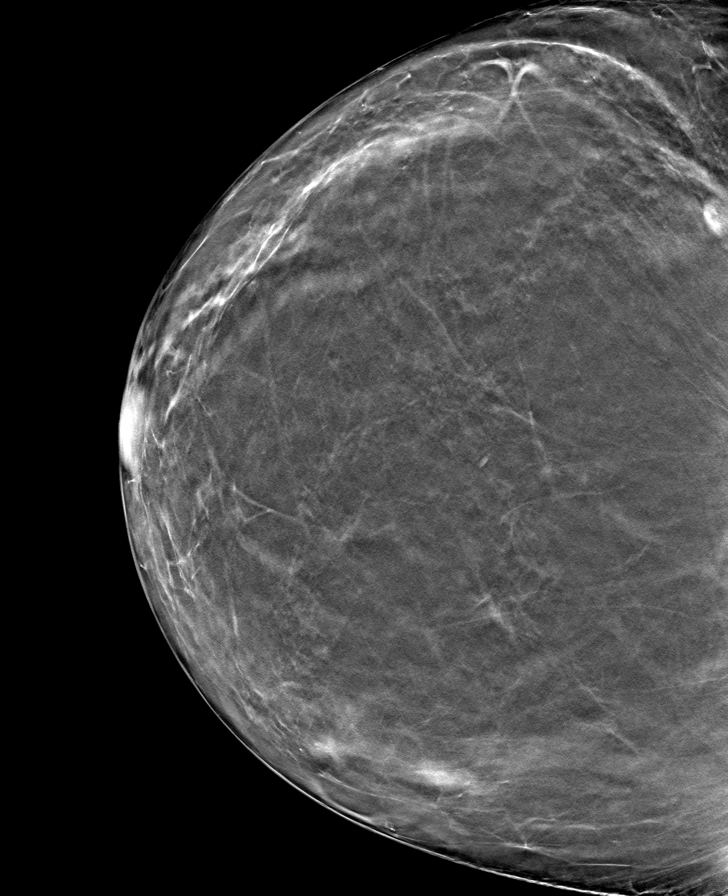

[8 of 24 positions shown; findings below may reference images not displayed]

ACR Breast Density Category b: There are scattered areas of
fibroglandular density.
FINDINGS: There are no findings suspicious for malignancy. Images were
processed with CAD.
IMPRESSION: No mammographic evidence of malignancy. A result letter of this
screening mammogram will be mailed directly to the patient.

RECOMMENDATION:
Screening mammogram in one year. (Code:[id number])

BI-RADS CATEGORY  1: Negative.

## 2019-09-26 ENCOUNTER — Ambulatory Visit: Payer: PPO

## 2019-09-27 ENCOUNTER — Other Ambulatory Visit: Payer: Self-pay

## 2019-09-28 ENCOUNTER — Ambulatory Visit (INDEPENDENT_AMBULATORY_CARE_PROVIDER_SITE_OTHER): Payer: PPO

## 2019-09-28 DIAGNOSIS — E538 Deficiency of other specified B group vitamins: Secondary | ICD-10-CM

## 2019-09-28 MED ORDER — CYANOCOBALAMIN 1000 MCG/ML IJ SOLN
1000.0000 ug | Freq: Once | INTRAMUSCULAR | Status: AC
  Administered 2019-09-28: 1000 ug via INTRAMUSCULAR

## 2019-09-28 NOTE — Progress Notes (Signed)
Per orders of Dr.Fry, injection of B12 given in deltoid by Franco Collet. Patient tolerated injection well.

## 2019-09-28 NOTE — Patient Instructions (Signed)
Health Maintenance Due  Topic Date Due  . COVID-19 Vaccine (1) Never done  . COLONOSCOPY  Never done  . PAP SMEAR-Modifier  05/01/2019    No flowsheet data found.

## 2019-11-07 ENCOUNTER — Other Ambulatory Visit: Payer: Self-pay | Admitting: Family Medicine

## 2019-11-22 ENCOUNTER — Ambulatory Visit (INDEPENDENT_AMBULATORY_CARE_PROVIDER_SITE_OTHER): Payer: PPO

## 2019-11-22 ENCOUNTER — Other Ambulatory Visit: Payer: Self-pay

## 2019-11-22 DIAGNOSIS — E538 Deficiency of other specified B group vitamins: Secondary | ICD-10-CM

## 2019-11-22 MED ORDER — CYANOCOBALAMIN 1000 MCG/ML IJ SOLN
1000.0000 ug | Freq: Once | INTRAMUSCULAR | Status: AC
  Administered 2019-11-22: 1000 ug via INTRAMUSCULAR

## 2019-11-22 NOTE — Patient Instructions (Signed)
Health Maintenance Due  Topic Date Due  . Hepatitis C Screening  Never done  . COVID-19 Vaccine (1) Never done  . COLONOSCOPY  Never done  . PAP SMEAR-Modifier  05/01/2019    No flowsheet data found.

## 2019-11-22 NOTE — Progress Notes (Signed)
Per orders of Dr.Fry , injection of B12 given in R deltoid by Franco Collet. Patient tolerated injection well.

## 2019-12-07 DIAGNOSIS — D251 Intramural leiomyoma of uterus: Secondary | ICD-10-CM | POA: Diagnosis not present

## 2019-12-07 DIAGNOSIS — D259 Leiomyoma of uterus, unspecified: Secondary | ICD-10-CM | POA: Diagnosis not present

## 2019-12-07 DIAGNOSIS — G35 Multiple sclerosis: Secondary | ICD-10-CM | POA: Diagnosis not present

## 2019-12-07 DIAGNOSIS — N951 Menopausal and female climacteric states: Secondary | ICD-10-CM | POA: Diagnosis not present

## 2019-12-07 DIAGNOSIS — D252 Subserosal leiomyoma of uterus: Secondary | ICD-10-CM | POA: Diagnosis not present

## 2019-12-19 ENCOUNTER — Encounter: Payer: Self-pay | Admitting: Family Medicine

## 2019-12-19 ENCOUNTER — Other Ambulatory Visit: Payer: Self-pay

## 2019-12-19 ENCOUNTER — Ambulatory Visit (INDEPENDENT_AMBULATORY_CARE_PROVIDER_SITE_OTHER): Payer: PPO | Admitting: Family Medicine

## 2019-12-19 VITALS — BP 142/80 | HR 83 | Temp 98.7°F | Wt 251.4 lb

## 2019-12-19 DIAGNOSIS — M5441 Lumbago with sciatica, right side: Secondary | ICD-10-CM

## 2019-12-19 DIAGNOSIS — I10 Essential (primary) hypertension: Secondary | ICD-10-CM | POA: Diagnosis not present

## 2019-12-19 DIAGNOSIS — M5442 Lumbago with sciatica, left side: Secondary | ICD-10-CM | POA: Diagnosis not present

## 2019-12-19 MED ORDER — METHYLPREDNISOLONE ACETATE 40 MG/ML IJ SUSP
40.0000 mg | Freq: Once | INTRAMUSCULAR | Status: AC
  Administered 2019-12-19: 40 mg via INTRAMUSCULAR

## 2019-12-19 MED ORDER — METHYLPREDNISOLONE ACETATE 80 MG/ML IJ SUSP
80.0000 mg | Freq: Once | INTRAMUSCULAR | Status: AC
  Administered 2019-12-19: 80 mg via INTRAMUSCULAR

## 2019-12-19 NOTE — Progress Notes (Signed)
   Subjective:    Patient ID: [name], female    DOB: 09/02/1965, 54 y.o.   MRN: [id number]  HPI Here to ask for a handicapped sticker for her car. She had been scheduled for a facet injection per Dr. Sheffield Slider at [facility] on 08-09-19, but this was cancelled due to a very high BP. We had prescribed her Propranolol to take prior to procedures, but she forgot to take it that morning. She plans to reschedule this once her husband's health isses settle down. He is scheduled for surgery later this week.    Review of Systems  Constitutional: Negative.   Respiratory: Negative.   Cardiovascular: Negative.   Musculoskeletal: Positive for back pain.       Objective:   Physical Exam Constitutional:      Appearance: Normal appearance.  Cardiovascular:     Rate and Rhythm: Normal rate and regular rhythm.     Pulses: Normal pulses.     Heart sounds: Normal heart sounds.  Pulmonary:     Effort: Pulmonary effort is normal.     Breath sounds: Normal breath sounds.  Neurological:     Mental Status: She is alert.           Assessment & Plan:  Her HTN is controlled except for her white coat syndrome. She will reschedule her injection once her husband has recovered from surgery. We signed a DMV form for her to turn in.  Alysia Penna, MD

## 2019-12-19 NOTE — Addendum Note (Signed)
Addended by: Rebecca Eaton on: 12/19/2019 02:27 PM   Modules accepted: Orders

## 2020-01-02 ENCOUNTER — Ambulatory Visit: Payer: PPO

## 2020-01-04 ENCOUNTER — Ambulatory Visit (INDEPENDENT_AMBULATORY_CARE_PROVIDER_SITE_OTHER): Payer: PPO | Admitting: *Deleted

## 2020-01-04 ENCOUNTER — Other Ambulatory Visit: Payer: Self-pay

## 2020-01-04 DIAGNOSIS — E538 Deficiency of other specified B group vitamins: Secondary | ICD-10-CM

## 2020-01-04 MED ORDER — CYANOCOBALAMIN 1000 MCG/ML IJ SOLN
1000.0000 ug | Freq: Once | INTRAMUSCULAR | Status: AC
  Administered 2020-01-04: 1000 ug via INTRAMUSCULAR

## 2020-01-04 NOTE — Progress Notes (Signed)
Per orders of Dr. Sarajane Jews, injection of Cyanocobalamin 1000mg  given by Agnes Lawrence. Patient tolerated injection well.

## 2020-01-11 DIAGNOSIS — G35 Multiple sclerosis: Secondary | ICD-10-CM | POA: Diagnosis not present

## 2020-01-11 DIAGNOSIS — R238 Other skin changes: Secondary | ICD-10-CM | POA: Diagnosis not present

## 2020-02-06 ENCOUNTER — Other Ambulatory Visit: Payer: Self-pay

## 2020-02-06 ENCOUNTER — Ambulatory Visit (INDEPENDENT_AMBULATORY_CARE_PROVIDER_SITE_OTHER): Payer: PPO | Admitting: *Deleted

## 2020-02-06 DIAGNOSIS — E538 Deficiency of other specified B group vitamins: Secondary | ICD-10-CM | POA: Diagnosis not present

## 2020-02-06 MED ORDER — CYANOCOBALAMIN 1000 MCG/ML IJ SOLN
1000.0000 ug | Freq: Once | INTRAMUSCULAR | Status: AC
  Administered 2020-02-06: 1000 ug via INTRAMUSCULAR

## 2020-02-06 NOTE — Progress Notes (Signed)
Per orders of Cory Nafziger, NP, injection of Cyanocobalamin 1000mcg given by Issaih Kaus A. Patient tolerated injection well. 

## 2020-03-07 DIAGNOSIS — H40013 Open angle with borderline findings, low risk, bilateral: Secondary | ICD-10-CM | POA: Diagnosis not present

## 2020-03-15 ENCOUNTER — Ambulatory Visit: Payer: PPO | Admitting: Family Medicine

## 2020-03-19 ENCOUNTER — Encounter: Payer: Self-pay | Admitting: Family Medicine

## 2020-03-19 ENCOUNTER — Other Ambulatory Visit: Payer: Self-pay

## 2020-03-19 ENCOUNTER — Ambulatory Visit (INDEPENDENT_AMBULATORY_CARE_PROVIDER_SITE_OTHER): Payer: PPO | Admitting: Family Medicine

## 2020-03-19 VITALS — BP 146/112 | HR 77 | Temp 98.7°F | Ht 62.99 in | Wt 251.0 lb

## 2020-03-19 DIAGNOSIS — M5441 Lumbago with sciatica, right side: Secondary | ICD-10-CM

## 2020-03-19 DIAGNOSIS — M5442 Lumbago with sciatica, left side: Secondary | ICD-10-CM | POA: Diagnosis not present

## 2020-03-19 DIAGNOSIS — H359 Unspecified retinal disorder: Secondary | ICD-10-CM

## 2020-03-19 DIAGNOSIS — G35 Multiple sclerosis: Secondary | ICD-10-CM | POA: Diagnosis not present

## 2020-03-19 DIAGNOSIS — H40053 Ocular hypertension, bilateral: Secondary | ICD-10-CM | POA: Diagnosis not present

## 2020-03-19 DIAGNOSIS — I10 Essential (primary) hypertension: Secondary | ICD-10-CM

## 2020-03-19 DIAGNOSIS — G35D Multiple sclerosis, unspecified: Secondary | ICD-10-CM

## 2020-03-19 DIAGNOSIS — E538 Deficiency of other specified B group vitamins: Secondary | ICD-10-CM

## 2020-03-19 MED ORDER — CYANOCOBALAMIN 1000 MCG/ML IJ SOLN
1000.0000 ug | Freq: Once | INTRAMUSCULAR | Status: AC
  Administered 2020-03-19: 1000 ug via INTRAMUSCULAR

## 2020-03-19 MED ORDER — METHYLPREDNISOLONE ACETATE 40 MG/ML IJ SUSP
120.0000 mg | Freq: Once | INTRAMUSCULAR | Status: AC
  Administered 2020-03-19: 120 mg via INTRAMUSCULAR

## 2020-03-19 MED ORDER — LISINOPRIL 10 MG PO TABS: 10.0000 mg | ORAL_TABLET | Freq: Every day | ORAL | 3 refills | Status: DC

## 2020-03-19 NOTE — Progress Notes (Signed)
   Subjective:    Patient ID: [name], female    DOB: June 05, 1965, 54 y.o.   MRN: [id number]  HPI Here for several issues. First her BP has been running a little high. She does not check it at home. She recently went to [name] [facility] for an exam, and she was told her BP was high. They also found the intraocular pressures in both eyes to be mildly elevated, so they are going to see her back next month for a follow up check. They also saw a spot on her right retina that worried them that she may have plaque in her carotid artery. They recommended she have an [place] study. She says her vision is normal. She had complete labs in August at the [facility] office, and these were unremarkable. She also asks for a steroid shot to help her back pain.   Review of Systems  Constitutional: Negative.   Eyes: Negative.   Respiratory: Negative.   Cardiovascular: Negative.   Musculoskeletal: Positive for back pain.       Objective:   Physical Exam Constitutional:      Appearance: Normal appearance. She is obese.  Eyes:     Extraocular Movements: Extraocular movements intact.     Conjunctiva/sclera: Conjunctivae normal.     Pupils: Pupils are equal, round, and reactive to light.  Neck:     Vascular: No carotid bruit.  Cardiovascular:     Rate and Rhythm: Normal rate and regular rhythm.     Pulses: Normal pulses.     Heart sounds: Normal heart sounds.  Pulmonary:     Effort: Pulmonary effort is normal.     Breath sounds: Normal breath sounds.  Lymphadenopathy:     Cervical: No cervical adenopathy.  Neurological:     General: No focal deficit present.     Mental Status: She is alert and oriented to person, place, and time.           Assessment & Plan:  For the HTN, she will start on Lisinopril 10 mg daily. I asked her to check her BP at home twice a week and report back to [place] in 3-4 weeks. For the right retina lesion, we will set up carotid dopplers. She will follow up with  the optometrist for elevated intraocular pressures. She is given a shot of DepoMedrol for back pain. Alysia Penna, MD

## 2020-03-19 NOTE — Addendum Note (Signed)
Addended by: Ezequiel Ganser on: 03/19/2020 10:28 AM   Modules accepted: Orders

## 2020-03-28 ENCOUNTER — Ambulatory Visit (HOSPITAL_COMMUNITY)
Admission: RE | Admit: 2020-03-28 | Discharge: 2020-03-28 | Disposition: A | Discharge status: Home / Self Care | Payer: PPO | Source: Ambulatory Visit | Attending: Cardiology | Admitting: Cardiology

## 2020-03-28 ENCOUNTER — Other Ambulatory Visit: Payer: Self-pay

## 2020-03-28 DIAGNOSIS — H359 Unspecified retinal disorder: Secondary | ICD-10-CM | POA: Diagnosis not present

## 2020-03-28 DIAGNOSIS — H53122 Transient visual loss, left eye: Secondary | ICD-10-CM

## 2020-03-29 IMAGING — US US PELVIS COMPLETE WITH TRANSVAGINAL
1 series · 13 of 25 positions shown · non-contrast
Comparison: None

CLINICAL DATA: RIGHT lower quadrant pain for several months,
history of fibroids removed 17 years ago, now perimenopausal

EXAM:
TRANSABDOMINAL AND TRANSVAGINAL ULTRASOUND OF PELVIS
TECHNIQUE: Both transabdominal and transvaginal ultrasound examinations of the
pelvis were performed. Transabdominal technique was performed for
global imaging of the pelvis including uterus, ovaries, adnexal
regions, and pelvic cul-de-sac. It was necessary to proceed with
endovaginal exam following the transabdominal exam to visualize the
endometrium and ovaries.

[Series 1: us pelvis complete with transvaginal · 0.28mm/px · 91 acquisitions, 13 frames shown]
[im 1/91]
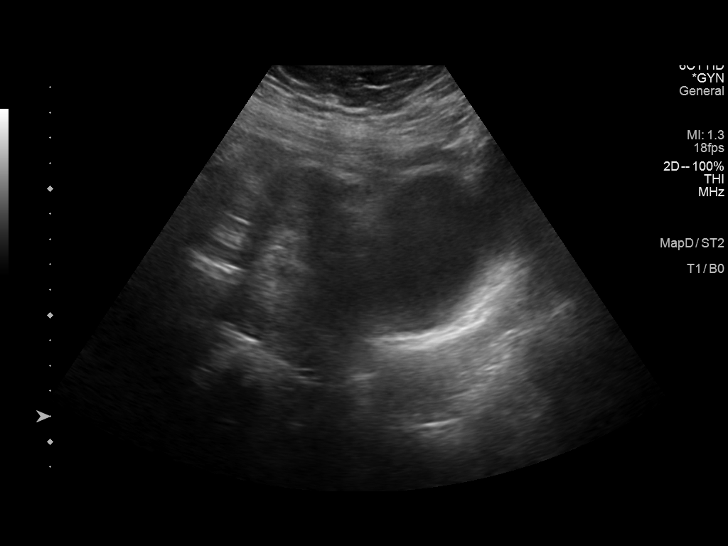
[im 8/91]
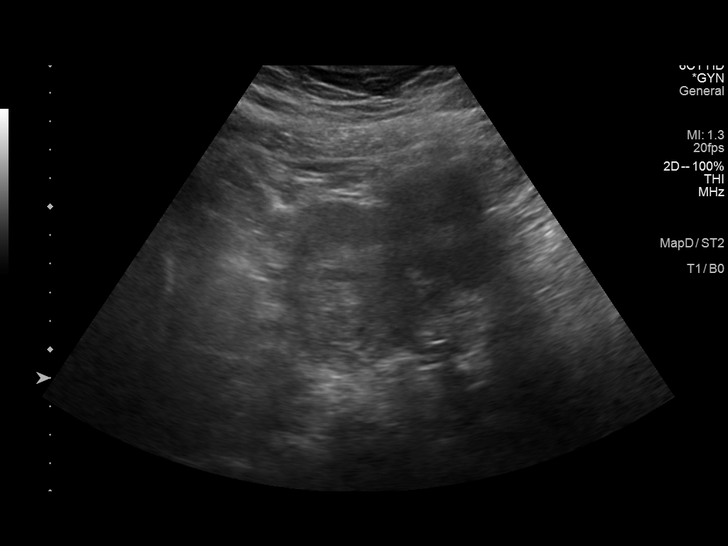
[im 16/91]
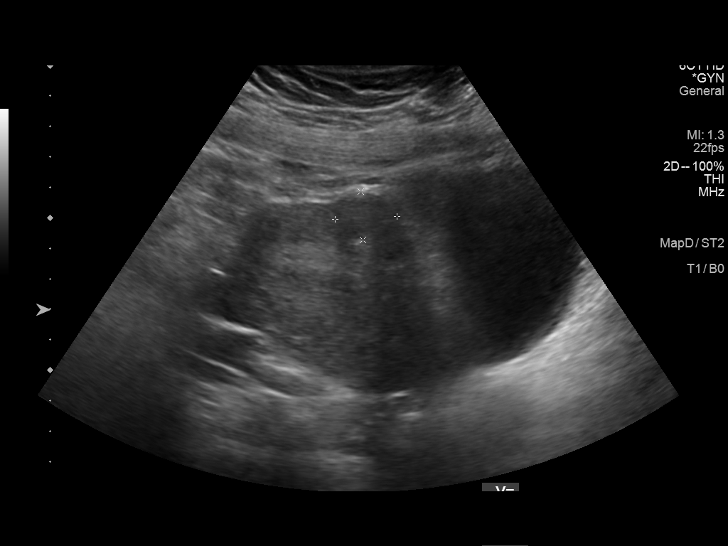
[im 23/91]
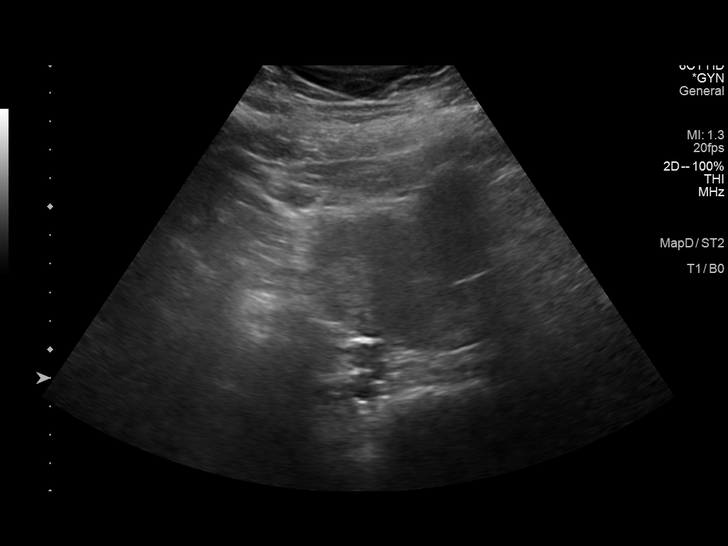
[im 31/91]
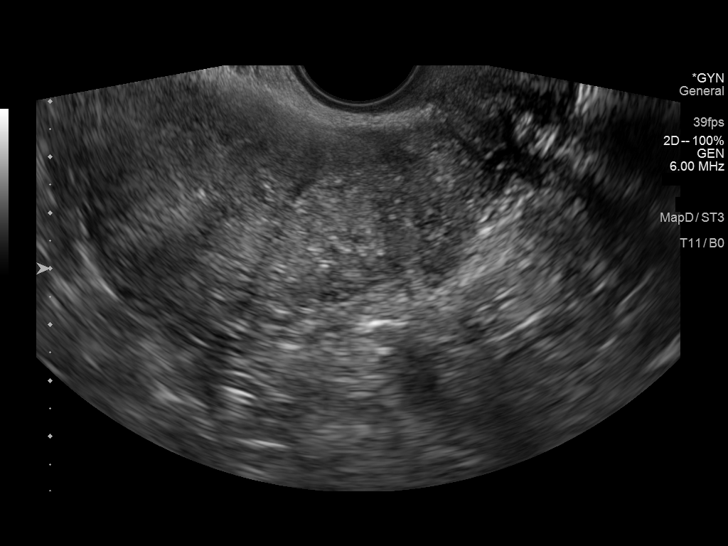
[im 38/91]
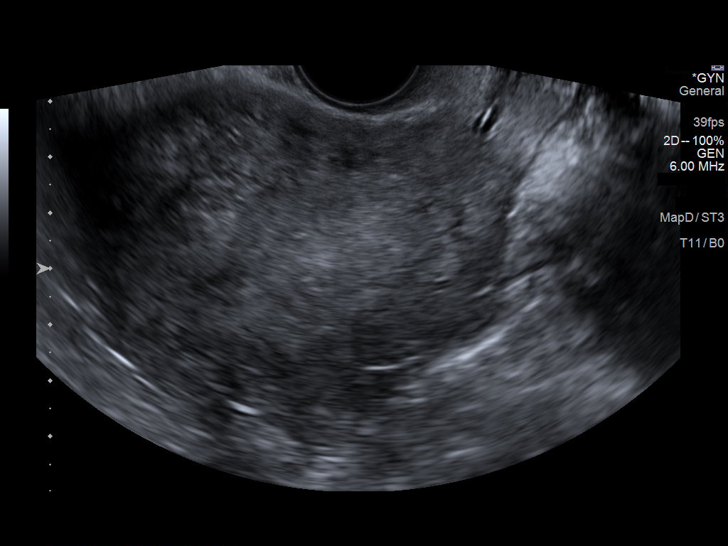
[im 46/91]
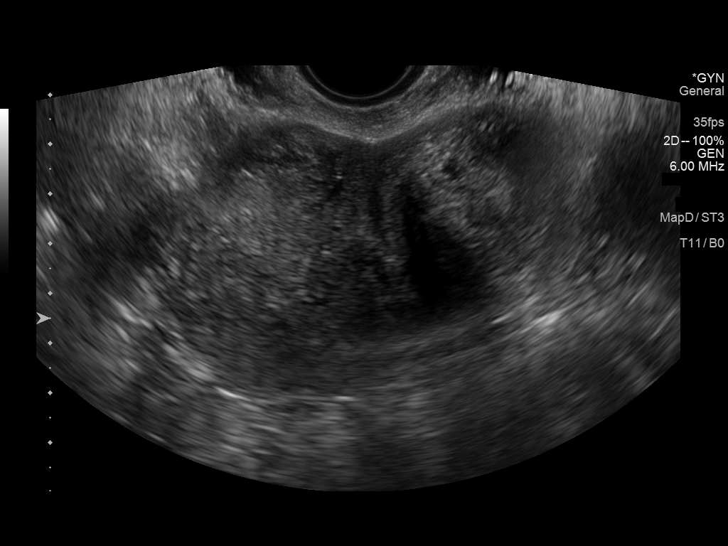
[im 53/91]
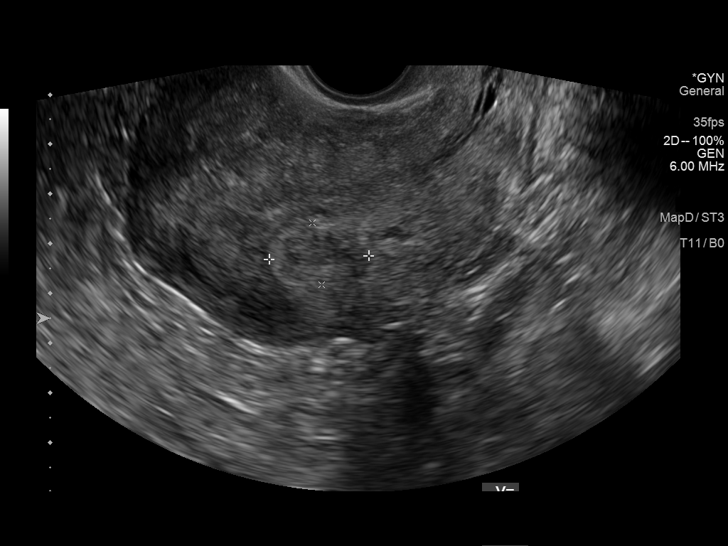
[im 61/91]
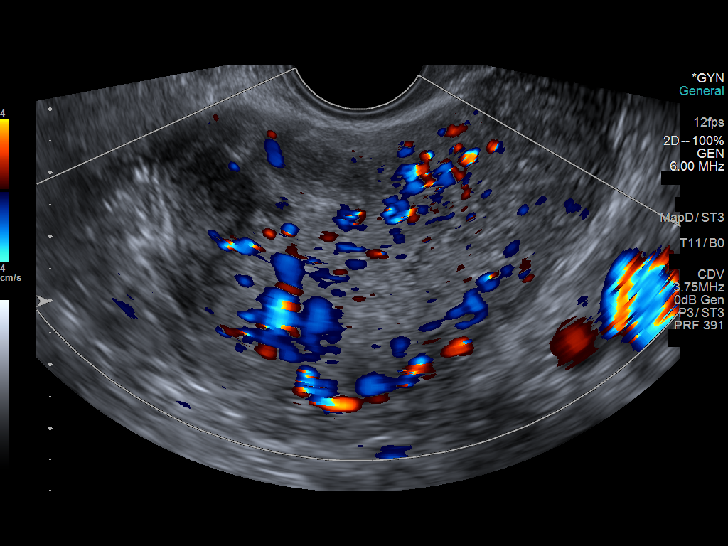
[im 68/91]
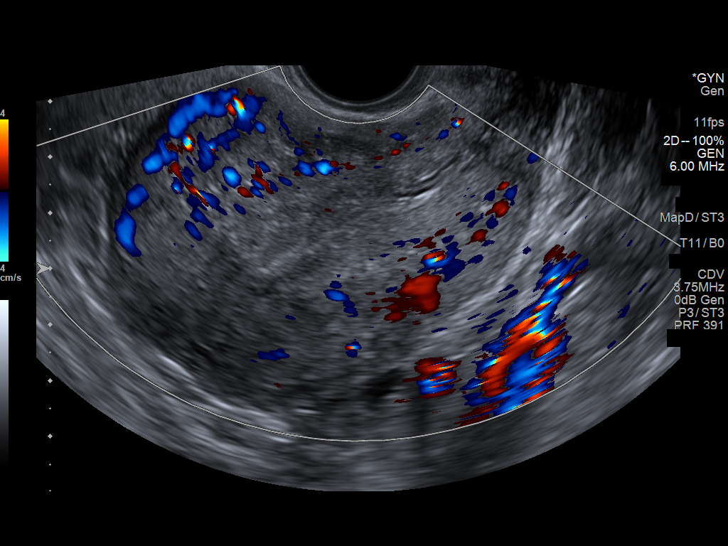
[im 76/91]
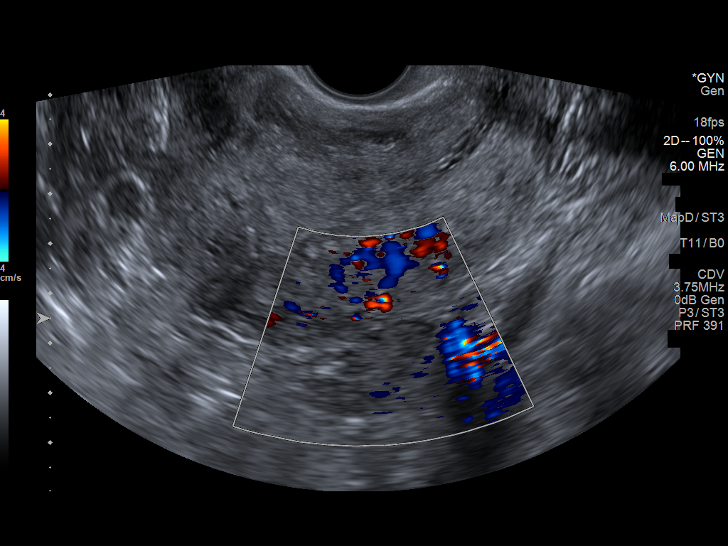
[im 83/91]
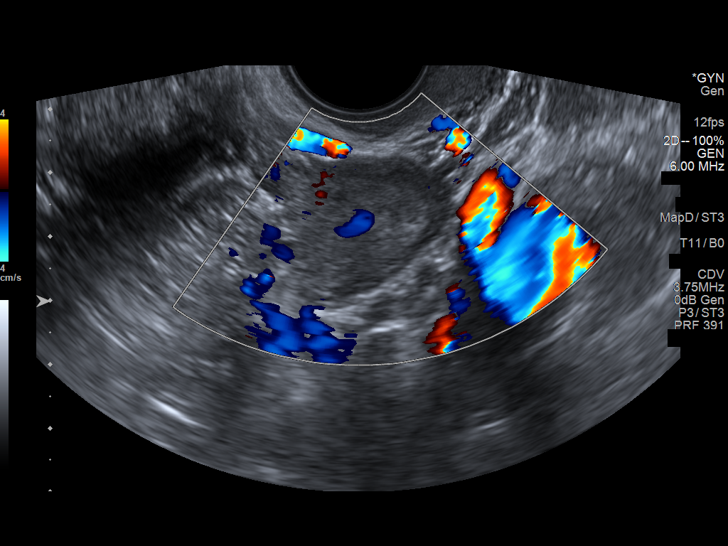
[im 91/91]
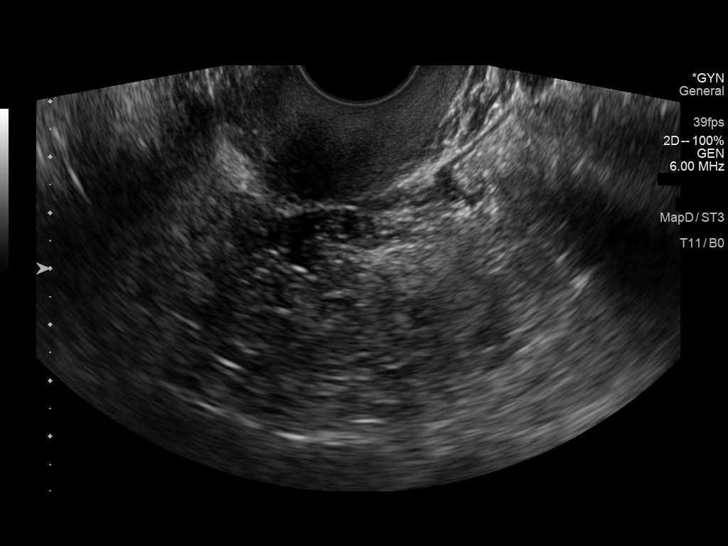

[13 of 25 positions shown; findings below may reference images not displayed]

FINDINGS: Uterus

Measurements: 11.5 x 5.7 x 4.6 cm = volume: 158 mL. Heterogeneous
echogenicity. Several small uterine leiomyomata are identified.
Large 2 of these is a LEFT lateral leiomyoma 5.2 x 3.9 x 4.8 cm,
subserosal/exophytic. Small submucosal leiomyoma at upper uterine
segment posteriorly 2.0 x 1.3 x 1.8 cm. Two small intramural
leiomyomata are seen at the anterior wall of the uterus, largest
cm in greatest size.

Endometrium

Thickness: 3 mm. Small amount of endometrial fluid at mid uterus. No
focal endometrial abnormality.

Right ovary

Measurements: 2.7 x 1.4 x 1.4 cm = volume: 2.7 mL. Normal morphology
without mass

Left ovary

Measurements: 3.0 x 2.7 x 1.5 cm = volume: 6.4 mL. Normal morphology
without mass

Other findings

Trace free pelvic fluid.  No adnexal masses.
IMPRESSION: Multiple uterine leiomyomata largest of which is 5.2 cm in greatest
size exophytic at the LEFT lateral aspect of the uterus.

Small 2.0 cm diameter submucosal leiomyoma at upper uterine segment
posteriorly.

Unremarkable ovaries/adnexa.

## 2020-04-09 DIAGNOSIS — H40013 Open angle with borderline findings, low risk, bilateral: Secondary | ICD-10-CM | POA: Diagnosis not present

## 2020-04-17 ENCOUNTER — Other Ambulatory Visit: Payer: Self-pay | Admitting: Family Medicine

## 2020-05-14 ENCOUNTER — Telehealth: Payer: Self-pay | Admitting: Family Medicine

## 2020-05-14 NOTE — Telephone Encounter (Signed)
Patient wants a call back today from Dr. Claris Che nurse.  She got scratched under her eye by her dog and has a question.  Patient is making an appt--doesn't want to talk to nurse triage, only to Dr Claris Che nurse.

## 2020-05-21 ENCOUNTER — Ambulatory Visit: Payer: PPO

## 2020-05-21 NOTE — Progress Notes (Signed)
Erroneous Encounter

## 2020-06-17 ENCOUNTER — Other Ambulatory Visit: Payer: Self-pay | Admitting: Family Medicine

## 2020-07-06 DIAGNOSIS — K209 Esophagitis, unspecified without bleeding: Secondary | ICD-10-CM | POA: Diagnosis not present

## 2020-07-06 DIAGNOSIS — I10 Essential (primary) hypertension: Secondary | ICD-10-CM | POA: Diagnosis not present

## 2020-07-06 DIAGNOSIS — G35 Multiple sclerosis: Secondary | ICD-10-CM | POA: Diagnosis not present

## 2020-07-06 DIAGNOSIS — R059 Cough, unspecified: Secondary | ICD-10-CM | POA: Diagnosis not present

## 2020-07-06 DIAGNOSIS — D500 Iron deficiency anemia secondary to blood loss (chronic): Secondary | ICD-10-CM | POA: Diagnosis not present

## 2020-07-06 DIAGNOSIS — K922 Gastrointestinal hemorrhage, unspecified: Secondary | ICD-10-CM | POA: Diagnosis not present

## 2020-07-06 DIAGNOSIS — K317 Polyp of stomach and duodenum: Secondary | ICD-10-CM | POA: Diagnosis not present

## 2020-07-06 DIAGNOSIS — J9811 Atelectasis: Secondary | ICD-10-CM | POA: Diagnosis not present

## 2020-07-06 DIAGNOSIS — D62 Acute posthemorrhagic anemia: Secondary | ICD-10-CM | POA: Diagnosis not present

## 2020-07-06 DIAGNOSIS — K449 Diaphragmatic hernia without obstruction or gangrene: Secondary | ICD-10-CM | POA: Diagnosis not present

## 2020-07-06 DIAGNOSIS — R1111 Vomiting without nausea: Secondary | ICD-10-CM | POA: Diagnosis not present

## 2020-07-06 DIAGNOSIS — Z20822 Contact with and (suspected) exposure to covid-19: Secondary | ICD-10-CM | POA: Diagnosis not present

## 2020-07-06 DIAGNOSIS — K2950 Unspecified chronic gastritis without bleeding: Secondary | ICD-10-CM | POA: Diagnosis not present

## 2020-07-06 DIAGNOSIS — K2210 Ulcer of esophagus without bleeding: Secondary | ICD-10-CM | POA: Diagnosis not present

## 2020-07-06 DIAGNOSIS — R1310 Dysphagia, unspecified: Secondary | ICD-10-CM | POA: Diagnosis not present

## 2020-07-06 DIAGNOSIS — R110 Nausea: Secondary | ICD-10-CM | POA: Diagnosis not present

## 2020-07-06 DIAGNOSIS — R9431 Abnormal electrocardiogram [ECG] [EKG]: Secondary | ICD-10-CM | POA: Diagnosis not present

## 2020-07-06 DIAGNOSIS — K208 Other esophagitis without bleeding: Secondary | ICD-10-CM | POA: Diagnosis not present

## 2020-07-06 DIAGNOSIS — K920 Hematemesis: Secondary | ICD-10-CM | POA: Diagnosis not present

## 2020-07-06 DIAGNOSIS — R000 Tachycardia, unspecified: Secondary | ICD-10-CM | POA: Diagnosis not present

## 2020-07-06 DIAGNOSIS — K2100 Gastro-esophageal reflux disease with esophagitis, without bleeding: Secondary | ICD-10-CM | POA: Diagnosis not present

## 2020-07-06 DIAGNOSIS — R112 Nausea with vomiting, unspecified: Secondary | ICD-10-CM | POA: Diagnosis not present

## 2020-07-06 DIAGNOSIS — K2211 Ulcer of esophagus with bleeding: Secondary | ICD-10-CM | POA: Diagnosis not present

## 2020-07-06 DIAGNOSIS — R52 Pain, unspecified: Secondary | ICD-10-CM | POA: Diagnosis not present

## 2020-07-06 DIAGNOSIS — N179 Acute kidney failure, unspecified: Secondary | ICD-10-CM | POA: Diagnosis not present

## 2020-07-15 ENCOUNTER — Other Ambulatory Visit: Payer: Self-pay

## 2020-07-15 ENCOUNTER — Encounter: Payer: Self-pay | Admitting: Family Medicine

## 2020-07-15 ENCOUNTER — Ambulatory Visit (INDEPENDENT_AMBULATORY_CARE_PROVIDER_SITE_OTHER): Payer: PPO | Admitting: Family Medicine

## 2020-07-15 VITALS — BP 120/78 | HR 102 | Temp 97.6°F | Wt 244.0 lb

## 2020-07-15 DIAGNOSIS — E538 Deficiency of other specified B group vitamins: Secondary | ICD-10-CM

## 2020-07-15 DIAGNOSIS — I10 Essential (primary) hypertension: Secondary | ICD-10-CM

## 2020-07-15 DIAGNOSIS — K2101 Gastro-esophageal reflux disease with esophagitis, with bleeding: Secondary | ICD-10-CM | POA: Diagnosis not present

## 2020-07-15 DIAGNOSIS — R059 Cough, unspecified: Secondary | ICD-10-CM

## 2020-07-15 DIAGNOSIS — N179 Acute kidney failure, unspecified: Secondary | ICD-10-CM

## 2020-07-15 DIAGNOSIS — K922 Gastrointestinal hemorrhage, unspecified: Secondary | ICD-10-CM

## 2020-07-15 MED ORDER — FUROSEMIDE 40 MG PO TABS: 40.0000 mg | ORAL_TABLET | Freq: Every day | ORAL | 3 refills | Status: DC | PRN

## 2020-07-15 MED ORDER — PANTOPRAZOLE SODIUM 40 MG PO TBEC: 40.0000 mg | DELAYED_RELEASE_TABLET | Freq: Two times a day (BID) | ORAL | 3 refills | Status: DC

## 2020-07-15 MED ORDER — CYANOCOBALAMIN 1000 MCG/ML IJ SOLN
1000.0000 ug | Freq: Once | INTRAMUSCULAR | Status: AC
  Administered 2020-07-15: 1000 ug via INTRAMUSCULAR

## 2020-07-15 MED ORDER — SUCRALFATE 1 GM/10ML PO SUSP: 1.0000 g | Freq: Three times a day (TID) | ORAL | 0 refills | Status: DC

## 2020-07-15 MED ORDER — FAMOTIDINE 40 MG/5ML PO SUSR: 40.0000 mg | Freq: Two times a day (BID) | ORAL | 3 refills | Status: DC

## 2020-07-15 MED ORDER — HYDROCODONE-HOMATROPINE 5-1.5 MG/5ML PO SYRP: 5.0000 mL | ORAL_SOLUTION | ORAL | 0 refills | Status: DC | PRN

## 2020-07-15 NOTE — Progress Notes (Signed)
   Subjective:    Patient ID: [name], female    DOB: [birth year], 55 y.o.   MRN: [id number]  HPI Here to follow up a hospital stay at [facility] from 07-06-20 to 07-11-20 for an upper GI bleed. She has a long hx of GERD but she had stopped taking all acid blockers due to expense. She presented with hematemesis and abdominal pain. She has also had a dry cough for several weeks. She tested negative for Covid-19. Her Hgb remained stable at 13.3. her CXR was clear. She had some AKI with a creatinine of 1.27, but this went down to 1.04 with hydration. An EGD revealed esophageal ulcers. She was sent home on Pantoprazole, Famotidine, and Sucralfate suspension. She is not longer taking Meloxicam or any NSAIDs. She was taken off Losartan and started on HCTZ, but her BP has been stable at home so she has not taken any HCTZ. She feels much better. The abdominal pain and nausea have resolved. She is able to eat a normal diet. She still has the dry cough, though it is slowly improving.    Review of Systems  Constitutional: Negative.   Respiratory: Positive for cough. Negative for chest tightness, shortness of breath and wheezing.   Cardiovascular: Negative.   Gastrointestinal: Negative.        Objective:   Physical Exam Constitutional:      Appearance: Normal appearance. She is not ill-appearing.  Cardiovascular:     Rate and Rhythm: Normal rate and regular rhythm.     Pulses: Normal pulses.     Heart sounds: Normal heart sounds.  Pulmonary:     Effort: Pulmonary effort is normal.     Breath sounds: Normal breath sounds.  Abdominal:     General: Abdomen is flat. Bowel sounds are normal. There is no distension.     Palpations: Abdomen is soft. There is no mass.     Tenderness: There is no abdominal tenderness. There is no guarding or rebound.     Hernia: No hernia is present.  Neurological:     Mental Status: She is alert.           Assessment & Plan:  She is  recovering from an upper GI bleed, and she will take Pantoprazole, Famotidine, and Sucralfate. She is to have a follow up EGD in 3 months. Her cough is likely from upper airway inflammation related to GERD, and this should resolve over there next few weeks. She can use Hydromet in the meantime for relief. Her BP is stable so we will withhold the HCTZ for now. She will monitor this at home.  Alysia Penna, MD

## 2020-07-18 ENCOUNTER — Inpatient Hospital Stay: Payer: PPO | Admitting: Family Medicine

## 2020-07-19 ENCOUNTER — Telehealth: Payer: Self-pay

## 2020-07-19 NOTE — Telephone Encounter (Signed)
Pt disability placard has been completed, pt notified to pick up form in the office, state that she will pick up on Monday. Form has been placed at the front office cabinet.

## 2020-08-01 DIAGNOSIS — G35 Multiple sclerosis: Secondary | ICD-10-CM | POA: Diagnosis not present

## 2020-09-01 DIAGNOSIS — I10 Essential (primary) hypertension: Secondary | ICD-10-CM | POA: Diagnosis not present

## 2020-09-01 DIAGNOSIS — M6281 Muscle weakness (generalized): Secondary | ICD-10-CM | POA: Diagnosis not present

## 2020-09-01 DIAGNOSIS — R55 Syncope and collapse: Secondary | ICD-10-CM | POA: Diagnosis not present

## 2020-09-01 DIAGNOSIS — R569 Unspecified convulsions: Secondary | ICD-10-CM | POA: Diagnosis not present

## 2020-09-01 DIAGNOSIS — R519 Headache, unspecified: Secondary | ICD-10-CM | POA: Diagnosis not present

## 2020-09-01 DIAGNOSIS — G35 Multiple sclerosis: Secondary | ICD-10-CM | POA: Diagnosis not present

## 2020-09-01 DIAGNOSIS — R9431 Abnormal electrocardiogram [ECG] [EKG]: Secondary | ICD-10-CM | POA: Diagnosis not present

## 2020-09-01 DIAGNOSIS — R0902 Hypoxemia: Secondary | ICD-10-CM | POA: Diagnosis not present

## 2020-09-01 DIAGNOSIS — G40909 Epilepsy, unspecified, not intractable, without status epilepticus: Secondary | ICD-10-CM | POA: Diagnosis not present

## 2020-09-01 DIAGNOSIS — R4020 Unspecified coma: Secondary | ICD-10-CM | POA: Diagnosis not present

## 2020-09-01 DIAGNOSIS — Z79899 Other long term (current) drug therapy: Secondary | ICD-10-CM | POA: Diagnosis not present

## 2020-09-01 DIAGNOSIS — R42 Dizziness and giddiness: Secondary | ICD-10-CM | POA: Diagnosis not present

## 2020-09-01 DIAGNOSIS — R404 Transient alteration of awareness: Secondary | ICD-10-CM | POA: Diagnosis not present

## 2020-09-02 DIAGNOSIS — R9431 Abnormal electrocardiogram [ECG] [EKG]: Secondary | ICD-10-CM | POA: Diagnosis not present

## 2020-09-11 ENCOUNTER — Ambulatory Visit: Payer: PPO

## 2020-09-11 ENCOUNTER — Other Ambulatory Visit: Payer: Self-pay

## 2020-09-11 DIAGNOSIS — Z0000 Encounter for general adult medical examination without abnormal findings: Secondary | ICD-10-CM

## 2020-09-24 DIAGNOSIS — Z1211 Encounter for screening for malignant neoplasm of colon: Secondary | ICD-10-CM | POA: Diagnosis not present

## 2020-09-24 DIAGNOSIS — K2210 Ulcer of esophagus without bleeding: Secondary | ICD-10-CM | POA: Diagnosis not present

## 2020-09-24 DIAGNOSIS — K2100 Gastro-esophageal reflux disease with esophagitis, without bleeding: Secondary | ICD-10-CM | POA: Diagnosis not present

## 2020-09-30 ENCOUNTER — Ambulatory Visit: Payer: PPO

## 2020-09-30 ENCOUNTER — Telehealth: Payer: Self-pay

## 2020-09-30 NOTE — Progress Notes (Incomplete)
Subjective:   [name] is a 55 y.o. female who presents for an Initial Medicare Annual Wellness Visit.  Virtual Visit via Video Note  I connected with [name] by a video enabled telemedicine application and verified that I am speaking with the correct person using two identifiers.  Location: Patient: Home Provider: Office Persons participating in the virtual visit: patient, provider   I discussed the limitations of evaluation and management by telemedicine and the availability of in person appointments. The patient expressed understanding and agreed to proceed.     Randel Pigg ,LPN   Review of Systems    n/a       Objective:    There were no vitals filed for this visit. There is no height or weight on file to calculate BMI.  Advanced Directives 06/02/2018 11/29/2016 10/04/2016 07/21/2015 11/29/2014 06/27/2014 06/27/2014  Does Patient Have a Medical Advance Directive? No No No No No No No  Would patient like information on creating a medical advance directive? No - Patient declined - - No - patient declined information - No - patient declined information No - patient declined information    Current Medications (verified) Outpatient Encounter Medications as of 09/30/2020  Medication Sig  . acetaminophen (TYLENOL) 160 MG/5ML solution Take 320 mg by mouth 2 (two) times daily as needed for mild pain. Reported on 07/21/2015  . cholecalciferol (VITAMIN D) 1000 units tablet Take 1,000 Units by mouth daily.  . citalopram (CELEXA) 20 MG tablet Take 1 tablet (20 mg total) by mouth daily.  . cyanocobalamin (,VITAMIN B-12,) 1000 MCG/ML injection Inject into the muscle.  . cyclobenzaprine (FLEXERIL) 10 MG tablet Take 10 mg by mouth 2 (two) times daily as needed for muscle spasms.   . diphenhydrAMINE (BENADRYL) 12.5 MG/5ML elixir Take 25 mg by mouth 4 (four) times daily as needed for allergies.  Marland Kitchen ergocalciferol (VITAMIN D2) 1.25 MG (50000 UT) capsule ergocalciferol  (vitamin D2) 1,250 mcg (50,000 unit) capsule  . famotidine (PEPCID) 40 MG/5ML suspension Take 5 mLs (40 mg total) by mouth 2 (two) times daily.  . ferrous sulfate 220 (44 Fe) MG/5ML solution Take by mouth.  . fluticasone (FLONASE) 50 MCG/ACT nasal spray Place into the nose.  . furosemide (LASIX) 40 MG tablet Take 1 tablet (40 mg total) by mouth daily as needed for fluid.  Marland Kitchen gabapentin (NEURONTIN) 300 MG capsule Take 300 mg by mouth 3 (three) times daily.  Marland Kitchen HYDROcodone-homatropine (HYCODAN) 5-1.5 MG/5ML syrup Take 5 mLs by mouth every 4 (four) hours as needed for cough.  . hydrOXYzine (ATARAX/VISTARIL) 25 MG tablet TAKE 1 TABLET (25 MG TOTAL) BY MOUTH 3 (THREE) TIMES DAILY AS NEEDED FOR ITCHING.  Marland Kitchen ibuprofen (ADVIL,MOTRIN) 200 MG tablet Take 200 mg by mouth every 8 (eight) hours as needed.  . levETIRAcetam (KEPPRA) 100 MG/ML solution Take 500 mg by mouth 2 (two) times daily.  Marland Kitchen levocetirizine (XYZAL) 5 MG tablet Take by mouth.  . neomycin-polymyxin-hydrocortisone (CORTISPORIN) 3.5-10000-1 ophthalmic suspension Place 2 drops into both eyes every 4 (four) hours.  . Oxcarbazepine (TRILEPTAL) 300 MG tablet Take 300 mg by mouth 2 (two) times daily.   . pantoprazole (PROTONIX) 40 MG tablet Take 1 tablet (40 mg total) by mouth 2 (two) times daily before a meal.  . propranolol (INDERAL) 20 MG tablet TAKE 1 OR 2 TABLETS THE MORNINGS OF MEDICAL OR DENTAL PROCEDURES  . sucralfate (CARAFATE) 1 GM/10ML suspension Take 10 mLs (1 g total) by mouth 3 (three) times daily.  Marland Kitchen  tizanidine (ZANAFLEX) 2 MG capsule Take 2 mg by mouth 2 (two) times daily.   No facility-administered encounter medications on file as of 09/30/2020.    Allergies (verified) Banana, Sulfa antibiotics, Sulfacetamide sodium, and Penicillins   History: Past Medical History:  Diagnosis Date  . Anemia   . GERD 12/30/2006  . Headache(784.0)   . Hypertension   . Multiple sclerosis ([place])   . PARESTHESIA 12/30/2006  . Seizures ([place])   .  Stroke [facility])    Past Surgical History:  Procedure Laterality Date  . ANAL INTRAEPITHELIAL NEOPLASIA EXCISION    . BREAST BIOPSY    . CESAREAN SECTION    . EYE SURGERY Right   . Myometomy    . TONSILLECTOMY     Family History  Problem Relation Age of Onset  . Dementia Mother   . Hypertension Mother   . Diabetes Mother   . Stroke Father   . Hypertension Father   . Psychosis Sister   . Hypertension Other   . Diabetes Other   . Dementia Other   . Dementia Maternal Grandmother   . Stroke Maternal Grandmother    Social History   Socioeconomic History  . Marital status: Married    Spouse name: [name]  . Number of children: 0  . Years of education: College  . Highest education level: Not on file  Occupational History    Employer: [employer]  . Occupation: RESEARCH ANALYST    Employer: [employer]  Tobacco Use  . Smoking status: Never Smoker  . Smokeless tobacco: Never Used  Vaping Use  . Vaping Use: Never used  Substance and Sexual Activity  . Alcohol use: No  . Drug use: No  . Sexual activity: Not on file  Other Topics Concern  . Not on file  Social History Narrative   Pt lives at home family.   Caffeine Use: 2 sodas daily   Social Determinants of Health   Financial Resource Strain: Not on file  Food Insecurity: Not on file  Transportation Needs: Not on file  Physical Activity: Not on file  Stress: Not on file  Social Connections: Not on file    Tobacco Counseling Counseling given: Not Answered   Clinical Intake:                 Diabetic?no         Activities of Daily Living No flowsheet data found.  Patient Care Team: Laurey Morale, MD as PCP - General (Family Medicine) [name], MD as Referring Physician (Neurology)  Indicate any recent Medical Services you may have received from other than [facility] providers in the past year (date may be approximate).     Assessment:   This is a routine wellness  examination for [name].  Hearing/Vision screen No exam data present  Dietary issues and exercise activities discussed:    Goals Addressed   None    Depression Screen PHQ 2/9 Scores 03/19/2020  PHQ - 2 Score 0    Fall Risk Fall Risk  03/19/2020  Falls in the past year? 1  Number falls in past yr: 1  Injury with Fall? 0  Follow up Falls evaluation completed    [place]:  Any stairs in or around the home? Yes  If so, are there any without handrails? Yes  Home free of loose throw rugs in walkways, pet beds, electrical cords, etc? {YES/NO:21197} Adequate lighting in your home to reduce risk  of falls? {YES/NO:21197}  ASSISTIVE DEVICES UTILIZED TO PREVENT FALLS:  Life alert? No  Use of a cane, walker or w/c? {YES/NO:21197} Grab bars in the bathroom? {YES/NO:21197} Shower chair or bench in shower? {YES/NO:21197} Elevated toilet seat or a handicapped toilet? {YES/NO:21197}   Cognitive Function:   Normal cognitive status assessed by direct observation by this Nurse Health Advisor. No abnormalities found.        Immunizations Immunization History  Administered Date(s) Administered  . PPD Test 07/09/2014  . Td 05/18/2000  . Tdap 03/29/2015    TDAP status: Up to date  Flu Vaccine status: Up to date  Pneumococcal vaccine status: Due, Education has been provided regarding the importance of this vaccine. Advised may receive this vaccine at local pharmacy or Health Dept. Aware to provide a copy of the vaccination record if obtained from local pharmacy or Health Dept. Verbalized acceptance and understanding.  Covid-19 vaccine status: Information provided on how to obtain vaccines.   Qualifies for Shingles Vaccine? No   Zostavax completed No   Shingrix Completed?: No.    Education has been provided regarding the importance of this vaccine. Patient has been advised to call insurance company to determine out of pocket expense if they have  not yet received this vaccine. Advised may also receive vaccine at local pharmacy or Health Dept. Verbalized acceptance and understanding.  Screening Tests Health Maintenance  Topic Date Due  . COVID-19 Vaccine (1) Never done  . Hepatitis C Screening  Never done  . COLONOSCOPY (Pts 45-69yrs Insurance coverage will need to be confirmed)  Never done  . PAP SMEAR-Modifier  05/01/2019  . INFLUENZA VACCINE  12/16/2020  . MAMMOGRAM  08/07/2021  . TETANUS/TDAP  03/28/2025  . HIV Screening  Completed  . HPV VACCINES  Aged Out    Health Maintenance  Health Maintenance Due  Topic Date Due  . COVID-19 Vaccine (1) Never done  . Hepatitis C Screening  Never done  . COLONOSCOPY (Pts 45-110yrs Insurance coverage will need to be confirmed)  Never done  . PAP SMEAR-Modifier  05/01/2019    Colorectal cancer screening: Referral to GI placed 0516/2022. Pt aware the office will call re: appt.  Mammogram status: Ordered 09/30/2020. Pt provided with contact info and advised to call to schedule appt.   Bone Density status: Ordered 09/30/2020. Pt provided with contact info and advised to call to schedule appt.  Lung Cancer Screening: (Low Dose CT Chest recommended if Age 30-80 years, 30 pack-year currently smoking OR have quit w/in 15years.) {DOES NOT does:27190::"does not"} qualify.   Lung Cancer Screening Referral: ***  Additional Screening:  Hepatitis C Screening: {DOES NOT does:27190::"does not"} qualify; Completed ***  Vision Screening: Recommended annual ophthalmology exams for early detection of glaucoma and other disorders of the eye. Is the patient up to date with their annual eye exam?  {YES/NO:21197} Who is the provider or what is the name of the office in which the patient attends annual eye exams? *** If pt is not established with a provider, would they like to be referred to a provider to establish care? {YES/NO:21197}.   Dental Screening: Recommended annual dental exams for proper  oral hygiene  Community Resource Referral / Chronic Care Management: CRR required this visit?  {YES/NO:21197}  CCM required this visit?  {YES/NO:21197}     Plan:     I have personally reviewed and noted the following in the patient's chart:   . Medical and social history . Use of alcohol, tobacco or  illicit drugs  . Current medications and supplements including opioid prescriptions. {Opioid Prescriptions:[phone]} . Functional ability and status . Nutritional status . Physical activity . Advanced directives . List of other physicians . Hospitalizations, surgeries, and ER visits in previous 12 months . Vitals . Screenings to include cognitive, depression, and falls . Referrals and appointments  In addition, I have reviewed and discussed with patient certain preventive protocols, quality metrics, and best practice recommendations. A written personalized care plan for preventive services as well as general preventive health recommendations were provided to patient.     Randel Pigg, LPN   [id number]/[id number]   Nurse Notes: ***

## 2020-09-30 NOTE — Telephone Encounter (Signed)
Pt was scheduled for Virtual visit 245pm today , sent a link to connect waited online x 15 minutes , no log on from patient, while waiting on line  attempted to call pt  @ 247pm again at 253pm and a third attempt at 300pm no answer and nvm.   If patient returns call please  reschedule AMW first available appointment.   Mickel Baas Jax Kentner,LPN

## 2020-10-01 NOTE — Telephone Encounter (Signed)
Noted  

## 2020-10-23 ENCOUNTER — Ambulatory Visit: Payer: PPO

## 2020-10-23 NOTE — Progress Notes (Deleted)
Subjective:   [name] is a 55 y.o. female who presents for an Initial Medicare Annual Wellness Visit.  Review of Systems    ***       Objective:    There were no vitals filed for this visit. There is no height or weight on file to calculate BMI.  Advanced Directives 06/02/2018 11/29/2016 10/04/2016 07/21/2015 11/29/2014 06/27/2014 06/27/2014  Does Patient Have a Medical Advance Directive? No No No No No No No  Would patient like information on creating a medical advance directive? No - Patient declined - - No - patient declined information - No - patient declined information No - patient declined information    Current Medications (verified) Outpatient Encounter Medications as of 10/23/2020  Medication Sig  . acetaminophen (TYLENOL) 160 MG/5ML solution Take 320 mg by mouth 2 (two) times daily as needed for mild pain. Reported on 07/21/2015  . cholecalciferol (VITAMIN D) 1000 units tablet Take 1,000 Units by mouth daily.  . citalopram (CELEXA) 20 MG tablet Take 1 tablet (20 mg total) by mouth daily.  . cyanocobalamin (,VITAMIN B-12,) 1000 MCG/ML injection Inject into the muscle.  . cyclobenzaprine (FLEXERIL) 10 MG tablet Take 10 mg by mouth 2 (two) times daily as needed for muscle spasms.   . diphenhydrAMINE (BENADRYL) 12.5 MG/5ML elixir Take 25 mg by mouth 4 (four) times daily as needed for allergies.  Marland Kitchen ergocalciferol (VITAMIN D2) 1.25 MG (50000 UT) capsule ergocalciferol (vitamin D2) 1,250 mcg (50,000 unit) capsule  . famotidine (PEPCID) 40 MG/5ML suspension Take 5 mLs (40 mg total) by mouth 2 (two) times daily.  . ferrous sulfate 220 (44 Fe) MG/5ML solution Take by mouth.  . fluticasone (FLONASE) 50 MCG/ACT nasal spray Place into the nose.  . furosemide (LASIX) 40 MG tablet Take 1 tablet (40 mg total) by mouth daily as needed for fluid.  Marland Kitchen gabapentin (NEURONTIN) 300 MG capsule Take 300 mg by mouth 3 (three) times daily.  Marland Kitchen HYDROcodone-homatropine (HYCODAN) 5-1.5 MG/5ML syrup  Take 5 mLs by mouth every 4 (four) hours as needed for cough.  . hydrOXYzine (ATARAX/VISTARIL) 25 MG tablet TAKE 1 TABLET (25 MG TOTAL) BY MOUTH 3 (THREE) TIMES DAILY AS NEEDED FOR ITCHING.  Marland Kitchen ibuprofen (ADVIL,MOTRIN) 200 MG tablet Take 200 mg by mouth every 8 (eight) hours as needed.  . levETIRAcetam (KEPPRA) 100 MG/ML solution Take 500 mg by mouth 2 (two) times daily.  Marland Kitchen levocetirizine (XYZAL) 5 MG tablet Take by mouth.  . neomycin-polymyxin-hydrocortisone (CORTISPORIN) 3.5-10000-1 ophthalmic suspension Place 2 drops into both eyes every 4 (four) hours.  . Oxcarbazepine (TRILEPTAL) 300 MG tablet Take 300 mg by mouth 2 (two) times daily.   . pantoprazole (PROTONIX) 40 MG tablet Take 1 tablet (40 mg total) by mouth 2 (two) times daily before a meal.  . propranolol (INDERAL) 20 MG tablet TAKE 1 OR 2 TABLETS THE MORNINGS OF MEDICAL OR DENTAL PROCEDURES  . sucralfate (CARAFATE) 1 GM/10ML suspension Take 10 mLs (1 g total) by mouth 3 (three) times daily.  . tizanidine (ZANAFLEX) 2 MG capsule Take 2 mg by mouth 2 (two) times daily.   No facility-administered encounter medications on file as of 10/23/2020.    Allergies (verified) Banana, Sulfa antibiotics, Sulfacetamide sodium, and Penicillins   History: Past Medical History:  Diagnosis Date  . Anemia   . GERD 12/30/2006  . Headache(784.0)   . Hypertension   . Multiple sclerosis ([place])   . PARESTHESIA 12/30/2006  . Seizures ([place])   . Stroke (  [facility])    Past Surgical History:  Procedure Laterality Date  . ANAL INTRAEPITHELIAL NEOPLASIA EXCISION    . BREAST BIOPSY    . CESAREAN SECTION    . EYE SURGERY Right   . Myometomy    . TONSILLECTOMY     Family History  Problem Relation Age of Onset  . Dementia Mother   . Hypertension Mother   . Diabetes Mother   . Stroke Father   . Hypertension Father   . Psychosis Sister   . Hypertension Other   . Diabetes Other   . Dementia Other   . Dementia Maternal Grandmother   . Stroke Maternal  Grandmother    Social History   Socioeconomic History  . Marital status: Married    Spouse name: [name]  . Number of children: 0  . Years of education: College  . Highest education level: Not on file  Occupational History    Employer: [employer]  . Occupation: RESEARCH ANALYST    Employer: [employer]  Tobacco Use  . Smoking status: Never Smoker  . Smokeless tobacco: Never Used  Vaping Use  . Vaping Use: Never used  Substance and Sexual Activity  . Alcohol use: No  . Drug use: No  . Sexual activity: Not on file  Other Topics Concern  . Not on file  Social History Narrative   Pt lives at home family.   Caffeine Use: 2 sodas daily   Social Determinants of Health   Financial Resource Strain: Not on file  Food Insecurity: Not on file  Transportation Needs: Not on file  Physical Activity: Not on file  Stress: Not on file  Social Connections: Not on file    Tobacco Counseling Counseling given: Not Answered   Clinical Intake:                 Diabetic?***         Activities of Daily Living No flowsheet data found.  Patient Care Team: Laurey Morale, MD as PCP - General (Family Medicine) [name], MD as Referring Physician (Neurology)  Indicate any recent Medical Services you may have received from other than [facility] providers in the past year (date may be approximate).     Assessment:   This is a routine wellness examination for [name].  Hearing/Vision screen No exam data present  Dietary issues and exercise activities discussed:    Goals Addressed   None    Depression Screen PHQ 2/9 Scores 03/19/2020  PHQ - 2 Score 0    Fall Risk Fall Risk  03/19/2020  Falls in the past year? 1  Number falls in past yr: 1  Injury with Fall? 0  Follow up Falls evaluation completed    [place]:  Any stairs in or around the home? {YES/NO:21197} If so, are there any without handrails?  {YES/NO:21197} Home free of loose throw rugs in walkways, pet beds, electrical cords, etc? {YES/NO:21197} Adequate lighting in your home to reduce risk of falls? {YES/NO:21197}  ASSISTIVE DEVICES UTILIZED TO PREVENT FALLS:  Life alert? {YES/NO:21197} Use of a cane, walker or w/c? {YES/NO:21197} Grab bars in the bathroom? {YES/NO:21197} Shower chair or bench in shower? {YES/NO:21197} Elevated toilet seat or a handicapped toilet? {YES/NO:21197}  TIMED UP AND GO:  Was the test performed? {YES/NO:21197}.  Length of time to ambulate 10 feet: *** sec.   {Appearance of OZHY:8657846}  Cognitive Function:        Immunizations Immunization History  Administered Date(s) Administered  . PPD Test 07/09/2014  . Td 05/18/2000  . Tdap 03/29/2015    {TDAP status:2101805}  {Flu Vaccine status:2101806}  {Pneumococcal vaccine status:2101807}  {Covid-19 vaccine status:2101808}  Qualifies for Shingles Vaccine? {YES/NO:21197}  Zostavax completed {YES/NO:21197}  {Shingrix Completed?:2101804}  Screening Tests Health Maintenance  Topic Date Due  . COVID-19 Vaccine (1) Never done  . Pneumococcal Vaccine 62-53 Years old (1 of 4 - PCV13) Never done  . Hepatitis C Screening  Never done  . COLONOSCOPY (Pts 45-80yrs Insurance coverage will need to be confirmed)  Never done  . Zoster Vaccines- Shingrix (1 of 2) Never done  . PAP SMEAR-Modifier  05/01/2019  . MAMMOGRAM  08/07/2020  . INFLUENZA VACCINE  12/16/2020  . TETANUS/TDAP  03/28/2025  . HIV Screening  Completed  . HPV VACCINES  Aged Out    Health Maintenance  Health Maintenance Due  Topic Date Due  . COVID-19 Vaccine (1) Never done  . Pneumococcal Vaccine 16-39 Years old (1 of 4 - PCV13) Never done  . Hepatitis C Screening  Never done  . COLONOSCOPY (Pts 45-66yrs Insurance coverage will need to be confirmed)  Never done  . Zoster Vaccines- Shingrix (1 of 2) Never done  . PAP SMEAR-Modifier  05/01/2019  . MAMMOGRAM  08/07/2020     {Colorectal cancer screening:2101809}  {Mammogram status:21018020}  {Bone Density status:21018021}  Lung Cancer Screening: (Low Dose CT Chest recommended if Age 29-80 years, 30 pack-year currently smoking OR have quit w/in 15years.) {DOES NOT does:27190::"does not"} qualify.   Lung Cancer Screening Referral: ***  Additional Screening:  Hepatitis C Screening: {DOES NOT does:27190::"does not"} qualify; Completed ***  Vision Screening: Recommended annual ophthalmology exams for early detection of glaucoma and other disorders of the eye. Is the patient up to date with their annual eye exam?  {YES/NO:21197} Who is the provider or what is the name of the office in which the patient attends annual eye exams? *** If pt is not established with a provider, would they like to be referred to a provider to establish care? {YES/NO:21197}.   Dental Screening: Recommended annual dental exams for proper oral hygiene  Community Resource Referral / Chronic Care Management: CRR required this visit?  {YES/NO:21197}  CCM required this visit?  {YES/NO:21197}     Plan:     I have personally reviewed and noted the following in the patient's chart:   . Medical and social history . Use of alcohol, tobacco or illicit drugs  . Current medications and supplements including opioid prescriptions. {Opioid Prescriptions:7658660844} . Functional ability and status . Nutritional status . Physical activity . Advanced directives . List of other physicians . Hospitalizations, surgeries, and ER visits in previous 12 months . Vitals . Screenings to include cognitive, depression, and falls . Referrals and appointments  In addition, I have reviewed and discussed with patient certain preventive protocols, quality metrics, and best practice recommendations. A written personalized care plan for preventive services as well as general preventive health recommendations were provided to patient.     Randel Pigg, LPN   07/17/5174   Nurse Notes: ***

## 2020-11-12 ENCOUNTER — Telehealth: Payer: Self-pay | Admitting: Family Medicine

## 2020-11-12 ENCOUNTER — Telehealth: Payer: Self-pay | Admitting: *Deleted

## 2020-11-12 ENCOUNTER — Ambulatory Visit (INDEPENDENT_AMBULATORY_CARE_PROVIDER_SITE_OTHER): Payer: PPO

## 2020-11-12 DIAGNOSIS — Z1231 Encounter for screening mammogram for malignant neoplasm of breast: Secondary | ICD-10-CM

## 2020-11-12 DIAGNOSIS — Z0000 Encounter for general adult medical examination without abnormal findings: Secondary | ICD-10-CM

## 2020-11-12 DIAGNOSIS — Z5941 Food insecurity: Secondary | ICD-10-CM

## 2020-11-12 NOTE — Chronic Care Management (AMB) (Signed)
  Chronic Care Management   Note  11/12/2020 Name: [name] MRN: [id number] DOB: August 08, 1965  [name] is a 55 y.o. year old female who is a primary care patient of Laurey Morale, MD. I reached out to [name] by phone today in response to a referral sent by Ms. Laurell Roof Harl's PCP, Dr. Sarajane Jews.      [name] was given information about Chronic Care Management services today including:  CCM service includes personalized support from designated clinical staff supervised by her physician, including individualized plan of care and coordination with other care providers 24/7 contact phone numbers for assistance for urgent and routine care needs. Service will only be billed when office clinical staff spend 20 minutes or more in a month to coordinate care. Only one practitioner may furnish and bill the service in a calendar month. The patient may stop CCM services at any time (effective at the end of the month) by phone call to the office staff. The patient will be responsible for cost sharing (co-pay) of up to 20% of the service fee (after annual deductible is met).  Patient agreed to services and verbal consent obtained.   Follow up plan: Telephone appointment with care management team member scheduled for:11/22/2020  Lometa Riggin  Care Guide, Embedded Care Coordination [place]  Care Management

## 2020-11-12 NOTE — Chronic Care Management (AMB) (Signed)
  Chronic Care Management   Note  11/12/2020 Name: [name] MRN: [id number] DOB: [birth year]  [name] is a 55 y.o. year old female who is a primary care patient of Laurey Morale, MD. I reached out to [name] by phone today in response to a referral sent by [name]'s PCP, Laurey Morale, MD.   [name] was given information about Chronic Care Management services today including:  CCM service includes personalized support from designated clinical staff supervised by her physician, including individualized plan of care and coordination with other care providers 24/7 contact phone numbers for assistance for urgent and routine care needs. Service will only be billed when office clinical staff spend 20 minutes or more in a month to coordinate care. Only one practitioner may furnish and bill the service in a calendar month. The patient may stop CCM services at any time (effective at the end of the month) by phone call to the office staff.   Patient agreed to services and verbal consent obtained.   Follow up plan:  Tatjana Dellinger Upstream Scheduler   Pt declines recording the call for training or quality purpose

## 2020-11-12 NOTE — Patient Instructions (Signed)
[name] , Thank you for taking time to come for your Medicare Wellness Visit. I appreciate your ongoing commitment to your health goals. Please review the following plan we discussed and let me know if I can assist you in the future.   Screening recommendations/referrals: Colonoscopy: currently scheduled 11/28/2020 Mammogram: referral completed 11/12/2020 Bone Density: age does not qualify  Recommended yearly ophthalmology/optometry visit for glaucoma screening and checkup Recommended yearly dental visit for hygiene and checkup  Vaccinations: Influenza vaccine: due in fall 2022 Pneumococcal vaccine: current due  Tdap vaccine: current 03/29/2015 due 2026 Shingles vaccine: does nor qualify   Advanced directives: will pick up packet   Conditions/risks identified: Pt is having trouble paying for housing and has a foreclosure pending, she is very stressed paying for MS medications  She says she often has to choose to eat or take her medicine. She stated currently they have no food in the house because her husband had surgery and money is very tight   Next appointment: none   Preventive Care 40-64 Years, Female Preventive care refers to lifestyle choices and visits with your health care provider that can promote health and wellness. What does preventive care include? A yearly physical exam. This is also called an annual well check. Dental exams once or twice a year. Routine eye exams. Ask your health care provider how often you should have your eyes checked. Personal lifestyle choices, including: Daily care of your teeth and gums. Regular physical activity. Eating a healthy diet. Avoiding tobacco and drug use. Limiting alcohol use. Practicing safe sex. Taking low-dose aspirin daily starting at age 28. Taking vitamin and mineral supplements as recommended by your health care provider. What happens during an annual well check? The services and screenings done by your health  care provider during your annual well check will depend on your age, overall health, lifestyle risk factors, and family history of disease. Counseling  Your health care provider may ask you questions about your: Alcohol use. Tobacco use. Drug use. Emotional well-being. Home and relationship well-being. Sexual activity. Eating habits. Work and work Statistician. Method of birth control. Menstrual cycle. Pregnancy history. Screening  You may have the following tests or measurements: Height, weight, and BMI. Blood pressure. Lipid and cholesterol levels. These may be checked every 5 years, or more frequently if you are over 62 years old. Skin check. Lung cancer screening. You may have this screening every year starting at age 67 if you have a 30-pack-year history of smoking and currently smoke or have quit within the past 15 years. Fecal occult blood test (FOBT) of the stool. You may have this test every year starting at age 18. Flexible sigmoidoscopy or colonoscopy. You may have a sigmoidoscopy every 5 years or a colonoscopy every 10 years starting at age 7. Hepatitis C blood test. Hepatitis B blood test. Sexually transmitted disease (STD) testing. Diabetes screening. This is done by checking your blood sugar (glucose) after you have not eaten for a while (fasting). You may have this done every 1-3 years. Mammogram. This may be done every 1-2 years. Talk to your health care provider about when you should start having regular mammograms. This may depend on whether you have a family history of breast cancer. BRCA-related cancer screening. This may be done if you have a family history of breast, ovarian, tubal, or peritoneal cancers. Pelvic exam and Pap test. This may be done every 3 years starting at age 44. Starting at age 39, this may be done  every 5 years if you have a Pap test in combination with an HPV test. Bone density scan. This is done to screen for osteoporosis. You may have this  scan if you are at high risk for osteoporosis. Discuss your test results, treatment options, and if necessary, the need for more tests with your health care provider. Vaccines  Your health care provider may recommend certain vaccines, such as: Influenza vaccine. This is recommended every year. Tetanus, diphtheria, and acellular pertussis (Tdap, Td) vaccine. You may need a Td booster every 10 years. Zoster vaccine. You may need this after age 88. Pneumococcal 13-valent conjugate (PCV13) vaccine. You may need this if you have certain conditions and were not previously vaccinated. Pneumococcal polysaccharide (PPSV23) vaccine. You may need one or two doses if you smoke cigarettes or if you have certain conditions. Talk to your health care provider about which screenings and vaccines you need and how often you need them. This information is not intended to replace advice given to you by your health care provider. Make sure you discuss any questions you have with your health care provider. Document Released: 05/31/2015 Document Revised: 01/22/2016 Document Reviewed: 03/05/2015 Elsevier Interactive Patient Education  2017 [employer] Prevention in the Home Falls can cause injuries. They can happen to people of all ages. There are many things you can do to make your home safe and to help prevent falls. What can I do on the outside of my home? Regularly fix the edges of walkways and driveways and fix any cracks. Remove anything that might make you trip as you walk through a door, such as a raised step or threshold. Trim any bushes or trees on the path to your home. Use bright outdoor lighting. Clear any walking paths of anything that might make someone trip, such as rocks or tools. Regularly check to see if handrails are loose or broken. Make sure that both sides of any steps have handrails. Any raised decks and porches should have guardrails on the edges. Have any leaves, snow, or ice  cleared regularly. Use sand or salt on walking paths during winter. Clean up any spills in your garage right away. This includes oil or grease spills. What can I do in the bathroom? Use night lights. Install grab bars by the toilet and in the tub and shower. Do not use towel bars as grab bars. Use non-skid mats or decals in the tub or shower. If you need to sit down in the shower, use a plastic, non-slip stool. Keep the floor dry. Clean up any water that spills on the floor as soon as it happens. Remove soap buildup in the tub or shower regularly. Attach bath mats securely with double-sided non-slip rug tape. Do not have throw rugs and other things on the floor that can make you trip. What can I do in the bedroom? Use night lights. Make sure that you have a light by your bed that is easy to reach. Do not use any sheets or blankets that are too big for your bed. They should not hang down onto the floor. Have a firm chair that has side arms. You can use this for support while you get dressed. Do not have throw rugs and other things on the floor that can make you trip. What can I do in the kitchen? Clean up any spills right away. Avoid walking on wet floors. Keep items that you use a lot in easy-to-reach places. If you need  to reach something above you, use a strong step stool that has a grab bar. Keep electrical cords out of the way. Do not use floor polish or wax that makes floors slippery. If you must use wax, use non-skid floor wax. Do not have throw rugs and other things on the floor that can make you trip. What can I do with my stairs? Do not leave any items on the stairs. Make sure that there are handrails on both sides of the stairs and use them. Fix handrails that are broken or loose. Make sure that handrails are as long as the stairways. Check any carpeting to make sure that it is firmly attached to the stairs. Fix any carpet that is loose or worn. Avoid having throw rugs at the  top or bottom of the stairs. If you do have throw rugs, attach them to the floor with carpet tape. Make sure that you have a light switch at the top of the stairs and the bottom of the stairs. If you do not have them, ask someone to add them for you. What else can I do to help prevent falls? Wear shoes that: Do not have high heels. Have rubber bottoms. Are comfortable and fit you well. Are closed at the toe. Do not wear sandals. If you use a stepladder: Make sure that it is fully opened. Do not climb a closed stepladder. Make sure that both sides of the stepladder are locked into place. Ask someone to hold it for you, if possible. Clearly mark and make sure that you can see: Any grab bars or handrails. First and last steps. Where the edge of each step is. Use tools that help you move around (mobility aids) if they are needed. These include: Canes. Walkers. Scooters. Crutches. Turn on the lights when you go into a dark area. Replace any light bulbs as soon as they burn out. Set up your furniture so you have a clear path. Avoid moving your furniture around. If any of your floors are uneven, fix them. If there are any pets around you, be aware of where they are. Review your medicines with your doctor. Some medicines can make you feel dizzy. This can increase your chance of falling. Ask your doctor what other things that you can do to help prevent falls. This information is not intended to replace advice given to you by your health care provider. Make sure you discuss any questions you have with your health care provider. Document Released: 02/28/2009 Document Revised: 10/10/2015 Document Reviewed: 06/08/2014 Elsevier Interactive Patient Education  2017 [employer].

## 2020-11-12 NOTE — Progress Notes (Signed)
Subjective:   [name] is a 55 y.o. female who presents for an Initial Medicare Annual Wellness Visit.  I connected with Yuliya Nova today by telephone and verified that I am speaking with the correct person using two identifiers. Location patient: home Location provider: work Persons participating in the virtual visit: patient, provider.   I discussed the limitations, risks, security and privacy concerns of performing an evaluation and management service by telephone and the availability of in person appointments. I also discussed with the patient that there may be a patient responsible charge related to this service. The patient expressed understanding and verbally consented to this telephonic visit.    Interactive audio and video telecommunications were attempted between this provider and patient, however failed, due to patient having technical difficulties OR patient did not have access to video capability.  We continued and completed visit with audio only.    Review of Systems    N/a       Objective:    There were no vitals filed for this visit. There is no height or weight on file to calculate BMI.  Advanced Directives 06/02/2018 11/29/2016 10/04/2016 07/21/2015 11/29/2014 06/27/2014 06/27/2014  Does Patient Have a Medical Advance Directive? No No No No No No No  Would patient like information on creating a medical advance directive? No - Patient declined - - No - patient declined information - No - patient declined information No - patient declined information    Current Medications (verified) Outpatient Encounter Medications as of 11/12/2020  Medication Sig   acetaminophen (TYLENOL) 160 MG/5ML solution Take 320 mg by mouth 2 (two) times daily as needed for mild pain. Reported on 07/21/2015   cholecalciferol (VITAMIN D) 1000 units tablet Take 1,000 Units by mouth daily.   citalopram (CELEXA) 20 MG tablet Take 1 tablet (20 mg total) by mouth daily.   cyanocobalamin  (,VITAMIN B-12,) 1000 MCG/ML injection Inject into the muscle.   cyclobenzaprine (FLEXERIL) 10 MG tablet Take 10 mg by mouth 2 (two) times daily as needed for muscle spasms.    diphenhydrAMINE (BENADRYL) 12.5 MG/5ML elixir Take 25 mg by mouth 4 (four) times daily as needed for allergies.   ergocalciferol (VITAMIN D2) 1.25 MG (50000 UT) capsule ergocalciferol (vitamin D2) 1,250 mcg (50,000 unit) capsule   famotidine (PEPCID) 40 MG/5ML suspension Take 5 mLs (40 mg total) by mouth 2 (two) times daily.   ferrous sulfate 220 (44 Fe) MG/5ML solution Take by mouth.   fluticasone (FLONASE) 50 MCG/ACT nasal spray Place into the nose.   furosemide (LASIX) 40 MG tablet Take 1 tablet (40 mg total) by mouth daily as needed for fluid.   gabapentin (NEURONTIN) 300 MG capsule Take 300 mg by mouth 3 (three) times daily.   HYDROcodone-homatropine (HYCODAN) 5-1.5 MG/5ML syrup Take 5 mLs by mouth every 4 (four) hours as needed for cough.   hydrOXYzine (ATARAX/VISTARIL) 25 MG tablet TAKE 1 TABLET (25 MG TOTAL) BY MOUTH 3 (THREE) TIMES DAILY AS NEEDED FOR ITCHING.   ibuprofen (ADVIL,MOTRIN) 200 MG tablet Take 200 mg by mouth every 8 (eight) hours as needed.   levETIRAcetam (KEPPRA) 100 MG/ML solution Take 500 mg by mouth 2 (two) times daily.   levocetirizine (XYZAL) 5 MG tablet Take by mouth.   neomycin-polymyxin-hydrocortisone (CORTISPORIN) 3.5-10000-1 ophthalmic suspension Place 2 drops into both eyes every 4 (four) hours.   Oxcarbazepine (TRILEPTAL) 300 MG tablet Take 300 mg by mouth 2 (two) times daily.    pantoprazole (PROTONIX) 40 MG tablet  Take 1 tablet (40 mg total) by mouth 2 (two) times daily before a meal.   propranolol (INDERAL) 20 MG tablet TAKE 1 OR 2 TABLETS THE MORNINGS OF MEDICAL OR DENTAL PROCEDURES   sucralfate (CARAFATE) 1 GM/10ML suspension Take 10 mLs (1 g total) by mouth 3 (three) times daily.   tizanidine (ZANAFLEX) 2 MG capsule Take 2 mg by mouth 2 (two) times daily.   No  facility-administered encounter medications on file as of 11/12/2020.    Allergies (verified) Banana, Sulfa antibiotics, Sulfacetamide sodium, and Penicillins   History: Past Medical History:  Diagnosis Date   Anemia    GERD 12/30/2006   Headache(784.0)    Hypertension    Multiple sclerosis ([place])    PARESTHESIA 12/30/2006   Seizures ([place])    Stroke [facility])    Past Surgical History:  Procedure Laterality Date   ANAL INTRAEPITHELIAL NEOPLASIA EXCISION     BREAST BIOPSY     CESAREAN SECTION     EYE SURGERY Right    Myometomy     TONSILLECTOMY     Family History  Problem Relation Age of Onset   Dementia Mother    Hypertension Mother    Diabetes Mother    Stroke Father    Hypertension Father    Psychosis Sister    Hypertension Other    Diabetes Other    Dementia Other    Dementia Maternal Grandmother    Stroke Maternal Grandmother    Social History   Socioeconomic History   Marital status: Married    Spouse name: [name]   Number of children: 0   Years of education: [employer] education level: Not on file  Occupational History    Employer: [employer]   Occupation: RESEARCH ANALYST    Employer: [employer]  Tobacco Use   Smoking status: Never   Smokeless tobacco: Never  Vaping Use   Vaping Use: Never used  Substance and Sexual Activity   Alcohol use: No   Drug use: No   Sexual activity: Not on file  Other Topics Concern   Not on file  Social History Narrative   Pt lives at home family.   Caffeine Use: 2 sodas daily   Social Determinants of Health   Financial Resource Strain: Not on file  Food Insecurity: Not on file  Transportation Needs: Not on file  Physical Activity: Not on file  Stress: Not on file  Social Connections: Not on file    Tobacco Counseling Counseling given: Not Answered   Clinical Intake:                 Diabetic?no         Activities of Daily Living No flowsheet data found.  Patient Care  Team: Laurey Morale, MD as PCP - General (Family Medicine) [name], MD as Referring Physician (Neurology)  Indicate any recent Medical Services you may have received from other than [facility] providers in the past year (date may be approximate).     Assessment:   This is a routine wellness examination for [name].  Hearing/Vision screen No results found.  Dietary issues and exercise activities discussed:     Goals Addressed   None    Depression Screen PHQ 2/9 Scores 03/19/2020  PHQ - 2 Score 0    Fall Risk Fall Risk  03/19/2020  Falls in the past year? 1  Number falls in past yr: 1  Injury with Fall? 0  Follow up Falls  evaluation completed    FALL RISK PREVENTION PERTAINING TO THE HOME:  Any stairs in or around the home? Yes  If so, are there any without handrails? Yes  Home free of loose throw rugs in walkways, pet beds, electrical cords, etc? Yes  Adequate lighting in your home to reduce risk of falls? Yes   ASSISTIVE DEVICES UTILIZED TO PREVENT FALLS:  Life alert? No  Use of a cane, walker or w/c? Yes  Grab bars in the bathroom? Yes  Shower chair or bench in shower? Yes  Elevated toilet seat or a handicapped toilet? No    Cognitive Function:    Normal cognitive status assessed by direct observation by this Nurse Health Advisor. No abnormalities found.      Immunizations Immunization History  Administered Date(s) Administered   PPD Test 07/09/2014   Td 05/18/2000   Tdap 03/29/2015    TDAP status: Up to date  Flu Vaccine status: Up to date  Pneumococcal vaccine status: Up to date  Covid-19 vaccine status: Completed vaccines  Qualifies for Shingles Vaccine? Yes   Zostavax completed No   Shingrix Completed?: No.    Education has been provided regarding the importance of this vaccine. Patient has been advised to call insurance company to determine out of pocket expense if they have not yet received this vaccine. Advised may also receive  vaccine at local pharmacy or Health Dept. Verbalized acceptance and understanding.  Screening Tests Health Maintenance  Topic Date Due   COVID-19 Vaccine (1) Never done   Pneumococcal Vaccine 57-23 Years old (1 - PCV) Never done   Hepatitis C Screening  Never done   Zoster Vaccines- Shingrix (1 of 2) Never done   COLONOSCOPY (Pts 45-36yrs Insurance coverage will need to be confirmed)  Never done   PAP SMEAR-Modifier  05/01/2019   MAMMOGRAM  08/07/2020   INFLUENZA VACCINE  12/16/2020   TETANUS/TDAP  03/28/2025   HIV Screening  Completed   HPV VACCINES  Aged Out    Health Maintenance  Health Maintenance Due  Topic Date Due   COVID-19 Vaccine (1) Never done   Pneumococcal Vaccine 72-81 Years old (1 - PCV) Never done   Hepatitis C Screening  Never done   Zoster Vaccines- Shingrix (1 of 2) Never done   COLONOSCOPY (Pts 45-79yrs Insurance coverage will need to be confirmed)  Never done   PAP SMEAR-Modifier  05/01/2019   MAMMOGRAM  08/07/2020    Colorectal cancer screening: Referral to GI placed 11/12/2020  pt is schedule 11/28/2020. Pt aware the office will call re: appt.  Mammogram status: Ordered 11/12/2020. Pt provided with contact info and advised to call to schedule appt.   Bone Density status: Completed does not qualify . Results reflect: Bone density results: NORMAL. Repeat every does  years.  Lung Cancer Screening: (Low Dose CT Chest recommended if Age 24-80 years, 30 pack-year currently smoking OR have quit w/in 15years.) does not qualify.   Lung Cancer Screening Referral: n/a  Additional Screening:  Hepatitis C Screening: does not qualify  Vision Screening: Recommended annual ophthalmology exams for early detection of glaucoma and other disorders of the eye. Is the patient up to date with their annual eye exam?  Yes  Who is the provider or what is the name of the office in which the patient attends annual eye exams? Dr.Oman If pt is not established with a provider,  would they like to be referred to a provider to establish care? No .   Dental  Screening: Recommended annual dental exams for proper oral hygiene  Community Resource Referral / Chronic Care Management: CRR required this visit?  No   CCM required this visit?  No      Plan:     I have personally reviewed and noted the following in the patient's chart:   Medical and social history Use of alcohol, tobacco or illicit drugs  Current medications and supplements including opioid prescriptions. Patient is not currently taking opioid prescriptions. Functional ability and status Nutritional status Physical activity Advanced directives List of other physicians Hospitalizations, surgeries, and ER visits in previous 12 months Vitals Screenings to include cognitive, depression, and falls Referrals and appointments  In addition, I have reviewed and discussed with patient certain preventive protocols, quality metrics, and best practice recommendations. A written personalized care plan for preventive services as well as general preventive health recommendations were provided to patient.     Randel Pigg, LPN   3/56/7014   Nurse Notes: Pt is having trouble paying for housing and has a foreclosure pending, she is very stressed paying for MS medications  She says she often has to choose to eat or take her medicine. She stated currently they have no food in the house because her husband had surgery and money is very tight

## 2020-11-13 ENCOUNTER — Telehealth: Payer: Self-pay | Admitting: *Deleted

## 2020-11-13 NOTE — Telephone Encounter (Signed)
   Telephone encounter was:  Successful.  11/13/2020 Name: [name] MRN: [id number] DOB: February 19, 1966  [name] is a 55 y.o. year old female who is a primary care patient of Laurey Morale, MD . The community resource team was consulted for assistance with Food Insecurity and [place] guide performed the following interventions: Patient provided with information about care guide support team and interviewed to confirm resource needs.  Follow Up Plan:  Care guide will follow up with patient by phone over the next day and No further follow up planned at this time. The patient has been provided with needed resources.  [place], Care Management  [phone] [address] , [place] [address] Email : [email or web address]

## 2020-11-14 ENCOUNTER — Ambulatory Visit: Payer: PPO

## 2020-11-21 ENCOUNTER — Telehealth: Payer: Self-pay | Admitting: Pharmacist

## 2020-11-21 NOTE — Chronic Care Management (AMB) (Signed)
Chronic Care Management Pharmacy Assistant   Name: [name]  MRN: [id number] DOB: 07-13-1965  Reason for Encounter: Chart Prep for CPP Visit 07.12.2022   Conditions to be addressed/monitored: HTN and GERD and Multiple Sclerosis  Primary concerns for visit include: Hypertension, MS and Medication cost  Recent office visits:  02.28.2022 Laurey Morale, MD patient was seen for hospital follow up from [facility]. She continued Pantoprazole Sodium 40 mg and Sucralfate 1 g as written from discharge. Medication changed Famotidine 40 mg Oral 2 times daily. Medication added Hydrocodone-Homatropine 5-1.5 MG/5ML 5 mLs Oral Every 4 hours as needed.  Recent consult visits:  05.10.2022 Mart Piggs, [facility] - Initial consults 03.17.2022 [name], MD ([facility]) patient seen doe follow-up for [facility] visits:  Medication Reconciliation was completed by comparing discharge summary, patient's EMR and Pharmacy list, and upon discussion with patient.  Admitted to the hospital on 04.17.2022 due to Weakness. Discharge date was 04.18.2022. Discharged from [place]?Medications Started at [facility] Discharge:?? -started the following medication Famotidine 40 mg/56ml   Medication Changes at Hospital Discharge: -Changed none  Medications Discontinued at Hospital Discharge: -Stopped none  Medications that remain the same after Hospital Discharge:??  -All other medications will remain the same.    Hospital visits:  Medication Reconciliation was completed by comparing discharge summary, patient's EMR and Pharmacy list, and upon discussion with patient.  Admitted to the hospital on 07/06/2020 due to Multiple Sclerosis. Discharge date was 07/06/2020. Discharged from [place]?Medications Started at [facility] Discharge:?? -started the following  medications Famotidine 40 mg/5 mg: take 2.5 mls by mouth 2 times daily Ondansetron 4 mg: take one tablet by mouth every 6 hours as needed for 7 days mag-alum hydroxide-simethicone   Medication Changes at Hospital Discharge: -Changed None  Medications Discontinued at Hospital Discharge: -Stopped None  Medications that remain the same after Hospital Discharge:??  -All other medications will remain the same.     Medications: Outpatient Encounter Medications as of 11/21/2020  Medication Sig   acetaminophen (TYLENOL) 160 MG/5ML solution Take 320 mg by mouth 2 (two) times daily as needed for mild pain. Reported on 07/21/2015   cholecalciferol (VITAMIN D) 1000 units tablet Take 1,000 Units by mouth daily.   citalopram (CELEXA) 20 MG tablet Take 1 tablet (20 mg total) by mouth daily.   cyanocobalamin (,VITAMIN B-12,) 1000 MCG/ML injection Inject into the muscle.   cyclobenzaprine (FLEXERIL) 10 MG tablet Take 10 mg by mouth 2 (two) times daily as needed for muscle spasms.    diphenhydrAMINE (BENADRYL) 12.5 MG/5ML elixir Take 25 mg by mouth 4 (four) times daily as needed for allergies.   ergocalciferol (VITAMIN D2) 1.25 MG (50000 UT) capsule ergocalciferol (vitamin D2) 1,250 mcg (50,000 unit) capsule   famotidine (PEPCID) 40 MG/5ML suspension Take 5 mLs (40 mg total) by mouth 2 (two) times daily.   ferrous sulfate 220 (44 Fe) MG/5ML solution Take by mouth.   fluticasone (FLONASE) 50 MCG/ACT nasal spray Place into the nose.   furosemide (LASIX) 40 MG tablet Take 1 tablet (40 mg total) by mouth daily as needed for fluid.   gabapentin (NEURONTIN) 300 MG capsule Take 300 mg by mouth 3 (three) times daily.   HYDROcodone-homatropine (HYCODAN) 5-1.5 MG/5ML syrup Take 5 mLs by mouth every 4 (four) hours as needed for cough. (Patient not taking: Reported on 11/12/2020)  hydrOXYzine (ATARAX/VISTARIL) 25 MG tablet TAKE 1 TABLET (25 MG TOTAL) BY MOUTH 3 (THREE) TIMES DAILY AS NEEDED FOR ITCHING.   ibuprofen  (ADVIL,MOTRIN) 200 MG tablet Take 200 mg by mouth every 8 (eight) hours as needed.   levETIRAcetam (KEPPRA) 100 MG/ML solution Take 500 mg by mouth 2 (two) times daily.   levocetirizine (XYZAL) 5 MG tablet Take by mouth.   neomycin-polymyxin-hydrocortisone (CORTISPORIN) 3.5-10000-1 ophthalmic suspension Place 2 drops into both eyes every 4 (four) hours. (Patient not taking: Reported on 11/12/2020)   Oxcarbazepine (TRILEPTAL) 300 MG tablet Take 300 mg by mouth 2 (two) times daily.    pantoprazole (PROTONIX) 40 MG tablet Take 1 tablet (40 mg total) by mouth 2 (two) times daily before a meal.   propranolol (INDERAL) 20 MG tablet TAKE 1 OR 2 TABLETS THE MORNINGS OF MEDICAL OR DENTAL PROCEDURES   sucralfate (CARAFATE) 1 GM/10ML suspension Take 10 mLs (1 g total) by mouth 3 (three) times daily.   tizanidine (ZANAFLEX) 2 MG capsule Take 2 mg by mouth 2 (two) times daily.   No facility-administered encounter medications on file as of 11/21/2020.   Patient Questions:   Have you seen any other providers since your last visit?  [facility] - seen for [facility] Any changes in your medications or health? Yes, change with gastro medication (price is too much) Any side effects from any medications? No Do you have an symptoms or problems not managed by your medications?  Acid reflex -not able to get medication currently  Any concerns about your health right now?  Multiple sclerosis OBYN issues Has your provider asked that you check blood pressure, blood sugar, or follow special diet at home? No Do you get any type of exercise on a regular basis?  No Can you think of a goal you would like to reach for your health?   Loss some weight Get medication under control and cost Do you have any problems getting your medications?  Would like to see if can get some patient assistance. Is there anything that you would like to discuss during the appointment? To see if there are any programs that can  help with, the cost of her medication. She states if she was able to afford all her medication, she knows that she will feel better.  Date- Patient called to remind of appointment with Watt Climes on 07.12.2022 at 3:30 pm  Patient aware of appointment date, time, and type of appointment (telephone) . Patient aware to have/bring all medications, supplements, blood pressure and/or blood sugar logs to visit.  Care Gaps: Covid-19 Vaccine Pneumococcal Vaccine Hepatitis C screening Zoster Vaccine Colonoscopy Pap Smear Mammogram   Star Rating Drugs: None   Amilia Revonda Standard, [place] Pharmacist Assistant [phone]

## 2020-11-22 ENCOUNTER — Ambulatory Visit (INDEPENDENT_AMBULATORY_CARE_PROVIDER_SITE_OTHER): Payer: PPO

## 2020-11-22 DIAGNOSIS — N179 Acute kidney failure, unspecified: Secondary | ICD-10-CM

## 2020-11-22 DIAGNOSIS — K2101 Gastro-esophageal reflux disease with esophagitis, with bleeding: Secondary | ICD-10-CM

## 2020-11-22 DIAGNOSIS — I10 Essential (primary) hypertension: Secondary | ICD-10-CM | POA: Diagnosis not present

## 2020-11-22 DIAGNOSIS — G35 Multiple sclerosis: Secondary | ICD-10-CM

## 2020-11-22 NOTE — Patient Instructions (Addendum)
Visit Information   PATIENT GOALS:   Goals Addressed             This Visit's Progress    RNCM:Keep Myself Safe/Fall prevention-Multiple Sclerosis   On track    Timeframe:  Long-Range Goal Priority:  High Start Date:       11/22/20                      Expected End Date:      04/17/21                 Follow Up Date 12/23/20    - add more outdoor lighting - always use handrails on the stairs - always wear shoes or slippers with non-slip sole - install bathroom grab bars - keep a flashlight by the bed - keep cell phone with me always - make an emergency alert plan in case I fall - pick up clutter from the floors - use a cane or walker - use a nightlight in the bathroom, halls - use a nonslip pad with throw rugs, or remove them completely - always wear low-heeled or flat shoes or slippers with nonskid soles    Why is this important?     Notes:       RNCM:Manage Fatigue (Tiredness-Multiple Sclerosis)   On track    Timeframe:  Long-Range Goal Priority:  Medium Start Date:    11/22/20                         Expected End Date:     04/17/21                  Follow Up Date 12/23/20    - eat healthy - get at least 8 hours of sleep at night - keep the bedroom cool and dark - maintain a healthy weight - stop using electronic devices like cell phone or video game when getting ready for sleep - use a fan or white noise in bedroom - use cooling vest - use devices that will help like a cane, sock-puller or reacher - use meditation or relaxation techniques    Why is this important?   MS (multiple sclerosis) can drain your loved one's/your energy. It can keep from doing things your loved one/you would like to do.  There are many things that your loved one/you can do to manage fatigue.    Notes:       RNCM:Track and Manage My Blood Pressure-Hypertension   On track    Timeframe:  Long-Range Goal Priority:  Medium Start Date:     11/22/20                        Expected End Date:    04/17/21                    Follow Up Date 12/23/20    - check blood pressure 3 times per week - choose a place to take my blood pressure (home, clinic or office, retail store) - write blood pressure results in a log or diary    Why is this important?   You won't feel high blood pressure, but it can still hurt your blood vessels.  High blood pressure can cause heart or kidney problems. It can also cause a stroke.  Making lifestyle changes like losing a little weight or eating less salt will help.  Checking your blood pressure at home and at different times of the day can help to control blood pressure.  If the doctor prescribes medicine remember to take it the way the doctor ordered.  Call the office if you cannot afford the medicine or if there are questions about it.     Notes:         It is important to avoid accidents which may result in broken bones.  Here are a few ideas on how to make your home safer so you will be less likely to trip or fall.  Use nonskid mats or non slip strips in your shower or tub, on your bathroom floor and around sinks.  If you know that you have spilled water, wipe it up! In the bathroom, it is important to have properly installed grab bars on the walls or on the edge of the tub.  Towel racks are NOT strong enough for you to hold onto or to pull on for support. Stairs and hallways should have enough light.  Add lamps or night lights if you need ore light. It is good to have handrails on both sides of the stairs if possible.  Always fix broken handrails right away. It is important to see the edges of steps.  Paint the edges of outdoor steps white so you can see them better.  Put colored tape on the edge of inside steps. Throw-rugs are dangerous because they can slide.  Removing the rugs is the best idea, but if they must stay, add adhesive carpet tape to prevent slipping. Do not keep things on stairs or in the halls.  Remove small furniture that blocks the  halls as it may cause you to trip.  Keep telephone and electrical cords out of the way where you walk. Always were sturdy, rubber-soled shoes for good support.  Never wear just socks, especially on the stairs.  Socks may cause you to slip or fall.  Do not wear full-length housecoats as you can easily trip on the bottom.  Place the things you use the most on the shelves that are the easiest to reach.  If you use a stepstool, make sure it is in good condition.  If you feel unsteady, DO NOT climb, ask for help. If a health professional advises you to use a cane or walker, do not be ashamed.  These items can keep you from falling and breaking your bones. Consent to CCM Services: [name] was given information about Chronic Care Management services today including:  CCM service includes personalized support from designated clinical staff supervised by her physician, including individualized plan of care and coordination with other care providers 24/7 contact phone numbers for assistance for urgent and routine care needs. Service will only be billed when office clinical staff spend 20 minutes or more in a month to coordinate care. Only one practitioner may furnish and bill the service in a calendar month. The patient may stop CCM services at any time (effective at the end of the month) by phone call to the office staff. The patient will be responsible for cost sharing (co-pay) of up to 20% of the service fee (after annual deductible is met).  Patient agreed to services and verbal consent obtained.   The patient verbalized understanding of instructions, educational materials, and care plan provided today and agreed to receive a mailed copy of patient instructions, educational materials, and care plan.   Telephone follow up appointment with care management team member scheduled  for: 12/23/20  Melissa Sandlin RN, BSN,CCM, CDE Care Management Coordinator [facility] [phone],  Mobile [phone]   CLINICAL CARE PLAN: Patient Care Plan: Multiple Sclerosis (Adult)     Problem Identified: Lack of self management of Multiple Sclerosis   Priority: High     Long-Range Goal: Effective self management of Multiple Sclerosis   Start Date: 11/22/2020  This Visit's Progress: On track  Priority: High  Note:   Current Barriers:  Ineffective Self Health Maintenance of Multiple Sclerosis with hx of seizures, HTN, GERD Unable to independently self manage MS Unable to self administer medications as prescribed due to cost Does not adhere to provider recommendations re:  Unable to perform IADLs independently: needs assist with shopping, transportation and sometimes with dressing depending if MS is flaring up Pt states that she was dx with MS in 2008.  States she goes to neurology at [facility] for treatment.  States her MS has progressed to the point where she need to have chemo.  States she has good days and bad days .  States that the hot weather has really been difficult for her this year. States she has difficulty swallowing liquids and her pills need to be crushed or in liquid form.  States her medications are too expensive and she can not afford some of them. STates she has been very stressed with dealing with her MS and financial difficulty since she and her husband are on disability  Clinical Goal(s):  Collaboration with Fry, Stephen A, MD regarding development and update of comprehensive plan of care as evidenced by provider attestation and co-signature Inter-disciplinary care team collaboration (see longitudinal plan of care) patient will work with care management team to address care coordination and chronic disease management needs related to Disease Management Educational Needs Care Coordination Medication Assistance  Psychosocial Support   Interventions:  Evaluation of current treatment plan related to HTN and MS, seizures, GERD , Financial constraints related to  medications,housing and food, Limited access to food, Medication procurement, and ADL IADL limitations self-management and patient's adherence to plan as established by provider. Collaboration with Fry, Stephen A, MD regarding development and update of comprehensive plan of care as evidenced by provider attestation       and co-signature Inter-disciplinary care team collaboration (see longitudinal plan of care) Discussed plans with patient for ongoing care management follow up and provided patient with direct contact information for care management team Reviewed fall prevention and home safety  Reviewed importance of pacing her activity and to avoid activity during the heat of the day Referred to LCSW for stress and difficulty coping with MS and financial strain Scheduled for visit with CCM pharmacist on 11/26/20 with concerns with cost of medications Care guide has been referred for food resources and housing repairs Self Care Activities:  Self administers medications as prescribed Attends all scheduled provider appointments Calls pharmacy for medication refills Performs ADL's independently Patient Goals: - eat healthy - get at least 8 hours of sleep at night - keep the bedroom cool and dark - maintain a healthy weight - stop using electronic devices like cell phone or video game when getting ready for sleep - use a fan or white noise in bedroom - use cooling vest - use devices that will help like a cane, sock-puller or reacher - use meditation or relaxation techniques - add more outdoor lighting - always use handrails on the stairs - always wear shoes or slippers with non-slip sole - install bathroom grab bars -   keep a flashlight by the bed - keep cell phone with me always - make an emergency alert plan in case I fall - pick up clutter from the floors - use a cane or walker - use a nightlight in the bathroom, halls - use a nonslip pad with throw rugs, or remove them completely -  always wear low-heeled or flat shoes or slippers with nonskid soles Follow Up Plan: Telephone follow up appointment with care management team member scheduled for: 12/23/20 at 1 PM The patient has been provided with contact information for the care management team and has been advised to call with any health related questions or concerns.      Patient Care Plan: Hypertension (Adult)     Problem Identified: Lack of self managment of Hypertension (Hypertension)   Priority: Medium     Long-Range Goal: Effective self management of Hypertension   Start Date: 11/22/2020  Expected End Date: 04/17/2021  This Visit's Progress: On track  Priority: Medium  Note:   Objective:  Last practice recorded BP readings:  BP Readings from Last 3 Encounters:  07/15/20 120/78  03/19/20 (!) 146/112  12/19/19 (!) 142/80   Most recent eGFR/CrCl: No results found for: EGFR  No components found for: CRCL Current Barriers:  Knowledge Deficits related to basic understanding of hypertension pathophysiology and self care management Knowledge Deficits related to understanding of medications prescribed for management of hypertension Difficulty obtaining medications Financial Constraints.  Unable to independently self manage HTN Does not adhere to prescribed medication regimen Pt states that her B/P goes up and down.  States she is currently not taking any B/P medications because her B/P was too low.  States she has not been checking her B/P at home recently but she does have a B/P monitor.  States she tries to follow a low salt diet but they do eat take out frequently Case Manager Clinical Goal(s):  patient will verbalize understanding of plan for hypertension management patient will attend all scheduled medical appointments: CCM pharmacist 11/26/20, [facility] 02/13/21 patient will demonstrate improved health management independence as evidenced by checking blood pressure as directed and notifying PCP if SBP>160 or  DBP > 90, taking all medications as prescribe, and adhering to a low sodium diet as discussed. Interventions:  Collaboration with Fry, Stephen A, MD regarding development and update of comprehensive plan of care as evidenced by provider attestation and co-signature Inter-disciplinary care team collaboration (see longitudinal plan of care) Evaluation of current treatment plan related to hypertension self management and patient's adherence to plan as established by provider. Provided education to patient re: stroke prevention, s/s of heart attack and stroke, DASH diet, complications of uncontrolled blood pressure Reviewed medications with patient and discussed importance of compliance Discussed plans with patient for ongoing care management follow up and provided patient with direct contact information for care management team Advised patient, providing education and rationale, to monitor blood pressure daily and record, calling PCP for findings outside established parameters.  Reviewed scheduled/upcoming provider appointments including: CCM pharmacist 11/26/20, [facility] 02/13/21 Self-Care Activities: - Self administers medications as prescribed Attends all scheduled provider appointments Calls provider office for new concerns, questions, or BP outside discussed parameters Checks BP and records as discussed Follows a low sodium diet/DASH diet Patient Goals: - check blood pressure 3 times per week - choose a place to take my blood pressure (home, clinic or office, retail store) - write blood pressure results in a log or diary - ask questions to understand    Follow Up Plan: Telephone follow up appointment with care management team member scheduled for: 12/23/20 at 1 PM  The patient has been provided with contact information for the care management team and has been advised to call with any health related questions or concerns.      Patient Care Plan: GERD     Problem Identified: Health  Promotion or Disease Self-Management of GERD   Priority: Medium     Long-Range Goal: Self-Management Plan Developed for GERD   Start Date: 11/22/2020  Expected End Date: 04/17/2021  This Visit's Progress: On track  Priority: Medium  Note:   Current Barriers:  Ineffective Self Health Maintenance of GERD Unable to independently self manage GERD Unable to self administer medications as prescribed States she has had heartburn and GI problems for a long time due to her MS.  STates she is followed by GI at [facility] and she is to have a scope on 11/28/20.  States her medications for her stomach are expensive since she needs to take liquid medications or crush her pills due to her swallowing difficulties from her MS  Clinical Goal(s):  Collaboration with Laurey Morale, MD regarding development and update of comprehensive plan of care as evidenced by provider attestation and co-signature Inter-disciplinary care team collaboration (see longitudinal plan of care) patient will work with care management team to address care coordination and chronic disease management needs related to Disease Management Educational Needs Care Coordination Medication Assistance  Psychosocial Support   Interventions:  Evaluation of current treatment plan related to  GERD , Financial constraints related to cost of medications, Limited access to food, Medication procurement, and ADL IADL limitations self-management and patient's adherence to plan as established by provider. Collaboration with Laurey Morale, MD regarding development and update of comprehensive plan of care as evidenced by provider attestation       and co-signature Inter-disciplinary care team collaboration (see longitudinal plan of care) Discussed plans with patient for ongoing care management follow up and provided patient with direct contact information for care management team Reviewed importance of taking medications as  ordered Encouraged to call [facility] to discuss getting her medications that need to be liquid approved due to her swallowing issues with MS Referred to LCSW for stress and difficulty coping with MS and financial strain Scheduled for visit with CCM pharmacist on 11/26/20 with concerns with cost of medications Care guide has been referred for food resources and housing repairs Self Care Activities:  Self administers medications as prescribed Attends all scheduled provider appointments Calls pharmacy for medication refills Performs ADL's independently Calls provider office for new concerns or questions Patient Goals: -keep appointment for endoscopy on 11/28/20 -Call [facility] concerning benefits Follow Up Plan: Telephone follow up appointment with care management team member scheduled for: 12/23/20 The patient has been provided with contact information for the care management team and has been advised to call with any health related questions or concerns.

## 2020-11-22 NOTE — Chronic Care Management (AMB) (Signed)
Chronic Care Management   CCM RN Visit Note  11/22/2020 Name: [name] MRN: [id number] DOB: 27-Jun-1965  Subjective: [name] is a 55 y.o. year old female who is a primary care patient of Laurey Morale, MD. The care management team was consulted for assistance with disease management and care coordination needs.    Engaged with patient by telephone for initial visit in response to provider referral for case management and/or care coordination services.   Consent to Services:  The patient was given the following information about Chronic Care Management services today, agreed to services, and gave verbal consent: 1. CCM service includes personalized support from designated clinical staff supervised by the primary care provider, including individualized plan of care and coordination with other care providers 2. 24/7 contact phone numbers for assistance for urgent and routine care needs. 3. Service will only be billed when office clinical staff spend 20 minutes or more in a month to coordinate care. 4. Only one practitioner may furnish and bill the service in a calendar month. 5.The patient may stop CCM services at any time (effective at the end of the month) by phone call to the office staff. 6. The patient will be responsible for cost sharing (co-pay) of up to 20% of the service fee (after annual deductible is met). Patient agreed to services and consent obtained.  Patient agreed to services and verbal consent obtained.   Assessment: Review of patient past medical history, allergies, medications, health status, including review of consultants reports, laboratory and other test data, was performed as part of comprehensive evaluation and provision of chronic care management services.   SDOH (Social Determinants of Health) assessments and interventions performed:  SDOH Interventions    Flowsheet Row Most Recent Value  SDOH Interventions   Food Insecurity Interventions Other  (Comment)  [Care guide with resources]  Housing Interventions Other (Comment)  [Careguide referral for resources, LCSW for stress]  Stress Interventions Other (Comment)  [referral to LCSW]  Transportation Interventions Intervention Not Indicated        CCM Care Plan  Allergies  Allergen Reactions   Banana Swelling    Lips, tongue and face   Sulfa Antibiotics Diarrhea and Nausea And Vomiting    Syncope episode and was incoherent   Sulfacetamide Sodium Other (See Comments)    Syncope episode and was incoherent   Penicillins Diarrhea, Nausea And Vomiting and Other (See Comments)    Has patient had a PCN reaction causing immediate rash, facial/tongue/throat swelling, SOB or lightheadedness with hypotension: Yes Has patient had a PCN reaction causing severe rash involving mucus membranes or skin necrosis: No Has patient had a PCN reaction that required hospitalization No Has patient had a PCN reaction occurring within the last 10 years: No If all of the above answers are "NO", then may proceed with Cephalosporin use.  Passed out,     Outpatient Encounter Medications as of 11/22/2020  Medication Sig   acetaminophen (TYLENOL) 160 MG/5ML solution Take 320 mg by mouth 2 (two) times daily as needed for mild pain. Reported on 07/21/2015   cholecalciferol (VITAMIN D) 1000 units tablet Take 1,000 Units by mouth daily.   citalopram (CELEXA) 20 MG tablet Take 1 tablet (20 mg total) by mouth daily.   cyanocobalamin (,VITAMIN B-12,) 1000 MCG/ML injection Inject into the muscle.   cyclobenzaprine (FLEXERIL) 10 MG tablet Take 10 mg by mouth 2 (two) times daily as needed for muscle spasms.    diphenhydrAMINE (BENADRYL) 12.5 MG/5ML elixir Take  25 mg by mouth 4 (four) times daily as needed for allergies.   ergocalciferol (VITAMIN D2) 1.25 MG (50000 UT) capsule ergocalciferol (vitamin D2) 1,250 mcg (50,000 unit) capsule   famotidine (PEPCID) 40 MG/5ML suspension Take 5 mLs (40 mg total) by mouth 2 (two)  times daily.   ferrous sulfate 220 (44 Fe) MG/5ML solution Take by mouth.   fluticasone (FLONASE) 50 MCG/ACT nasal spray Place into the nose.   furosemide (LASIX) 40 MG tablet Take 1 tablet (40 mg total) by mouth daily as needed for fluid.   gabapentin (NEURONTIN) 300 MG capsule Take 300 mg by mouth 3 (three) times daily.   HYDROcodone-homatropine (HYCODAN) 5-1.5 MG/5ML syrup Take 5 mLs by mouth every 4 (four) hours as needed for cough. (Patient not taking: Reported on 11/12/2020)   hydrOXYzine (ATARAX/VISTARIL) 25 MG tablet TAKE 1 TABLET (25 MG TOTAL) BY MOUTH 3 (THREE) TIMES DAILY AS NEEDED FOR ITCHING.   ibuprofen (ADVIL,MOTRIN) 200 MG tablet Take 200 mg by mouth every 8 (eight) hours as needed.   levETIRAcetam (KEPPRA) 100 MG/ML solution Take 500 mg by mouth 2 (two) times daily.   levocetirizine (XYZAL) 5 MG tablet Take by mouth.   neomycin-polymyxin-hydrocortisone (CORTISPORIN) 3.5-10000-1 ophthalmic suspension Place 2 drops into both eyes every 4 (four) hours. (Patient not taking: Reported on 11/12/2020)   Oxcarbazepine (TRILEPTAL) 300 MG tablet Take 300 mg by mouth 2 (two) times daily.    pantoprazole (PROTONIX) 40 MG tablet Take 1 tablet (40 mg total) by mouth 2 (two) times daily before a meal.   propranolol (INDERAL) 20 MG tablet TAKE 1 OR 2 TABLETS THE MORNINGS OF MEDICAL OR DENTAL PROCEDURES   sucralfate (CARAFATE) 1 GM/10ML suspension Take 10 mLs (1 g total) by mouth 3 (three) times daily.   tizanidine (ZANAFLEX) 2 MG capsule Take 2 mg by mouth 2 (two) times daily.   No facility-administered encounter medications on file as of 11/22/2020.    Patient Active Problem List   Diagnosis Date Noted   Low back pain due to bilateral sciatica 02/22/2018   Essential hypertension 05/30/2015   Right-sided chest pain 06/27/2014   Microcytic anemia 06/27/2014   Multiple sclerosis exacerbation ([place]) 06/27/2014   Obesity (BMI 30-39.9) 06/27/2014   B12 deficiency 06/06/2013   Multiple sclerosis  ([place]) 08/12/2010   GERD 12/30/2006   PARESTHESIA 12/30/2006    Conditions to be addressed/monitored:HTN and Multiple Sclerosis, GERD  Care Plan : Multiple Sclerosis (Adult)  Updates made by [name], RN since 11/22/2020 12:00 AM     Problem: Lack of self management of Multiple Sclerosis   Priority: High     Long-Range Goal: Effective self management of Multiple Sclerosis   Start Date: 11/22/2020  This Visit's Progress: On track  Priority: High  Note:   Current Barriers:  Ineffective Self Health Maintenance of Multiple Sclerosis with hx of seizures, HTN, GERD Unable to independently self manage MS Unable to self administer medications as prescribed due to cost Does not adhere to provider recommendations re:  Unable to perform IADLs independently: needs assist with shopping, transportation and sometimes with dressing depending if MS is flaring up Pt states that she was dx with MS in 2008.  States she goes to neurology at [facility] for treatment.  States her MS has progressed to the point where she need to have chemo.  States she has good days and bad days .  States that the hot weather has really been difficult for her this year. States she has difficulty  swallowing liquids and her pills need to be crushed or in liquid form.  States her medications are too expensive and she can not afford some of them. STates she has been very stressed with dealing with her MS and financial difficulty since she and her husband are on disability  Clinical Goal(s):  Collaboration with Laurey Morale, MD regarding development and update of comprehensive plan of care as evidenced by provider attestation and co-signature Inter-disciplinary care team collaboration (see longitudinal plan of care) patient will work with care management team to address care coordination and chronic disease management needs related to Disease Management Educational Needs Care Coordination Medication Assistance  Psychosocial  Support   Interventions:  Evaluation of current treatment plan related to HTN and MS, seizures, GERD , Financial constraints related to medications,housing and food, Limited access to food, Medication procurement, and ADL IADL limitations self-management and patient's adherence to plan as established by provider. Collaboration with Laurey Morale, MD regarding development and update of comprehensive plan of care as evidenced by provider attestation       and co-signature Inter-disciplinary care team collaboration (see longitudinal plan of care) Discussed plans with patient for ongoing care management follow up and provided patient with direct contact information for care management team Reviewed fall prevention and home safety  Reviewed importance of pacing her activity and to avoid activity during the heat of the day Referred to LCSW for stress and difficulty coping with MS and financial strain Scheduled for visit with CCM pharmacist on 11/26/20 with concerns with cost of medications Care guide has been referred for food resources and housing repairs Self Care Activities:  Self administers medications as prescribed Attends all scheduled provider appointments Calls pharmacy for medication refills Performs ADL's independently Patient Goals: - eat healthy - get at least 8 hours of sleep at night - keep the bedroom cool and dark - maintain a healthy weight - stop using electronic devices like cell phone or video game when getting ready for sleep - use a fan or white noise in bedroom - use cooling vest - use devices that will help like a cane, sock-puller or reacher - use meditation or relaxation techniques - add more outdoor lighting - always use handrails on the stairs - always wear shoes or slippers with non-slip sole - install bathroom grab bars - keep a flashlight by the bed - keep cell phone with me always - make an emergency alert plan in case I fall - pick up clutter from the  floors - use a cane or walker - use a nightlight in the bathroom, halls - use a nonslip pad with throw rugs, or remove them completely - always wear low-heeled or flat shoes or slippers with nonskid soles Follow Up Plan: Telephone follow up appointment with care management team member scheduled for: 12/23/20 at 1 PM The patient has been provided with contact information for the care management team and has been advised to call with any health related questions or concerns.      Care Plan : Hypertension (Adult)  Updates made by [name], RN since 11/22/2020 12:00 AM     Problem: Lack of self managment of Hypertension (Hypertension)   Priority: Medium     Long-Range Goal: Effective self management of Hypertension   Start Date: 11/22/2020  Expected End Date: 04/17/2021  This Visit's Progress: On track  Priority: Medium  Note:   Objective:  Last practice recorded BP readings:  BP Readings from Last 3 Encounters:  07/15/20 120/78  03/19/20 (!) 146/112  12/19/19 (!) 142/80   Most recent eGFR/CrCl: No results found for: EGFR  No components found for: CRCL Current Barriers:  Knowledge Deficits related to basic understanding of hypertension pathophysiology and self care management Knowledge Deficits related to understanding of medications prescribed for management of hypertension Difficulty obtaining medications Financial Constraints.  Unable to independently self manage HTN Does not adhere to prescribed medication regimen Pt states that her B/P goes up and down.  States she is currently not taking any B/P medications because her B/P was too low.  States she has not been checking her B/P at home recently but she does have a B/P monitor.  States she tries to follow a low salt diet but they do eat take out frequently Case Manager Clinical Goal(s):  patient will verbalize understanding of plan for hypertension management patient will attend all scheduled medical appointments: CCM  pharmacist 11/26/20, [place] neurology 02/13/21 patient will demonstrate improved health management independence as evidenced by checking blood pressure as directed and notifying PCP if SBP>160 or DBP > 90, taking all medications as prescribe, and adhering to a low sodium diet as discussed. Interventions:  Collaboration with Laurey Morale, MD regarding development and update of comprehensive plan of care as evidenced by provider attestation and co-signature Inter-disciplinary care team collaboration (see longitudinal plan of care) Evaluation of current treatment plan related to hypertension self management and patient's adherence to plan as established by provider. Provided education to patient re: stroke prevention, s/s of heart attack and stroke, DASH diet, complications of uncontrolled blood pressure Reviewed medications with patient and discussed importance of compliance Discussed plans with patient for ongoing care management follow up and provided patient with direct contact information for care management team Advised patient, providing education and rationale, to monitor blood pressure daily and record, calling PCP for findings outside established parameters.  Reviewed scheduled/upcoming provider appointments including: CCM pharmacist 11/26/20, [place] neurology 02/13/21 Self-Care Activities: - Self administers medications as prescribed Attends all scheduled provider appointments Calls provider office for new concerns, questions, or BP outside discussed parameters Checks BP and records as discussed Follows a low sodium diet/DASH diet Patient Goals: - check blood pressure 3 times per week - choose a place to take my blood pressure (home, clinic or office, retail store) - write blood pressure results in a log or diary - ask questions to understand  Follow Up Plan: Telephone follow up appointment with care management team member scheduled for: 12/23/20 at 1 PM  The patient has been provided with  contact information for the care management team and has been advised to call with any health related questions or concerns.      Care Plan : GERD  Updates made by [name], RN since 11/22/2020 12:00 AM     Problem: Health Promotion or Disease Self-Management of GERD   Priority: Medium     Long-Range Goal: Self-Management Plan Developed for GERD   Start Date: 11/22/2020  Expected End Date: 04/17/2021  This Visit's Progress: On track  Priority: Medium  Note:   Current Barriers:  Ineffective Self Health Maintenance of GERD Unable to independently self manage GERD Unable to self administer medications as prescribed States she has had heartburn and GI problems for a long time due to her MS.  STates she is followed by GI at [facility] at [facility] and she is to have a scope on 11/28/20.  States her medications for her stomach are expensive since she  needs to take liquid medications or crush her pills due to her swallowing difficulties from her MS  Clinical Goal(s):  Collaboration with Laurey Morale, MD regarding development and update of comprehensive plan of care as evidenced by provider attestation and co-signature Inter-disciplinary care team collaboration (see longitudinal plan of care) patient will work with care management team to address care coordination and chronic disease management needs related to Disease Management Educational Needs Care Coordination Medication Assistance  Psychosocial Support   Interventions:  Evaluation of current treatment plan related to  GERD , Financial constraints related to cost of medications, Limited access to food, Medication procurement, and ADL IADL limitations self-management and patient's adherence to plan as established by provider. Collaboration with Laurey Morale, MD regarding development and update of comprehensive plan of care as evidenced by provider attestation       and co-signature Inter-disciplinary care team collaboration  (see longitudinal plan of care) Discussed plans with patient for ongoing care management follow up and provided patient with direct contact information for care management team Reviewed importance of taking medications as ordered Encouraged to call [facility] [employer] to discuss getting her medications that need to be liquid approved due to her swallowing issues with MS Referred to LCSW for stress and difficulty coping with MS and financial strain Scheduled for visit with CCM pharmacist on 11/26/20 with concerns with cost of medications Care guide has been referred for food resources and housing repairs Self Care Activities:  Self administers medications as prescribed Attends all scheduled provider appointments Calls pharmacy for medication refills Performs ADL's independently Calls provider office for new concerns or questions Patient Goals: -keep appointment for endoscopy on 11/28/20 -Call [facility] [employer] concerning benefits Follow Up Plan: Telephone follow up appointment with care management team member scheduled for: 12/23/20 The patient has been provided with contact information for the care management team and has been advised to call with any health related questions or concerns.       Plan:Telephone follow up appointment with care management team member scheduled for:  12/23/20 and The patient has been provided with contact information for the care management team and has been advised to call with any health related questions or concerns.  Peter Garter RN, Jackquline Denmark, CDE Care Management Coordinator [facility] [phone], Mobile [phone]

## 2020-11-25 ENCOUNTER — Telehealth: Payer: Self-pay | Admitting: *Deleted

## 2020-11-25 NOTE — Chronic Care Management (AMB) (Signed)
  Chronic Care Management   Note  11/25/2020 Name: [name] MRN: [id number] DOB: 12/13/1965  [name] is a 55 y.o. year old female who is a primary care patient of Laurey Morale, MD. [name] is currently enrolled in care management services. An additional referral for Licensed Clinical SW  was placed by [facility].   Follow up plan: Unsuccessful telephone outreach attempt made. A HIPAA compliant phone message was left for the patient providing contact information and requesting a return call.  The care management team will reach out to the patient again over the next 7 days.  If patient returns call to provider office, please advise to call [place] at [phone].  [place] [facility]

## 2020-11-26 ENCOUNTER — Ambulatory Visit: Payer: PPO | Admitting: Pharmacist

## 2020-11-26 DIAGNOSIS — K2101 Gastro-esophageal reflux disease with esophagitis, with bleeding: Secondary | ICD-10-CM

## 2020-11-26 DIAGNOSIS — I10 Essential (primary) hypertension: Secondary | ICD-10-CM

## 2020-11-26 NOTE — Progress Notes (Signed)
Chronic Care Management Pharmacy Note  01/06/2021 Name:  [name] MRN:  [id number] DOB:  09-13-1965  Summary: Pt is having issues affording medications Pt has stopped taking several medications  Recommendations/Changes made from today's visit: -Recommended for patient to apply for Medicare extra help and provided application information -Recommended routine BP monitoring at home given previous history of elevated BP  Plan: Apply for medicare extra help Apply for tier exceptions for medications Look for goodrx coupons for medications  Subjective: [name] is an 55 y.o. year old female who is a primary patient of Laurey Morale, MD.  The CCM team was consulted for assistance with disease management and care coordination needs.    Engaged with patient by telephone for initial visit in response to provider referral for pharmacy case management and/or care coordination services.   Consent to Services:  The patient was given the following information about Chronic Care Management services today, agreed to services, and gave verbal consent: 1. CCM service includes personalized support from designated clinical staff supervised by the primary care provider, including individualized plan of care and coordination with other care providers 2. 24/7 contact phone numbers for assistance for urgent and routine care needs. 3. Service will only be billed when office clinical staff spend 20 minutes or more in a month to coordinate care. 4. Only one practitioner may furnish and bill the service in a calendar month. 5.The patient may stop CCM services at any time (effective at the end of the month) by phone call to the office staff. 6. The patient will be responsible for cost sharing (co-pay) of up to 20% of the service fee (after annual deductible is met). Patient agreed to services and consent obtained.  Patient Care Team: Laurey Morale, MD as PCP - General (Family  Medicine) [name], MD as Referring Physician (Neurology) [name], RN as Case Manager [name], [facility] as Pharmacist (Pharmacist) [place], Maree Erie, LCSW as Social Worker (Licensed Clinical Social Worker)  Recent office visits: 11/12/20 Randel Pigg, LPN: Patient presented for AWV.  07/15/20 Laurey Morale, MD: Patient was seen for hospital follow up from [facility]. She continued pantoprazole 40 mg and sucralfate 1 g as written from discharge. Changed Famotidine 40 mg Oral 2 times daily. Prescribed hydrocodone-homatropine 5-1.5 MG/5ML 5 mLs Oral Every 4 hours as needed.  Recent consult visits: 09/24/20 Junita Push, PA-C (gastroenterology): Patient presented for initial consult for erosive esophagitis. Plan for EGD.  08/01/20 [name], MD (neurology): Patient presented for MS follow up.  Hospital visits: Medication Reconciliation was completed by comparing discharge summary, patient's EMR and Pharmacy list, and upon discussion with patient.   Admitted to the hospital on 04.17.2022 due to Weakness. Discharge date was 04.18.2022. Discharged from [place]?Medications Started at [facility] Discharge:?? -started the following medication Famotidine 40 mg/30ml     Medication Changes at Hospital Discharge: -Changed none   Medications Discontinued at Hospital Discharge: -Stopped none   Medications that remain the same after Hospital Discharge:?? -All other medications will remain the same.     Hospital visits:  Medication Reconciliation was completed by comparing discharge summary, patient's EMR and Pharmacy list, and upon discussion with patient.   Admitted to the hospital on 07/06/2020 due to Multiple Sclerosis. Discharge date was 07/06/2020. Discharged from [place]?Medications Started at [facility] Discharge:?? -started the following medications Famotidine 40 mg/5 mg: take  2.5 mls by mouth 2 times daily Ondansetron 4 mg: take one tablet by mouth every 6 hours as needed for 7 days mag-alum hydroxide-simethicone    Medication Changes at Hospital Discharge: -Changed None   Medications Discontinued at Hospital Discharge: -Stopped None   Medications that remain the same after Hospital Discharge:?? -All other medications will remain the same.   Objective:  Lab Results  Component Value Date   CREATININE 0.96 06/02/2018   BUN 12 12/20/2018   GFR 74.64 03/09/2018   GFRNONAA >60 06/02/2018   GFRAA >60 06/02/2018   NA 141 12/20/2018   K 4.0 12/20/2018   CALCIUM 8.9 06/02/2018   CO2 22 06/02/2018   GLUCOSE 91 06/02/2018    Lab Results  Component Value Date/Time   HGBA1C 6.1 (H) 10/17/2012 01:55 PM   GFR 74.64 03/09/2018 04:22 PM   GFR 75.58 12/14/2017 03:33 PM    Last diabetic Eye exam: No results found for: HMDIABEYEEXA  Last diabetic Foot exam: No results found for: HMDIABFOOTEX   Lab Results  Component Value Date   CHOL 216 (H) 11/09/2016   HDL 63.00 11/09/2016   LDLCALC 137 (H) 11/09/2016   TRIG 79.0 11/09/2016   CHOLHDL 3 11/09/2016    Hepatic Function Latest Ref Rng & Units 12/20/2018 03/09/2018 12/14/2017  Total Protein 6.0 - 8.3 g/dL - 6.8 7.1  Albumin 3.5 - 5.2 g/dL - 4.0 4.1  AST 13 - 35 $Re'15 9 14  'FLR$ ALT 7 - 35 $Re'10 8 13  'QXE$ Alk Phosphatase 25 - 125 107 83 93  Total Bilirubin 0.2 - 1.2 mg/dL - 0.3 0.4  Bilirubin, Direct 0.0 - 0.3 mg/dL - - -    Lab Results  Component Value Date/Time   TSH 2.112 06/02/2018 06:57 PM   TSH 2.18 01/22/2017 03:03 PM   TSH 2.08 05/30/2013 09:12 AM   FREET4 0.56 (L) 01/22/2017 03:03 PM   FREET4 0.53 (L) 11/09/2016 11:03 AM    CBC Latest Ref Rng & Units 12/20/2018 06/02/2018 03/09/2018  WBC - 6.4 6.8 11.4(H)  Hemoglobin 12.0 - 16.0 11.9(A) 13.2 11.1(L)  Hematocrit 36 - 46 37 43.1 34.7(L)  Platelets 150 - 399 226 266 251.0    Lab Results  Component Value Date/Time   VD25OH 12.63 (L) 11/09/2016 11:03 AM     Clinical ASCVD: No  The ASCVD Risk score Mikey Bussing DC Jr., et al., 2013) failed to calculate for the following reasons:   Cannot find a previous HDL lab   Cannot find a previous total cholesterol lab    Depression screen [facility] 2/9 12/12/2020 11/22/2020 11/12/2020  Decreased Interest 0 0 1  Down, Depressed, Hopeless 1 0 0  PHQ - 2 Score 1 0 1      Social History   Tobacco Use  Smoking Status Never   Passive exposure: Past  Smokeless Tobacco Never   BP Readings from Last 3 Encounters:  07/15/20 120/78  03/19/20 (!) 146/112  12/19/19 (!) 142/80   Pulse Readings from Last 3 Encounters:  07/15/20 (!) 102  03/19/20 77  12/19/19 83   Wt Readings from Last 3 Encounters:  07/15/20 244 lb (110.7 kg)  03/19/20 251 lb (113.9 kg)  12/19/19 251 lb 6.4 oz (114 kg)   BMI Readings from Last 3 Encounters:  07/15/20 43.23 kg/m  03/19/20 44.47 kg/m  12/19/19 44.53 kg/m    Assessment/Interventions: Review of patient past medical history, allergies, medications, health status, including review of consultants reports, laboratory and other test data, was performed as  part of comprehensive evaluation and provision of chronic care management services.   SDOH:  (Social Determinants of Health) assessments and interventions performed: Yes  SDOH Screenings   Alcohol Screen: Low Risk    Last Alcohol Screening Score (AUDIT): 0  Depression (PHQ2-9): Low Risk    PHQ-2 Score: 1  Financial Resource Strain: High Risk   Difficulty of Paying Living Expenses: Hard  Food Insecurity: Food Insecurity Present   Worried About Charity fundraiser in the Last Year: Sometimes true   Ran Out of Food in the Last Year: Sometimes true  Housing: High Risk   Last Housing Risk Score: 2  Physical Activity: Insufficiently Active   Days of Exercise per Week: 3 days   Minutes of Exercise per Session: 30 min  Social Connections: Moderately Isolated   Frequency of Communication with Friends and Family: More than  three times a week   Frequency of Social Gatherings with Friends and Family: More than three times a week   Attends Religious Services: Never   Marine scientist or Organizations: No   Attends Music therapist: Never   Marital Status: Married  Stress: Stress Concern Present   Feeling of Stress : Rather much  Tobacco Use: Low Risk    Smoking Tobacco Use: Never   Smokeless Tobacco Use: Never  Transportation Needs: No Transportation Needs   Lack of Transportation (Medical): No   Lack of Transportation (Non-Medical): No   Patient's biggest concern is the cost of her medications right now and not being able to swallow or take her medications appropriately. She has been dealing with only eating one time a day.  She has been taking care of her husband who is also on disability with dialysis and going through a lot of changes with MS (has had suffered from many different symptoms. She has support from her sister who comes to help with washing her hair. She gets tired very easily and is resting a lot lately. The heat is draining her energy.  She reports she cannot cook because of the heat. Her and husband were alternating with cooking and they have been eating out door. She has been eating popcorn, sometimes salad. She doesn't qualify for meals on wheels.  She is having surgery on Thursday and has not taking medications since April. She is still throwing up and having chronic acid reflux. Her famotidine is $45 for a week and several other medications are too expensive.  CCM Care Plan  Allergies  Allergen Reactions   Banana Swelling    Lips, tongue and face   Sulfa Antibiotics Diarrhea and Nausea And Vomiting    Syncope episode and was incoherent   Sulfacetamide Sodium Other (See Comments)    Syncope episode and was incoherent   Penicillins Diarrhea, Nausea And Vomiting and Other (See Comments)    Has patient had a PCN reaction causing immediate rash, facial/tongue/throat  swelling, SOB or lightheadedness with hypotension: Yes Has patient had a PCN reaction causing severe rash involving mucus membranes or skin necrosis: No Has patient had a PCN reaction that required hospitalization No Has patient had a PCN reaction occurring within the last 10 years: No If all of the above answers are "NO", then may proceed with Cephalosporin use.  Passed out,     Medications Reviewed Today     Reviewed by [name], RN (Registered Nurse) on 12/23/20 at 1522  Med List Status: <None>   Medication Order Taking? Sig Documenting Provider Last  Dose Status Informant  acetaminophen (TYLENOL) 160 MG/5ML solution [id number] No Take 320 mg by mouth 2 (two) times daily as needed for mild pain. Reported on 07/21/2015 [provider] Taking Active Self  cholecalciferol (VITAMIN D) 1000 units tablet [id number] No Take 1,000 Units by mouth daily. [provider] Taking Active   citalopram (CELEXA) 20 MG tablet [id number] No Take 1 tablet (20 mg total) by mouth daily. Laurey Morale, MD Taking Active   cyanocobalamin (,VITAMIN B-12,) 1000 MCG/ML injection [id number] No Inject into the muscle.  Patient not taking: Reported on 11/26/2020   [provider] Not Taking Active   cyclobenzaprine (FLEXERIL) 10 MG tablet [id number] No Take 10 mg by mouth 2 (two) times daily as needed for muscle spasms.   Patient not taking: Reported on 11/26/2020   [provider] Not Taking Active Self  diphenhydrAMINE (BENADRYL) 12.5 MG/5ML elixir [id number] No Take 25 mg by mouth 4 (four) times daily as needed for allergies. [provider] Taking Active Self  famotidine (PEPCID) 40 MG/5ML suspension [id number] No Take 5 mLs (40 mg total) by mouth 2 (two) times daily. Laurey Morale, MD Taking Active   ferrous sulfate 220 (44 Fe) MG/5ML solution [id number] No Take by mouth.  Patient not taking: Reported on 11/26/2020   [provider] Not Taking Active    fluticasone (FLONASE) 50 MCG/ACT nasal spray [id number] No Place into the nose. [provider] Taking Active   furosemide (LASIX) 40 MG tablet [id number] No Take 1 tablet (40 mg total) by mouth daily as needed for fluid. Laurey Morale, MD Taking Active   gabapentin (NEURONTIN) 300 MG capsule [id number] No Take 300 mg by mouth 3 (three) times daily. [provider] Taking Active   hydrOXYzine (ATARAX/VISTARIL) 25 MG tablet [id number] No TAKE 1 TABLET (25 MG TOTAL) BY MOUTH 3 (THREE) TIMES DAILY AS NEEDED FOR ITCHING. Marletta Lor, MD Taking Active   levETIRAcetam [facility]) 100 MG/ML solution [id number] No Take 500 mg by mouth 2 (two) times daily. [provider] Taking Active Self  levocetirizine (XYZAL) 5 MG tablet [id number] No Take by mouth. [provider] Taking Active   Oxcarbazepine (TRILEPTAL) 300 MG tablet [id number] No Take 300 mg by mouth 2 (two) times daily.  [provider] Taking Active Self  pantoprazole (PROTONIX) 40 MG tablet [id number] No Take 1 tablet (40 mg total) by mouth 2 (two) times daily before a meal.  Patient not taking: Reported on 11/26/2020   Laurey Morale, MD Not Taking Active   propranolol (INDERAL) 20 MG tablet [id number] No TAKE 1 OR 2 TABLETS THE MORNINGS OF MEDICAL OR DENTAL PROCEDURES Laurey Morale, MD Taking Active   sucralfate (CARAFATE) 1 GM/10ML suspension [id number] No Take 10 mLs (1 g total) by mouth 3 (three) times daily. Laurey Morale, MD Taking Active   tizanidine (ZANAFLEX) 2 MG capsule [id number] No Take 2 mg by mouth 2 (two) times daily. [provider] Taking Active Self            Patient Active Problem List   Diagnosis Date Noted   Low back pain due to bilateral sciatica 02/22/2018   Essential hypertension 05/30/2015   Right-sided chest pain 06/27/2014   Microcytic anemia 06/27/2014   Multiple sclerosis exacerbation ([place]) 06/27/2014   Obesity (BMI 30-39.9) 06/27/2014   B12 deficiency  06/06/2013   Multiple sclerosis ([place]) 08/12/2010   GERD 12/30/2006   PARESTHESIA 12/30/2006    Immunization History  Administered Date(s)  Administered   PPD Test 07/09/2014   Td 05/18/2000   Tdap 03/29/2015    Conditions to be addressed/monitored:  Hypertension, GERD, and MS, vitamin B12 deficiency, anemia  Care Plan : [place]  Updates made by [name], [place] since 01/06/2021 12:00 AM     Problem: Problem: Hypertension, GERD, and MS, vitamin B12 deficiency, anemia      Long-Range Goal: Patient-Specific Goal   Start Date: 11/26/2020  Expected End Date: 11/26/2021  This Visit's Progress: On track  Priority: High  Note:   Current Barriers:  Unable to independently afford treatment regimen Unable to independently monitor therapeutic efficacy  Pharmacist Clinical Goal(s):  Patient will verbalize ability to afford treatment regimen achieve adherence to monitoring guidelines and medication adherence to achieve therapeutic efficacy through collaboration with PharmD and provider.   Interventions: 1:1 collaboration with Laurey Morale, MD regarding development and update of comprehensive plan of care as evidenced by provider attestation and co-signature Inter-disciplinary care team collaboration (see longitudinal plan of care) Comprehensive medication review performed; medication list updated in electronic medical record  Hypertension (BP goal <130/80) -Not ideally controlled -Current treatment: Furosemide 40 mg 1 tablet daily as needed -Medications previously tried: HCTZ, lisinopril -Current home readings: does not check -Current dietary habits: doesn't use added salt; doesn't look at package labels -Current exercise habits: not able to exercise -Denies hypotensive/hypertensive symptoms -Educated on BP goals and benefits of medications for prevention of heart attack, stroke and kidney damage; Importance of home blood pressure monitoring; Proper BP  monitoring technique; -Counseled to monitor BP at home weekly, document, and provide log at future appointments -Counseled on diet and exercise extensively  Swelling (Goal: minimize swelling) -Controlled -Current treatment  Furosemide 40 mg 1 tablet daily as needed -Medications previously tried: none  -Recommended to continue current medication Counseled on importance of daily weights.  Depression/Anxiety (Goal: minimize symptoms) -Controlled -Current treatment: Citalopram 20 mg 1 tablet daily -Medications previously tried/failed: none -PHQ9: 0 -GAD7: n/a -Educated on Benefits of medication for symptom control Benefits of cognitive-behavioral therapy with or without medication -Recommended to continue current medication Patient does not wish to try another medication at this time.  Anemia (Goal: HgB>11) -Not ideally controlled -Current treatment  Ferrous sulfate 220 mg/5 mL - not taking consistently -Medications previously tried: none  -Recommended to continue current medication Counseled on importance of taking this medication.  Seizures (Goal: prevent seizures) -Uncontrolled -Current treatment  Levetiracetam 100 mg/mL 500 mg twice daily Oxcarbazepine 300 mg 1 tablet twice daily -Medications previously tried: unknown  -Counseled on importance of taking these medications consistently. Patient does not take these twice daily due to drowsiness and cost.  GERD/erosive esophagitis (Goal: minimize symptoms) -Uncontrolled -Current treatment  Famotidine 40 mg/66mL suspension  Pantoprazole 40 mg 1 tablet twice daily before a meal  Sucralfate 1 gm/17mL 10 mLs by mouth three times daily -Medications previously tried: unknown  -Recommended to continue current medication Counseled on efficacy of pantoprazole will not be achieved with crushing or biting this medication. Patient did not want to switch to any other medications.  Itching/allergic rhinitis (Goal: minimize  symptoms) -Uncontrolled -Current treatment  Flonase 50 mcg/act 1 spray as needed Diphenhydramine 12.5 mg/21mL daily as needed Hydroxyzine 25 mg 1 tablet as needed for itching Levocetirizine 5 mg 1 tablet daily -Medications previously tried: n/a  -Recommended to continue current medication Patient was following with an allergist but does not want to follow up.  Muscle spasms (Goal: minimize symptoms) -Controlled -Current treatment  Tizanidine 2 mg 1 capsule twice daily Cyclobenzaprine 10 mg as needed for muscle spasms -Medications previously tried: none  -Counseled on not using both cyclobenzaprine and tizanidine at the same time.   Health Maintenance -Vaccine gaps: shingrix, COVID, prevnar -Current therapy:  Vitamin B12 1000 mcg/mL  Vitamin D 1000 units daily Propranolol 20 mg 1 or 2 tablets the morning of dental procedures  Acetaminophen 160 mg/30mL 320 mg twice daily as needed - using on -Educated on Cost vs benefit of each product must be carefully weighed by individual consumer -Patient is satisfied with current therapy and denies issues -Counseled on avoidance of NSAIDS  Patient Goals/Self-Care Activities Patient will:  - take medications as prescribed check blood pressure weekly, document, and provide at future appointments  Follow Up Plan: The care management team will reach out to the patient again over the next 30 days.        Medication Assistance:  Provided information to apply for Medicare extra help.  Compliance/Adherence/Medication fill history: Care Gaps: COVID booster, prevnar, Hep C screening, pap smear, colonoscopy, mammogram, shingrix  Star-Rating Drugs: None  Patient's preferred pharmacy is:  [id number] [address], [place] [address] Phone: [phone] Fax: [phone]  [id number] [place], [place] [address] [place] [address] Phone: [phone] Fax: [phone]  Uses pill box? Yes Pt endorses 50% compliance  We discussed: Current pharmacy is preferred with insurance plan and patient is satisfied with pharmacy services Patient decided to: Continue current medication management strategy  Care Plan and Follow Up Patient Decision:  Patient agrees to Care Plan and Follow-up.  Plan: The care management team will reach out to the patient again over the next 30 days.  Jeni Salles, PharmD, [place] Pharmacist [facility] [facility] [place]

## 2020-11-28 DIAGNOSIS — K640 First degree hemorrhoids: Secondary | ICD-10-CM | POA: Diagnosis not present

## 2020-11-28 DIAGNOSIS — K208 Other esophagitis without bleeding: Secondary | ICD-10-CM | POA: Diagnosis not present

## 2020-11-28 DIAGNOSIS — Z1211 Encounter for screening for malignant neoplasm of colon: Secondary | ICD-10-CM | POA: Diagnosis not present

## 2020-11-28 DIAGNOSIS — K2100 Gastro-esophageal reflux disease with esophagitis, without bleeding: Secondary | ICD-10-CM | POA: Diagnosis not present

## 2020-11-28 DIAGNOSIS — K6389 Other specified diseases of intestine: Secondary | ICD-10-CM | POA: Diagnosis not present

## 2020-11-28 DIAGNOSIS — K449 Diaphragmatic hernia without obstruction or gangrene: Secondary | ICD-10-CM | POA: Diagnosis not present

## 2020-11-28 DIAGNOSIS — K2210 Ulcer of esophagus without bleeding: Secondary | ICD-10-CM | POA: Diagnosis not present

## 2020-11-28 LAB — HM COLONOSCOPY

## 2020-12-02 NOTE — Chronic Care Management (AMB) (Signed)
  Chronic Care Management   Note  12/02/2020 Name: TANEESHA EDGIN MRN: [id number] DOB: January 30, 1966  CALLISTA HOH is a 55 y.o. year old female who is a primary care patient of Laurey Morale, MD. SHADASIA OLDFIELD is currently enrolled in care management services. An additional referral for SW was placed.   Follow up plan: A second unsuccessful telephone outreach attempt made. A HIPAA compliant phone message was left for the patient providing contact information and requesting a return call.  The care management team will reach out to the patient again over the next 7 days.  If patient returns call to provider office, please advise to call [place] at [place] [facility]

## 2020-12-03 NOTE — Chronic Care Management (AMB) (Signed)
  Chronic Care Management   Note  12/03/2020 Name: [name] MRN: [id number] DOB: [birth year]  [name] is a 55 y.o. year old female who is a primary care patient of Laurey Morale, MD. [name] is currently enrolled in care management services. An additional referral for SW was placed.   Follow up plan: Telephone appointment with care management team member scheduled for:12/12/20  [place] [facility]

## 2020-12-04 ENCOUNTER — Encounter: Payer: Self-pay | Admitting: Family Medicine

## 2020-12-10 DIAGNOSIS — Z20822 Contact with and (suspected) exposure to covid-19: Secondary | ICD-10-CM | POA: Diagnosis not present

## 2020-12-12 ENCOUNTER — Ambulatory Visit: Payer: PPO | Admitting: *Deleted

## 2020-12-12 DIAGNOSIS — N179 Acute kidney failure, unspecified: Secondary | ICD-10-CM | POA: Diagnosis not present

## 2020-12-12 DIAGNOSIS — K2101 Gastro-esophageal reflux disease with esophagitis, with bleeding: Secondary | ICD-10-CM

## 2020-12-12 DIAGNOSIS — I10 Essential (primary) hypertension: Secondary | ICD-10-CM

## 2020-12-12 DIAGNOSIS — R209 Unspecified disturbances of skin sensation: Secondary | ICD-10-CM

## 2020-12-12 DIAGNOSIS — G35 Multiple sclerosis: Secondary | ICD-10-CM

## 2020-12-12 DIAGNOSIS — Z5941 Food insecurity: Secondary | ICD-10-CM

## 2020-12-12 DIAGNOSIS — E669 Obesity, unspecified: Secondary | ICD-10-CM

## 2020-12-12 NOTE — Patient Instructions (Signed)
Visit Information   PATIENT GOALS:   Goals Addressed               This Visit's Progress     Find Help in My Community. (pt-stated)   On track     Timeframe:  Short-Term Goal Priority:  Medium Start Date:  12/12/2020                          Expected End Date:  02/12/2021                    Follow-Up Date: 12/20/2020 at 3:00pm.  Patient Goals/Self-Care Activities:  Review list of resources mailed to your home, which include all of the following:   [place] in [place] and [place] Programs Emergency Assistance Programs The [place] in [place] and [place] in [place] Tips to ensure eligibility. Complete application for Adult Medicaid and submit to the Department of Social Services for processing. Call [place] [phone]) to periodically check the status of your Adult Medicaid application. Complete Section 8 Housing application and submit to the [employer] for processing. Call the [employer] to periodically check the status of your Section 8 Housing application.   Contact list of [place] resources in [place]. Dealer. Contact list of Affordable Apartments and Rental Properties. Obtain financial assistance from [facility] and [employer]. Review The [place] in [place]. Review The [place] in [place]. Continue with compliance of taking medications exactly as prescribed.  Referral placed to [facility] for long-term mental health counseling and supportive services. Work with [employer] [facility] on a weekly basis  to obtain housing, food, financial assistance, prescription medications, and mental health counseling and resources.         Reduce and Manage Symptoms of Depression and Anxiety. (pt-stated)   On track     Timeframe:  Short-Term Goal Priority:  High Start Date:  12/12/2020                          Expected End Date:  02/12/2021                     Follow-Up Date: 12/20/2020 at 3:00pm.   Patient Goals/Self-Care Activities: Avoid negative self-talk and practice positive thinking and self-talk. Exercise at least 2 to 3 times per week, or as tolerated. Journal feelings and what helps to feel better or worse. Spend time with friends and engage in fun activities, at least 2 to 3 times per week. Watch for early signs of feeling worse. Begin personal counseling with LCSW on a weekly basis, to reduce and manage symptoms. Consider self-enrollment in a support group.   Practice relaxation techniques, deep breathing exercises and mindfulness meditation strategies, daily. Talk about feelings with a friend, family member, or spiritual advisor. Continue with compliance of taking prescription medications. Try to obtain adequate rest, stay well-hydrated and eat a healthy, well-balanced diet. Referral placed to [facility] for ongoing mental health counseling and supportive services.          Consent to CCM Services: Ms. Sistare was given information  about Chronic Care Management services today including:  CCM service includes personalized support from designated clinical staff supervised by her physician, including individualized plan of care and coordination with other care providers 24/7 contact phone numbers for assistance for urgent and routine care needs. Service will only be billed when office clinical staff spend 20 minutes or more in a month to coordinate care. Only one practitioner may furnish and bill the service in a calendar month. The patient may stop CCM services at any time  (effective at the end of the month) by phone call to the office staff. The patient will be responsible for cost sharing (co-pay) of up to 20% of the service fee (after annual deductible is met).  Patient agreed to services and verbal consent obtained.   Patient verbalizes understanding of instructions provided today and agrees to view in [place].   Telephone follow-up appointment with care management team member scheduled for:  12/20/2020 at 3:00pm.  Nat Christen LCSW Licensed Clinical Social Worker [facility] [phone]   CLINICAL CARE PLAN: Patient Care Plan: Multiple Sclerosis (Adult)     Problem Identified: Lack of self management of Multiple Sclerosis   Priority: High     Long-Range Goal: Effective self management of Multiple Sclerosis   Start Date: 11/22/2020  This Visit's Progress: On track  Priority: High  Note:   Current Barriers:  Ineffective Self Health Maintenance of Multiple Sclerosis with hx of seizures, HTN, GERD Unable to independently self manage MS Unable to self administer medications as prescribed due to cost Does not adhere to provider recommendations re:  Unable to perform IADLs independently: needs assist with shopping, transportation and sometimes with dressing depending if MS is flaring up Pt states that she was dx with MS in 2008.  States she goes to neurology at [facility] for treatment.  States her MS has progressed to the point where she need to have chemo.  States she has good days and bad days .  States that the hot weather has really been difficult for her this year. States she has difficulty swallowing liquids and her pills need to be crushed or in liquid form.  States her medications are too expensive and she can not afford some of them. STates she has been very stressed with dealing with her MS and financial difficulty since she and her husband are on disability  Clinical Goal(s):  Collaboration with Laurey Morale, MD regarding development  and update of comprehensive plan of care as evidenced by provider attestation and co-signature Inter-disciplinary care team collaboration (see longitudinal plan of care) patient will work with care management team to address care coordination and chronic disease management needs related to Disease Management Educational Needs Care Coordination Medication Assistance  Psychosocial Support   Interventions:  Evaluation of current treatment plan related to HTN and MS, seizures, GERD , Financial constraints related to medications,housing and food, Limited access to food, Medication procurement, and ADL IADL limitations self-management and patient's adherence to plan as established by provider. Collaboration with Laurey Morale, MD regarding development and update of comprehensive plan of care as evidenced by provider attestation       and co-signature Inter-disciplinary care team collaboration (see longitudinal plan of care) Discussed plans with patient for ongoing care management follow up and provided patient with direct contact information for care management team Reviewed fall prevention and home safety  Reviewed importance of pacing her activity and to avoid activity during the heat of the day Referred to LCSW for  stress and difficulty coping with MS and financial strain Scheduled for visit with CCM pharmacist on 11/26/20 with concerns with cost of medications Care guide has been referred for food resources and housing repairs Self Care Activities:  Self administers medications as prescribed Attends all scheduled provider appointments Calls pharmacy for medication refills Performs ADL's independently Patient Goals: - eat healthy - get at least 8 hours of sleep at night - keep the bedroom cool and dark - maintain a healthy weight - stop using electronic devices like cell phone or video game when getting ready for sleep - use a fan or white noise in bedroom - use cooling vest - use devices  that will help like a cane, sock-puller or reacher - use meditation or relaxation techniques - add more outdoor lighting - always use handrails on the stairs - always wear shoes or slippers with non-slip sole - install bathroom grab bars - keep a flashlight by the bed - keep cell phone with me always - make an emergency alert plan in case I fall - pick up clutter from the floors - use a cane or walker - use a nightlight in the bathroom, halls - use a nonslip pad with throw rugs, or remove them completely - always wear low-heeled or flat shoes or slippers with nonskid soles Follow Up Plan: Telephone follow up appointment with care management team member scheduled for: 12/23/20 at 1 PM The patient has been provided with contact information for the care management team and has been advised to call with any health related questions or concerns.      Patient Care Plan: Hypertension (Adult)     Problem Identified: Lack of self managment of Hypertension (Hypertension)   Priority: Medium     Long-Range Goal: Effective self management of Hypertension   Start Date: 11/22/2020  Expected End Date: 04/17/2021  This Visit's Progress: On track  Priority: Medium  Note:   Objective:  Last practice recorded BP readings:  BP Readings from Last 3 Encounters:  07/15/20 120/78  03/19/20 (!) 146/112  12/19/19 (!) 142/80   Most recent eGFR/CrCl: No results found for: EGFR  No components found for: CRCL Current Barriers:  Knowledge Deficits related to basic understanding of hypertension pathophysiology and self care management Knowledge Deficits related to understanding of medications prescribed for management of hypertension Difficulty obtaining medications Financial Constraints.  Unable to independently self manage HTN Does not adhere to prescribed medication regimen Pt states that her B/P goes up and down.  States she is currently not taking any B/P medications because her B/P was too low.  States  she has not been checking her B/P at home recently but she does have a B/P monitor.  States she tries to follow a low salt diet but they do eat take out frequently Case Manager Clinical Goal(s):  patient will verbalize understanding of plan for hypertension management patient will attend all scheduled medical appointments: CCM pharmacist 11/26/20, [facility] neurology 02/13/21 patient will demonstrate improved health management independence as evidenced by checking blood pressure as directed and notifying PCP if SBP>160 or DBP > 90, taking all medications as prescribe, and adhering to a low sodium diet as discussed. Interventions:  Collaboration with Laurey Morale, MD regarding development and update of comprehensive plan of care as evidenced by provider attestation and co-signature Inter-disciplinary care team collaboration (see longitudinal plan of care) Evaluation of current treatment plan related to hypertension self management and patient's adherence to plan as established by provider. Provided education  to patient re: stroke prevention, s/s of heart attack and stroke, DASH diet, complications of uncontrolled blood pressure Reviewed medications with patient and discussed importance of compliance Discussed plans with patient for ongoing care management follow up and provided patient with direct contact information for care management team Advised patient, providing education and rationale, to monitor blood pressure daily and record, calling PCP for findings outside established parameters.  Reviewed scheduled/upcoming provider appointments including: CCM pharmacist 11/26/20, [facility] [facility] 02/13/21 Self-Care Activities: - Self administers medications as prescribed Attends all scheduled provider appointments Calls provider office for new concerns, questions, or BP outside discussed parameters Checks BP and records as discussed Follows a low sodium diet/DASH diet Patient Goals: - check blood pressure 3  times per week - choose a place to take my blood pressure (home, clinic or office, retail store) - write blood pressure results in a log or diary - ask questions to understand  Follow Up Plan: Telephone follow up appointment with care management team member scheduled for: 12/23/20 at 1 PM  The patient has been provided with contact information for the care management team and has been advised to call with any health related questions or concerns.      Patient Care Plan: GERD     Problem Identified: Health Promotion or Disease Self-Management of GERD   Priority: Medium     Long-Range Goal: Self-Management Plan Developed for GERD   Start Date: 11/22/2020  Expected End Date: 04/17/2021  This Visit's Progress: On track  Priority: Medium  Note:   Current Barriers:  Ineffective Self Health Maintenance of GERD Unable to independently self manage GERD Unable to self administer medications as prescribed States she has had heartburn and GI problems for a long time due to her MS.  STates she is followed by GI at [facility] and she is to have a scope on 11/28/20.  States her medications for her stomach are expensive since she needs to take liquid medications or crush her pills due to her swallowing difficulties from her MS  Clinical Goal(s):  Collaboration with Laurey Morale, MD regarding development and update of comprehensive plan of care as evidenced by provider attestation and co-signature Inter-disciplinary care team collaboration (see longitudinal plan of care) patient will work with care management team to address care coordination and chronic disease management needs related to Disease Management Educational Needs Care Coordination Medication Assistance  Psychosocial Support   Interventions:  Evaluation of current treatment plan related to  GERD , Financial constraints related to cost of medications, Limited access to food, Medication procurement, and ADL IADL limitations  self-management and patient's adherence to plan as established by provider. Collaboration with Laurey Morale, MD regarding development and update of comprehensive plan of care as evidenced by provider attestation       and co-signature Inter-disciplinary care team collaboration (see longitudinal plan of care) Discussed plans with patient for ongoing care management follow up and provided patient with direct contact information for care management team Reviewed importance of taking medications as ordered Encouraged to call [facility] to discuss getting her medications that need to be liquid approved due to her swallowing issues with MS Referred to LCSW for stress and difficulty coping with MS and financial strain Scheduled for visit with CCM pharmacist on 11/26/20 with concerns with cost of medications Care guide has been referred for food resources and housing repairs Self Care Activities:  Self administers medications as prescribed Attends all scheduled provider appointments Calls  pharmacy for medication refills Performs ADL's independently Calls provider office for new concerns or questions Patient Goals: -keep appointment for endoscopy on 11/28/20 -Call [facility] concerning benefits Follow Up Plan: Telephone follow up appointment with care management team member scheduled for: 12/23/20 The patient has been provided with contact information for the care management team and has been advised to call with any health related questions or concerns.      Patient Care Plan: LCSW Plan of Care.     Problem Identified: Reduce and Manage Symptoms of Depression and Anxiety.   Priority: High     Goal: Reduce and Manage Symptoms of Depression and Anxiety.   Start Date: 12/12/2020  Expected End Date: 02/12/2021  This Visit's Progress: On track  Priority: High  Note:   Current Barriers:   Acute Mental Health needs related to Anxiety, Stress, Chronic Pain, Depressive Illness,  Multiple Sclerosis and Hypertension. Lacks Knowledge of [employer]. Needs Support, Education, and Care Coordination in order to meet unmet mental health needs. Chronic Pain, Anxiety, Stress and Depressive Illness only exacerbate symptoms of Multiple Sclerosis. Clinical Goal(s):  Over the next 60 days, patient will work with LCSW to reduce and manage symptoms of Anxiety, Stress, Chronic Pain and Depressive Illness.   Patient will increase knowledge and/or ability of:        Coping Skills, Healthy Habits, Self-Management Skills, Stress Reduction, Home Safety and Utilizing [employer] and Resources. Clinical Interventions:  Assessed patient's previous treatment, needs, coping skills, current treatment, support system and barriers to care. PHQ-2 and PHQ-9 Depression Screening Tool performed and results reviewed with patient. Other interventions included:       Solution-Focused Therapy Performed, Mindfulness Meditation Strategies, Relaxation Techniques and Deep Breathing Exercises Encouraged, Active Listening/       Reflection Utilized, Emotional Support Provided, Problem Solving [place], Psychoeducation /Health Education, Motivational        Interviewing, Brief Cognitive Behavioral Therapy Initiated, Reviewed Mental Health Medications and Discussed Compliance, Quality of Sleep Assessed and       Sleep Hygiene Techniques Promoted, Support Group Participation Encouraged, Increase Level of Activity/Exercise, Verbalization of Feelings Encouraged,        Crisis Resource Education/Information Provided, Suicidal Ideation/Homicidal Ideation Assessed - None Present.   Patient interviewed and appropriate assessments performed. Provided mental health counseling with regards to Anxiety, Stress, Chronic Pain and Depressive Illness.     Discussed plans with patient for ongoing care management follow up and provided patient with direct contact information for care management  team. Discussed several options for long-term counseling based on need and insurance.  Collaboration with Primary Care Physician, Dr. Alysia Penna regarding development and update of comprehensive plan of care as evidenced by provider attestation and co-signature. Inter-disciplinary care team collaboration (see longitudinal plan of care). Discussion of referral to [place] for ongoing mental health counseling and supportive services. Patient Goals/Self-Care Activities: Avoid negative self-talk and practice positive thinking and self-talk. Exercise at least 2 to 3 times per week, or as tolerated. Journal feelings and what helps to feel better or worse. Spend time with friends and engage in fun activities, at least 2 to 3 times per week. Watch for early signs of feeling worse. Begin personal counseling with LCSW on a weekly basis, to reduce and manage symptoms. Consider self-enrollment in a support group.   Practice relaxation techniques, deep breathing exercises and mindfulness meditation strategies, daily. Talk about feelings with a friend, family member, or spiritual advisor. Continue with compliance of taking  prescription medications. Try to obtain adequate rest, stay well-hydrated and eat a healthy, well-balanced diet. Referral placed to [facility] for ongoing mental health counseling and supportive services. Follow-Up:  12/20/2020 at 3:00pm.    Problem Identified: Find Help in My Community.   Priority: High     Goal: My Help in My Community.   Start Date: 12/12/2020  Expected End Date: 02/12/2021  This Visit's Progress: On track  Priority: Medium  Note:   Current Barriers:   Financial constraints related to only receiving Social Security Disability Income. Limited social support. Food insecurity.  Housing barriers, due to inability to afford monthly rent payments.   Inability to afford prescription medications. Mental health concerns related to stress and anxiety. Lacks knowledge  of community resources. Clinical Goals:  Over the next 90 days, patient will work with LCSW to address needs related to housing, finances, food insecurity, medications, stress and anxiety. Clinical Interventions:  Collaboration with Primary Care Physician, Dr. Alysia Penna regarding development and update of comprehensive plan of care as evidenced by provider attestation and co-signature. Inter-disciplinary care team collaboration (see longitudinal plan of care). Assessment of needs, barriers, agencies contacted, as well as how impacting.  Reviewed various financial resources, discussed options and provided patient with information and how to apply for Adult Medicaid, through the Department of Social Services. Reviewed various emergency assistance resources, discussed options and provided patient with information about how to apply for emergency financial assistance. Reviewed various housing resources, discussed options and provided patient with information about how to apply for Section 8 Housing, [place], Hydrologist and Engineer, building services and [employer]. Reviewed various mental health resources, discussed options and provided patient with information about [facility]. Patient interviewed and appropriate assessments performed. Provided mental health counseling with regard to stress and anxiety.   Discussed plans with patient for ongoing care management follow-up and provided patient with direct contact information for care management team. Advised patient to begin contacting resources provided. Clinical interventions provided:  PHQ 2 and PHQ 9 Depression Screen completed and results reviewed, Solution-Focused Strategies implemented, Deep Breathing Exercises, Relaxation Techniques and Mindfulness Meditation Strategies Encouraged, Active Listening/Reflection utilized, Emotional Support provided, Behavioral Activation implemented, Problem Solving/Task-Centered Solutions established,  Psychoeducation for mental health needs, Brief Cognitive Behavioral Therapy performed, Reviewed mental health medications with patient and discussed compliance, Quality of Sleep Assessed and Sleep Hygiene Techniques promoted, Participation in counseling encouraged, Participation in support group encouraged, Increase in actives/exercise encouraged, Verbalization of feelings encouraged, Suicidal Ideation/Homicidal Ideation assessed - None present. Discussed referral to [facility] to assist with ongoing mental health counseling and supportive services.   Referral to pharmacy for medication assistance. Patient Goals/Self-Care Activities:  Review list of resources mailed to your home, which include all of the following:   [place] in [place] and [place] Programs Emergency Assistance Programs The [place] in [place] and [employer] in [place] [employer] to ensure eligibility. Complete application for Adult Medicaid and submit to the Department of Social Services for processing. Call [place] [phone]) to periodically check the status of your Adult Medicaid application. Complete Section 8 Housing application and submit to the [employer] for processing. Call the [employer] to periodically check the status of your Section 8 Housing application.   Contact list of [place] resources in  [place]. Dealer. Contact list of Affordable Apartments and Rental Properties. Obtain financial assistance from [facility] and [employer]. Review The [place] in [place]. Review The [place] in [place]. Continue with compliance of taking medications exactly as prescribed.  Referral placed to [facility] for long-term mental health counseling and supportive services. Work with [facility] on a weekly basis to obtain housing, food, financial assistance, prescription medications, and mental health counseling and resources. Follow-Up Date:  12/20/2020 at 3:00pm.

## 2020-12-12 NOTE — Chronic Care Management (AMB) (Signed)
Chronic Care Management    Clinical Social Work Note  12/12/2020 Name: [name] MRN: [id number] DOB: [birth year]  [name] is a 55 y.o. year old female who is a primary care patient of Laurey Morale, MD. The CCM team was consulted to assist the patient with chronic disease management and/or care coordination needs related to: [employer], Landscape architect, Mental Health Counseling and Resources, and Financial Difficulties Related to Medications, Rent and Utilities.  Engaged with patient by telephone for initial visit in response to provider referral for social work chronic care management and care coordination services.   Consent to Services:  The patient was given information about Chronic Care Management services, agreed to services, and gave verbal consent prior to initiation of services.  Please see initial visit note for detailed documentation.   Patient agreed to services and consent obtained.   Assessment: Review of patient past medical history, allergies, medications, and health status, including review of relevant consultants reports was performed today as part of a comprehensive evaluation and provision of chronic care management and care coordination services.     SDOH (Social Determinants of Health) assessments and interventions performed:  SDOH Interventions    Flowsheet Row Most Recent Value  SDOH Interventions   Food Insecurity Interventions Assist with SNAP Application, Other (Comment)  [LCSW provided patient with a list of Food Banks, Marketing executive and Soup Kitchens.]  Sales promotion account executive Interventions Development worker, community, Other (Comment)  [LCSW provided financial resources.]  Housing Interventions Other (Comment)  [LCSW will provide patient with housing resources.]  Intimate Partner Violence Interventions Intervention Not Indicated  Physical Activity Interventions Intervention Not Indicated  Stress Interventions Offered [employer], Provide Counseling, Other (Comment)  [LCSW placed a referral for patient to [facility] for ongoing mental health counseling and supportive services.]  Social Connections Interventions Intervention Not Indicated  Transportation Interventions Intervention Not Indicated        Advanced Directives Status: See Care Plan for related entries.  CCM Care Plan  Allergies  Allergen Reactions   Banana Swelling    Lips, tongue and face   Sulfa Antibiotics Diarrhea and Nausea And Vomiting    Syncope episode and was incoherent   Sulfacetamide Sodium Other (See Comments)    Syncope episode and was incoherent   Penicillins Diarrhea, Nausea And Vomiting and Other (See Comments)    Has patient had a PCN reaction causing immediate rash, facial/tongue/throat swelling, SOB or lightheadedness with hypotension: Yes Has patient had a PCN reaction causing severe rash involving mucus membranes or skin necrosis: No Has patient had a PCN reaction that required hospitalization No Has patient had a PCN reaction occurring within the last 10 years: No If all of the above answers are "NO", then may proceed with Cephalosporin use.  Passed out,     Outpatient Encounter Medications as of 12/12/2020  Medication Sig   acetaminophen (TYLENOL) 160 MG/5ML solution Take 320 mg by mouth 2 (two) times daily as needed for mild pain. Reported on 07/21/2015   cholecalciferol (VITAMIN D) 1000 units tablet Take 1,000 Units by mouth daily.   citalopram (CELEXA) 20 MG tablet Take 1 tablet (20 mg total) by mouth daily.   cyanocobalamin (,VITAMIN B-12,) 1000 MCG/ML injection Inject into the muscle. (Patient not taking: Reported on 11/26/2020)   cyclobenzaprine (FLEXERIL) 10 MG tablet Take 10 mg by mouth 2 (two) times daily as needed for muscle spasms.  (Patient not taking: Reported on 11/26/2020)   diphenhydrAMINE (BENADRYL) 12.5 MG/5ML elixir Take  25 mg by mouth 4 (four) times daily as needed for allergies.   famotidine (PEPCID)  40 MG/5ML suspension Take 5 mLs (40 mg total) by mouth 2 (two) times daily.   ferrous sulfate 220 (44 Fe) MG/5ML solution Take by mouth. (Patient not taking: Reported on 11/26/2020)   fluticasone (FLONASE) 50 MCG/ACT nasal spray Place into the nose.   furosemide (LASIX) 40 MG tablet Take 1 tablet (40 mg total) by mouth daily as needed for fluid.   gabapentin (NEURONTIN) 300 MG capsule Take 300 mg by mouth 3 (three) times daily.   hydrOXYzine (ATARAX/VISTARIL) 25 MG tablet TAKE 1 TABLET (25 MG TOTAL) BY MOUTH 3 (THREE) TIMES DAILY AS NEEDED FOR ITCHING.   levETIRAcetam (KEPPRA) 100 MG/ML solution Take 500 mg by mouth 2 (two) times daily.   levocetirizine (XYZAL) 5 MG tablet Take by mouth.   Oxcarbazepine (TRILEPTAL) 300 MG tablet Take 300 mg by mouth 2 (two) times daily.    pantoprazole (PROTONIX) 40 MG tablet Take 1 tablet (40 mg total) by mouth 2 (two) times daily before a meal. (Patient not taking: Reported on 11/26/2020)   propranolol (INDERAL) 20 MG tablet TAKE 1 OR 2 TABLETS THE MORNINGS OF MEDICAL OR DENTAL PROCEDURES   sucralfate (CARAFATE) 1 GM/10ML suspension Take 10 mLs (1 g total) by mouth 3 (three) times daily.   tizanidine (ZANAFLEX) 2 MG capsule Take 2 mg by mouth 2 (two) times daily.   No facility-administered encounter medications on file as of 12/12/2020.    Patient Active Problem List   Diagnosis Date Noted   Low back pain due to bilateral sciatica 02/22/2018   Essential hypertension 05/30/2015   Right-sided chest pain 06/27/2014   Microcytic anemia 06/27/2014   Multiple sclerosis exacerbation ([place]) 06/27/2014   Obesity (BMI 30-39.9) 06/27/2014   B12 deficiency 06/06/2013   Multiple sclerosis ([place]) 08/12/2010   GERD 12/30/2006   PARESTHESIA 12/30/2006    Conditions to be addressed/monitored: HTN, Anxiety, and Depression.  Financial Constraints Related to Medications, Rent and Utilities, Limited Social Support, Limited Access to [employer], Housing Barriers, Mental Health  Concerns, Limited Access to Caregiver, Memory Deficits, and Lacks Knowledge of [employer].  Care Plan : LCSW Plan of Care.  Updates made by Francis Gaines, LCSW since 12/12/2020 12:00 AM     Problem: Reduce and Manage Symptoms of Depression and Anxiety.   Priority: High     Goal: Reduce and Manage Symptoms of Depression and Anxiety.   Start Date: 12/12/2020  Expected End Date: 02/12/2021  This Visit's Progress: On track  Priority: High  Note:   Current Barriers:   Acute Mental Health needs related to Anxiety, Stress, Chronic Pain, Depressive Illness, Multiple Sclerosis and Hypertension. Lacks Knowledge of [employer]. Needs Support, Education, and Care Coordination in order to meet unmet mental health needs. Chronic Pain, Anxiety, Stress and Depressive Illness only exacerbate symptoms of Multiple Sclerosis. Clinical Goal(s):  Over the next 60 days, patient will work with LCSW to reduce and manage symptoms of Anxiety, Stress, Chronic Pain and Depressive Illness.   Patient will increase knowledge and/or ability of:        Coping Skills, Healthy Habits, Self-Management Skills, Stress Reduction, Home Safety and Utilizing [employer] and Resources. Clinical Interventions:  Assessed patient's previous treatment, needs, coping skills, current treatment, support system and barriers to care. PHQ-2 and PHQ-9 Depression Screening Tool performed and results reviewed with patient. Other interventions included:       Solution-Focused Therapy Performed, Mindfulness Meditation Strategies,  Relaxation Techniques and Deep Breathing Exercises Encouraged, Active Listening/       Reflection Utilized, Emotional Support Provided, Problem Solving /Task Center Solutions Developed, Psychoeducation /Health Education, Motivational        Interviewing, Brief Cognitive Behavioral Therapy Initiated, Reviewed Mental Health Medications and Discussed Compliance, Quality of Sleep Assessed and        Sleep Hygiene Techniques Promoted, Support Group Participation Encouraged, Increase Level of Activity/Exercise, Verbalization of Feelings Encouraged,        Crisis Resource Education/Information Provided, Suicidal Ideation/Homicidal Ideation Assessed - None Present.   Patient interviewed and appropriate assessments performed. Provided mental health counseling with regards to Anxiety, Stress, Chronic Pain and Depressive Illness.     Discussed plans with patient for ongoing care management follow up and provided patient with direct contact information for care management team. Discussed several options for long-term counseling based on need and insurance.  Collaboration with Primary Care Physician, Dr. Alysia Penna regarding development and update of comprehensive plan of care as evidenced by provider attestation and co-signature. Inter-disciplinary care team collaboration (see longitudinal plan of care). Discussion of referral to [facility] for ongoing mental health counseling and supportive services. Patient Goals/Self-Care Activities: Avoid negative self-talk and practice positive thinking and self-talk. Exercise at least 2 to 3 times per week, or as tolerated. Journal feelings and what helps to feel better or worse. Spend time with friends and engage in fun activities, at least 2 to 3 times per week. Watch for early signs of feeling worse. Begin personal counseling with LCSW on a weekly basis, to reduce and manage symptoms. Consider self-enrollment in a support group.   Practice relaxation techniques, deep breathing exercises and mindfulness meditation strategies, daily. Talk about feelings with a friend, family member, or spiritual advisor. Continue with compliance of taking prescription medications. Try to obtain adequate rest, stay well-hydrated and eat a healthy, well-balanced diet. Referral placed to [facility] for ongoing mental health counseling and supportive services. Follow-Up:   12/20/2020 at 3:00pm.    Problem: Find Help in My Community.   Priority: High     Goal: My Help in My Community.   Start Date: 12/12/2020  Expected End Date: 02/12/2021  This Visit's Progress: On track  Priority: Medium  Note:   Current Barriers:   Financial constraints related to only receiving Social Security Disability Income. Limited social support. Food insecurity.  Housing barriers, due to inability to afford monthly rent payments.   Inability to afford prescription medications. Mental health concerns related to stress and anxiety. Lacks knowledge of community resources. Clinical Goals:  Over the next 90 days, patient will work with LCSW to address needs related to housing, finances, food insecurity, medications, stress and anxiety. Clinical Interventions:  Collaboration with Primary Care Physician, Dr. Alysia Penna regarding development and update of comprehensive plan of care as evidenced by provider attestation and co-signature. Inter-disciplinary care team collaboration (see longitudinal plan of care). Assessment of needs, barriers, agencies contacted, as well as how impacting.  Reviewed various financial resources, discussed options and provided patient with information and how to apply for Adult Medicaid, through the Department of Social Services. Reviewed various emergency assistance resources, discussed options and provided patient with information about how to apply for emergency financial assistance. Reviewed various housing resources, discussed options and provided patient with information about how to apply for Section 8 Housing, [place], Hydrologist and Engineer, building services and [employer]. Reviewed various mental health resources, discussed options and provided patient with information about [facility]. Patient interviewed  and appropriate assessments performed. Provided mental health counseling with regard to stress and anxiety.   Discussed plans  with patient for ongoing care management follow-up and provided patient with direct contact information for care management team. Advised patient to begin contacting resources provided. Clinical interventions provided:  PHQ 2 and PHQ 9 Depression Screen completed and results reviewed, Solution-Focused Strategies implemented, Deep Breathing Exercises, Relaxation Techniques and Mindfulness Meditation Strategies Encouraged, Active Listening/Reflection utilized, Emotional Support provided, Behavioral Activation implemented, Problem Solving/Task-Centered Solutions established, Psychoeducation for mental health needs, Brief Cognitive Behavioral Therapy performed, Reviewed mental health medications with patient and discussed compliance, Quality of Sleep Assessed and Sleep Hygiene Techniques promoted, Participation in counseling encouraged, Participation in support group encouraged, Increase in actives/exercise encouraged, Verbalization of feelings encouraged, Suicidal Ideation/Homicidal Ideation assessed - None present. Discussed referral to [facility] to assist with ongoing mental health counseling and supportive services.   Referral to pharmacy for medication assistance. Patient Goals/Self-Care Activities:  Review list of resources mailed to your home, which include all of the following:   [place] in [place] and [place] Programs Emergency Assistance Programs [employer] [place] in [place] and [employer] in [place] [employer] to ensure eligibility. Complete application for Adult Medicaid and submit to the Department of Social Services for processing. Call [place] [phone]) to periodically check the status of your Adult Medicaid  application. Complete Section 8 Housing application and submit to the [employer] for processing. Call the [employer] to periodically check the status of your Section 8 Housing application.   Contact list of [place] resources in [place]. Dealer. Contact list of Affordable Apartments and Rental Properties. Obtain financial assistance from [facility] and [employer]. Review The [place] in [place]. Review The [place] in [place]. Continue with compliance of taking medications exactly as prescribed.  Referral placed to [facility] for long-term mental health counseling and supportive services. Work with [facility] on a weekly basis to obtain housing, food, financial assistance, prescription medications, and mental health counseling and resources. Follow-Up Date:  12/20/2020 at 3:00pm.      Follow-Up Plan:  LCSW will follow-up with patient by phone on 12/20/2020 at 3:00pm.      Nat Christen LCSW Licensed Clinical Social Worker [place] [phone]

## 2020-12-20 ENCOUNTER — Ambulatory Visit (INDEPENDENT_AMBULATORY_CARE_PROVIDER_SITE_OTHER): Payer: PPO | Admitting: *Deleted

## 2020-12-20 DIAGNOSIS — G35 Multiple sclerosis: Secondary | ICD-10-CM

## 2020-12-20 DIAGNOSIS — I10 Essential (primary) hypertension: Secondary | ICD-10-CM

## 2020-12-20 DIAGNOSIS — Z5941 Food insecurity: Secondary | ICD-10-CM

## 2020-12-20 DIAGNOSIS — E669 Obesity, unspecified: Secondary | ICD-10-CM

## 2020-12-20 NOTE — Patient Instructions (Signed)
Visit Information  PATIENT GOALS:  Goals Addressed               This Visit's Progress     Find Help in My Community. (pt-stated)   On track     Timeframe:  Short-Term Goal Priority:  Medium Start Date:  12/12/2020                          Expected End Date:  02/12/2021                    Follow-Up Date: 01/07/2021 at 9:00am  Patient Goals/Self-Care Activities:  Review Medicaid Tips to ensure eligibility. Complete application for Adult Medicaid and submit to the Department of Social Services for processing. Call [place] [phone]) to periodically check the status of your Adult Medicaid application. Complete Section 8 Housing application and submit to the [employer] for processing. Call the [employer] to periodically check the status of your Section 8 Housing application.   Contact list of [place] resources in [place]. Dealer. Contact list of Affordable Apartments and Rental Properties. Obtain financial assistance from [facility] and [employer]. Review The [place] in [place]. Review The [place] in [place]. Continue with compliance of taking medications exactly as prescribed.  Referral placed to [facility] for long-term mental health counseling and supportive services. Work with [employer] [facility] on a weekly/bi-weekly basis to obtain housing, food, financial assistance, prescription medications, and mental health counseling and resources.      Reduce and Manage Symptoms of Depression and Anxiety. (pt-stated)   On track     Timeframe:  Short-Term Goal Priority:  High Start Date:  12/12/2020                          Expected End Date:  02/12/2021                     Follow-Up Date:  01/07/2021 at 9:00am  Patient Goals/Self-Care Activities: Begin personal  counseling with LCSW on a weekly/bi-weekly basis, to reduce and manage symptoms, until established with [facility]. Verbal consent obtained to place referral to [facility] for ongoing mental health counseling and supportive services. Accept all calls from [facility] to establish services, as well as to schedule the initial counseling session. Consider self-enrollment in a support group.   Practice relaxation techniques, deep breathing exercises and mindfulness meditation strategies, daily. Continue with compliance of taking prescription medications.          Patient verbalizes understanding of instructions provided today and agrees to view in [place].   Telephone follow-up appointment with care management team member scheduled for:  01/07/2021 at [place] LCSW Licensed Clinical Social Worker [place] [phone]

## 2020-12-20 NOTE — Chronic Care Management (AMB) (Signed)
Chronic Care Management    Clinical Social Work Note  12/20/2020 Name: LAYLANI PLANTZ MRN: [id number] DOB: August 22, 1965  MARLAINA MATURINO is a 55 y.o. year old female who is a primary care patient of Laurey Morale, MD. The CCM team was consulted to assist the patient with chronic disease management and/or care coordination needs related to: Transportation Needs, [employer], Landscape architect, Mental Health Counseling and Resources, and Financial Difficulties.   Engaged with patient by telephone for follow-up visit in response to provider referral for social work chronic care management and care coordination services.   Consent to Services:  The patient was given information about Chronic Care Management services, agreed to services, and gave verbal consent prior to initiation of services.  Please see initial visit note for detailed documentation.   Patient agreed to services and consent obtained.   Assessment: Review of patient past medical history, allergies, medications, and health status, including review of relevant consultants reports was performed today as part of a comprehensive evaluation and provision of chronic care management and care coordination services.     SDOH (Social Determinants of Health) assessments and interventions performed:    Advanced Directives Status: Not addressed in this encounter.  CCM Care Plan  Allergies  Allergen Reactions   Banana Swelling    Lips, tongue and face   Sulfa Antibiotics Diarrhea and Nausea And Vomiting    Syncope episode and was incoherent   Sulfacetamide Sodium Other (See Comments)    Syncope episode and was incoherent   Penicillins Diarrhea, Nausea And Vomiting and Other (See Comments)    Has patient had a PCN reaction causing immediate rash, facial/tongue/throat swelling, SOB or lightheadedness with hypotension: Yes Has patient had a PCN reaction causing severe rash involving mucus membranes or skin necrosis: No Has  patient had a PCN reaction that required hospitalization No Has patient had a PCN reaction occurring within the last 10 years: No If all of the above answers are "NO", then may proceed with Cephalosporin use.  Passed out,     Outpatient Encounter Medications as of 12/20/2020  Medication Sig   acetaminophen (TYLENOL) 160 MG/5ML solution Take 320 mg by mouth 2 (two) times daily as needed for mild pain. Reported on 07/21/2015   cholecalciferol (VITAMIN D) 1000 units tablet Take 1,000 Units by mouth daily.   citalopram (CELEXA) 20 MG tablet Take 1 tablet (20 mg total) by mouth daily.   cyanocobalamin (,VITAMIN B-12,) 1000 MCG/ML injection Inject into the muscle. (Patient not taking: Reported on 11/26/2020)   cyclobenzaprine (FLEXERIL) 10 MG tablet Take 10 mg by mouth 2 (two) times daily as needed for muscle spasms.  (Patient not taking: Reported on 11/26/2020)   diphenhydrAMINE (BENADRYL) 12.5 MG/5ML elixir Take 25 mg by mouth 4 (four) times daily as needed for allergies.   famotidine (PEPCID) 40 MG/5ML suspension Take 5 mLs (40 mg total) by mouth 2 (two) times daily.   ferrous sulfate 220 (44 Fe) MG/5ML solution Take by mouth. (Patient not taking: Reported on 11/26/2020)   fluticasone (FLONASE) 50 MCG/ACT nasal spray Place into the nose.   furosemide (LASIX) 40 MG tablet Take 1 tablet (40 mg total) by mouth daily as needed for fluid.   gabapentin (NEURONTIN) 300 MG capsule Take 300 mg by mouth 3 (three) times daily.   hydrOXYzine (ATARAX/VISTARIL) 25 MG tablet TAKE 1 TABLET (25 MG TOTAL) BY MOUTH 3 (THREE) TIMES DAILY AS NEEDED FOR ITCHING.   levETIRAcetam (KEPPRA) 100 MG/ML solution Take 500 mg  by mouth 2 (two) times daily.   levocetirizine (XYZAL) 5 MG tablet Take by mouth.   Oxcarbazepine (TRILEPTAL) 300 MG tablet Take 300 mg by mouth 2 (two) times daily.    pantoprazole (PROTONIX) 40 MG tablet Take 1 tablet (40 mg total) by mouth 2 (two) times daily before a meal. (Patient not taking: Reported on  11/26/2020)   propranolol (INDERAL) 20 MG tablet TAKE 1 OR 2 TABLETS THE MORNINGS OF MEDICAL OR DENTAL PROCEDURES   sucralfate (CARAFATE) 1 GM/10ML suspension Take 10 mLs (1 g total) by mouth 3 (three) times daily.   tizanidine (ZANAFLEX) 2 MG capsule Take 2 mg by mouth 2 (two) times daily.   No facility-administered encounter medications on file as of 12/20/2020.    Patient Active Problem List   Diagnosis Date Noted   Low back pain due to bilateral sciatica 02/22/2018   Essential hypertension 05/30/2015   Right-sided chest pain 06/27/2014   Microcytic anemia 06/27/2014   Multiple sclerosis exacerbation ([place]) 06/27/2014   Obesity (BMI 30-39.9) 06/27/2014   B12 deficiency 06/06/2013   Multiple sclerosis ([place]) 08/12/2010   GERD 12/30/2006   PARESTHESIA 12/30/2006    Conditions to be addressed/monitored: Anxiety and Depression.  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  Care Plan : LCSW Plan of Care.  Updates made by Francis Gaines, LCSW since 12/20/2020 12:00 AM     Problem: Reduce and Manage Symptoms of Depression and Anxiety.   Priority: High     Goal: Reduce and Manage Symptoms of Depression and Anxiety.   Start Date: 12/12/2020  Expected End Date: 02/12/2021  This Visit's Progress: On track  Recent Progress: On track  Priority: High  Note:   Current Barriers:   Acute Mental Health needs related to Anxiety, Stress, Chronic Pain, Depressive Illness, Multiple Sclerosis and Hypertension. Lacks Knowledge of [employer]. Needs Support, Education, and Care Coordination in order to meet unmet mental health needs. Chronic Pain, Anxiety, Stress and Depressive Illness only exacerbate symptoms of Multiple Sclerosis. Clinical Goal(s):  Over the next 60 days, patient will work with LCSW to reduce and manage symptoms of Anxiety, Stress, Chronic Pain and Depressive Illness.    Patient will increase knowledge and/or ability of:        Coping Skills, Healthy Habits, Self-Management Skills, Stress Reduction, Home Safety and Utilizing [employer] and Resources. Clinical Interventions:  Assessed patient's previous treatment, needs, coping skills, current treatment, support system and barriers to care. PHQ-2 and PHQ-9 Depression Screening Tool performed and results reviewed with patient. Other interventions included:       Solution-Focused Therapy Performed, Mindfulness Meditation Strategies, Relaxation Techniques and Deep Breathing Exercises Encouraged, Active Listening/       Reflection Utilized, Emotional Support Provided, Problem Solving [place], Psychoeducation /Health Education, Motivational        Interviewing, Brief Cognitive Behavioral Therapy Initiated, Reviewed Mental Health Medications and Discussed Compliance, Quality of Sleep Assessed and       Sleep Hygiene Techniques Promoted, Support Group Participation Encouraged, Increase Level of Activity/Exercise, Verbalization of Feelings Encouraged,        Crisis Resource Education/Information Provided, Suicidal Ideation/Homicidal Ideation Assessed - None Present.   Patient interviewed and appropriate assessments performed. Provided mental health counseling with regards to Anxiety, Stress, Chronic Pain and Depressive Illness.     Discussed plans with patient for ongoing care management follow up and provided patient with direct contact information for care  management team. Discussed several options for long-term counseling based on need and insurance.  Collaboration with Primary Care Physician, Dr. Alysia Penna regarding development and update of comprehensive plan of care as evidenced by provider attestation and co-signature. Inter-disciplinary care team collaboration (see longitudinal plan of care). Discussion of referral to [facility] for ongoing mental health counseling and supportive  services. Patient Goals/Self-Care Activities: Begin personal counseling with LCSW on a weekly/bi-weekly basis, to reduce and manage symptoms, until established with [facility]. Verbal consent obtained to place referral to [facility] for ongoing mental health counseling and supportive services. Accept all calls from [facility] to establish services, as well as to schedule the initial counseling session. Consider self-enrollment in a support group.   Practice relaxation techniques, deep breathing exercises and mindfulness meditation strategies, daily. Continue with compliance of taking prescription medications. Follow-Up:  01/07/2021 at 9:00am    Problem: Find Help in My Community.   Priority: High     Goal: My Help in My Community.   Start Date: 12/12/2020  Expected End Date: 02/12/2021  This Visit's Progress: On track  Recent Progress: On track  Priority: Medium  Note:   Current Barriers:   Financial constraints related to only receiving Social Security Disability Income. Limited social support. Food insecurity.  Housing barriers, due to inability to afford monthly rent payments.   Inability to afford prescription medications. Mental health concerns related to stress and anxiety. Lacks knowledge of community resources. Clinical Goals:  Over the next 90 days, patient will work with LCSW to address needs related to housing, finances, food insecurity, medications, stress and anxiety. Clinical Interventions:  Collaboration with Primary Care Physician, Dr. Alysia Penna regarding development and update of comprehensive plan of care as evidenced by provider attestation and co-signature. Inter-disciplinary care team collaboration (see longitudinal plan of care). Assessment of needs, barriers, agencies contacted, as well as how impacting.  Reviewed various financial resources, discussed options and provided patient with information and how to apply for Adult Medicaid, through the  Department of Social Services. Reviewed various emergency assistance resources, discussed options and provided patient with information about how to apply for emergency financial assistance. Reviewed various housing resources, discussed options and provided patient with information about how to apply for Section 8 Housing, [place], Hydrologist and Engineer, building services and [employer]. Reviewed various mental health resources, discussed options and provided patient with information about [facility]. Patient interviewed and appropriate assessments performed. Provided mental health counseling with regard to stress and anxiety.   Discussed plans with patient for ongoing care management follow-up and provided patient with direct contact information for care management team. Advised patient to begin contacting resources provided. Clinical interventions provided:  PHQ 2 and PHQ 9 Depression Screen completed and results reviewed, Solution-Focused Strategies implemented, Deep Breathing Exercises, Relaxation Techniques and Mindfulness Meditation Strategies Encouraged, Active Listening/Reflection utilized, Emotional Support provided, Behavioral Activation implemented, Problem Solving/Task-Centered Solutions established, Psychoeducation for mental health needs, Brief Cognitive Behavioral Therapy performed, Reviewed mental health medications with patient and discussed compliance, Quality of Sleep Assessed and Sleep Hygiene Techniques promoted, Participation in counseling encouraged, Participation in support group encouraged, Increase in actives/exercise encouraged, Verbalization of feelings encouraged, Suicidal Ideation/Homicidal Ideation assessed - None present. Discussed referral to [facility] to assist with ongoing mental health counseling and supportive services.   Referral to pharmacy for medication assistance. Patient Goals/Self-Care Activities:  Review Medicaid Tips to ensure  eligibility. Complete application for Adult Medicaid and submit to the Department of Social Services for processing. Call Department  of Social Services [phone]) to periodically check the status of your Adult Medicaid application. Complete Section 8 Housing application and submit to the [employer] for processing. Call the [employer] to periodically check the status of your Section 8 Housing application.   Contact list of [place] resources in [place]. Dealer. Contact list of Affordable Apartments and Rental Properties. Obtain financial assistance from [facility] and [employer]. Review [facility] in [place]. Review [facility] in [place]. Continue with compliance of taking medications exactly as prescribed.  Referral placed to [facility] for long-term mental health counseling and supportive services. Work with [facility] on a weekly/bi-weekly basis to obtain housing, food, financial assistance, prescription medications, and mental health counseling and resources. Follow-Up Date:  01/07/2021 at [place]:  01/07/2021 at [place] LCSW Licensed Clinical Social Worker [place] [phone]

## 2020-12-23 ENCOUNTER — Ambulatory Visit: Payer: PPO

## 2020-12-23 ENCOUNTER — Telehealth: Payer: Self-pay

## 2020-12-23 DIAGNOSIS — G35 Multiple sclerosis: Secondary | ICD-10-CM

## 2020-12-23 DIAGNOSIS — I10 Essential (primary) hypertension: Secondary | ICD-10-CM

## 2020-12-23 DIAGNOSIS — K2101 Gastro-esophageal reflux disease with esophagitis, with bleeding: Secondary | ICD-10-CM

## 2020-12-23 NOTE — Telephone Encounter (Signed)
[name] from [facility] called in regards to questions for pt medication would like a call back [phone]

## 2020-12-23 NOTE — Patient Instructions (Signed)
Visit Information  PATIENT GOALS:  Goals Addressed             This Visit's Progress    RNCM:Keep Myself Safe/Fall prevention-Multiple Sclerosis   On track    Timeframe:  Long-Range Goal Priority:  High Start Date:       11/22/20                      Expected End Date:      04/17/21                 Follow Up Date 02/14/21    - add more outdoor lighting - always use handrails on the stairs - always wear shoes or slippers with non-slip sole - install bathroom grab bars - keep a flashlight by the bed - keep cell phone with me always - make an emergency alert plan in case I fall - pick up clutter from the floors - use a cane or walker - use a nightlight in the bathroom, halls - use a nonslip pad with throw rugs, or remove them completely - always wear low-heeled or flat shoes or slippers with nonskid soles    Why is this important?     Notes:      RNCM:Manage Fatigue (Tiredness-Multiple Sclerosis)   On track    Timeframe:  Long-Range Goal Priority:  Medium Start Date:    11/22/20                         Expected End Date:     04/17/21                  Follow Up Date 02/14/21    - eat healthy - get at least 8 hours of sleep at night - keep the bedroom cool and dark - maintain a healthy weight - stop using electronic devices like cell phone or video game when getting ready for sleep - use a fan or white noise in bedroom - use cooling vest - use devices that will help like a cane, sock-puller or reacher - use meditation or relaxation techniques    Why is this important?   MS (multiple sclerosis) can drain your loved one's/your energy. It can keep from doing things your loved one/you would like to do.  There are many things that your loved one/you can do to manage fatigue.    Notes:      RNCM:Track and Manage My Blood Pressure-Hypertension   On track    Timeframe:  Long-Range Goal Priority:  Medium Start Date:     11/22/20                        Expected End Date:    04/17/21                    Follow Up Date 02/14/21    - check blood pressure 3 times per week - choose a place to take my blood pressure (home, clinic or office, retail store) - write blood pressure results in a log or diary    Why is this important?   You won't feel high blood pressure, but it can still hurt your blood vessels.  High blood pressure can cause heart or kidney problems. It can also cause a stroke.  Making lifestyle changes like losing a little weight or eating less salt will help.  Checking your blood pressure  at home and at different times of the day can help to control blood pressure.  If the doctor prescribes medicine remember to take it the way the doctor ordered.  Call the office if you cannot afford the medicine or if there are questions about it.     Notes:         The patient verbalized understanding of instructions, educational materials, and care plan provided today and declined offer to receive copy of patient instructions, educational materials, and care plan.   Telephone follow up appointment with care management team member scheduled for: 02/14/21 at 2 PM  [place], [facility], CDE Care Management Coordinator [facility] [phone], Mobile [phone]

## 2020-12-23 NOTE — Chronic Care Management (AMB) (Signed)
Chronic Care Management   CCM RN Visit Note  12/23/2020 Name: [name] MRN: [id number] DOB: [birth year]  Subjective: [name] is a 55 y.o. year old female who is a primary care patient of Laurey Morale, MD. The care management team was consulted for assistance with disease management and care coordination needs.    Engaged with patient by telephone for follow up visit in response to provider referral for case management and/or care coordination services.   Consent to Services:  The patient was given information about Chronic Care Management services, agreed to services, and gave verbal consent prior to initiation of services.  Please see initial visit note for detailed documentation.   Patient agreed to services and verbal consent obtained.   Assessment: Review of patient past medical history, allergies, medications, health status, including review of consultants reports, laboratory and other test data, was performed as part of comprehensive evaluation and provision of chronic care management services.   SDOH (Social Determinants of Health) assessments and interventions performed:    CCM Care Plan  Allergies  Allergen Reactions   Banana Swelling    Lips, tongue and face   Sulfa Antibiotics Diarrhea and Nausea And Vomiting    Syncope episode and was incoherent   Sulfacetamide Sodium Other (See Comments)    Syncope episode and was incoherent   Penicillins Diarrhea, Nausea And Vomiting and Other (See Comments)    Has patient had a PCN reaction causing immediate rash, facial/tongue/throat swelling, SOB or lightheadedness with hypotension: Yes Has patient had a PCN reaction causing severe rash involving mucus membranes or skin necrosis: No Has patient had a PCN reaction that required hospitalization No Has patient had a PCN reaction occurring within the last 10 years: No If all of the above answers are "NO", then may proceed with Cephalosporin use.  Passed out,      Outpatient Encounter Medications as of 12/23/2020  Medication Sig   acetaminophen (TYLENOL) 160 MG/5ML solution Take 320 mg by mouth 2 (two) times daily as needed for mild pain. Reported on 07/21/2015   cholecalciferol (VITAMIN D) 1000 units tablet Take 1,000 Units by mouth daily.   citalopram (CELEXA) 20 MG tablet Take 1 tablet (20 mg total) by mouth daily.   cyanocobalamin (,VITAMIN B-12,) 1000 MCG/ML injection Inject into the muscle. (Patient not taking: Reported on 11/26/2020)   cyclobenzaprine (FLEXERIL) 10 MG tablet Take 10 mg by mouth 2 (two) times daily as needed for muscle spasms.  (Patient not taking: Reported on 11/26/2020)   diphenhydrAMINE (BENADRYL) 12.5 MG/5ML elixir Take 25 mg by mouth 4 (four) times daily as needed for allergies.   famotidine (PEPCID) 40 MG/5ML suspension Take 5 mLs (40 mg total) by mouth 2 (two) times daily.   ferrous sulfate 220 (44 Fe) MG/5ML solution Take by mouth. (Patient not taking: Reported on 11/26/2020)   fluticasone (FLONASE) 50 MCG/ACT nasal spray Place into the nose.   furosemide (LASIX) 40 MG tablet Take 1 tablet (40 mg total) by mouth daily as needed for fluid.   gabapentin (NEURONTIN) 300 MG capsule Take 300 mg by mouth 3 (three) times daily.   hydrOXYzine (ATARAX/VISTARIL) 25 MG tablet TAKE 1 TABLET (25 MG TOTAL) BY MOUTH 3 (THREE) TIMES DAILY AS NEEDED FOR ITCHING.   levETIRAcetam (KEPPRA) 100 MG/ML solution Take 500 mg by mouth 2 (two) times daily.   levocetirizine (XYZAL) 5 MG tablet Take by mouth.   Oxcarbazepine (TRILEPTAL) 300 MG tablet Take 300 mg by mouth 2 (two) times  daily.    pantoprazole (PROTONIX) 40 MG tablet Take 1 tablet (40 mg total) by mouth 2 (two) times daily before a meal. (Patient not taking: Reported on 11/26/2020)   propranolol (INDERAL) 20 MG tablet TAKE 1 OR 2 TABLETS THE MORNINGS OF MEDICAL OR DENTAL PROCEDURES   sucralfate (CARAFATE) 1 GM/10ML suspension Take 10 mLs (1 g total) by mouth 3 (three) times daily.    tizanidine (ZANAFLEX) 2 MG capsule Take 2 mg by mouth 2 (two) times daily.   No facility-administered encounter medications on file as of 12/23/2020.    Patient Active Problem List   Diagnosis Date Noted   Low back pain due to bilateral sciatica 02/22/2018   Essential hypertension 05/30/2015   Right-sided chest pain 06/27/2014   Microcytic anemia 06/27/2014   Multiple sclerosis exacerbation ([place]) 06/27/2014   Obesity (BMI 30-39.9) 06/27/2014   B12 deficiency 06/06/2013   Multiple sclerosis ([place]) 08/12/2010   GERD 12/30/2006   PARESTHESIA 12/30/2006    Conditions to be addressed/monitored:HTN and Multiple Sclerosis, GERD  Care Plan : Multiple Sclerosis (Adult)  Updates made by [name], RN since 12/23/2020 12:00 AM     Problem: Lack of self management of Multiple Sclerosis   Priority: High     Long-Range Goal: Effective self management of Multiple Sclerosis   Start Date: 11/22/2020  Expected End Date: 05/18/2021  This Visit's Progress: On track  Recent Progress: On track  Priority: High  Note:   Current Barriers:  Ineffective Self Health Maintenance of Multiple Sclerosis with hx of seizures, HTN, GERD Unable to independently self manage MS Unable to self administer medications as prescribed due to cost Does not adhere to provider recommendations re:  Unable to perform IADLs independently: needs assist with shopping, transportation and sometimes with dressing depending if MS is flaring up Pt states that she was dx with MS in 2008.  States she goes to neurology at [facility] for treatment.  States her MS has progressed to the point where she need to have chemo.  States she has good days and bad days .  States that the hot weather has really been difficult for her this year. States she has difficulty swallowing liquids and her pills need to be crushed or in liquid form.  States her medications are too expensive and she can not afford some of them. States she has been very stressed  with dealing with her MS and financial difficulty since she and her husband are on disability-12/23/20-States she has talked to the Education officer, museum about her stress.  States she still has good days and bad days with her MS Clinical Goal(s):  Collaboration with Laurey Morale, MD regarding development and update of comprehensive plan of care as evidenced by provider attestation and co-signature Inter-disciplinary care team collaboration (see longitudinal plan of care) patient will work with care management team to address care coordination and chronic disease management needs related to Disease Management Educational Needs Care Coordination Medication Assistance  Psychosocial Support   Interventions:  Evaluation of current treatment plan related to HTN and MS, seizures, GERD , Financial constraints related to medications,housing and food, Limited access to food, Medication procurement, and ADL IADL limitations self-management and patient's adherence to plan as established by provider. Collaboration with Laurey Morale, MD regarding development and update of comprehensive plan of care as evidenced by provider attestation       and co-signature Inter-disciplinary care team collaboration (see longitudinal plan of care) Discussed plans with patient for ongoing care  management follow up and provided patient with direct contact information for care management team Reinforce fall prevention and home safety  Reinforced importance of pacing her activity and to avoid activity during the heat of the day Working with LCSW for stress and difficulty coping with MS and financial strain Saw [facility]CM pharmacist on 11/26/20 for concerns with cost of medications Care guide has been referred for food resources and housing repairs-referral completed Self Care Activities:  Self administers medications as prescribed Attends all scheduled provider appointments Calls pharmacy for medication refills Performs ADL's  independently Patient Goals: - eat healthy - get at least 8 hours of sleep at night - keep the bedroom cool and dark - maintain a healthy weight - stop using electronic devices like cell phone or video game when getting ready for sleep - use a fan or white noise in bedroom - use cooling vest - use devices that will help like a cane, sock-puller or reacher - use meditation or relaxation techniques - add more outdoor lighting - always use handrails on the stairs - always wear shoes or slippers with non-slip sole - install bathroom grab bars - keep a flashlight by the bed - keep cell phone with me always - make an emergency alert plan in case I fall - pick up clutter from the floors - use a cane or walker - use a nightlight in the bathroom, halls - use a nonslip pad with throw rugs, or remove them completely - always wear low-heeled or flat shoes or slippers with nonskid soles Follow Up Plan: Telephone follow up appointment with care management team member scheduled for: 02/14/21 at 2 PM The patient has been provided with contact information for the care management team and has been advised to call with any health related questions or concerns.      Care Plan : Hypertension (Adult)  Updates made by [name], RN since 12/23/2020 12:00 AM     Problem: Lack of self managment of Hypertension (Hypertension)   Priority: Medium     Long-Range Goal: Effective self management of Hypertension   Start Date: 11/22/2020  Expected End Date: 04/17/2021  This Visit's Progress: On track  Recent Progress: On track  Priority: Medium  Note:   Objective:  Last practice recorded BP readings:  BP Readings from Last 3 Encounters:  07/15/20 120/78  03/19/20 (!) 146/112  12/19/19 (!) 142/80   Most recent eGFR/CrCl: No results found for: EGFR  No components found for: CRCL Current Barriers:  Knowledge Deficits related to basic understanding of hypertension pathophysiology and self care  management Knowledge Deficits related to understanding of medications prescribed for management of hypertension Difficulty obtaining medications Financial Constraints.  Unable to independently self manage HTN Does not adhere to prescribed medication regimen Pt states that she has not been checking her B/P at home recently. States she tries to follow a low salt diet but they do eat take out frequently Case Manager Clinical Goal(s):  patient will verbalize understanding of plan for hypertension management patient will attend all scheduled medical appointments: GI 02/11/21, [place] neurolog[facility] 02/13/21 patient will demonstrate improved health management independence as evidenced by checking blood pressure as directed and notifying PCP if SBP>160 or DBP > 90, taking all medications as prescribe, and adhering to a low sodium diet as discussed. Interventions:  Collaboration with Laurey Morale, MD regarding development and update of comprehensive plan of care as evidenced by provider attestation and co-signature Inter-disciplinary care team collaboration (see longitudinal plan of  care) Evaluation of current treatment plan related to hypertension self management and patient's adherence to plan as established by provider. Reinforced education on  stroke prevention, s/s of heart attack and stroke, DASH diet, complications of uncontrolled blood pressure Reviewed medications with patient and discussed importance of compliance Discussed plans with patient for ongoing care management follow up and provided patient with direct contact information for care management team Reinforced to monitor blood pressure daily and record, calling PCP for findings outside established parameters.  Reviewed scheduled/upcoming provider appointments including: GI 02/11/21, [facility] neurolog[facility] 02/13/21 Self-Care Activities: - Self administers medications as prescribed Attends all scheduled provider appointments Calls provider office for new  concerns, questions, or BP outside discussed parameters Checks BP and records as discussed Follows a low sodium diet/DASH diet Patient Goals: - check blood pressure 3 times per week - choose a place to take my blood pressure (home, clinic or office, retail store) - write blood pressure results in a log or diary - ask questions to understand  Follow Up Plan: Telephone follow up appointment with care management team member scheduled for: 02/14/21 at 2 PM The patient has been provided with contact information for the care management team and has been advised to call with any health related questions or concerns.      Care Plan : GERD  Updates made by [name], RN since 12/23/2020 12:00 AM     Problem: Health Promotion or Disease Self-Management of GERD   Priority: Medium     Long-Range Goal: Self-Management Plan Developed for GERD   Start Date: 11/22/2020  Expected End Date: 04/17/2021  This Visit's Progress: On track  Recent Progress: On track  Priority: Medium  Note:   Current Barriers:  Ineffective Self Health Maintenance of GERD Unable to independently self manage GERD Unable to self administer medications as prescribed States she has had heartburn and GI problems for a long time due to her MS.  States she is followed by GI at [facility] and she had her  scope on 11/28/20.  States it showed no bleeding and her ulcers are healed.  States she is now taking an over the counter acid capsule that she can open and up in apple sauce. Clinical Goal(s):  Collaboration with Laurey Morale, MD regarding development and update of comprehensive plan of care as evidenced by provider attestation and co-signature Inter-disciplinary care team collaboration (see longitudinal plan of care) patient will work with care management team to address care coordination and chronic disease management needs related to Disease Management Educational Needs Care Coordination Medication  Assistance  Psychosocial Support   Interventions:  Evaluation of current treatment plan related to  GERD , Financial constraints related to cost of medications, Limited access to food, Medication procurement, and ADL IADL limitations self-management and patient's adherence to plan as established by provider. Collaboration with Laurey Morale, MD regarding development and update of comprehensive plan of care as evidenced by provider attestation       and co-signature Inter-disciplinary care team collaboration (see longitudinal plan of care) Discussed plans with patient for ongoing care management follow up and provided patient with direct contact information for care management team Reinforced importance of taking medications as ordered REinforced to call [place] to discuss getting her medications that need to be liquid approved due to her swallowing issues with MS Working with LCSW for stress and difficulty coping with MS and financial strain Saw [facility]CM pharmacist on 11/26/20 with concerns with  cost of medications Care guide has been referred for food resources and housing repairs-referral completed Self Care Activities:  Self administers medications as prescribed Attends all scheduled provider appointments Calls pharmacy for medication refills Performs ADL's independently Calls provider office for new concerns or questions Patient Goals: -keep appointment GI on 02/11/21 -Call Health Team [employer] concerning benefits Follow Up Plan: Telephone follow up appointment with care management team member scheduled for: 02/14/21 at 2 PM The patient has been provided with contact information for the care management team and has been advised to call with any health related questions or concerns.       Plan:Telephone follow up appointment with care management team member scheduled for:  02/14/21 and The patient has been provided with contact information for the care management team and has  been advised to call with any health related questions or concerns.  Peter Garter RN, Jackquline Denmark, CDE Care Management Coordinator [facility] [phone], Mobile [phone]

## 2020-12-24 NOTE — Telephone Encounter (Signed)
Francis,  Kathleen said there are currently 4 open prior authorization for patient.   Attempted to complete over phone, confusion about medications.   Will fax prior authorizations to office for clarification.

## 2020-12-25 NOTE — Telephone Encounter (Signed)
[place], [phone] Option 3, called to followup on PA for 4 medications that were talked about yesterday during the call with nurse. She states they will be expiring soon and needs to resolve this. The ref # for the meds are below:  Ref # [id number] # [id number] # [id number]  Ref # [id number]

## 2020-12-26 NOTE — Telephone Encounter (Signed)
Spoke with both local and [facility] regarding any PA required for pt Medications, stated that they don't have any medications for this pt on file. Attempted  to call pt several times, first cal pt stated that she would call the office back since she was driving and could not talk on her phone, I kept trying to reach pt back with no success. Reach out to the office Pharmacist Maddie who last spoke with pt regarding her medications requiring a lower Tier since pt is in a donut hole with her medication coverage, Maddie stated that she had spoke with [employer] and requested them to fax some forms for her to complete so pt can get help with her medications. Maddie office pharmacist is aware and she is working on pt medications.

## 2021-01-06 ENCOUNTER — Telehealth: Payer: Self-pay | Admitting: Pharmacist

## 2021-01-06 NOTE — Telephone Encounter (Signed)
Applied for and faxed tier exceptions for liquid formulations of medications. Insurance company agent informed me that patient is in the donut hole and likely cost of medications will not be reduced.  Plan to look for goodrx coupon cards to assist in medication cost.

## 2021-01-06 NOTE — Patient Instructions (Signed)
Hi [name],  It was great to get to meet you over the telephone! Below is a summary of some of the topics we discussed.   Please reach out to me if you have any questions or need anything before our follow up!  Best, Maddie  Jeni Salles, PharmD, [facility]   Visit Information   Goals Addressed   None    Patient Care Plan: Multiple Sclerosis (Adult)     Problem Identified: Lack of self management of Multiple Sclerosis   Priority: High     Long-Range Goal: Effective self management of Multiple Sclerosis   Start Date: 11/22/2020  Expected End Date: 05/18/2021  This Visit's Progress: On track  Recent Progress: On track  Priority: High  Note:   Current Barriers:  Ineffective Self Health Maintenance of Multiple Sclerosis with hx of seizures, HTN, GERD Unable to independently self manage MS Unable to self administer medications as prescribed due to cost Does not adhere to provider recommendations re:  Unable to perform IADLs independently: needs assist with shopping, transportation and sometimes with dressing depending if MS is flaring up Pt states that she was dx with MS in 2008.  States she goes to neurology at [facility] for treatment.  States her MS has progressed to the point where she need to have chemo.  States she has good days and bad days .  States that the hot weather has really been difficult for her this year. States she has difficulty swallowing liquids and her pills need to be crushed or in liquid form.  States her medications are too expensive and she can not afford some of them. States she has been very stressed with dealing with her MS and financial difficulty since she and her husband are on disability-12/23/20-States she has talked to the Education officer, museum about her stress.  States she still has good days and bad days with her MS Clinical Goal(s):  Collaboration with Laurey Morale, MD regarding development and  update of comprehensive plan of care as evidenced by provider attestation and co-signature Inter-disciplinary care team collaboration (see longitudinal plan of care) patient will work with care management team to address care coordination and chronic disease management needs related to Disease Management Educational Needs Care Coordination Medication Assistance  Psychosocial Support   Interventions:  Evaluation of current treatment plan related to HTN and MS, seizures, GERD , Financial constraints related to medications,housing and food, Limited access to food, Medication procurement, and ADL IADL limitations self-management and patient's adherence to plan as established by provider. Collaboration with Laurey Morale, MD regarding development and update of comprehensive plan of care as evidenced by provider attestation       and co-signature Inter-disciplinary care team collaboration (see longitudinal plan of care) Discussed plans with patient for ongoing care management follow up and provided patient with direct contact information for care management team Reinforce fall prevention and home safety  Reinforced importance of pacing her activity and to avoid activity during the heat of the day Working with LCSW for stress and difficulty coping with MS and financial strain Saw [facility] pharmacist on 11/26/20 for concerns with cost of medications Care guide has been referred for food resources and housing repairs-referral completed Self Care Activities:  Self administers medications as prescribed Attends all scheduled provider appointments Calls pharmacy for medication refills Performs ADL's independently Patient Goals: - eat healthy - get at least 8 hours of sleep at night - keep the bedroom cool and  dark - maintain a healthy weight - stop using electronic devices like cell phone or video game when getting ready for sleep - use a fan or white noise in bedroom - use cooling vest - use devices  that will help like a cane, sock-puller or reacher - use meditation or relaxation techniques - add more outdoor lighting - always use handrails on the stairs - always wear shoes or slippers with non-slip sole - install bathroom grab bars - keep a flashlight by the bed - keep cell phone with me always - make an emergency alert plan in case I fall - pick up clutter from the floors - use a cane or walker - use a nightlight in the bathroom, halls - use a nonslip pad with throw rugs, or remove them completely - always wear low-heeled or flat shoes or slippers with nonskid soles Follow Up Plan: Telephone follow up appointment with care management team member scheduled for: 02/14/21 at 2 PM The patient has been provided with contact information for the care management team and has been advised to call with any health related questions or concerns.      Patient Care Plan: Hypertension (Adult)     Problem Identified: Lack of self managment of Hypertension (Hypertension)   Priority: Medium     Long-Range Goal: Effective self management of Hypertension   Start Date: 11/22/2020  Expected End Date: 04/17/2021  This Visit's Progress: On track  Recent Progress: On track  Priority: Medium  Note:   Objective:  Last practice recorded BP readings:  BP Readings from Last 3 Encounters:  07/15/20 120/78  03/19/20 (!) 146/112  12/19/19 (!) 142/80   Most recent eGFR/CrCl: No results found for: EGFR  No components found for: CRCL Current Barriers:  Knowledge Deficits related to basic understanding of hypertension pathophysiology and self care management Knowledge Deficits related to understanding of medications prescribed for management of hypertension Difficulty obtaining medications Financial Constraints.  Unable to independently self manage HTN Does not adhere to prescribed medication regimen Pt states that she has not been checking her B/P at home recently. States she tries to follow a low salt  diet but they do eat take out frequently Case Manager Clinical Goal(s):  patient will verbalize understanding of plan for hypertension management patient will attend all scheduled medical appointments: GI 02/11/21, [facility] neurolog[facility] 02/13/21 patient will demonstrate improved health management independence as evidenced by checking blood pressure as directed and notifying PCP if SBP>160 or DBP > 90, taking all medications as prescribe, and adhering to a low sodium diet as discussed. Interventions:  Collaboration with Laurey Morale, MD regarding development and update of comprehensive plan of care as evidenced by provider attestation and co-signature Inter-disciplinary care team collaboration (see longitudinal plan of care) Evaluation of current treatment plan related to hypertension self management and patient's adherence to plan as established by provider. Reinforced education on  stroke prevention, s/s of heart attack and stroke, DASH diet, complications of uncontrolled blood pressure Reviewed medications with patient and discussed importance of compliance Discussed plans with patient for ongoing care management follow up and provided patient with direct contact information for care management team Reinforced to monitor blood pressure daily and record, calling PCP for findings outside established parameters.  Reviewed scheduled/upcoming provider appointments including: GI 02/11/21, [facility] neurology 02/13/21 Self-Care Activities: - Self administers medications as prescribed Attends all scheduled provider appointments Calls provider office for new concerns, questions, or BP outside discussed parameters Checks BP and records as discussed Follows  a low sodium diet/DASH diet Patient Goals: - check blood pressure 3 times per week - choose a place to take my blood pressure (home, clinic or office, retail store) - write blood pressure results in a log or diary - ask questions to understand  Follow Up Plan:  Telephone follow up appointment with care management team member scheduled for: 02/14/21 at 2 PM The patient has been provided with contact information for the care management team and has been advised to call with any health related questions or concerns.      Patient Care Plan: GERD     Problem Identified: Health Promotion or Disease Self-Management of GERD   Priority: Medium     Long-Range Goal: Self-Management Plan Developed for GERD   Start Date: 11/22/2020  Expected End Date: 04/17/2021  This Visit's Progress: On track  Recent Progress: On track  Priority: Medium  Note:   Current Barriers:  Ineffective Self Health Maintenance of GERD Unable to independently self manage GERD Unable to self administer medications as prescribed States she has had heartburn and GI problems for a long time due to her MS.  States she is followed by GI at [facility] and she had her  scope on 11/28/20.  States it showed no bleeding and her ulcers are healed.  States she is now taking an over the counter acid capsule that she can open and up in apple sauce. Clinical Goal(s):  Collaboration with Laurey Morale, MD regarding development and update of comprehensive plan of care as evidenced by provider attestation and co-signature Inter-disciplinary care team collaboration (see longitudinal plan of care) patient will work with care management team to address care coordination and chronic disease management needs related to Disease Management Educational Needs Care Coordination Medication Assistance  Psychosocial Support   Interventions:  Evaluation of current treatment plan related to  GERD , Financial constraints related to cost of medications, Limited access to food, Medication procurement, and ADL IADL limitations self-management and patient's adherence to plan as established by provider. Collaboration with Laurey Morale, MD regarding development and update of comprehensive plan of care as  evidenced by provider attestation       and co-signature Inter-disciplinary care team collaboration (see longitudinal plan of care) Discussed plans with patient for ongoing care management follow up and provided patient with direct contact information for care management team Reinforced importance of taking medications as ordered REinforced to call [facility] to discuss getting her medications that need to be liquid approved due to her swallowing issues with MS Working with LCSW for stress and difficulty coping with MS and financial strain Saw [facility] pharmacist on 11/26/20 with concerns with cost of medications Care guide has been referred for food resources and housing repairs-referral completed Self Care Activities:  Self administers medications as prescribed Attends all scheduled provider appointments Calls pharmacy for medication refills Performs ADL's independently Calls provider office for new concerns or questions Patient Goals: -keep appointment GI on 02/11/21 -Call [facility] concerning benefits Follow Up Plan: Telephone follow up appointment with care management team member scheduled for: 02/14/21 at 2 PM The patient has been provided with contact information for the care management team and has been advised to call with any health related questions or concerns.      Patient Care Plan: LCSW Plan of Care.     Problem Identified: Reduce and Manage Symptoms of Depression and Anxiety.   Priority: High     Goal: Reduce  and Manage Symptoms of Depression and Anxiety.   Start Date: 12/12/2020  Expected End Date: 02/12/2021  This Visit's Progress: On track  Recent Progress: On track  Priority: High  Note:   Current Barriers:   Acute Mental Health needs related to Anxiety, Stress, Chronic Pain, Depressive Illness, Multiple Sclerosis and Hypertension. Lacks Knowledge of [employer]. Needs Support, Education, and Care Coordination in order to meet unmet  mental health needs. Chronic Pain, Anxiety, Stress and Depressive Illness only exacerbate symptoms of Multiple Sclerosis. Clinical Goal(s):  Over the next 60 days, patient will work with LCSW to reduce and manage symptoms of Anxiety, Stress, Chronic Pain and Depressive Illness.   Patient will increase knowledge and/or ability of:        Coping Skills, Healthy Habits, Self-Management Skills, Stress Reduction, Home Safety and Utilizing [employer] and Resources. Clinical Interventions:  Assessed patient's previous treatment, needs, coping skills, current treatment, support system and barriers to care. PHQ-2 and PHQ-9 Depression Screening Tool performed and results reviewed with patient. Other interventions included:       Solution-Focused Therapy Performed, Mindfulness Meditation Strategies, Relaxation Techniques and Deep Breathing Exercises Encouraged, Active Listening/       Reflection Utilized, Emotional Support Provided, Problem Solving [place], Psychoeducation /Health Education, Motivational        Interviewing, Brief Cognitive Behavioral Therapy Initiated, Reviewed Mental Health Medications and Discussed Compliance, Quality of Sleep Assessed and       Sleep Hygiene Techniques Promoted, Support Group Participation Encouraged, Increase Level of Activity/Exercise, Verbalization of Feelings Encouraged,        Crisis Resource Education/Information Provided, Suicidal Ideation/Homicidal Ideation Assessed - None Present.   Patient interviewed and appropriate assessments performed. Provided mental health counseling with regards to Anxiety, Stress, Chronic Pain and Depressive Illness.     Discussed plans with patient for ongoing care management follow up and provided patient with direct contact information for care management team. Discussed several options for long-term counseling based on need and insurance.  Collaboration with Primary Care Physician, Dr. Alysia Penna  regarding development and update of comprehensive plan of care as evidenced by provider attestation and co-signature. Inter-disciplinary care team collaboration (see longitudinal plan of care). Discussion of referral to [facility] for ongoing mental health counseling and supportive services. Patient Goals/Self-Care Activities: Begin personal counseling with LCSW on a weekly/bi-weekly basis, to reduce and manage symptoms, until established with [facility]. Verbal consent obtained to place referral to [facility] for ongoing mental health counseling and supportive services. Accept all calls from [facility] to establish services, as well as to schedule the initial counseling session. Consider self-enrollment in a support group.   Practice relaxation techniques, deep breathing exercises and mindfulness meditation strategies, daily. Continue with compliance of taking prescription medications. Follow-Up:  01/07/2021 at 9:00am    Problem Identified: Find Help in My Community.   Priority: High     Goal: My Help in My Community.   Start Date: 12/12/2020  Expected End Date: 02/12/2021  This Visit's Progress: On track  Recent Progress: On track  Priority: Medium  Note:   Current Barriers:   Financial constraints related to only receiving Social Security Disability Income. Limited social support. Food insecurity.  Housing barriers, due to inability to afford monthly rent payments.   Inability to afford prescription medications. Mental health concerns related to stress and anxiety. Lacks knowledge of community resources. Clinical Goals:  Over the next 90 days, patient will work with LCSW to address needs related to  housing, finances, food insecurity, medications, stress and anxiety. Clinical Interventions:  Collaboration with Primary Care Physician, Dr. Alysia Penna regarding development and update of comprehensive plan of care as evidenced by provider attestation and  co-signature. Inter-disciplinary care team collaboration (see longitudinal plan of care). Assessment of needs, barriers, agencies contacted, as well as how impacting.  Reviewed various financial resources, discussed options and provided patient with information and how to apply for Adult Medicaid, through the Department of Social Services. Reviewed various emergency assistance resources, discussed options and provided patient with information about how to apply for emergency financial assistance. Reviewed various housing resources, discussed options and provided patient with information about how to apply for Section 8 Housing, [place], Hydrologist and Engineer, building services and [employer]. Reviewed various mental health resources, discussed options and provided patient with information about [facility]. Patient interviewed and appropriate assessments performed. Provided mental health counseling with regard to stress and anxiety.   Discussed plans with patient for ongoing care management follow-up and provided patient with direct contact information for care management team. Advised patient to begin contacting resources provided. Clinical interventions provided:  PHQ 2 and PHQ 9 Depression Screen completed and results reviewed, Solution-Focused Strategies implemented, Deep Breathing Exercises, Relaxation Techniques and Mindfulness Meditation Strategies Encouraged, Active Listening/Reflection utilized, Emotional Support provided, Behavioral Activation implemented, Problem Solving/Task-Centered Solutions established, Psychoeducation for mental health needs, Brief Cognitive Behavioral Therapy performed, Reviewed mental health medications with patient and discussed compliance, Quality of Sleep Assessed and Sleep Hygiene Techniques promoted, Participation in counseling encouraged, Participation in support group encouraged, Increase in actives/exercise encouraged, Verbalization of feelings  encouraged, Suicidal Ideation/Homicidal Ideation assessed - None present. Discussed referral to [facility] to assist with ongoing mental health counseling and supportive services.   Referral to pharmacy for medication assistance. Patient Goals/Self-Care Activities:  Review Medicaid Tips to ensure eligibility. Complete application for Adult Medicaid and submit to the Department of Social Services for processing. Call [place] [phone]) to periodically check the status of your Adult Medicaid application. Complete Section 8 Housing application and submit to the [employer] for processing. Call the [employer] to periodically check the status of your Section 8 Housing application.   Contact list of [place] resources in [place]. [employer]. Contact list of Affordable Apartments and Rental Properties. Obtain financial assistance from [facility] and [employer]. Review [facility] in [place]. Review [facility] [place] in [place]. Continue with compliance of taking medications exactly as prescribed.  Referral placed to [facility] for long-term mental health counseling and supportive services. Work with [employer] on a weekly/bi-weekly basis to obtain housing, food, financial assistance, prescription medications, and mental health counseling and resources. Follow-Up Date:  01/07/2021 at 9:00am    Patient Care Plan: [facility]CM Pharmacy Care Plan     Problem Identified: Problem: Hypertension, GERD, and MS, vitamin B12 deficiency, anemia      Long-Range Goal: Patient-Specific Goal   Start Date: 11/26/2020  Expected End Date: 11/26/2021  This Visit's Progress: On track  Priority: High  Note:   Current Barriers:  Unable to independently afford treatment regimen Unable to independently monitor  therapeutic efficacy  Pharmacist Clinical Goal(s):  Patient will verbalize ability to afford treatment regimen achieve adherence to monitoring guidelines and medication adherence to achieve therapeutic efficacy through collaboration with PharmD and provider.   Interventions: 1:1 collaboration with Laurey Morale,  MD regarding development and update of comprehensive plan of care as evidenced by provider attestation and co-signature Inter-disciplinary care team collaboration (see longitudinal plan of care) Comprehensive medication review performed; medication list updated in electronic medical record  Hypertension (BP goal <130/80) -Not ideally controlled -Current treatment: Furosemide 40 mg 1 tablet daily as needed -Medications previously tried: HCTZ, lisinopril -Current home readings: does not check -Current dietary habits: doesn't use added salt; doesn't look at package labels -Current exercise habits: not able to exercise -Denies hypotensive/hypertensive symptoms -Educated on BP goals and benefits of medications for prevention of heart attack, stroke and kidney damage; Importance of home blood pressure monitoring; Proper BP monitoring technique; -Counseled to monitor BP at home weekly, document, and provide log at future appointments -Counseled on diet and exercise extensively  Swelling (Goal: minimize swelling) -Controlled -Current treatment  Furosemide 40 mg 1 tablet daily as needed -Medications previously tried: none  -Recommended to continue current medication Counseled on importance of daily weights.  Depression/Anxiety (Goal: minimize symptoms) -Controlled -Current treatment: Citalopram 20 mg 1 tablet daily -Medications previously tried/failed: none -PHQ9: 0 -GAD7: n/a -Educated on Benefits of medication for symptom control Benefits of cognitive-behavioral therapy with or without medication -Recommended to continue current medication Patient does not wish to try  another medication at this time.  Anemia (Goal: HgB>11) -Not ideally controlled -Current treatment  Ferrous sulfate 220 mg/5 mL - not taking consistently -Medications previously tried: none  -Recommended to continue current medication Counseled on importance of taking this medication.  Seizures (Goal: prevent seizures) -Uncontrolled -Current treatment  Levetiracetam 100 mg/mL 500 mg twice daily Oxcarbazepine 300 mg 1 tablet twice daily -Medications previously tried: unknown  -Counseled on importance of taking these medications consistently. Patient does not take these twice daily due to drowsiness and cost.  GERD/erosive esophagitis (Goal: minimize symptoms) -Uncontrolled -Current treatment  Famotidine 40 mg/86m suspension  Pantoprazole 40 mg 1 tablet twice daily before a meal  Sucralfate 1 gm/143m10 mLs by mouth three times daily -Medications previously tried: unknown  -Recommended to continue current medication Counseled on efficacy of pantoprazole will not be achieved with crushing or biting this medication. Patient did not want to switch to any other medications.  Itching/allergic rhinitis (Goal: minimize symptoms) -Uncontrolled -Current treatment  Flonase 50 mcg/act 1 spray as needed Diphenhydramine 12.5 mg/66m67maily as needed Hydroxyzine 25 mg 1 tablet as needed for itching Levocetirizine 5 mg 1 tablet daily -Medications previously tried: n/a  -Recommended to continue current medication Patient was following with an allergist but does not want to follow up.  Muscle spasms (Goal: minimize symptoms) -Controlled -Current treatment  Tizanidine 2 mg 1 capsule twice daily Cyclobenzaprine 10 mg as needed for muscle spasms -Medications previously tried: none  -Counseled on not using both cyclobenzaprine and tizanidine at the same time.   Health Maintenance -Vaccine gaps: shingrix, COVID, prevnar -Current therapy:  Vitamin B12 1000 mcg/mL  Vitamin D 1000 units  daily Propranolol 20 mg 1 or 2 tablets the morning of dental procedures  Acetaminophen 160 mg/66mL91m0 mg twice daily as needed - using on -Educated on Cost vs benefit of each product must be carefully weighed by individual consumer -Patient is satisfied with current therapy and denies issues -Counseled on avoidance of NSAIDS  Patient Goals/Self-Care Activities Patient will:  - take medications as prescribed check blood pressure weekly, document, and provide at future appointments  Follow Up Plan: The care management team will reach out to the patient again over the next 30 days.  Ms. Catalina was given information about Chronic Care Management services today including:  CCM service includes personalized support from designated clinical staff supervised by her physician, including individualized plan of care and coordination with other care providers 24/7 contact phone numbers for assistance for urgent and routine care needs. Standard insurance, coinsurance, copays and deductibles apply for chronic care management only during months in which we provide at least 20 minutes of these services. Most insurances cover these services at 100%, however patients may be responsible for any copay, coinsurance and/or deductible if applicable. This service may help you avoid the need for more expensive face-to-face services. Only one practitioner may furnish and bill the service in a calendar month. The patient may stop CCM services at any time (effective at the end of the month) by phone call to the office staff.  Patient agreed to services and verbal consent obtained.   Patient verbalizes understanding of instructions provided today and agrees to view in [place].  The pharmacy team will reach out to the patient again over the next 30 days.   [name], [facility]

## 2021-01-07 ENCOUNTER — Telehealth: Payer: Self-pay | Admitting: Pharmacist

## 2021-01-07 ENCOUNTER — Telehealth (INDEPENDENT_AMBULATORY_CARE_PROVIDER_SITE_OTHER): Payer: PPO | Admitting: Family Medicine

## 2021-01-07 ENCOUNTER — Encounter: Payer: Self-pay | Admitting: Family Medicine

## 2021-01-07 ENCOUNTER — Ambulatory Visit: Payer: PPO | Admitting: *Deleted

## 2021-01-07 DIAGNOSIS — G35 Multiple sclerosis: Secondary | ICD-10-CM

## 2021-01-07 DIAGNOSIS — U071 COVID-19: Secondary | ICD-10-CM | POA: Diagnosis not present

## 2021-01-07 DIAGNOSIS — E669 Obesity, unspecified: Secondary | ICD-10-CM

## 2021-01-07 DIAGNOSIS — Z5941 Food insecurity: Secondary | ICD-10-CM

## 2021-01-07 DIAGNOSIS — I10 Essential (primary) hypertension: Secondary | ICD-10-CM

## 2021-01-07 MED ORDER — AZITHROMYCIN 250 MG PO TABS: ORAL_TABLET | ORAL | 0 refills | Status: DC

## 2021-01-07 MED ORDER — HYDROCOD POLST-CPM POLST ER 10-8 MG/5ML PO SUER: 5.0000 mL | Freq: Two times a day (BID) | ORAL | 0 refills | Status: DC | PRN

## 2021-01-07 NOTE — Progress Notes (Signed)
This encounter was created in error - please disregard.

## 2021-01-07 NOTE — Progress Notes (Signed)
   Subjective:    Patient ID: [name], female    DOB: [birth year], 55 y.o.   MRN: [id number]  HPI Virtual Visit via Telephone Note  I connected with the patient on 01/07/21 at  2:45 PM EDT by telephone and verified that I am speaking with the correct person using two identifiers.   I discussed the limitations, risks, security and privacy concerns of performing an evaluation and management service by telephone and the availability of in person appointments. I also discussed with the patient that there may be a patient responsible charge related to this service. The patient expressed understanding and agreed to proceed.  Location patient: home Location provider: work or home office Participants present for the call: patient, provider Patient did not have a visit in the prior 7 days to address this/these issue(s).   History of Present Illness: She began to have fever, ST, body aches, vomiting, and a dry cough 8 days ago. She tested positive for the Covid-19 virus on 01-03-21. Now the vomting and fever are gone, but she still has a cough that now produces green sputum. No SOB or chest pain. Drinking fluids and taking Tylenol.   Observations/Objective: Patient sounds cheerful and well on the phone. I do not appreciate any SOB. Speech and thought processing are grossly intact. Patient reported vitals:  Assessment and Plan: Covid-19 infection, now with a bronchitis. Treat with a Zpack. Recheck as needed.  Alysia Penna, MD   Follow Up Instructions:     [phone] [phone] 11-20 [phone] 21-30 I did not refer this patient for an OV in the next 24 hours for this/these issue(s).  I discussed the assessment and treatment plan with the patient. The patient was provided an opportunity to ask questions and all were answered. The patient agreed with the plan and demonstrated an understanding of the instructions.   The patient was advised to call back or seek an in-person evaluation if  the symptoms worsen or if the condition fails to improve as anticipated.  I provided 16 minutes of non-face-to-face time during this encounter.   Alysia Penna, MD     Review of Systems     Objective:   Physical Exam        Assessment & Plan:

## 2021-01-07 NOTE — Chronic Care Management (AMB) (Signed)
Chronic Care Management    Clinical Social Work Note  01/07/2021 Name: [name] MRN: [id number] DOB: 02/15/1966  [name] is a 55 y.o. year old female who is a primary care patient of Laurey Morale, MD. The CCM team was consulted to assist the patient with chronic disease management and/or care coordination needs related to: [employer] and [place] and Resources.   Engaged with patient by telephone for follow-up visit in response to provider referral for social work chronic care management and care coordination services.   Consent to Services:  The patient was given information about Chronic Care Management services, agreed to services, and gave verbal consent prior to initiation of services.  Please see initial visit note for detailed documentation.   Patient agreed to services and consent obtained.   Assessment: Review of patient past medical history, allergies, medications, and health status, including review of relevant consultants reports was performed today as part of a comprehensive evaluation and provision of chronic care management and care coordination services.     SDOH (Social Determinants of Health) assessments and interventions performed:    Advanced Directives Status: Not addressed in this encounter.  CCM Care Plan  Allergies  Allergen Reactions   Banana Swelling    Lips, tongue and face   Sulfa Antibiotics Diarrhea and Nausea And Vomiting    Syncope episode and was incoherent   Sulfacetamide Sodium Other (See Comments)    Syncope episode and was incoherent   Penicillins Diarrhea, Nausea And Vomiting and Other (See Comments)    Has patient had a PCN reaction causing immediate rash, facial/tongue/throat swelling, SOB or lightheadedness with hypotension: Yes Has patient had a PCN reaction causing severe rash involving mucus membranes or skin necrosis: No Has patient had a PCN reaction that required hospitalization  No Has patient had a PCN reaction occurring within the last 10 years: No If all of the above answers are "NO", then may proceed with Cephalosporin use.  Passed out,     Outpatient Encounter Medications as of 01/07/2021  Medication Sig   acetaminophen (TYLENOL) 160 MG/5ML solution Take 320 mg by mouth 2 (two) times daily as needed for mild pain. Reported on 07/21/2015   cholecalciferol (VITAMIN D) 1000 units tablet Take 1,000 Units by mouth daily.   citalopram (CELEXA) 20 MG tablet Take 1 tablet (20 mg total) by mouth daily.   cyanocobalamin (,VITAMIN B-12,) 1000 MCG/ML injection Inject into the muscle. (Patient not taking: Reported on 11/26/2020)   cyclobenzaprine (FLEXERIL) 10 MG tablet Take 10 mg by mouth 2 (two) times daily as needed for muscle spasms.  (Patient not taking: Reported on 11/26/2020)   diphenhydrAMINE (BENADRYL) 12.5 MG/5ML elixir Take 25 mg by mouth 4 (four) times daily as needed for allergies.   famotidine (PEPCID) 40 MG/5ML suspension Take 5 mLs (40 mg total) by mouth 2 (two) times daily.   ferrous sulfate 220 (44 Fe) MG/5ML solution Take by mouth. (Patient not taking: Reported on 11/26/2020)   fluticasone (FLONASE) 50 MCG/ACT nasal spray Place into the nose.   furosemide (LASIX) 40 MG tablet Take 1 tablet (40 mg total) by mouth daily as needed for fluid.   gabapentin (NEURONTIN) 300 MG capsule Take 300 mg by mouth 3 (three) times daily.   hydrOXYzine (ATARAX/VISTARIL) 25 MG tablet TAKE 1 TABLET (25 MG TOTAL) BY MOUTH 3 (THREE) TIMES DAILY AS NEEDED FOR ITCHING.   levETIRAcetam (KEPPRA) 100 MG/ML solution Take 500 mg by mouth 2 (two) times daily.  levocetirizine (XYZAL) 5 MG tablet Take by mouth.   Oxcarbazepine (TRILEPTAL) 300 MG tablet Take 300 mg by mouth 2 (two) times daily.    pantoprazole (PROTONIX) 40 MG tablet Take 1 tablet (40 mg total) by mouth 2 (two) times daily before a meal. (Patient not taking: Reported on 11/26/2020)   propranolol (INDERAL) 20 MG tablet TAKE 1  OR 2 TABLETS THE MORNINGS OF MEDICAL OR DENTAL PROCEDURES   sucralfate (CARAFATE) 1 GM/10ML suspension Take 10 mLs (1 g total) by mouth 3 (three) times daily.   tizanidine (ZANAFLEX) 2 MG capsule Take 2 mg by mouth 2 (two) times daily.   No facility-administered encounter medications on file as of 01/07/2021.    Patient Active Problem List   Diagnosis Date Noted   Low back pain due to bilateral sciatica 02/22/2018   Essential hypertension 05/30/2015   Right-sided chest pain 06/27/2014   Microcytic anemia 06/27/2014   Multiple sclerosis exacerbation ([place]) 06/27/2014   Obesity (BMI 30-39.9) 06/27/2014   B12 deficiency 06/06/2013   Multiple sclerosis ([place]) 08/12/2010   GERD 12/30/2006   PARESTHESIA 12/30/2006    Conditions to be addressed/monitored: Anxiety and Depression.  Film/video editor Related to Housing, Medications, Transportation, Utilities, Etc., and Mental Health Concerns.  Care Plan : LCSW Plan of Care.  Updates made by Francis Gaines, LCSW since 01/07/2021 12:00 AM     Problem: Reduce and Manage Symptoms of Depression and Anxiety. Resolved 01/07/2021  Priority: High     Goal: Reduce and Manage Symptoms of Depression and Anxiety. Completed 01/07/2021  Start Date: 12/12/2020  Expected End Date: 01/07/2021  This Visit's Progress: On track  Recent Progress: On track  Priority: High  Note:   Current Barriers:   Acute Mental Health needs related to Anxiety, Stress, Chronic Pain, Depressive Illness, Multiple Sclerosis and Hypertension. Chronic Pain, Anxiety, Stress and Depressive Illness only exacerbate symptoms of Multiple Sclerosis. Clinical Goal(s):  Patient will work with LCSW to reduce and manage symptoms of Anxiety, Stress, Chronic Pain and Depressive Illness.   Patient will increase knowledge and/or ability of:        Coping Skills, Healthy Habits, Self-Management Skills, Stress Reduction, Home Safety and Utilizing [employer] and Resources. Clinical  Interventions: Interventions included:       Solution-Focused Therapy Performed, Mindfulness Meditation Strategies, Relaxation Techniques and Deep Breathing Exercises Encouraged, Active Listening/       Reflection Utilized, Emotional Support Provided, Problem Solving /Task Center Solutions Developed, Psychoeducation /Health Education, Motivational        Interviewing, Brief Cognitive Behavioral Therapy Initiated, Reviewed Mental Health Medications and Discussed Compliance, Quality of Sleep Assessed and       Sleep Hygiene Techniques Promoted, Support Group Participation Encouraged, Increase Level of Activity/Exercise, Verbalization of Feelings Encouraged,        Crisis Resource Education/Information Provided, Suicidal Ideation/Homicidal Ideation Assessed - None Present.   Provided mental health counseling with regards to Anxiety, Stress, Chronic Pain and Depressive Illness.     Patient Goals/Self-Care Activities: Receive ongoing mental health counseling and supportive services through [facility].     Continue to incorporate into daily practice - relaxation techniques, deep breathing exercises and mindfulness meditation strategies. Continue with compliance of taking prescription medications. Contact LCSW directly (# [id number]) if additional social work needs are identified in the near future. Follow-Up:  No Follow-Up Required, Per Patient.    Problem: Find Help in My Community. Resolved 01/07/2021  Priority: Medium     Goal: My Help in My Community. Completed  01/07/2021  Start Date: 12/12/2020  Expected End Date: 01/07/2021  This Visit's Progress: On track  Recent Progress: On track  Priority: Medium  Note:   Current Barriers:   Financial constraints related to only receiving Social Security Disability Income. Lacks knowledge of community resources. Clinical Goals:  Patient will work with LCSW to address needs related to housing, finances, food insecurity, medications, stress and  anxiety. Clinical Interventions:  Collaboration with Primary Care Physician, Dr. Alysia Penna regarding development and update of comprehensive plan of care as evidenced by provider attestation and co-signature. Inter-disciplinary care team collaboration (see longitudinal plan of care). Patient Goals/Self-Care Activities:  Call [place] [phone]) to periodically check the status of your Adult Medicaid application. Follow-Up Date:  No Follow-Up Required, Per Patient.      Follow-Up Plan:  No Follow-Up Required, Per Patient.      Nat Christen LCSW Licensed Clinical Social Worker [place] [phone]

## 2021-01-07 NOTE — Progress Notes (Addendum)
Chronic Care Management Pharmacy Assistant   Name: [name]  MRN: [id number] DOB: 1965/09/11  Reason for Encounter: Medication Assistance Documentation   Per Jeni Salles: Good RX copay cards to assist with medications; all medications and savings found for good RX will be forwarded to patient via mail; Physical savings card application filled out and patient called for email to sign her up . Confirmed with her to be on the look out for a card from them and expected prices ive found and where to get medications. Patient ok on enrolling advised her to watch the mail for the card and list from me, as well as he e-mail for any verifications they may send her.   01-08-21 Per msg from Clinical Pharmacist patient is no longer taking her medications and may not need patient assistance as outlined above, called to verify with patient who states the only medications she has stopped are the ones for her ulcer that is now healed, she still wishes to have the information for her other medications. Will proceed in sending out through the mail as outlined above.   Medications: Outpatient Encounter Medications as of 01/06/2021  Medication Sig   acetaminophen (TYLENOL) 160 MG/5ML solution Take 320 mg by mouth 2 (two) times daily as needed for mild pain. Reported on 07/21/2015   cholecalciferol (VITAMIN D) 1000 units tablet Take 1,000 Units by mouth daily.   citalopram (CELEXA) 20 MG tablet Take 1 tablet (20 mg total) by mouth daily.   cyanocobalamin (,VITAMIN B-12,) 1000 MCG/ML injection Inject into the muscle. (Patient not taking: Reported on 11/26/2020)   cyclobenzaprine (FLEXERIL) 10 MG tablet Take 10 mg by mouth 2 (two) times daily as needed for muscle spasms.  (Patient not taking: Reported on 11/26/2020)   diphenhydrAMINE (BENADRYL) 12.5 MG/5ML elixir Take 25 mg by mouth 4 (four) times daily as needed for allergies.   famotidine (PEPCID) 40 MG/5ML suspension Take 5 mLs (40 mg total) by mouth 2  (two) times daily.   ferrous sulfate 220 (44 Fe) MG/5ML solution Take by mouth. (Patient not taking: Reported on 11/26/2020)   fluticasone (FLONASE) 50 MCG/ACT nasal spray Place into the nose.   furosemide (LASIX) 40 MG tablet Take 1 tablet (40 mg total) by mouth daily as needed for fluid.   gabapentin (NEURONTIN) 300 MG capsule Take 300 mg by mouth 3 (three) times daily.   hydrOXYzine (ATARAX/VISTARIL) 25 MG tablet TAKE 1 TABLET (25 MG TOTAL) BY MOUTH 3 (THREE) TIMES DAILY AS NEEDED FOR ITCHING.   levETIRAcetam (KEPPRA) 100 MG/ML solution Take 500 mg by mouth 2 (two) times daily.   levocetirizine (XYZAL) 5 MG tablet Take by mouth.   Oxcarbazepine (TRILEPTAL) 300 MG tablet Take 300 mg by mouth 2 (two) times daily.    pantoprazole (PROTONIX) 40 MG tablet Take 1 tablet (40 mg total) by mouth 2 (two) times daily before a meal. (Patient not taking: Reported on 11/26/2020)   propranolol (INDERAL) 20 MG tablet TAKE 1 OR 2 TABLETS THE MORNINGS OF MEDICAL OR DENTAL PROCEDURES   sucralfate (CARAFATE) 1 GM/10ML suspension Take 10 mLs (1 g total) by mouth 3 (three) times daily.   tizanidine (ZANAFLEX) 2 MG capsule Take 2 mg by mouth 2 (two) times daily.   No facility-administered encounter medications on file as of 01/06/2021.    Care Gaps: Pneumococcal Vaccine - Overdue Hepatitis C Screening- Overdue Zoster Vaccine - Overdue PAP Smear - Overdue COVID Booster #3 Therapist, music) - Overdue Flu Vaccine - Overdue  Star Rating Drugs: None  Ned Clines [place] Clinical Pharmacist Assistant [phone]

## 2021-01-07 NOTE — Patient Instructions (Signed)
Visit Information  PATIENT GOALS:  Goals Addressed               This Visit's Progress     COMPLETED: Find Help in My Community. (pt-stated)   On track     Timeframe:  Short-Term Goal Priority:  Medium Start Date:  12/12/2020                          Expected End Date:  01/07/2021                  Follow-Up Date: No Follow-Up Required, Per Patient.  Patient Goals/Self-Care Activities:  Call [place] [phone]) to periodically check the status of your Adult Medicaid application.      COMPLETED: Reduce and Manage Symptoms of Depression and Anxiety. (pt-stated)   On track     Timeframe:  Short-Term Goal Priority:  High Start Date:  12/12/2020                          Expected End Date:  01/07/2021                    Follow-Up Date:  No Follow-Up Required, Per Patient.  Patient Goals/Self-Care Activities: Receive ongoing mental health counseling and supportive services through [facility].     Continue to incorporate into daily practice - relaxation techniques, deep breathing exercises and mindfulness meditation strategies. Continue with compliance of taking prescription medications. Contact LCSW directly (# [id number]) if additional social work needs are identified in the near future.          Patient verbalizes understanding of instructions provided today and agrees to view in [place].   No Follow-Up Required, Per Patient.  Nat Christen LCSW Licensed Clinical Social Worker [place] [phone]

## 2021-01-15 ENCOUNTER — Telehealth: Payer: Self-pay | Admitting: Family Medicine

## 2021-01-15 DIAGNOSIS — I10 Essential (primary) hypertension: Secondary | ICD-10-CM | POA: Diagnosis not present

## 2021-01-15 NOTE — Telephone Encounter (Signed)
No it's too soon (just 7 days ago). Try Delsym instead

## 2021-01-15 NOTE — Telephone Encounter (Signed)
Lvm to call back for message. 

## 2021-01-15 NOTE — Telephone Encounter (Signed)
Spoke with patient, message given.   Will purchase over the counter Delsym.  Nothing further is needed

## 2021-01-17 ENCOUNTER — Telehealth: Payer: Self-pay

## 2021-01-17 ENCOUNTER — Telehealth: Payer: Self-pay | Admitting: Family Medicine

## 2021-01-17 ENCOUNTER — Other Ambulatory Visit: Payer: Self-pay

## 2021-01-17 MED ORDER — TIZANIDINE HCL 2 MG PO CAPS
2.0000 mg | ORAL_CAPSULE | Freq: Two times a day (BID) | ORAL | 2 refills | Status: DC
Start: 2021-01-17 — End: 2021-07-10

## 2021-01-17 MED ORDER — GABAPENTIN 300 MG PO CAPS: 300.0000 mg | ORAL_CAPSULE | Freq: Three times a day (TID) | ORAL | 2 refills | Status: DC

## 2021-01-17 MED ORDER — CITALOPRAM HYDROBROMIDE 20 MG PO TABS: 20.0000 mg | ORAL_TABLET | Freq: Every day | ORAL | 0 refills | Status: DC

## 2021-01-17 MED ORDER — OXCARBAZEPINE 300 MG PO TABS: 300.0000 mg | ORAL_TABLET | Freq: Two times a day (BID) | ORAL | 2 refills | Status: DC

## 2021-01-17 MED ORDER — LEVETIRACETAM 500 MG PO TABS: 500.0000 mg | ORAL_TABLET | Freq: Two times a day (BID) | ORAL | 3 refills | Status: DC

## 2021-01-17 NOTE — Telephone Encounter (Signed)
TELEPHONE ENCOUNTER PRIMARY CARE PROVIDER: Laurey Morale, MD  REASON FOR CALL: pt is requesting a refill on the following medications / she states she is out of medications  gabapentin (NEURONTIN) 300 MG capsule levETIRAcetam (KEPPRA) 500 MG/ML solution Oxcarbazepine (TRILEPTAL) 300 MG tablet citalopram (CELEXA) 20 MG tablet tizanidine (ZANAFLEX) 2 MG tablet  [id number] [place] [facility] [address]  Phone:[phone] Fax:  [phone] LAST VISIT IN THIS CLINIC:  01/17/2021 NEXT APPOINTMENT SCHEDULED IN THIS CLINIC: 02/14/2021

## 2021-01-17 NOTE — Telephone Encounter (Signed)
Rx Sent  

## 2021-01-17 NOTE — Telephone Encounter (Signed)
Patient call requesting a call back regarding medications

## 2021-01-17 NOTE — Telephone Encounter (Signed)
Yes it is okay to change the Keppra to 500 mg BID

## 2021-01-17 NOTE — Telephone Encounter (Signed)
Pt Rx sent waiting approval to change Keppra Rx from Dr Sarajane Jews

## 2021-01-17 NOTE — Telephone Encounter (Signed)
Pt Rx sent to pharmacy

## 2021-01-17 NOTE — Telephone Encounter (Signed)
Pt medications have been sent to her pharmacy as requested except for her Keppra which pt state was changed to 500 mg BID at [facility], pt chart state that she was previously taking 100 mg/mL solution, no documents on the change in pt chart. Ok to change to 500 mg tablets BID

## 2021-01-28 ENCOUNTER — Ambulatory Visit: Payer: PPO

## 2021-02-03 ENCOUNTER — Telehealth: Payer: Self-pay | Admitting: Pharmacist

## 2021-02-03 NOTE — Progress Notes (Signed)
Chronic Care Management Pharmacy Assistant   Name: [name]  MRN: [id number] DOB: [birth year]  Reason for Encounter: General Assessment Call    Conditions to be addressed/monitored: HTN  Recent office visits:  01-07-2021 Laurey Morale, MD - Patient presented for COVID-19 virus infection. Prescribed Azithromycin and Chlorpheniramine-HYDROcodone Stopped Famotidine, Ferrous Sulfate, Pantoprazole and Sucralfate.  01-07-2021 Saporito, Maree Erie, LCSW - Patient presented for Multiple Sclerosis and other concerns. No medication changes.   Recent consult visits:  None  Hospital visits:  Medication Reconciliation was completed by comparing discharge summary, patient's EMR and Pharmacy list, and upon discussion with patient.   Admitted to the hospital on 04.17.2022 due to Weakness. Discharge date was 04.18.2022. Discharged from [place]?Medications Started at [facility] Discharge:?? -started the following medication Famotidine 40 mg/70m     Medication Changes at Hospital Discharge: -Changed none   Medications Discontinued at Hospital Discharge: -Stopped none   Medications that remain the same after Hospital Discharge:??  -All other medications will remain the same.     Medications: Outpatient Encounter Medications as of 02/03/2021  Medication Sig   acetaminophen (TYLENOL) 160 MG/5ML solution Take 320 mg by mouth 2 (two) times daily as needed for mild pain. Reported on 07/21/2015   azithromycin (ZITHROMAX Z-PAK) 250 MG tablet As directed   chlorpheniramine-HYDROcodone (TUSSIONEX PENNKINETIC ER) 10-8 MG/5ML SUER Take 5 mLs by mouth every 12 (twelve) hours as needed for cough.   cholecalciferol (VITAMIN D) 1000 units tablet Take 1,000 Units by mouth daily. OTC   citalopram (CELEXA) 20 MG tablet Take 1 tablet (20 mg total) by mouth daily.   cyanocobalamin (,VITAMIN B-12,) 1000 MCG/ML injection Inject into the muscle.   cyclobenzaprine  (FLEXERIL) 10 MG tablet Take 10 mg by mouth 2 (two) times daily as needed for muscle spasms.   diphenhydrAMINE (BENADRYL) 12.5 MG/5ML elixir Take 25 mg by mouth 4 (four) times daily as needed for allergies.   fluticasone (FLONASE) 50 MCG/ACT nasal spray Place into the nose.   furosemide (LASIX) 40 MG tablet Take 1 tablet (40 mg total) by mouth daily as needed for fluid.   gabapentin (NEURONTIN) 300 MG capsule Take 1 capsule (300 mg total) by mouth 3 (three) times daily.   hydrOXYzine (ATARAX/VISTARIL) 25 MG tablet TAKE 1 TABLET (25 MG TOTAL) BY MOUTH 3 (THREE) TIMES DAILY AS NEEDED FOR ITCHING.   levETIRAcetam (KEPPRA) 500 MG tablet Take 1 tablet (500 mg total) by mouth 2 (two) times daily.   levocetirizine (XYZAL) 5 MG tablet Take by mouth.   Oxcarbazepine (TRILEPTAL) 300 MG tablet Take 1 tablet (300 mg total) by mouth 2 (two) times daily.   propranolol (INDERAL) 20 MG tablet TAKE 1 OR 2 TABLETS THE MORNINGS OF MEDICAL OR DENTAL PROCEDURES   tizanidine (ZANAFLEX) 2 MG capsule Take 1 capsule (2 mg total) by mouth 2 (two) times daily.   No facility-administered encounter medications on file as of 02/03/2021.  Reviewed chart prior to disease state call. Spoke with patient regarding BP  Recent Office Vitals: BP Readings from Last 3 Encounters:  07/15/20 120/78  03/19/20 (!) 146/112  12/19/19 (!) 142/80   Pulse Readings from Last 3 Encounters:  07/15/20 (!) 102  03/19/20 77  12/19/19 83    Wt Readings from Last 3 Encounters:  07/15/20 244 lb (110.7 kg)  03/19/20 251 lb (113.9 kg)  12/19/19 251 lb 6.4 oz (114 kg)     Kidney Function Lab Results  Component Value Date/Time   CREATININE 0.96 06/02/2018 03:09 PM   CREATININE 1.01 03/09/2018 04:22 PM   CREATININE 0.89 01/07/2017 04:07 PM   CREATININE 0.92 12/07/2016 02:18 PM   GFR 74.64 03/09/2018 04:22 PM   GFRNONAA >60 06/02/2018 03:09 PM   GFRAA >60 06/02/2018 03:09 PM    BMP Latest Ref Rng & Units 12/20/2018 06/02/2018 03/09/2018   Glucose 70 - 99 mg/dL - 91 99  BUN 4 - '21 12 13 12  '$ Creatinine 0.44 - 1.00 mg/dL - 0.96 1.01  BUN/Creat Ratio 9 - 23 - - -  Sodium 137 - 147 141 138 138  Potassium 3.4 - 5.3 4.0 3.6 3.5  Chloride 98 - 111 mmol/L - 107 104  CO2 22 - 32 mmol/L - 22 26  Calcium 8.9 - 10.3 mg/dL - 8.9 8.8    Current antihypertensive regimen:  None (Furosemide 40 mg 1 tablet daily as needed) How often are you checking your Blood Pressure? Patient reports she has not been checking What recent interventions/DTPs have been made by any provider to improve Blood Pressure control since last CPP Visit: Patient reports none Any recent hospitalizations or ED visits since last visit with CPP? None  What diet changes have been made to improve Blood Pressure Control?  Patient reports she has been eating ok What exercise is being done to improve your Blood Pressure Control?  Patient reports she and her husband recently had major surgeries and are both recovering from having COVID.  Adherence Review: Is the patient currently on ACE/ARB medication? No Does the patient have >5 day gap between last estimated fill dates? Yes FUROSEMIDE 40 MG TABLET 07/15/2020 90   Patient reports she checks her legs and feet for swelling daily and they are doing well she is taking her Furosemide as needed, she reports she has not been having any headaches or any symptoms of her pressures being high, she reports she has a lot going on and is helping care for her husband so has not been checking. Offered her an appointment to follow up with the Clinical Pharmacist and she accepted.   Care Gaps: Hepatitis C Screening- Overdue Zoster Vaccine - Overdue PAP Smear - Overdue COVID Booster #3 [employer]) - Overdue Flu Vaccine - Overdue AWV - Done 11-12-2020 CCM - 05-28-2021  Star Rating Drugs: None  Ned Clines [facility] Clinical Pharmacist Assistant [phone]         29 min spent

## 2021-02-14 ENCOUNTER — Ambulatory Visit (INDEPENDENT_AMBULATORY_CARE_PROVIDER_SITE_OTHER): Payer: PPO

## 2021-02-14 DIAGNOSIS — I10 Essential (primary) hypertension: Secondary | ICD-10-CM | POA: Diagnosis not present

## 2021-02-14 DIAGNOSIS — K2101 Gastro-esophageal reflux disease with esophagitis, with bleeding: Secondary | ICD-10-CM

## 2021-02-14 DIAGNOSIS — G35 Multiple sclerosis: Secondary | ICD-10-CM

## 2021-02-14 NOTE — Chronic Care Management (AMB) (Signed)
Chronic Care Management   CCM RN Visit Note  02/14/2021 Name: [name] MRN: [id number] DOB: 1965-11-01  Subjective: RODNISHA Francis is a 55 y.o. year old female who is a primary care patient of Laurey Morale, MD. The care management team was consulted for assistance with disease management and care coordination needs.    Engaged with patient by telephone for follow up visit in response to provider referral for case management and/or care coordination services.   Consent to Services:  The patient was given information about Chronic Care Management services, agreed to services, and gave verbal consent prior to initiation of services.  Please see initial visit note for detailed documentation.   Patient agreed to services and verbal consent obtained.   Assessment: Review of patient past medical history, allergies, medications, health status, including review of consultants reports, laboratory and other test data, was performed as part of comprehensive evaluation and provision of chronic care management services.   SDOH (Social Determinants of Health) assessments and interventions performed:    CCM Care Plan  Allergies  Allergen Reactions   Banana Swelling    Lips, tongue and face   Sulfa Antibiotics Diarrhea and Nausea And Vomiting    Syncope episode and was incoherent   Sulfacetamide Sodium Other (See Comments)    Syncope episode and was incoherent   Penicillins Diarrhea, Nausea And Vomiting and Other (See Comments)    Has patient had a PCN reaction causing immediate rash, facial/tongue/throat swelling, SOB or lightheadedness with hypotension: Yes Has patient had a PCN reaction causing severe rash involving mucus membranes or skin necrosis: No Has patient had a PCN reaction that required hospitalization No Has patient had a PCN reaction occurring within the last 10 years: No If all of the above answers are "NO", then may proceed with Cephalosporin use.  Passed  out,     Outpatient Encounter Medications as of 02/14/2021  Medication Sig   acetaminophen (TYLENOL) 160 MG/5ML solution Take 320 mg by mouth 2 (two) times daily as needed for mild pain. Reported on 07/21/2015   azithromycin (ZITHROMAX Z-PAK) 250 MG tablet As directed   chlorpheniramine-HYDROcodone (TUSSIONEX PENNKINETIC ER) 10-8 MG/5ML SUER Take 5 mLs by mouth every 12 (twelve) hours as needed for cough.   cholecalciferol (VITAMIN D) 1000 units tablet Take 1,000 Units by mouth daily. OTC   citalopram (CELEXA) 20 MG tablet Take 1 tablet (20 mg total) by mouth daily.   cyanocobalamin (,VITAMIN B-12,) 1000 MCG/ML injection Inject into the muscle.   cyclobenzaprine (FLEXERIL) 10 MG tablet Take 10 mg by mouth 2 (two) times daily as needed for muscle spasms.   diphenhydrAMINE (BENADRYL) 12.5 MG/5ML elixir Take 25 mg by mouth 4 (four) times daily as needed for allergies.   fluticasone (FLONASE) 50 MCG/ACT nasal spray Place into the nose.   furosemide (LASIX) 40 MG tablet Take 1 tablet (40 mg total) by mouth daily as needed for fluid.   gabapentin (NEURONTIN) 300 MG capsule Take 1 capsule (300 mg total) by mouth 3 (three) times daily.   hydrOXYzine (ATARAX/VISTARIL) 25 MG tablet TAKE 1 TABLET (25 MG TOTAL) BY MOUTH 3 (THREE) TIMES DAILY AS NEEDED FOR ITCHING.   levETIRAcetam (KEPPRA) 500 MG tablet Take 1 tablet (500 mg total) by mouth 2 (two) times daily.   levocetirizine (XYZAL) 5 MG tablet Take by mouth.   Oxcarbazepine (TRILEPTAL) 300 MG tablet Take 1 tablet (300 mg total) by mouth 2 (two) times daily.   propranolol (INDERAL) 20 MG tablet  TAKE 1 OR 2 TABLETS THE MORNINGS OF MEDICAL OR DENTAL PROCEDURES   tizanidine (ZANAFLEX) 2 MG capsule Take 1 capsule (2 mg total) by mouth 2 (two) times daily.   No facility-administered encounter medications on file as of 02/14/2021.    Patient Active Problem List   Diagnosis Date Noted   COVID-19 virus infection 01/07/2021   Low back pain due to bilateral  sciatica 02/22/2018   Essential hypertension 05/30/2015   Right-sided chest pain 06/27/2014   Microcytic anemia 06/27/2014   Multiple sclerosis exacerbation ([place]) 06/27/2014   Obesity (BMI 30-39.9) 06/27/2014   B12 deficiency 06/06/2013   Multiple sclerosis ([place]) 08/12/2010   GERD 12/30/2006   PARESTHESIA 12/30/2006    Conditions to be addressed/monitored:HTN and Multiple sclerosis, GERD  Care Plan : Multiple Sclerosis (Adult)  Updates made by [name], RN since 02/14/2021 12:00 AM     Problem: Lack of self management of Multiple Sclerosis   Priority: High     Long-Range Goal: Effective self management of Multiple Sclerosis   Start Date: 11/22/2020  Expected End Date: 05/18/2021  This Visit's Progress: On track  Recent Progress: On track  Priority: High  Note:   Current Barriers:  Ineffective Self Health Maintenance of Multiple Sclerosis with hx of seizures, HTN, GERD Unable to independently self manage MS Unable to self administer medications as prescribed due to cost Does not adhere to provider recommendations re:  Unable to perform IADLs independently: needs assist with shopping, transportation and sometimes with dressing depending if MS is flaring up Pt states that she was dx with MS in 2008.  States she goes to neurology at [facility] for treatment.  States her MS has progressed to the point where she need to have chemo.  States she has good days and bad days .  States that the hot weather has really been difficult for her this year. States she has difficulty swallowing liquids and her pills need to be crushed or in liquid form.   States she is handling her stress better and she received resources from the Education officer, museum.  States she is to see her MS doctor at [facility] in October.  States she is affording her medications and food at this time. States she had a fall Monday with no injury.  States with her MS she just sometimes goes down. Clinical Goal(s):  Collaboration with Laurey Morale, MD regarding development and update of comprehensive plan of care as evidenced by provider attestation and co-signature Inter-disciplinary care team collaboration (see longitudinal plan of care) patient will work with care management team to address care coordination and chronic disease management needs related to Disease Management Educational Needs Care Coordination Medication Assistance  Psychosocial Support   Interventions:  Evaluation of current treatment plan related to HTN and MS, seizures, GERD , Financial constraints related to medications,housing and food, Limited access to food, Medication procurement, and ADL IADL limitations self-management and patient's adherence to plan as established by provider. Collaboration with Laurey Morale, MD regarding development and update of comprehensive plan of care as evidenced by provider attestation       and co-signature Inter-disciplinary care team collaboration (see longitudinal plan of care) Discussed plans with patient for ongoing care management follow up and provided patient with direct contact information for care management team Reinforced fall prevention and home safety  Reinforced importance of pacing her activity and to avoid activity during the heat of the day Working with LCSW for stress and difficulty coping with  MS and financial strain-referral completed Saw CCM pharmacist on 11/26/20 for concerns with cost of medications next visit 05/28/21 Self Care Activities:  Self administers medications as prescribed Attends all scheduled provider appointments Calls pharmacy for medication refills Performs ADL's independently Patient Goals: - eat healthy - get at least 8 hours of sleep at night - keep the bedroom cool and dark - maintain a healthy weight - stop using electronic devices like cell phone or video game when getting ready for sleep - use a fan or white noise in bedroom - use cooling vest - use devices that will  help like a cane, sock-puller or reacher - use meditation or relaxation techniques - add more outdoor lighting - always use handrails on the stairs - always wear shoes or slippers with non-slip sole - install bathroom grab bars - keep a flashlight by the bed - keep cell phone with me always - make an emergency alert plan in case I fall - pick up clutter from the floors - use a cane or walker - use a nightlight in the bathroom, halls - use a nonslip pad with throw rugs, or remove them completely - always wear low-heeled or flat shoes or slippers with nonskid soles Follow Up Plan: Telephone follow up appointment with care management team member scheduled for: 02/14/21 at 2 PM The patient has been provided with contact information for the care management team and has been advised to call with any health related questions or concerns.      Care Plan : Hypertension (Adult)  Updates made by [name], RN since 02/14/2021 12:00 AM     Problem: Lack of self managment of Hypertension (Hypertension)   Priority: Medium     Long-Range Goal: Effective self management of Hypertension   Start Date: 11/22/2020  Expected End Date: 04/17/2021  This Visit's Progress: On track  Recent Progress: On track  Priority: Medium  Note:   Objective:  Last practice recorded BP readings:  BP Readings from Last 3 Encounters:  07/15/20 120/78  03/19/20 (!) 146/112  12/19/19 (!) 142/80   Most recent eGFR/CrCl: No results found for: EGFR  No components found for: CRCL Current Barriers:  Knowledge Deficits related to basic understanding of hypertension pathophysiology and self care management Knowledge Deficits related to understanding of medications prescribed for management of hypertension Difficulty obtaining medications Financial Constraints.  Unable to independently self manage HTN Does not adhere to prescribed medication regimen Pt states that her B/P has been good when she has had it checked.  States she tries to follow a low salt diet but they do eat take out frequently Case Manager Clinical Goal(s):  patient will verbalize understanding of plan for hypertension management patient will attend all scheduled medical appointments:  [place] 10/22 patient will demonstrate improved health management independence as evidenced by checking blood pressure as directed and notifying PCP if SBP>160 or DBP > 90, taking all medications as prescribe, and adhering to a low sodium diet as discussed. Interventions:  Collaboration with Laurey Morale, MD regarding development and update of comprehensive plan of care as evidenced by provider attestation and co-signature Inter-disciplinary care team collaboration (see longitudinal plan of care) Evaluation of current treatment plan related to hypertension self management and patient's adherence to plan as established by provider. Reinforced education on  stroke prevention, s/s of heart attack and stroke, DASH diet, complications of uncontrolled blood pressure Reviewed medications with patient and discussed importance of compliance Discussed plans with patient for ongoing  care management follow up and provided patient with direct contact information for care management team Reinforced to monitor blood pressure daily and record, calling PCP for findings outside established parameters.  Reviewed scheduled/upcoming provider appointments including: [place] [facility] 10/22 Self-Care Activities: - Self administers medications as prescribed Attends all scheduled provider appointments Calls provider office for new concerns, questions, or BP outside discussed parameters Checks BP and records as discussed Follows a low sodium diet/DASH diet Patient Goals: - check blood pressure 3 times per week - choose a place to take my blood pressure (home, clinic or office, retail store) - write blood pressure results in a log or diary - ask questions to  understand  Follow Up Plan: Telephone follow up appointment with care management team member scheduled for: 04/15/21 at 3:30 PM The patient has been provided with contact information for the care management team and has been advised to call with any health related questions or concerns.      Care Plan : GERD  Updates made by [name], RN since 02/14/2021 12:00 AM  Completed 02/14/2021   Problem: Health Promotion or Disease Self-Management of GERD Resolved 02/14/2021  Priority: Medium     Long-Range Goal: Self-Management Plan Developed for GERD Completed 02/14/2021  Start Date: 11/22/2020  Expected End Date: 04/17/2021  This Visit's Progress: On track  Recent Progress: On track  Priority: Medium  Note:   Current Barriers:  Ineffective Self Health Maintenance of GERD Unable to independently self manage GERD Unable to self administer medications as prescribed States she has had heartburn and GI problems for a long time due to her MS.  States she is followed by GI at [facility] and she had her  scope on 11/28/20.  States it showed no bleeding and her ulcers are healed.  States she is now taking an over the counter acid capsule that she can open and up in apple sauce. States has not been having any difficulties with her stomach or heart burn Clinical Goal(s):  Collaboration with Laurey Morale, MD regarding development and update of comprehensive plan of care as evidenced by provider attestation and co-signature Inter-disciplinary care team collaboration (see longitudinal plan of care) patient will work with care management team to address care coordination and chronic disease management needs related to Disease Management Educational Needs Care Coordination Medication Assistance  Psychosocial Support   Interventions:  Evaluation of current treatment plan related to  GERD , Financial constraints related to cost of medications, Limited access to food, Medication  procurement, and ADL IADL limitations self-management and patient's adherence to plan as established by provider. Collaboration with Laurey Morale, MD regarding development and update of comprehensive plan of care as evidenced by provider attestation       and co-signature Inter-disciplinary care team collaboration (see longitudinal plan of care) Discussed plans with patient for ongoing care management follow up and provided patient with direct contact information for care management team Reinforced importance of taking medications as ordered Working with LCSW for stress and difficulty coping with MS and financial strain Saw [facility]CM pharmacist on 11/26/20 with concerns with cost of medications next visit scheduled 05/28/21 Care guide has been referred for food resources and housing repairs-referral completed Self Care Activities:  Self administers medications as prescribed Attends all scheduled provider appointments Calls pharmacy for medication refills Performs ADL's independently Calls provider office for new concerns or questions Patient Goals: -Call Health Team [employer] concerning benefits Follow Up Plan: Goal completed.Telephone follow up  appointment with care management team member scheduled for: 04/15/21 at 3:30 PM The patient has been provided with contact information for the care management team and has been advised to call with any health related questions or concerns.       Plan:Telephone follow up appointment with care management team member scheduled for:  04/15/21 and The patient has been provided with contact information for the care management team and has been advised to call with any health related questions or concerns.  Peter Garter RN, Jackquline Denmark, CDE Care Management Coordinator [facility] [phone], Mobile [phone]

## 2021-02-14 NOTE — Patient Instructions (Signed)
Visit Information  PATIENT GOALS:  Goals Addressed             This Visit's Progress    RNCM:Keep Myself Safe/Fall prevention-Multiple Sclerosis   On track    Timeframe:  Long-Range Goal Priority:  High Start Date:       11/22/20                      Expected End Date:      04/17/21                 Follow Up Date 04/15/21    - add more outdoor lighting - always use handrails on the stairs - always wear shoes or slippers with non-slip sole - install bathroom grab bars - keep a flashlight by the bed - keep cell phone with me always - make an emergency alert plan in case I fall - pick up clutter from the floors - use a cane or walker - use a nightlight in the bathroom, halls - use a nonslip pad with throw rugs, or remove them completely - always wear low-heeled or flat shoes or slippers with nonskid soles    Why is this important?     Notes:      RNCM:Manage Fatigue (Tiredness-Multiple Sclerosis)   On track    Timeframe:  Long-Range Goal Priority:  Medium Start Date:    11/22/20                         Expected End Date:     04/17/21                  Follow Up Date 04/15/21    - eat healthy - get at least 8 hours of sleep at night - keep the bedroom cool and dark - maintain a healthy weight - stop using electronic devices like cell phone or video game when getting ready for sleep - use a fan or white noise in bedroom - use cooling vest - use devices that will help like a cane, sock-puller or reacher - use meditation or relaxation techniques    Why is this important?   MS (multiple sclerosis) can drain your loved one's/your energy. It can keep from doing things your loved one/you would like to do.  There are many things that your loved one/you can do to manage fatigue.    Notes:      RNCM:Track and Manage My Blood Pressure-Hypertension   On track    Timeframe:  Long-Range Goal Priority:  Medium Start Date:     11/22/20                        Expected End Date:    04/17/21                    Follow Up Date 04/15/21    - check blood pressure 3 times per week - choose a place to take my blood pressure (home, clinic or office, retail store) - write blood pressure results in a log or diary    Why is this important?   You won't feel high blood pressure, but it can still hurt your blood vessels.  High blood pressure can cause heart or kidney problems. It can also cause a stroke.  Making lifestyle changes like losing a little weight or eating less salt will help.  Checking your blood pressure  at home and at different times of the day can help to control blood pressure.  If the doctor prescribes medicine remember to take it the way the doctor ordered.  Call the office if you cannot afford the medicine or if there are questions about it.     Notes:         The patient verbalized understanding of instructions, educational materials, and care plan provided today and declined offer to receive copy of patient instructions, educational materials, and care plan.   Telephone follow up appointment with care management team member scheduled for: 04/15/21 at 3 :44 PM Peter Garter RN, [facility], CDE Care Management Coordinator [facility] [phone], Mobile [phone]

## 2021-02-20 DIAGNOSIS — G35 Multiple sclerosis: Secondary | ICD-10-CM | POA: Diagnosis not present

## 2021-03-03 ENCOUNTER — Ambulatory Visit: Payer: PPO

## 2021-03-04 ENCOUNTER — Other Ambulatory Visit (INDEPENDENT_AMBULATORY_CARE_PROVIDER_SITE_OTHER): Payer: PPO

## 2021-03-04 ENCOUNTER — Ambulatory Visit: Payer: PPO

## 2021-03-04 ENCOUNTER — Other Ambulatory Visit: Payer: Self-pay

## 2021-03-04 ENCOUNTER — Telehealth: Payer: Self-pay

## 2021-03-04 DIAGNOSIS — E538 Deficiency of other specified B group vitamins: Secondary | ICD-10-CM

## 2021-03-04 LAB — VITAMIN B12: Vitamin B-12: 250 pg/mL (ref 211–911)

## 2021-03-04 NOTE — Telephone Encounter (Signed)
Last OV 07/15/20. Last Vitamin B12 lab & order 08/15/19. No future office visit scheduled. Will need new order for B12 injections.

## 2021-03-04 NOTE — Addendum Note (Signed)
Addended by: Elza Rafter D on: 03/04/2021 01:19 PM   Modules accepted: Orders

## 2021-03-04 NOTE — Telephone Encounter (Signed)
Pt here in office with husband. Notified of PCP order & pt agreeable. Will plan to have lab completed today & is aware we will contact her with next steps. Pt verb understanding. MD aware.

## 2021-03-04 NOTE — Telephone Encounter (Signed)
Per verbal order of PCP, pt to have repeat B12 today & then b12 scheduled after resulted.

## 2021-03-06 ENCOUNTER — Telehealth: Payer: Self-pay | Admitting: Family Medicine

## 2021-03-06 NOTE — Telephone Encounter (Signed)
Spoke with pt reviewed llab results on her B12, verbalized understanding, pt scheduled appointment to start her B12 injection tomorrow, state that the reason she gets them is due to her diagnosis of MS. Please advise if ok for pt to continue with the B12 injections and how often she needs them.

## 2021-03-06 NOTE — Telephone Encounter (Signed)
Pt call and stated she is returning your call and would like a call back.

## 2021-03-06 NOTE — Telephone Encounter (Signed)
Advised pt of Dr Sarajane Jews recommendations to have B12 injections once a month

## 2021-03-06 NOTE — Telephone Encounter (Signed)
She should get the B12 shots once a month.

## 2021-03-07 ENCOUNTER — Ambulatory Visit: Payer: PPO

## 2021-03-18 ENCOUNTER — Telehealth: Payer: Self-pay | Admitting: Pharmacist

## 2021-03-18 NOTE — Chronic Care Management (AMB) (Signed)
Chronic Care Management Pharmacy Assistant   Name: [name]  MRN: [id number] DOB: 02/12/1966  Reason for Encounter: General Assessment   Conditions to be addressed/monitored: HTN  Recent office visits:  02/14/21 [name] RN - Patient presented for Nurse CCM visit.  Recent consult visits:  02/20/21 Alva Garnet (Neuro) - Patient presented for Multiple Sclerosis. No medication changes.  Hospital visits:  None in previous 6 months  Medications: Outpatient Encounter Medications as of 03/18/2021  Medication Sig   acetaminophen (TYLENOL) 160 MG/5ML solution Take 320 mg by mouth 2 (two) times daily as needed for mild pain. Reported on 07/21/2015   azithromycin (ZITHROMAX Z-PAK) 250 MG tablet As directed   chlorpheniramine-HYDROcodone (TUSSIONEX PENNKINETIC ER) 10-8 MG/5ML SUER Take 5 mLs by mouth every 12 (twelve) hours as needed for cough.   cholecalciferol (VITAMIN D) 1000 units tablet Take 1,000 Units by mouth daily. OTC   citalopram (CELEXA) 20 MG tablet Take 1 tablet (20 mg total) by mouth daily.   cyanocobalamin (,VITAMIN B-12,) 1000 MCG/ML injection Inject into the muscle.   cyclobenzaprine (FLEXERIL) 10 MG tablet Take 10 mg by mouth 2 (two) times daily as needed for muscle spasms.   diphenhydrAMINE (BENADRYL) 12.5 MG/5ML elixir Take 25 mg by mouth 4 (four) times daily as needed for allergies.   fluticasone (FLONASE) 50 MCG/ACT nasal spray Place into the nose.   furosemide (LASIX) 40 MG tablet Take 1 tablet (40 mg total) by mouth daily as needed for fluid.   gabapentin (NEURONTIN) 300 MG capsule Take 1 capsule (300 mg total) by mouth 3 (three) times daily.   hydrOXYzine (ATARAX/VISTARIL) 25 MG tablet TAKE 1 TABLET (25 MG TOTAL) BY MOUTH 3 (THREE) TIMES DAILY AS NEEDED FOR ITCHING.   levETIRAcetam (KEPPRA) 500 MG tablet Take 1 tablet (500 mg total) by mouth 2 (two) times daily.   levocetirizine (XYZAL) 5 MG tablet Take by mouth.   Oxcarbazepine  (TRILEPTAL) 300 MG tablet Take 1 tablet (300 mg total) by mouth 2 (two) times daily.   propranolol (INDERAL) 20 MG tablet TAKE 1 OR 2 TABLETS THE MORNINGS OF MEDICAL OR DENTAL PROCEDURES   tizanidine (ZANAFLEX) 2 MG capsule Take 1 capsule (2 mg total) by mouth 2 (two) times daily.   No facility-administered encounter medications on file as of 03/18/2021.  Reviewed chart prior to disease state call. Spoke with patient regarding BP  Recent Office Vitals: BP Readings from Last 3 Encounters:  07/15/20 120/78  03/19/20 (!) 146/112  12/19/19 (!) 142/80   Pulse Readings from Last 3 Encounters:  07/15/20 (!) 102  03/19/20 77  12/19/19 83    Wt Readings from Last 3 Encounters:  07/15/20 244 lb (110.7 kg)  03/19/20 251 lb (113.9 kg)  12/19/19 251 lb 6.4 oz (114 kg)     Kidney Function Lab Results  Component Value Date/Time   CREATININE 0.96 06/02/2018 03:09 PM   CREATININE 1.01 03/09/2018 04:22 PM   CREATININE 0.89 01/07/2017 04:07 PM   CREATININE 0.92 12/07/2016 02:18 PM   GFR 74.64 03/09/2018 04:22 PM   GFRNONAA >60 06/02/2018 03:09 PM   GFRAA >60 06/02/2018 03:09 PM    BMP Latest Ref Rng & Units 12/20/2018 06/02/2018 03/09/2018  Glucose 70 - 99 mg/dL - 91 99  BUN 4 - 21 12 13 12   Creatinine 0.44 - 1.00 mg/dL - 0.96 1.01  BUN/Creat Ratio 9 - 23 - - -  Sodium 137 - 147 141 138 138  Potassium 3.4 -  5.3 4.0 3.6 3.5  Chloride 98 - 111 mmol/L - 107 104  CO2 22 - 32 mmol/L - 22 26  Calcium 8.9 - 10.3 mg/dL - 8.9 8.8    Current antihypertensive regimen:  None Furosemide 40 mg 1 tablet daily as needed  Adherence Review: Is the patient currently on ACE/ARB medication? No Does the patient have >5 day gap between last estimated fill dates? Yes Verified (Patient uses PRN) FUROSEMIDE 40 MG TABLET 07/15/2020 90   Call to patient she reports she is doing well, no changes in her medications or needs for them, she reports her husband passed on Last 08/30/22 and his service will be on this  Friday. She thanked me for checking in with her, advised her I would touch base again in a few weeks.  Care Gaps: PNA Vaccine - Overdue Mammogram - Overdue Hepatitis C Screening- Overdue Zoster Vaccine - Overdue PAP Smear - Overdue COVID Booster #3 Therapist, music) - Overdue Flu Vaccine - Overdue AWV - Done 10/2020 CCM - 05/2021 BP-  147/102 (10/6)  Star Rating Drugs: None  Ned Clines [place] Clinical Pharmacist Assistant [phone]

## 2021-04-03 ENCOUNTER — Telehealth: Payer: Self-pay

## 2021-04-03 NOTE — Telephone Encounter (Signed)
Last OV 07/15/20 for acute concern.  Pt notified of above; appt scheduled for 2021-04-19. Of note, pt husband died unexpectedly on 2021-03-21.

## 2021-04-09 ENCOUNTER — Encounter: Payer: PPO | Admitting: Family Medicine

## 2021-04-14 ENCOUNTER — Other Ambulatory Visit: Payer: Self-pay | Admitting: Family Medicine

## 2021-04-15 ENCOUNTER — Ambulatory Visit (INDEPENDENT_AMBULATORY_CARE_PROVIDER_SITE_OTHER): Payer: PPO

## 2021-04-15 DIAGNOSIS — K2101 Gastro-esophageal reflux disease with esophagitis, with bleeding: Secondary | ICD-10-CM

## 2021-04-15 DIAGNOSIS — I10 Essential (primary) hypertension: Secondary | ICD-10-CM

## 2021-04-15 DIAGNOSIS — G35 Multiple sclerosis: Secondary | ICD-10-CM

## 2021-04-15 NOTE — Chronic Care Management (AMB) (Signed)
Chronic Care Management   CCM RN Visit Note  04/15/2021 Name: [name] MRN: [id number] DOB: Dec 01, 1965  Subjective: [name] is a 55 y.o. year old female who is a primary care patient of Laurey Morale, MD. The care management team was consulted for assistance with disease management and care coordination needs.    Engaged with patient by telephone for follow up visit in response to provider referral for case management and/or care coordination services.   Consent to Services:  The patient was given information about Chronic Care Management services, agreed to services, and gave verbal consent prior to initiation of services.  Please see initial visit note for detailed documentation.   Patient agreed to services and verbal consent obtained.   Assessment: Review of patient past medical history, allergies, medications, health status, including review of consultants reports, laboratory and other test data, was performed as part of comprehensive evaluation and provision of chronic care management services.   SDOH (Social Determinants of Health) assessments and interventions performed:    CCM Care Plan  Allergies  Allergen Reactions   Banana Swelling    Lips, tongue and face   Sulfa Antibiotics Diarrhea and Nausea And Vomiting    Syncope episode and was incoherent   Sulfacetamide Sodium Other (See Comments)    Syncope episode and was incoherent   Penicillins Diarrhea, Nausea And Vomiting and Other (See Comments)    Has patient had a PCN reaction causing immediate rash, facial/tongue/throat swelling, SOB or lightheadedness with hypotension: Yes Has patient had a PCN reaction causing severe rash involving mucus membranes or skin necrosis: No Has patient had a PCN reaction that required hospitalization No Has patient had a PCN reaction occurring within the last 10 years: No If all of the above answers are "NO", then may proceed with Cephalosporin use.  Passed  out,     Outpatient Encounter Medications as of 04/15/2021  Medication Sig   acetaminophen (TYLENOL) 160 MG/5ML solution Take 320 mg by mouth 2 (two) times daily as needed for mild pain. Reported on 07/21/2015   azithromycin (ZITHROMAX Z-PAK) 250 MG tablet As directed   chlorpheniramine-HYDROcodone (TUSSIONEX PENNKINETIC ER) 10-8 MG/5ML SUER Take 5 mLs by mouth every 12 (twelve) hours as needed for cough.   cholecalciferol (VITAMIN D) 1000 units tablet Take 1,000 Units by mouth daily. OTC   citalopram (CELEXA) 20 MG tablet Take 1 tablet (20 mg total) by mouth daily.   cyanocobalamin (,VITAMIN B-12,) 1000 MCG/ML injection Inject into the muscle.   cyclobenzaprine (FLEXERIL) 10 MG tablet Take 10 mg by mouth 2 (two) times daily as needed for muscle spasms.   diphenhydrAMINE (BENADRYL) 12.5 MG/5ML elixir Take 25 mg by mouth 4 (four) times daily as needed for allergies.   fluticasone (FLONASE) 50 MCG/ACT nasal spray Place into the nose.   furosemide (LASIX) 40 MG tablet Take 1 tablet (40 mg total) by mouth daily as needed for fluid.   gabapentin (NEURONTIN) 300 MG capsule Take 1 capsule (300 mg total) by mouth 3 (three) times daily.   hydrOXYzine (ATARAX/VISTARIL) 25 MG tablet TAKE 1 TABLET (25 MG TOTAL) BY MOUTH 3 (THREE) TIMES DAILY AS NEEDED FOR ITCHING.   levETIRAcetam (KEPPRA) 500 MG tablet Take 1 tablet (500 mg total) by mouth 2 (two) times daily.   levocetirizine (XYZAL) 5 MG tablet Take by mouth.   Oxcarbazepine (TRILEPTAL) 300 MG tablet TAKE 1 TABLET BY MOUTH 2 TIMES DAILY.   propranolol (INDERAL) 20 MG tablet TAKE 1 OR 2  TABLETS THE MORNINGS OF MEDICAL OR DENTAL PROCEDURES   tizanidine (ZANAFLEX) 2 MG capsule Take 1 capsule (2 mg total) by mouth 2 (two) times daily.   No facility-administered encounter medications on file as of 04/15/2021.    Patient Active Problem List   Diagnosis Date Noted   COVID-19 virus infection 01/07/2021   Low back pain due to bilateral sciatica 02/22/2018    Essential hypertension 05/30/2015   Right-sided chest pain 06/27/2014   Microcytic anemia 06/27/2014   Multiple sclerosis exacerbation ([place]) 06/27/2014   Obesity (BMI 30-39.9) 06/27/2014   B12 deficiency 06/06/2013   Multiple sclerosis ([place]) 08/12/2010   GERD 12/30/2006   PARESTHESIA 12/30/2006    Conditions to be addressed/monitored:HLD, GERD, and MS, seizures  Care Plan : Multiple Sclerosis (Adult)  Updates made by [name], RN since 04/15/2021 12:00 AM  Completed 04/15/2021   Problem: Lack of self management of Multiple Sclerosis Resolved 04/15/2021  Priority: High     Long-Range Goal: Effective self management of Multiple Sclerosis Completed 04/15/2021  Start Date: 11/22/2020  Expected End Date: 05/18/2021  Recent Progress: On track  Priority: High  Note:   Resolving due to duplicate goal  Current Barriers:  Ineffective Self Health Maintenance of Multiple Sclerosis with hx of seizures, HTN, GERD Unable to independently self manage MS Unable to self administer medications as prescribed due to cost Does not adhere to provider recommendations re:  Unable to perform IADLs independently: needs assist with shopping, transportation and sometimes with dressing depending if MS is flaring up Pt states that she was dx with MS in 2008.  States she goes to neurology at [facility] for treatment.  States her MS has progressed to the point where she need to have chemo.  States she has good days and bad days .  States that the hot weather has really been difficult for her this year. States she has difficulty swallowing liquids and her pills need to be crushed or in liquid form.   States she is handling her stress better and she received resources from the Education officer, museum.  States she is to see her MS doctor at [facility] in October.  States she is affording her medications and food at this time. States she had a fall Monday with no injury.  States with her MS she just sometimes goes down. Clinical  Goal(s):  Collaboration with Laurey Morale, MD regarding development and update of comprehensive plan of care as evidenced by provider attestation and co-signature Inter-disciplinary care team collaboration (see longitudinal plan of care) patient will work with care management team to address care coordination and chronic disease management needs related to Disease Management Educational Needs Care Coordination Medication Assistance  Psychosocial Support   Interventions:  Evaluation of current treatment plan related to HTN and MS, seizures, GERD , Financial constraints related to medications,housing and food, Limited access to food, Medication procurement, and ADL IADL limitations self-management and patient's adherence to plan as established by provider. Collaboration with Laurey Morale, MD regarding development and update of comprehensive plan of care as evidenced by provider attestation       and co-signature Inter-disciplinary care team collaboration (see longitudinal plan of care) Discussed plans with patient for ongoing care management follow up and provided patient with direct contact information for care management team Reinforced fall prevention and home safety  Reinforced importance of pacing her activity and to avoid activity during the heat of the day Working with LCSW for stress and difficulty coping with MS  and financial strain-referral completed Saw CCM pharmacist on 11/26/20 for concerns with cost of medications next visit 05/28/21 Self Care Activities:  Self administers medications as prescribed Attends all scheduled provider appointments Calls pharmacy for medication refills Performs ADL's independently Patient Goals: - eat healthy - get at least 8 hours of sleep at night - keep the bedroom cool and dark - maintain a healthy weight - stop using electronic devices like cell phone or video game when getting ready for sleep - use a fan or white noise in bedroom - use  cooling vest - use devices that will help like a cane, sock-puller or reacher - use meditation or relaxation techniques - add more outdoor lighting - always use handrails on the stairs - always wear shoes or slippers with non-slip sole - install bathroom grab bars - keep a flashlight by the bed - keep cell phone with me always - make an emergency alert plan in case I fall - pick up clutter from the floors - use a cane or walker - use a nightlight in the bathroom, halls - use a nonslip pad with throw rugs, or remove them completely - always wear low-heeled or flat shoes or slippers with nonskid soles Follow Up Plan: Telephone follow up appointment with care management team member scheduled for: 02/14/21 at 2 PM The patient has been provided with contact information for the care management team and has been advised to call with any health related questions or concerns.      Care Plan : Hypertension (Adult)  Updates made by [name], RN since 04/15/2021 12:00 AM  Completed 04/15/2021   Problem: Lack of self managment of Hypertension (Hypertension) Resolved 04/15/2021  Priority: Medium     Long-Range Goal: Effective self management of Hypertension Completed 04/15/2021  Start Date: 11/22/2020  Expected End Date: 04/17/2021  Recent Progress: On track  Priority: Medium  Note:   Resolving due to duplicate goal  Objective:  Last practice recorded BP readings:  BP Readings from Last 3 Encounters:  07/15/20 120/78  03/19/20 (!) 146/112  12/19/19 (!) 142/80   Most recent eGFR/CrCl: No results found for: EGFR  No components found for: CRCL Current Barriers:  Knowledge Deficits related to basic understanding of hypertension pathophysiology and self care management Knowledge Deficits related to understanding of medications prescribed for management of hypertension Difficulty obtaining medications Financial Constraints.  Unable to independently self manage HTN Does not adhere to  prescribed medication regimen Pt states that her B/P has been good when she has had it checked. States she tries to follow a low salt diet but they do eat take out frequently Case Manager Clinical Goal(s):  patient will verbalize understanding of plan for hypertension management patient will attend all scheduled medical appointments:  [place] neurology 10/22 patient will demonstrate improved health management independence as evidenced by checking blood pressure as directed and notifying PCP if SBP>160 or DBP > 90, taking all medications as prescribe, and adhering to a low sodium diet as discussed. Interventions:  Collaboration with Laurey Morale, MD regarding development and update of comprehensive plan of care as evidenced by provider attestation and co-signature Inter-disciplinary care team collaboration (see longitudinal plan of care) Evaluation of current treatment plan related to hypertension self management and patient's adherence to plan as established by provider. Reinforced education on  stroke prevention, s/s of heart attack and stroke, DASH diet, complications of uncontrolled blood pressure Reviewed medications with patient and discussed importance of compliance Discussed plans with patient  for ongoing care management follow up and provided patient with direct contact information for care management team Reinforced to monitor blood pressure daily and record, calling PCP for findings outside established parameters.  Reviewed scheduled/upcoming provider appointments including: [place] neurology 10/22 Self-Care Activities: - Self administers medications as prescribed Attends all scheduled provider appointments Calls provider office for new concerns, questions, or BP outside discussed parameters Checks BP and records as discussed Follows a low sodium diet/DASH diet Patient Goals: - check blood pressure 3 times per week - choose a place to take my blood pressure (home, clinic or office,  retail store) - write blood pressure results in a log or diary - ask questions to understand  Follow Up Plan: Telephone follow up appointment with care management team member scheduled for: 04/15/21 at 3:30 PM The patient has been provided with contact information for the care management team and has been advised to call with any health related questions or concerns.      Care Plan : RN Care Manager Plan of Care  Updates made by [name], RN since 04/15/2021 12:00 AM     Problem: Chronic Disease Management and Care Coordination Needs (MS,HTN, GERD, seizures)   Priority: High     Long-Range Goal: Establish Plan of Care for Chronic Disease Management Needs (MS,HTN, GERD, seizures)   Start Date: 04/15/2021  Expected End Date: 10/12/2021  Priority: High  Note:   Current Barriers:  Care Coordination needs related to Limited social support and ADL IADL limitations Chronic Disease Management support and education needs related to HTN, GERD, and MS,seizures  Lacks caregiver support Pt states that her husband died suddenly last month.  States she is still trying to process his loss. States she has a grief counseling group at her church that she can use when she feels she is ready and declines speaking with CCM LCSW at this time. States her sister and other family members have been helping her with shopping and transportation.  States she is taking her medications as ordered.  States her MS has been worse due to the stress of losing her husband.  States she saw her neurologist at [place] last month.  States she has frequent falls and uses her cane.  States she is trying to eat healthy   RNCM Clinical Goal(s):  Patient will verbalize basic understanding of  HTN, GERD, and MS,seizures  disease process and self health management plan as evidenced by voiced adherence to plan of care take all medications exactly as prescribed and will call provider for medication related questions as evidenced by  dispense report and pt verbalization attend all scheduled medical appointments: CCM PharmD 06/08/21 as evidenced by review of medical records demonstrate Improved adherence to prescribed treatment plan for HTN, GERD, and MS,seizures  as evidenced by readings within limits, voiced adherence to plan of care continue to work with RN Care Manager to address care management and care coordination needs related to  CAD, GERD, and MS,seizures  as evidenced by adherence to CM Team Scheduled appointments through collaboration with RN Care manager, provider, and care team.   Interventions: 1:1 collaboration with primary care provider regarding development and update of comprehensive plan of care as evidenced by provider attestation and co-signature Inter-disciplinary care team collaboration (see longitudinal plan of care) Evaluation of current treatment plan related to  self management and patient's adherence to plan as established by provider Reviewed benefits of working with LCSW for grief counseling, declines at this time   Falls Interventions:  (  Status:  Goal on track:  Yes.) Long Term Goal Reviewed medications and discussed potential side effects of medications such as dizziness and frequent urination Advised patient of importance of notifying provider of falls Assessed for signs and symptoms of orthostatic hypotension Assessed for falls since last encounter Assessed patients knowledge of fall risk prevention secondary to previously provided education   MS  (Status:  Goal on track:  Yes.)  Long Term Goal Evaluation of current treatment plan related to  MS , Limited social support and ADL IADL limitations self-management and patient's adherence to plan as established by provider. Discussed plans with patient for ongoing care management follow up and provided patient with direct contact information for care management team Evaluation of current treatment plan related to MS and patient's adherence to  plan as established by provider Reviewed medications with patient and discussed adherence Discussed plans with patient for ongoing care management follow up and provided patient with direct contact information for care management team Assessed social determinant of health barriers  Hypertension Interventions:  (Status:  Goal on track:  Yes.) Long Term Goal Last practice recorded BP readings:  BP Readings from Last 3 Encounters:  07/15/20 120/78  03/19/20 (!) 146/112  12/19/19 (!) 142/80  Most recent eGFR/CrCl: No results found for: EGFR  No components found for: CRCL  Evaluation of current treatment plan related to hypertension self management and patient's adherence to plan as established by provider Provided education to patient re: stroke prevention, s/s of heart attack and stroke Reviewed medications with patient and discussed importance of compliance Advised patient, providing education and rationale, to monitor blood pressure daily and record, calling PCP for findings outside established parameters Provided education on prescribed diet low sodium  Patient Goals/Self-Care Activities: Take all medications as prescribed Attend all scheduled provider appointments Call pharmacy for medication refills 3-7 days in advance of running out of medications Perform all self care activities independently  Call provider office for new concerns or questions  check blood pressure weekly write blood pressure results in a log or diary call doctor for signs and symptoms of high blood pressure eat more whole grains, fruits and vegetables, lean meats and healthy fats limit salt intake to 2354m/day - eat healthy - get at least 8 hours of sleep at night - keep the bedroom cool and dark - maintain a healthy weight - stop using electronic devices like cell phone or video game when getting ready for sleep - use a fan or white noise in bedroom - use cooling vest - use devices that will help like a  cane, sock-puller or reacher - use meditation or relaxation techniques  Follow Up Plan:  Telephone follow up appointment with care management team member scheduled for:  05/06/21 The patient has been provided with contact information for the care management team and has been advised to call with any health related questions or concerns.       Plan:Telephone follow up appointment with care management team member scheduled for:  05/06/21 The patient has been provided with contact information for the care management team and has been advised to call with any health related questions or concerns.  MPeter GarterRN, BJackquline Denmark CDE Care Management Coordinator [facility] ([phone] Mobile [phone]

## 2021-04-15 NOTE — Patient Instructions (Signed)
Visit Information  Thank you for taking time to visit with me today. Please don't hesitate to contact me if I can be of assistance to you before our next scheduled telephone appointment.  Following are the goals we discussed today:  Take all medications as prescribed Attend all scheduled provider appointments Call pharmacy for medication refills 3-7 days in advance of running out of medications Perform all self care activities independently  Call provider office for new concerns or questions  check blood pressure weekly write blood pressure results in a log or diary call doctor for signs and symptoms of high blood pressure eat more whole grains, fruits and vegetables, lean meats and healthy fats limit salt intake to 2300mg /day eat healthy - get at least 8 hours of sleep at night - keep the bedroom cool and dark - maintain a healthy weight - stop using electronic devices like cell phone or video game when getting ready for sleep - use a fan or white noise in bedroom - use cooling vest - use devices that will help like a cane, sock-puller or reacher - use meditation or relaxation techniques  Our next appointment is by telephone on 05/06/21 at 2:15 PM  Please call the care guide team at [phone] if you need to cancel or reschedule your appointment.   If you are experiencing a Mental Health or [place] or need someone to talk to, please call the Suicide and Crisis Lifeline: 988 call 1-800-273-TALK (toll free, 24 hour hotline) go to [facility] [address], [place] [phone]) call 911   The patient verbalized understanding of instructions, educational materials, and care plan provided today and declined offer to receive copy of patient instructions, educational materials, and care plan.  Peter Garter RN, Jackquline Denmark, CDE Care Management Coordinator [facility] [phone], Mobile [phone]

## 2021-04-16 DIAGNOSIS — I10 Essential (primary) hypertension: Secondary | ICD-10-CM | POA: Diagnosis not present

## 2021-04-16 DIAGNOSIS — G35 Multiple sclerosis: Secondary | ICD-10-CM | POA: Diagnosis not present

## 2021-05-06 ENCOUNTER — Ambulatory Visit (INDEPENDENT_AMBULATORY_CARE_PROVIDER_SITE_OTHER): Payer: PPO

## 2021-05-06 DIAGNOSIS — K2101 Gastro-esophageal reflux disease with esophagitis, with bleeding: Secondary | ICD-10-CM

## 2021-05-06 DIAGNOSIS — I10 Essential (primary) hypertension: Secondary | ICD-10-CM

## 2021-05-06 DIAGNOSIS — G35 Multiple sclerosis: Secondary | ICD-10-CM

## 2021-05-06 NOTE — Chronic Care Management (AMB) (Signed)
Chronic Care Management   CCM RN Visit Note  05/06/2021 Name: [name] MRN: [id number] DOB: August 14, 1965  Subjective: [name] is a 55 y.o. year old female who is a primary care patient of Laurey Morale, MD. The care management team was consulted for assistance with disease management and care coordination needs.    Engaged with patient by telephone for follow up visit in response to provider referral for case management and/or care coordination services.   Consent to Services:  The patient was given information about Chronic Care Management services, agreed to services, and gave verbal consent prior to initiation of services.  Please see initial visit note for detailed documentation.   Patient agreed to services and verbal consent obtained.   Assessment: Review of patient past medical history, allergies, medications, health status, including review of consultants reports, laboratory and other test data, was performed as part of comprehensive evaluation and provision of chronic care management services.   SDOH (Social Determinants of Health) assessments and interventions performed:  SDOH Interventions    Flowsheet Row Most Recent Value  SDOH Interventions   Food Insecurity Interventions Intervention Not Indicated  Housing Interventions Intervention Not Indicated        CCM Care Plan  Allergies  Allergen Reactions   Banana Swelling    Lips, tongue and face   Sulfa Antibiotics Diarrhea and Nausea And Vomiting    Syncope episode and was incoherent   Sulfacetamide Sodium Other (See Comments)    Syncope episode and was incoherent   Penicillins Diarrhea, Nausea And Vomiting and Other (See Comments)    Has patient had a PCN reaction causing immediate rash, facial/tongue/throat swelling, SOB or lightheadedness with hypotension: Yes Has patient had a PCN reaction causing severe rash involving mucus membranes or skin necrosis: No Has patient had a PCN reaction  that required hospitalization No Has patient had a PCN reaction occurring within the last 10 years: No If all of the above answers are "NO", then may proceed with Cephalosporin use.  Passed out,     Outpatient Encounter Medications as of 05/06/2021  Medication Sig   acetaminophen (TYLENOL) 160 MG/5ML solution Take 320 mg by mouth 2 (two) times daily as needed for mild pain. Reported on 07/21/2015   azithromycin (ZITHROMAX Z-PAK) 250 MG tablet As directed   chlorpheniramine-HYDROcodone (TUSSIONEX PENNKINETIC ER) 10-8 MG/5ML SUER Take 5 mLs by mouth every 12 (twelve) hours as needed for cough.   cholecalciferol (VITAMIN D) 1000 units tablet Take 1,000 Units by mouth daily. OTC   citalopram (CELEXA) 20 MG tablet Take 1 tablet (20 mg total) by mouth daily.   cyanocobalamin (,VITAMIN B-12,) 1000 MCG/ML injection Inject into the muscle.   cyclobenzaprine (FLEXERIL) 10 MG tablet Take 10 mg by mouth 2 (two) times daily as needed for muscle spasms.   diphenhydrAMINE (BENADRYL) 12.5 MG/5ML elixir Take 25 mg by mouth 4 (four) times daily as needed for allergies.   fluticasone (FLONASE) 50 MCG/ACT nasal spray Place into the nose.   furosemide (LASIX) 40 MG tablet Take 1 tablet (40 mg total) by mouth daily as needed for fluid.   gabapentin (NEURONTIN) 300 MG capsule Take 1 capsule (300 mg total) by mouth 3 (three) times daily.   hydrOXYzine (ATARAX/VISTARIL) 25 MG tablet TAKE 1 TABLET (25 MG TOTAL) BY MOUTH 3 (THREE) TIMES DAILY AS NEEDED FOR ITCHING.   levETIRAcetam (KEPPRA) 500 MG tablet Take 1 tablet (500 mg total) by mouth 2 (two) times daily.   levocetirizine (  XYZAL) 5 MG tablet Take by mouth.   Oxcarbazepine (TRILEPTAL) 300 MG tablet TAKE 1 TABLET BY MOUTH 2 TIMES DAILY.   propranolol (INDERAL) 20 MG tablet TAKE 1 OR 2 TABLETS THE MORNINGS OF MEDICAL OR DENTAL PROCEDURES   tizanidine (ZANAFLEX) 2 MG capsule Take 1 capsule (2 mg total) by mouth 2 (two) times daily.   No facility-administered  encounter medications on file as of 05/06/2021.    Patient Active Problem List   Diagnosis Date Noted   COVID-19 virus infection 01/07/2021   Low back pain due to bilateral sciatica 02/22/2018   Essential hypertension 05/30/2015   Right-sided chest pain 06/27/2014   Microcytic anemia 06/27/2014   Multiple sclerosis exacerbation ([place]) 06/27/2014   Obesity (BMI 30-39.9) 06/27/2014   B12 deficiency 06/06/2013   Multiple sclerosis ([place]) 08/12/2010   GERD 12/30/2006   PARESTHESIA 12/30/2006    Conditions to be addressed/monitored:HTN, GERD, and MS  Care Plan : RN Care Manager Plan of Care  Updates made by [name], RN since 05/06/2021 12:00 AM     Problem: Chronic Disease Management and Care Coordination Needs (MS,HTN, GERD, seizures)   Priority: High     Long-Range Goal: Establish Plan of Care for Chronic Disease Management Needs (MS,HTN, GERD, seizures)   Start Date: 04/15/2021  Expected End Date: 10/12/2021  Priority: High  Note:   Current Barriers:  Care Coordination needs related to Limited social support and ADL IADL limitations Chronic Disease Management support and education needs related to HTN, GERD, and MS,seizures  Lacks caregiver support States she is still grieving her husbands death but she is slowly getting over the shock.  States she has a grief counseling group at her church that she plans to go to after the first of the year and declines speaking with CCM LCSW at this time. States her sister and other family members have been helping her with shopping and transportation.  States she is taking her medications as ordered.  States her MS has been worse due to the stress of losing her husband.  States she saw her neurologist at [facility] October.  States she has frequent falls and uses her cane. States she keeps her phone with her all of the time. States she is trying to eat healthy and her stomach issues are gone.  RNCM Clinical Goal(s):  Patient will verbalize  basic understanding of  HTN, GERD, and MS,seizures  disease process and self health management plan as evidenced by voiced adherence to plan of care take all medications exactly as prescribed and will call provider for medication related questions as evidenced by dispense report and pt verbalization attend all scheduled medical appointments: CCM PharmD 05/28/21 as evidenced by review of medical records demonstrate Improved adherence to prescribed treatment plan for HTN, GERD, and MS,seizures  as evidenced by readings within limits, voiced adherence to plan of care continue to work with RN Care Manager to address care management and care coordination needs related to  CAD, GERD, and MS,seizures  as evidenced by adherence to CM Team Scheduled appointments through collaboration with RN Care manager, provider, and care team.   Interventions: 1:1 collaboration with primary care provider regarding development and update of comprehensive plan of care as evidenced by provider attestation and co-signature Inter-disciplinary care team collaboration (see longitudinal plan of care) Evaluation of current treatment plan related to  self management and patient's adherence to plan as established by provider Reinforced benefits of working with LCSW for grief counseling, declines at this time  Falls Interventions:  (Status:  Goal on track:  Yes.) Long Term Goal Reviewed medications and discussed potential side effects of medications such as dizziness and frequent urination Advised patient of importance of notifying provider of falls Assessed for signs and symptoms of orthostatic hypotension Assessed for falls since last encounter Assessed patients knowledge of fall risk prevention secondary to previously provided education Reviewed to keep her phone with her at all times    MS  (Status:  Goal on track:  Yes.)  Long Term Goal Evaluation of current treatment plan related to  MS , Limited social support and ADL  IADL limitations self-management and patient's adherence to plan as established by provider. Discussed plans with patient for ongoing care management follow up and provided patient with direct contact information for care management team Evaluation of current treatment plan related to MS and patient's adherence to plan as established by provider Reviewed medications with patient and discussed adherence Discussed plans with patient for ongoing care management follow up and provided patient with direct contact information for care management team Assessed social determinant of health barriers Reviewed ways to help with generalized pain of MS, to get adequate rest and to not get over heated  Hypertension Interventions:  (Status:  Goal on track:  Yes.) Long Term Goal Last practice recorded BP readings:  BP Readings from Last 3 Encounters:  07/15/20 120/78  03/19/20 (!) 146/112  12/19/19 (!) 142/80  Most recent eGFR/CrCl: No results found for: EGFR  No components found for: CRCL  Evaluation of current treatment plan related to hypertension self management and patient's adherence to plan as established by provider Provided education to patient re: stroke prevention, s/s of heart attack and stroke Reviewed medications with patient and discussed importance of compliance Provided education on prescribed diet low sodium  Patient Goals/Self-Care Activities: Take all medications as prescribed Attend all scheduled provider appointments Call pharmacy for medication refills 3-7 days in advance of running out of medications Perform all self care activities independently  Call provider office for new concerns or questions  check blood pressure weekly write blood pressure results in a log or diary call doctor for signs and symptoms of high blood pressure eat more whole grains, fruits and vegetables, lean meats and healthy fats limit salt intake to 2332m/day - eat healthy - get at least 8 hours of  sleep at night - keep the bedroom cool and dark - maintain a healthy weight - stop using electronic devices like cell phone or video game when getting ready for sleep - use a fan or white noise in bedroom - use cooling vest - use devices that will help like a cane, sock-puller or reacher - use meditation or relaxation techniques  Follow Up Plan:  Telephone follow up appointment with care management team member scheduled for:  07/11/21 The patient has been provided with contact information for the care management team and has been advised to call with any health related questions or concerns.       Plan:Telephone follow up appointment with care management team member scheduled for:  07/11/21 The patient has been provided with contact information for the care management team and has been advised to call with any health related questions or concerns.  MPeter GarterRN, BJackquline Denmark CDE Care Management Coordinator [facility] [phone] Mobile ([phone]

## 2021-05-06 NOTE — Patient Instructions (Signed)
Visit Information  Thank you for taking time to visit with me today. Please don't hesitate to contact me if I can be of assistance to you before our next scheduled telephone appointment.  Following are the goals we discussed today:  Take all medications as prescribed Attend all scheduled provider appointments Call pharmacy for medication refills 3-7 days in advance of running out of medications Perform all self care activities independently  Call provider office for new concerns or questions  check blood pressure weekly write blood pressure results in a log or diary call doctor for signs and symptoms of high blood pressure eat more whole grains, fruits and vegetables, lean meats and healthy fats limit salt intake to 2300mg /day  eat healthy - get at least 8 hours of sleep at night - keep the bedroom cool and dark - maintain a healthy weight - stop using electronic devices like cell phone or video game when getting ready for sleep - use a fan or white noise in bedroom - use cooling vest - use devices that will help like a cane, sock-puller or reacher - use meditation or relaxation techniques  Our next appointment is by telephone on 07/11/21 at 2:15 PM  Please call the care guide team at [phone] if you need to cancel or reschedule your appointment.   If you are experiencing a Mental Health or [place] or need someone to talk to, please call the Suicide and Crisis Lifeline: 988 call the [place] National Suicide Prevention Lifeline: 337-511-5152 or TTY: 704-811-4629 TTY [phone]) to talk to a trained counselor call 1-800-273-TALK (toll free, 24 hour hotline) go to [facility] [place] [phone]) call 911   The patient verbalized understanding of instructions, educational materials, and care plan provided today and declined offer to receive copy of patient instructions, educational materials, and care plan.   Peter Garter RN, Jackquline Denmark, CDE Care Management Coordinator [facility] [phone], Mobile [phone]

## 2021-05-09 ENCOUNTER — Telehealth: Payer: PPO

## 2021-05-17 DIAGNOSIS — I10 Essential (primary) hypertension: Secondary | ICD-10-CM

## 2021-05-21 ENCOUNTER — Ambulatory Visit: Payer: PPO | Admitting: Family Medicine

## 2021-05-26 ENCOUNTER — Ambulatory Visit: Payer: PPO | Admitting: Family Medicine

## 2021-05-26 ENCOUNTER — Telehealth: Payer: Self-pay | Admitting: Pharmacist

## 2021-05-26 NOTE — Chronic Care Management (AMB) (Signed)
° ° °  Chronic Care Management Pharmacy Assistant   Name: [name]  MRN: [id number] DOB: 01-Jul-1965  05/26/21 APPOINTMENT REMINDER  Attempt was made for reminder phone numbers in chart are not active  Care Gaps: PNA Vaccine - Overdue Hep C. Screening - Overdue Zoster Vaccine - Overdue PAP Smear - Overdue COVID Booster - Overdue Flu Vaccine - Overdue BP- 120/78 (2/22) AWV- 6/22  Star Rating Drug: None   Any gaps in medications fill history? None     Medications: Outpatient Encounter Medications as of 05/26/2021  Medication Sig   acetaminophen (TYLENOL) 160 MG/5ML solution Take 320 mg by mouth 2 (two) times daily as needed for mild pain. Reported on 07/21/2015   azithromycin (ZITHROMAX Z-PAK) 250 MG tablet As directed   chlorpheniramine-HYDROcodone (TUSSIONEX PENNKINETIC ER) 10-8 MG/5ML SUER Take 5 mLs by mouth every 12 (twelve) hours as needed for cough.   cholecalciferol (VITAMIN D) 1000 units tablet Take 1,000 Units by mouth daily. OTC   citalopram (CELEXA) 20 MG tablet Take 1 tablet (20 mg total) by mouth daily.   cyanocobalamin (,VITAMIN B-12,) 1000 MCG/ML injection Inject into the muscle.   cyclobenzaprine (FLEXERIL) 10 MG tablet Take 10 mg by mouth 2 (two) times daily as needed for muscle spasms.   diphenhydrAMINE (BENADRYL) 12.5 MG/5ML elixir Take 25 mg by mouth 4 (four) times daily as needed for allergies.   fluticasone (FLONASE) 50 MCG/ACT nasal spray Place into the nose.   furosemide (LASIX) 40 MG tablet Take 1 tablet (40 mg total) by mouth daily as needed for fluid.   gabapentin (NEURONTIN) 300 MG capsule Take 1 capsule (300 mg total) by mouth 3 (three) times daily.   hydrOXYzine (ATARAX/VISTARIL) 25 MG tablet TAKE 1 TABLET (25 MG TOTAL) BY MOUTH 3 (THREE) TIMES DAILY AS NEEDED FOR ITCHING.   levETIRAcetam (KEPPRA) 500 MG tablet Take 1 tablet (500 mg total) by mouth 2 (two) times daily.   levocetirizine (XYZAL) 5 MG tablet Take by mouth.   Oxcarbazepine  (TRILEPTAL) 300 MG tablet TAKE 1 TABLET BY MOUTH 2 TIMES DAILY.   propranolol (INDERAL) 20 MG tablet TAKE 1 OR 2 TABLETS THE MORNINGS OF MEDICAL OR DENTAL PROCEDURES   tizanidine (ZANAFLEX) 2 MG capsule Take 1 capsule (2 mg total) by mouth 2 (two) times daily.   No facility-administered encounter medications on file as of 05/26/2021.      [place] Clinical Pharmacist Assistant [phone]

## 2021-05-27 ENCOUNTER — Encounter: Payer: Self-pay | Admitting: Family Medicine

## 2021-05-27 ENCOUNTER — Ambulatory Visit (INDEPENDENT_AMBULATORY_CARE_PROVIDER_SITE_OTHER): Payer: PPO | Admitting: Family Medicine

## 2021-05-27 VITALS — BP 160/92 | HR 92 | Temp 98.7°F | Wt 248.0 lb

## 2021-05-27 DIAGNOSIS — R200 Anesthesia of skin: Secondary | ICD-10-CM

## 2021-05-27 DIAGNOSIS — R202 Paresthesia of skin: Secondary | ICD-10-CM

## 2021-05-27 DIAGNOSIS — E538 Deficiency of other specified B group vitamins: Secondary | ICD-10-CM

## 2021-05-27 DIAGNOSIS — R739 Hyperglycemia, unspecified: Secondary | ICD-10-CM

## 2021-05-27 DIAGNOSIS — R609 Edema, unspecified: Secondary | ICD-10-CM

## 2021-05-27 LAB — HEPATIC FUNCTION PANEL
ALT: 12 U/L (ref 0–35)
AST: 15 U/L (ref 0–37)
Albumin: 4.2 g/dL (ref 3.5–5.2)
Alkaline Phosphatase: 109 U/L (ref 39–117)
Bilirubin, Direct: 0 mg/dL (ref 0.0–0.3)
Total Bilirubin: 0.4 mg/dL (ref 0.2–1.2)
Total Protein: 7.6 g/dL (ref 6.0–8.3)

## 2021-05-27 LAB — BASIC METABOLIC PANEL
BUN: 12 mg/dL (ref 6–23)
CO2: 26 mEq/L (ref 19–32)
Calcium: 9.2 mg/dL (ref 8.4–10.5)
Chloride: 105 mEq/L (ref 96–112)
Creatinine, Ser: 0.91 mg/dL (ref 0.40–1.20)
GFR: 72.22 mL/min (ref >60.00)
Glucose, Bld: 87 mg/dL (ref 70–99)
Potassium: 4 mEq/L (ref 3.5–5.1)
Sodium: 138 mEq/L (ref 135–145)

## 2021-05-27 LAB — BRAIN NATRIURETIC PEPTIDE: Pro B Natriuretic peptide (BNP): 6 pg/mL (ref 0.0–100.0)

## 2021-05-27 LAB — HEMOGLOBIN A1C: Hgb A1c MFr Bld: 6.1 % (ref 4.6–6.5)

## 2021-05-27 LAB — CBC WITH DIFFERENTIAL/PLATELET
Basophils Absolute: 0 10*3/uL (ref 0.0–0.1)
Basophils Relative: 0.6 % (ref 0.0–3.0)
Eosinophils Absolute: 0.3 10*3/uL (ref 0.0–0.7)
Eosinophils Relative: 4.5 % (ref 0.0–5.0)
HCT: 38.5 % (ref 36.0–46.0)
Hemoglobin: 12.4 g/dL (ref 12.0–15.0)
Lymphocytes Relative: 28.5 % (ref 12.0–46.0)
Lymphs Abs: 1.7 10*3/uL (ref 0.7–4.0)
MCHC: 32.1 g/dL (ref 30.0–36.0)
MCV: 81.9 fl (ref 78.0–100.0)
Monocytes Absolute: 0.4 10*3/uL (ref 0.1–1.0)
Monocytes Relative: 7.3 % (ref 3.0–12.0)
Neutro Abs: 3.5 10*3/uL (ref 1.4–7.7)
Neutrophils Relative %: 59.1 % (ref 43.0–77.0)
Platelets: 178 10*3/uL (ref 150.0–400.0)
RBC: 4.71 Mil/uL (ref 3.87–5.11)
RDW: 14.7 % (ref 11.5–15.5)
WBC: 5.8 10*3/uL (ref 4.0–10.5)

## 2021-05-27 LAB — TSH: TSH: 2.69 u[IU]/mL (ref 0.35–5.50)

## 2021-05-27 MED ORDER — CYANOCOBALAMIN 1000 MCG/ML IJ SOLN
1000.0000 ug | Freq: Once | INTRAMUSCULAR | Status: AC
  Administered 2021-05-27: 1000 ug via INTRAMUSCULAR

## 2021-05-27 MED ORDER — METHYLPREDNISOLONE ACETATE 80 MG/ML IJ SUSP
80.0000 mg | Freq: Once | INTRAMUSCULAR | Status: AC
  Administered 2021-05-27: 80 mg via INTRAMUSCULAR

## 2021-05-27 MED ORDER — METHYLPREDNISOLONE ACETATE 40 MG/ML IJ SUSP
40.0000 mg | Freq: Once | INTRAMUSCULAR | Status: AC
  Administered 2021-05-27: 40 mg via INTRAMUSCULAR

## 2021-05-27 MED ORDER — FUROSEMIDE 40 MG PO TABS: 40.0000 mg | ORAL_TABLET | Freq: Every day | ORAL | 3 refills | Status: DC

## 2021-05-27 NOTE — Addendum Note (Signed)
Addended by: Wyvonne Lenz on: 05/27/2021 04:41 PM   Modules accepted: Orders

## 2021-05-27 NOTE — Progress Notes (Signed)
Chronic Care Management Pharmacy Note  05/28/2021 Name:  [name] MRN:  [id number] DOB:  1965/08/28  Summary: Pt is stressed with death of husband and reports she may be having an MS flare up  Recommendations/Changes made from today's visit: -Recommended routine BP monitoring at home given previous history of elevated BP -Recommended follow up with neurology about MS flare up -Sent message to CCM social worker for grief counseling  Plan: BP assessment in 1 month  Subjective: [name] is an 56 y.o. year old female who is a primary patient of Laurey Morale, MD.  The CCM team was consulted for assistance with disease management and care coordination needs.    Engaged with patient by telephone for follow up visit in response to provider referral for pharmacy case management and/or care coordination services.   Consent to Services:  The patient was given information about Chronic Care Management services, agreed to services, and gave verbal consent prior to initiation of services.  Please see initial visit note for detailed documentation.   Patient Care Team: Laurey Morale, MD as PCP - General (Family Medicine) [name], MD as Referring Physician (Neurology) [name], RN as Case Manager [name], [employer] as Pharmacist (Pharmacist)  Recent office visits: 05/27/21 Laurey Morale, MD: Patient was seen for edema. Prescribed Lasix 40 mg every morning.  01/07/21 Laurey Morale, MD - Patient presented for COVID-19 virus infection. Prescribed Azithromycin and Chlorpheniramine-HYDROcodone Stopped Famotidine, Ferrous Sulfate, Pantoprazole and Sucralfate.   01/07/21 Saporito, Maree Erie, LCSW - Patient presented for Multiple Sclerosis and other concerns. No medication changes.  Recent consult visits: 02/20/21 Alva Garnet (Neuro) - Patient presented for Multiple Sclerosis. No medication changes.  09/24/20 Junita Push, PA-C  (gastroenterology): Patient presented for initial consult for erosive esophagitis. Plan for EGD.  Hospital visits: None in previous 6 months  Objective:  Lab Results  Component Value Date   CREATININE 0.91 05/27/2021   BUN 12 05/27/2021   GFR 72.22 05/27/2021   GFRNONAA >60 06/02/2018   GFRAA >60 06/02/2018   NA 138 05/27/2021   K 4.0 05/27/2021   CALCIUM 9.2 05/27/2021   CO2 26 05/27/2021   GLUCOSE 87 05/27/2021    Lab Results  Component Value Date/Time   HGBA1C 6.1 05/27/2021 02:57 PM   HGBA1C 6.1 (H) 10/17/2012 01:55 PM   GFR 72.22 05/27/2021 02:57 PM   GFR 74.64 03/09/2018 04:22 PM    Last diabetic Eye exam: No results found for: HMDIABEYEEXA  Last diabetic Foot exam: No results found for: HMDIABFOOTEX   Lab Results  Component Value Date   CHOL 216 (H) 11/09/2016   HDL 63.00 11/09/2016   LDLCALC 137 (H) 11/09/2016   TRIG 79.0 11/09/2016   CHOLHDL 3 11/09/2016    Hepatic Function Latest Ref Rng & Units 05/27/2021 12/20/2018 03/09/2018  Total Protein 6.0 - 8.3 g/dL 7.6 - 6.8  Albumin 3.5 - 5.2 g/dL 4.2 - 4.0  AST 0 - 37 U/L $Remo'15 15 9  'zpOVe$ ALT 0 - 35 U/L $Remo'12 10 8  'Mylyz$ Alk Phosphatase 39 - 117 U/L 109 107 83  Total Bilirubin 0.2 - 1.2 mg/dL 0.4 - 0.3  Bilirubin, Direct 0.0 - 0.3 mg/dL 0.0 - -    Lab Results  Component Value Date/Time   TSH 2.69 05/27/2021 02:57 PM   TSH 2.112 06/02/2018 06:57 PM   TSH 2.18 01/22/2017 03:03 PM   FREET4 0.56 (L) 01/22/2017 03:03 PM   FREET4 0.53 (L)  11/09/2016 11:03 AM    CBC Latest Ref Rng & Units 05/27/2021 12/20/2018 06/02/2018  WBC 4.0 - 10.5 K/uL 5.8 6.4 6.8  Hemoglobin 12.0 - 15.0 g/dL 12.4 11.9(A) 13.2  Hematocrit 36.0 - 46.0 % 38.5 37 43.1  Platelets 150.0 - 400.0 K/uL 178.0 226 266    Lab Results  Component Value Date/Time   VD25OH 12.63 (L) 11/09/2016 11:03 AM    Clinical ASCVD: No  The ASCVD Risk score (Arnett DK, et al., 2019) failed to calculate for the following reasons:   Cannot find a previous HDL lab   Cannot  find a previous total cholesterol lab    Depression screen [facility] 2/9 12/12/2020 11/22/2020 11/12/2020  Decreased Interest 0 0 1  Down, Depressed, Hopeless 1 0 0  PHQ - 2 Score 1 0 1  Some recent data might be hidden      Social History   Tobacco Use  Smoking Status Never   Passive exposure: Past  Smokeless Tobacco Never   BP Readings from Last 3 Encounters:  05/27/21 (!) 160/92  07/15/20 120/78  03/19/20 (!) 146/112   Pulse Readings from Last 3 Encounters:  05/27/21 92  07/15/20 (!) 102  03/19/20 77   Wt Readings from Last 3 Encounters:  05/27/21 248 lb (112.5 kg)  07/15/20 244 lb (110.7 kg)  03/19/20 251 lb (113.9 kg)   BMI Readings from Last 3 Encounters:  05/27/21 43.94 kg/m  07/15/20 43.23 kg/m  03/19/20 44.47 kg/m    Assessment/Interventions: Review of patient past medical history, allergies, medications, health status, including review of consultants reports, laboratory and other test data, was performed as part of comprehensive evaluation and provision of chronic care management services.   SDOH:  (Social Determinants of Health) assessments and interventions performed: Yes  SDOH Screenings   Alcohol Screen: Low Risk    Last Alcohol Screening Score (AUDIT): 0  Depression (PHQ2-9): Low Risk    PHQ-2 Score: 1  Financial Resource Strain: High Risk   Difficulty of Paying Living Expenses: Hard  Food Insecurity: No Food Insecurity   Worried About Charity fundraiser in the Last Year: Never true   Ran Out of Food in the Last Year: Never true  Housing: Low Risk    Last Housing Risk Score: 0  Physical Activity: Insufficiently Active   Days of Exercise per Week: 3 days   Minutes of Exercise per Session: 30 min  Social Connections: Moderately Isolated   Frequency of Communication with Friends and Family: More than three times a week   Frequency of Social Gatherings with Friends and Family: More than three times a week   Attends Religious Services: Never   Building surveyor or Organizations: No   Attends Music therapist: Never   Marital Status: Married  Stress: Stress Concern Present   Feeling of Stress : Rather much  Tobacco Use: Low Risk    Smoking Tobacco Use: Never   Smokeless Tobacco Use: Never   Passive Exposure: Past  Transportation Needs: No Transportation Needs   Lack of Transportation (Medical): No   Lack of Transportation (Non-Medical): No    CCM Care Plan  Allergies  Allergen Reactions   Banana Swelling    Lips, tongue and face   Sulfa Antibiotics Diarrhea and Nausea And Vomiting    Syncope episode and was incoherent   Sulfacetamide Sodium Other (See Comments)    Syncope episode and was incoherent   Penicillins Diarrhea, Nausea And Vomiting and Other (See  Comments)    Has patient had a PCN reaction causing immediate rash, facial/tongue/throat swelling, SOB or lightheadedness with hypotension: Yes Has patient had a PCN reaction causing severe rash involving mucus membranes or skin necrosis: No Has patient had a PCN reaction that required hospitalization No Has patient had a PCN reaction occurring within the last 10 years: No If all of the above answers are "NO", then may proceed with Cephalosporin use.  Passed out,     Medications Reviewed Today     Reviewed by [name], [facility] (Pharmacist) on 05/28/21 at 1047  Med List Status: <None>   Medication Order Taking? Sig Documenting Provider Last Dose Status Informant  acetaminophen (TYLENOL) 160 MG/5ML solution [id number]  Take 320 mg by mouth 2 (two) times daily as needed for mild pain. Reported on 07/21/2015 [provider]  Active Self  chlorpheniramine-HYDROcodone Amanda Cockayne [facility] ER) 10-8 MG/5ML Latanya Presser [id number]  Take 5 mLs by mouth every 12 (twelve) hours as needed for cough. Laurey Morale, MD  Active   cholecalciferol (VITAMIN D) 1000 units tablet [id number]  Take 1,000 Units by mouth daily. OTC [provider]  Active    citalopram (CELEXA) 20 MG tablet [id number]  Take 1 tablet (20 mg total) by mouth daily. Laurey Morale, MD  Active   cyanocobalamin (,VITAMIN B-12,) 1000 MCG/ML injection [id number]  Inject into the muscle. [provider]  Active   cyclobenzaprine (FLEXERIL) 10 MG tablet [id number]  Take 10 mg by mouth 2 (two) times daily as needed for muscle spasms. [provider]  Active   diphenhydrAMINE (BENADRYL) 12.5 MG/5ML elixir [id number]  Take 25 mg by mouth 4 (four) times daily as needed for allergies. [provider]  Active Self  fluticasone (FLONASE) 50 MCG/ACT nasal spray [id number]  Place into the nose. [provider]  Active   furosemide (LASIX) 40 MG tablet [id number] Yes Take 1 tablet (40 mg total) by mouth daily. Laurey Morale, MD Taking Active   gabapentin (NEURONTIN) 300 MG capsule [id number]  Take 1 capsule (300 mg total) by mouth 3 (three) times daily. Laurey Morale, MD  Active   hydrOXYzine (ATARAX/VISTARIL) 25 MG tablet [id number]  TAKE 1 TABLET (25 MG TOTAL) BY MOUTH 3 (THREE) TIMES DAILY AS NEEDED FOR ITCHING. Marletta Lor, MD  Active   levETIRAcetam (KEPPRA) 500 MG tablet [id number]  Take 1 tablet (500 mg total) by mouth 2 (two) times daily. Laurey Morale, MD  Active   levocetirizine Harlow Ohms) 5 MG tablet [id number]  Take by mouth. [provider]  Active   Oxcarbazepine (TRILEPTAL) 300 MG tablet [id number]  TAKE 1 TABLET BY MOUTH 2 TIMES DAILY. Laurey Morale, MD  Active   propranolol (INDERAL) 20 MG tablet [id number]  TAKE 1 OR 2 TABLETS THE MORNINGS OF MEDICAL OR DENTAL PROCEDURES Laurey Morale, MD  Active   tizanidine (ZANAFLEX) 2 MG capsule [id number]  Take 1 capsule (2 mg total) by mouth 2 (two) times daily. Laurey Morale, MD  Active             Patient Active Problem List   Diagnosis Date Noted   COVID-19 virus infection 01/07/2021   Low back pain due to bilateral sciatica 02/22/2018   Essential hypertension 05/30/2015    Right-sided chest pain 06/27/2014   Microcytic anemia 06/27/2014   Multiple sclerosis exacerbation ([place]) 06/27/2014   Obesity (BMI 30-39.9) 06/27/2014   B12 deficiency 06/06/2013   Multiple sclerosis ([place]) 08/12/2010   GERD  12/30/2006   PARESTHESIA 12/30/2006    Immunization History  Administered Date(s) Administered   PPD Test 07/09/2014   Td 05/18/2000   Tdap 03/29/2015   Patient reports her MS has been acting crazy and since her husband unexpectedly passed. She thinks everything is going on because of her husband's death but does report her friends and family have been great support. She is still interested in grief counseling with the social worker in the office.   Conditions to be addressed/monitored:  Hypertension, GERD, and MS, vitamin B12 deficiency, anemia  Conditions addressed this visit: Hypertension,  MS, GERD   Care Plan : CCM Pharmacy Care Plan  Updates made by [name], [place] since 05/28/2021 12:00 AM     Problem: Problem: Hypertension, GERD, and MS, vitamin B12 deficiency, anemia      Long-Range Goal: Patient-Specific Goal   Start Date: 11/26/2020  Expected End Date: 11/26/2021  Recent Progress: On track  Priority: High  Note:   Current Barriers:  Unable to independently afford treatment regimen Unable to independently monitor therapeutic efficacy  Pharmacist Clinical Goal(s):  Patient will verbalize ability to afford treatment regimen achieve adherence to monitoring guidelines and medication adherence to achieve therapeutic efficacy through collaboration with PharmD and provider.   Interventions: 1:1 collaboration with Laurey Morale, MD regarding development and update of comprehensive plan of care as evidenced by provider attestation and co-signature Inter-disciplinary care team collaboration (see longitudinal plan of care) Comprehensive medication review performed; medication list updated in electronic medical record  Hypertension (BP goal  <130/80) -Not ideally controlled -Current treatment: Furosemide 40 mg 1 tablet daily -  query appropriate -Medications previously tried: HCTZ, lisinopril -Current home readings: does not check - owns an arm cuff -Current dietary habits: doesn't use added salt; doesn't look at package labels -Current exercise habits: not able to exercise -Denies hypotensive/hypertensive symptoms -Educated on BP goals and benefits of medications for prevention of heart attack, stroke and kidney damage; Importance of home blood pressure monitoring; Proper BP monitoring technique; -Counseled to monitor BP at home weekly, document, and provide log at future appointments -Counseled on diet and exercise extensively  Swelling (Goal: minimize swelling) -Controlled -Current treatment  Furosemide 40 mg 1 tablet daily - appropriate, query effective -Medications previously tried: none  -Recommended to continue current medication Counseled on importance of daily weights.  Depression/Anxiety (Goal: minimize symptoms) -Controlled -Current treatment: Citalopram 20 mg 1 tablet daily - appropriate, query effective -Medications previously tried/failed: none -PHQ9: 0 -GAD7: n/a -Educated on Benefits of medication for symptom control Benefits of cognitive-behavioral therapy with or without medication -Recommended to continue current medication Patient does not wish to try another medication at this time.  Anemia (Goal: HgB >11) -Not ideally controlled -Current treatment  Ferrous sulfate 220 mg/5 mL - not taking consistently -Medications previously tried: none  -Recommended to continue current medication Counseled on importance of taking this medication.  Seizures (Goal: prevent seizures) -Uncontrolled -Current treatment  Levetiracetam 100 mg/mL 500 mg twice daily - appropriate, query effective Oxcarbazepine 300 mg 1 tablet twice daily - appropriate, query effective -Medications previously tried: unknown   -Counseled on importance of taking these medications consistently. Patient does not take these twice daily due to drowsiness and cost.  GERD/erosive esophagitis (Goal: minimize symptoms) -Controlled -Current treatment  None -Medications previously tried: famotidine, pantoprazole, sucalfate  -Recommended to continue as is  Itching/allergic rhinitis (Goal: minimize symptoms) -Uncontrolled -Current treatment  Flonase 50 mcg/act 1 spray as needed - appropriate, query effective Diphenhydramine 12.5  mg/14mL daily as needed - appropriate, query effective Hydroxyzine 25 mg 1 tablet as needed for itching - appropriate, query effective Levocetirizine 5 mg 1 tablet daily - appropriate, query effective -Medications previously tried: n/a  -Recommended to continue current medication Patient was following with an allergist but does not want to follow up.  Muscle spasms (Goal: minimize symptoms) -Controlled -Current treatment  Tizanidine 2 mg 1 capsule twice daily - appropriate, effective, query safe Cyclobenzaprine 10 mg as needed for muscle spasms - appropriate, effective, query safe -Medications previously tried: none  -Counseled on not using both cyclobenzaprine and tizanidine at the same time.   Health Maintenance -Vaccine gaps: shingrix, COVID, prevnar -Current therapy:  Vitamin B12 1000 mcg/mL  Vitamin D 1000 units daily Propranolol 20 mg 1 or 2 tablets the morning of dental procedures  Acetaminophen 160 mg/65mL 320 mg twice daily as needed - using on -Educated on Cost vs benefit of each product must be carefully weighed by individual consumer -Patient is satisfied with current therapy and denies issues -Counseled on avoidance of NSAIDS  Patient Goals/Self-Care Activities Patient will:  - take medications as prescribed check blood pressure weekly, document, and provide at future appointments  Follow Up Plan: The care management team will reach out to the patient again over the  next 30 days.        Medication Assistance:  Provided information to apply for Medicare extra help.  Compliance/Adherence/Medication fill history: Care Gaps: COVID booster, prevnar, Hep C screening, pap smear, colonoscopy, mammogram, shingrix, influenza BP- 120/78 (2/22)  Star-Rating Drugs: None  Patient's preferred pharmacy is:  [id number] [address], [place] [address] Phone: [phone] Fax: [phone]  [id number] [place], [place] [address] [place] [address] Phone: [phone] Fax: [phone]   Uses pill box? Yes Pt endorses 50% compliance  We discussed: Current pharmacy is preferred with insurance plan and patient is satisfied with pharmacy services Patient decided to: Continue current medication management strategy  Care Plan and Follow Up Patient Decision:  Patient agrees to Care Plan and Follow-up.  Plan: The care management team will reach out to the patient again over the next 30 days.  Jeni Salles, PharmD, [place] Pharmacist [place] [facility] [place]

## 2021-05-27 NOTE — Progress Notes (Signed)
° °  Subjective:    Patient ID: [name], female    DOB: [birth year], 56 y.o.   MRN: [id number]  HPI Here for several issues. First she has been swelling in both hands and both feet intermittently for the past month. No SOB. No recent medication changes. She has also had mild numbness and tingling in the hands and feet for about a year. She is concerned she may have diabetes since she has a strong family hx of this.    Review of Systems  Constitutional: Negative.   Respiratory: Negative.    Cardiovascular:  Positive for leg swelling. Negative for chest pain and palpitations.  Gastrointestinal: Negative.   Neurological:  Positive for numbness.      Objective:   Physical Exam Constitutional:      Appearance: She is obese. She is not ill-appearing.  Cardiovascular:     Rate and Rhythm: Normal rate and regular rhythm.     Pulses: Normal pulses.     Heart sounds: Normal heart sounds.  Pulmonary:     Effort: Pulmonary effort is normal.     Breath sounds: Normal breath sounds.  Musculoskeletal:     Comments: 1+ edema in both ankles   Lymphadenopathy:     Cervical: No cervical adenopathy.  Neurological:     Mental Status: She is alert.          Assessment & Plan:  For the edema, she can use Lasix 40 mg every morning. We will check labs today to include a BNP and renal function. For the numbness and tingling, certainly Q94 deficiency can cause a neuropathy. We will check an A1c for diabetes or prediabetes.  We spent a total of ( 31  ) minutes reviewing records and discussing these issues.  Alysia Penna, MD

## 2021-05-28 ENCOUNTER — Ambulatory Visit (INDEPENDENT_AMBULATORY_CARE_PROVIDER_SITE_OTHER): Payer: PPO | Admitting: Pharmacist

## 2021-05-28 DIAGNOSIS — G35 Multiple sclerosis: Secondary | ICD-10-CM

## 2021-05-28 DIAGNOSIS — K2101 Gastro-esophageal reflux disease with esophagitis, with bleeding: Secondary | ICD-10-CM

## 2021-05-28 DIAGNOSIS — I10 Essential (primary) hypertension: Secondary | ICD-10-CM

## 2021-06-05 ENCOUNTER — Telehealth: Payer: Self-pay | Admitting: *Deleted

## 2021-06-05 NOTE — Chronic Care Management (AMB) (Signed)
°  Care Management   Note  06/05/2021 Name: [name] MRN: [id number] DOB: 15-Jun-1965  [name] is a 56 y.o. year old female who is a primary care patient of Laurey Morale, MD and is actively engaged with the care management team. I reached out to [name] by phone today to assist with scheduling an initial visit with the Licensed Clinical Social Worker  Follow up plan: Unsuccessful telephone outreach attempt made. A HIPAA compliant phone message was left for the patient providing contact information and requesting a return call.  The care management team will reach out to the patient again over the next 7 days.  If patient returns call to provider office, please advise to call [place] at [phone].  [place] Management  Direct Dial: [phone]

## 2021-06-12 NOTE — Chronic Care Management (AMB) (Signed)
°  Care Management   Note  06/12/2021 Name: [name] MRN: [id number] DOB: 10/11/1965  [name] is a 56 y.o. year old female who is a primary care patient of Laurey Morale, MD and is actively engaged with the care management team. I reached out to [name] by phone today to assist with scheduling an initial visit with the Licensed Clinical Social Worker  Follow up plan: Unsuccessful telephone outreach attempt made. A HIPAA compliant phone message was left for the patient providing contact information and requesting a return call.  The care management team will reach out to the patient again over the next 7 days.  If patient returns call to provider office, please advise to call [place] at [place] Management  Direct Dial: [phone]

## 2021-06-17 DIAGNOSIS — G35 Multiple sclerosis: Secondary | ICD-10-CM | POA: Diagnosis not present

## 2021-06-17 DIAGNOSIS — I10 Essential (primary) hypertension: Secondary | ICD-10-CM | POA: Diagnosis not present

## 2021-06-19 NOTE — Chronic Care Management (AMB) (Signed)
°  Care Management   Note  06/19/2021 Name: ARIANY KESSELMAN MRN: [id number] DOB: 25-Nov-1965  LONDAN COPLEN is a 56 y.o. year old female who is a primary care patient of Laurey Morale, MD and is actively engaged with the care management team. I reached out to [name] by phone today to assist with scheduling an initial visit with the Licensed Clinical Social Worker  Follow up plan: A third unsuccessful telephone outreach attempt made. A HIPAA compliant phone message was left for the patient providing contact information and requesting a return call. The patient has been provided with contact information for the care management team and has been advised to call with any health related questions or concerns. If patient returns call to provider office, please advise to call [place] at [phone].  [place] Management  Direct Dial: [phone]

## 2021-07-02 ENCOUNTER — Telehealth: Payer: Self-pay | Admitting: Pharmacist

## 2021-07-02 NOTE — Chronic Care Management (AMB) (Signed)
Chronic Care Management Pharmacy Assistant   Name: [name]  MRN: [id number] DOB: [birth year]  Reason for Encounter: Disease State Hypertension Assessment   Conditions to be addressed/monitored: HTN  Recent office visits:  None  Recent consult visits:  None  Hospital visits:  None in previous 6 months  Medications: Outpatient Encounter Medications as of 07/02/2021  Medication Sig   acetaminophen (TYLENOL) 160 MG/5ML solution Take 320 mg by mouth 2 (two) times daily as needed for mild pain. Reported on 07/21/2015   chlorpheniramine-HYDROcodone (TUSSIONEX PENNKINETIC ER) 10-8 MG/5ML SUER Take 5 mLs by mouth every 12 (twelve) hours as needed for cough.   cholecalciferol (VITAMIN D) 1000 units tablet Take 1,000 Units by mouth daily. OTC   citalopram (CELEXA) 20 MG tablet Take 1 tablet (20 mg total) by mouth daily.   cyanocobalamin (,VITAMIN B-12,) 1000 MCG/ML injection Inject into the muscle.   cyclobenzaprine (FLEXERIL) 10 MG tablet Take 10 mg by mouth 2 (two) times daily as needed for muscle spasms.   diphenhydrAMINE (BENADRYL) 12.5 MG/5ML elixir Take 25 mg by mouth 4 (four) times daily as needed for allergies.   fluticasone (FLONASE) 50 MCG/ACT nasal spray Place into the nose.   furosemide (LASIX) 40 MG tablet Take 1 tablet (40 mg total) by mouth daily.   gabapentin (NEURONTIN) 300 MG capsule Take 1 capsule (300 mg total) by mouth 3 (three) times daily.   hydrOXYzine (ATARAX/VISTARIL) 25 MG tablet TAKE 1 TABLET (25 MG TOTAL) BY MOUTH 3 (THREE) TIMES DAILY AS NEEDED FOR ITCHING.   levETIRAcetam (KEPPRA) 500 MG tablet Take 1 tablet (500 mg total) by mouth 2 (two) times daily.   levocetirizine (XYZAL) 5 MG tablet Take by mouth.   Oxcarbazepine (TRILEPTAL) 300 MG tablet TAKE 1 TABLET BY MOUTH 2 TIMES DAILY.   propranolol (INDERAL) 20 MG tablet TAKE 1 OR 2 TABLETS THE MORNINGS OF MEDICAL OR DENTAL PROCEDURES   tizanidine (ZANAFLEX) 2 MG capsule Take 1 capsule (2 mg total)  by mouth 2 (two) times daily.   No facility-administered encounter medications on file as of 07/02/2021.  Reviewed chart prior to disease state call. Spoke with patient regarding BP  Recent Office Vitals: BP Readings from Last 3 Encounters:  05/27/21 (!) 160/92  07/15/20 120/78  03/19/20 (!) 146/112   Pulse Readings from Last 3 Encounters:  05/27/21 92  07/15/20 (!) 102  03/19/20 77    Wt Readings from Last 3 Encounters:  05/27/21 248 lb (112.5 kg)  07/15/20 244 lb (110.7 kg)  03/19/20 251 lb (113.9 kg)     Kidney Function Lab Results  Component Value Date/Time   CREATININE 0.91 05/27/2021 02:57 PM   CREATININE 0.96 06/02/2018 03:09 PM   CREATININE 0.89 01/07/2017 04:07 PM   CREATININE 0.92 12/07/2016 02:18 PM   GFR 72.22 05/27/2021 02:57 PM   GFRNONAA >60 06/02/2018 03:09 PM   GFRAA >60 06/02/2018 03:09 PM    BMP Latest Ref Rng & Units 05/27/2021 12/20/2018 06/02/2018  Glucose 70 - 99 mg/dL 87 - 91  BUN 6 - 23 mg/dL 12 12 13   Creatinine 0.40 - 1.20 mg/dL 0.91 - 0.96  BUN/Creat Ratio 9 - 23 - - -  Sodium 135 - 145 mEq/L 138 141 138  Potassium 3.5 - 5.1 mEq/L 4.0 4.0 3.6  Chloride 96 - 112 mEq/L 105 - 107  CO2 19 - 32 mEq/L 26 - 22  Calcium 8.4 - 10.5 mg/dL 9.2 - 8.9    Current antihypertensive regimen:  Furosemide  40 mg 1 tablet daily -  query appropriate Per MP recommendations  1/23 to start checking BP  3 attempts made to reach pt  Care Gaps: BP- 160/9 ( 05/27/21) Hepatitis C Screening - Overdue Zoster Vaccine - Overdue PAP - Overdue COVID Booster - Overdue Flu Vaccine - Overdue Awv- 6/23 CCM- 5/23  Star Rating Drugs: None   Ned Clines [place] Clinical Pharmacist Assistant [phone]

## 2021-07-10 ENCOUNTER — Ambulatory Visit: Payer: PPO

## 2021-07-10 ENCOUNTER — Other Ambulatory Visit: Payer: Self-pay

## 2021-07-10 ENCOUNTER — Telehealth: Payer: Self-pay | Admitting: Family Medicine

## 2021-07-10 MED ORDER — TIZANIDINE HCL 2 MG PO CAPS
2.0000 mg | ORAL_CAPSULE | Freq: Two times a day (BID) | ORAL | 2 refills | Status: DC
Start: 2021-07-10 — End: 2022-04-01

## 2021-07-10 MED ORDER — OXCARBAZEPINE 300 MG PO TABS: 300.0000 mg | ORAL_TABLET | Freq: Two times a day (BID) | ORAL | 0 refills | Status: DC

## 2021-07-10 MED ORDER — CITALOPRAM HYDROBROMIDE 20 MG PO TABS: 20.0000 mg | ORAL_TABLET | Freq: Every day | ORAL | 1 refills | Status: DC

## 2021-07-10 MED ORDER — HYDROXYZINE HCL 25 MG PO TABS: 25.0000 mg | ORAL_TABLET | Freq: Three times a day (TID) | ORAL | 1 refills | Status: AC | PRN

## 2021-07-10 MED ORDER — LEVETIRACETAM 500 MG PO TABS
500.0000 mg | ORAL_TABLET | Freq: Two times a day (BID) | ORAL | 3 refills | Status: DC
Start: 2021-07-10 — End: 2022-02-20

## 2021-07-10 MED ORDER — CYCLOBENZAPRINE HCL 10 MG PO TABS: 10.0000 mg | ORAL_TABLET | Freq: Two times a day (BID) | ORAL | 1 refills | Status: DC | PRN

## 2021-07-10 MED ORDER — GABAPENTIN 300 MG PO CAPS: 300.0000 mg | ORAL_CAPSULE | Freq: Three times a day (TID) | ORAL | 2 refills | Status: DC

## 2021-07-10 NOTE — Telephone Encounter (Signed)
Patient is requesting a call from Nancy--she woudn't give any details.

## 2021-07-10 NOTE — Telephone Encounter (Signed)
Spoke with pt send refills on all her Rx to her pharmacy

## 2021-07-11 ENCOUNTER — Ambulatory Visit (INDEPENDENT_AMBULATORY_CARE_PROVIDER_SITE_OTHER): Payer: PPO

## 2021-07-11 DIAGNOSIS — M5441 Lumbago with sciatica, right side: Secondary | ICD-10-CM

## 2021-07-11 DIAGNOSIS — G35 Multiple sclerosis: Secondary | ICD-10-CM

## 2021-07-11 DIAGNOSIS — I10 Essential (primary) hypertension: Secondary | ICD-10-CM

## 2021-07-11 NOTE — Chronic Care Management (AMB) (Signed)
Chronic Care Management   CCM RN Visit Note  07/11/2021 Name: [name] MRN: [id number] DOB: [birth year]  Subjective: [name] is a 56 y.o. year old female who is a primary care patient of Laurey Morale, MD. The care management team was consulted for assistance with disease management and care coordination needs.    Engaged with patient by telephone for follow up visit in response to provider referral for case management and/or care coordination services.   Consent to Services:  The patient was given information about Chronic Care Management services, agreed to services, and gave verbal consent prior to initiation of services.  Please see initial visit note for detailed documentation.   Patient agreed to services and verbal consent obtained.   Assessment: Review of patient past medical history, allergies, medications, health status, including review of consultants reports, laboratory and other test data, was performed as part of comprehensive evaluation and provision of chronic care management services.   SDOH (Social Determinants of Health) assessments and interventions performed:    CCM Care Plan  Allergies  Allergen Reactions   Banana Swelling    Lips, tongue and face   Sulfa Antibiotics Diarrhea and Nausea And Vomiting    Syncope episode and was incoherent   Sulfacetamide Sodium Other (See Comments)    Syncope episode and was incoherent   Penicillins Diarrhea, Nausea And Vomiting and Other (See Comments)    Has patient had a PCN reaction causing immediate rash, facial/tongue/throat swelling, SOB or lightheadedness with hypotension: Yes Has patient had a PCN reaction causing severe rash involving mucus membranes or skin necrosis: No Has patient had a PCN reaction that required hospitalization No Has patient had a PCN reaction occurring within the last 10 years: No If all of the above answers are "NO", then may proceed with Cephalosporin use.  Passed  out,     Outpatient Encounter Medications as of 07/11/2021  Medication Sig   acetaminophen (TYLENOL) 160 MG/5ML solution Take 320 mg by mouth 2 (two) times daily as needed for mild pain. Reported on 07/21/2015   chlorpheniramine-HYDROcodone (TUSSIONEX PENNKINETIC ER) 10-8 MG/5ML SUER Take 5 mLs by mouth every 12 (twelve) hours as needed for cough.   cholecalciferol (VITAMIN D) 1000 units tablet Take 1,000 Units by mouth daily. OTC   citalopram (CELEXA) 20 MG tablet Take 1 tablet (20 mg total) by mouth daily.   cyanocobalamin (,VITAMIN B-12,) 1000 MCG/ML injection Inject into the muscle.   cyclobenzaprine (FLEXERIL) 10 MG tablet Take 1 tablet (10 mg total) by mouth 2 (two) times daily as needed for muscle spasms.   diphenhydrAMINE (BENADRYL) 12.5 MG/5ML elixir Take 25 mg by mouth 4 (four) times daily as needed for allergies.   fluticasone (FLONASE) 50 MCG/ACT nasal spray Place into the nose.   furosemide (LASIX) 40 MG tablet Take 1 tablet (40 mg total) by mouth daily.   gabapentin (NEURONTIN) 300 MG capsule Take 1 capsule (300 mg total) by mouth 3 (three) times daily.   hydrOXYzine (ATARAX) 25 MG tablet Take 1 tablet (25 mg total) by mouth 3 (three) times daily as needed for itching.   levETIRAcetam (KEPPRA) 500 MG tablet Take 1 tablet (500 mg total) by mouth 2 (two) times daily.   levocetirizine (XYZAL) 5 MG tablet Take by mouth.   Oxcarbazepine (TRILEPTAL) 300 MG tablet Take 1 tablet (300 mg total) by mouth 2 (two) times daily.   propranolol (INDERAL) 20 MG tablet TAKE 1 OR 2 TABLETS THE MORNINGS OF MEDICAL OR DENTAL  PROCEDURES   tizanidine (ZANAFLEX) 2 MG capsule Take 1 capsule (2 mg total) by mouth 2 (two) times daily.   No facility-administered encounter medications on file as of 07/11/2021.    Patient Active Problem List   Diagnosis Date Noted   COVID-19 virus infection 01/07/2021   Low back pain due to bilateral sciatica 02/22/2018   Essential hypertension 05/30/2015   Right-sided  chest pain 06/27/2014   Microcytic anemia 06/27/2014   Multiple sclerosis exacerbation ([place]) 06/27/2014   Obesity (BMI 30-39.9) 06/27/2014   B12 deficiency 06/06/2013   Multiple sclerosis ([place]) 08/12/2010   GERD 12/30/2006   PARESTHESIA 12/30/2006    Conditions to be addressed/monitored:HTN, GERD, and MS, seizures  Care Plan : RN Care Manager Plan of Care  Updates made by [name], RN since 07/11/2021 12:00 AM     Problem: Chronic Disease Management and Care Coordination Needs (MS,HTN, GERD, seizures)   Priority: High     Long-Range Goal: Establish Plan of Care for Chronic Disease Management Needs (MS,HTN, GERD, seizures)   Start Date: 04/15/2021  Expected End Date: 10/12/2021  Priority: High  Note:   Current Barriers:  Care Coordination needs related to Limited social support and ADL IADL limitations Chronic Disease Management support and education needs related to HTN, GERD, and MS,seizures  Lacks caregiver support States she is still grieving her husbands death.  States she has not been doing grief counseling group at her church. States she would like to speak with CCM LCSW. States her sister and other family members have been helping her with shopping and transportation.  States she is taking her medications as ordered.  States her swelling in her hands and feet are better.  States she is to see her neurologist at [facility] in April or May.  States she has frequent falls and uses her cane. States she keeps her phone with her all of the time. States she is trying to eat healthy and her stomach issues are gone.  RNCM Clinical Goal(s):  Patient will verbalize basic understanding of  HTN, GERD, and MS,seizures  disease process and self health management plan as evidenced by voiced adherence to plan of care take all medications exactly as prescribed and will call provider for medication related questions as evidenced by dispense report and pt verbalization attend all scheduled  medical appointments: CCM PharmD 09/24/21, [facility] 09/26/21 as evidenced by review of medical records demonstrate Improved adherence to prescribed treatment plan for HTN, GERD, and MS,seizures  as evidenced by readings within limits, voiced adherence to plan of care continue to work with RN Care Manager to address care management and care coordination needs related to  CAD, GERD, and MS,seizures  as evidenced by adherence to CM Team Scheduled appointments through collaboration with RN Care manager, provider, and care team.   Interventions: 1:1 collaboration with primary care provider regarding development and update of comprehensive plan of care as evidenced by provider attestation and co-signature Inter-disciplinary care team collaboration (see longitudinal plan of care) Evaluation of current treatment plan related to  self management and patient's adherence to plan as established by provider    Falls Interventions:  (Status:  Goal on track:  Yes.) Long Term Goal Reviewed medications and discussed potential side effects of medications such as dizziness and frequent urination Advised patient of importance of notifying provider of falls Assessed for falls since last encounter Assessed patients knowledge of fall risk prevention secondary to previously provided education Reinforced to keep her phone with her at  all times. Reinforced to use cane at all times    MS  (Status:  Goal on track:  Yes.)  Long Term Goal Evaluation of current treatment plan related to  MS , Limited social support and ADL IADL limitations self-management and patient's adherence to plan as established by provider. Discussed plans with patient for ongoing care management follow up and provided patient with direct contact information for care management team Evaluation of current treatment plan related to MS and patient's adherence to plan as established by provider Reviewed medications with patient and discussed  adherence Social Work referral for grief counseling  Discussed plans with patient for ongoing care management follow up and provided patient with direct contact information for care management team Reinforced ways to help with generalized pain of MS, to get adequate rest and to not get over heated  Hypertension Interventions:  (Status:  Goal on track:  Yes.) Long Term Goal Last practice recorded BP readings:  BP Readings from Last 3 Encounters:  05/27/21 (!) 160/92  07/15/20 120/78  03/19/20 (!) 146/112  Most recent eGFR/CrCl: No results found for: EGFR  No components found for: CRCL  Evaluation of current treatment plan related to hypertension self management and patient's adherence to plan as established by provider Provided education to patient re: stroke prevention, s/s of heart attack and stroke Reviewed medications with patient and discussed importance of compliance Advised patient, providing education and rationale, to monitor blood pressure daily and record, calling PCP for findings outside established parameters Provided education on prescribed diet low sodium  Patient Goals/Self-Care Activities: Take all medications as prescribed Attend all scheduled provider appointments Call pharmacy for medication refills 3-7 days in advance of running out of medications Perform all self care activities independently  Call provider office for new concerns or questions  check blood pressure weekly write blood pressure results in a log or diary call doctor for signs and symptoms of high blood pressure eat more whole grains, fruits and vegetables, lean meats and healthy fats limit salt intake to 2341m/day  eat healthy get at least 8 hours of sleep at night  keep the bedroom cool and dark  maintain a healthy weight  stop using electronic devices like cell phone or video game when getting ready for sleep use a fan or white noise in bedroom  use cooling vest  use devices that will help  like a cane, sock-puller or reacher  use meditation or relaxation techniques  Follow Up Plan:  Telephone follow up appointment with care management team member scheduled for:  10/03/21 The patient has been provided with contact information for the care management team and has been advised to call with any health related questions or concerns.       Plan:Telephone follow up appointment with care management team member scheduled for:  10/03/21 The patient has been provided with contact information for the care management team and has been advised to call with any health related questions or concerns.  MPeter GarterRN, BJackquline Denmark CDE Care Management Coordinator [facility] [phone]

## 2021-07-11 NOTE — Patient Instructions (Addendum)
Visit Information  Thank you for taking time to visit with me today. Please don't hesitate to contact me if I can be of assistance to you before our next scheduled telephone appointment.  Following are the goals we discussed today:  Take all medications as prescribed Attend all scheduled provider appointments Call pharmacy for medication refills 3-7 days in advance of running out of medications Perform all self care activities independently  Call provider office for new concerns or questions  check blood pressure weekly write blood pressure results in a log or diary call doctor for signs and symptoms of high blood pressure eat more whole grains, fruits and vegetables, lean meats and healthy fats limit salt intake to 2300mg /day  eat healthy get at least 8 hours of sleep at night  keep the bedroom cool and dark  maintain a healthy weight  stop using electronic devices like cell phone or video game when getting ready for sleep use a fan or white noise in bedroom  use cooling vest  use devices that will help like a cane, sock-puller or reacher  use meditation or relaxation techniques  Our next appointment is by telephone on 10/03/21 at 2:15 PM  Please call the care guide team at [phone] if you need to cancel or reschedule your appointment.   If you are experiencing a Mental Health or [place] or need someone to talk to, please call the Suicide and Crisis Lifeline: 988 call the [place] National Suicide Prevention Lifeline: 425-742-5672 or TTY: 684 687 3017 TTY 706-160-4121) to talk to a trained counselor call 1-800-273-TALK (toll free, 24 hour hotline) go to [facility] [address] [place] [phone]) call 911   The patient verbalized understanding of instructions, educational materials, and care plan provided today and agreed to receive a mailed copy of patient instructions, educational materials, and care plan.    Peter Garter RN, Jackquline Denmark, CDE Care Management Coordinator [facility] [phone]

## 2021-07-14 ENCOUNTER — Telehealth: Payer: Self-pay | Admitting: Family Medicine

## 2021-07-14 ENCOUNTER — Other Ambulatory Visit: Payer: Self-pay

## 2021-07-14 NOTE — Telephone Encounter (Signed)
Pt usually takes Tizanidine and instead Flexeril was called in to her pharmacy.  She Tizanidine to be called in instead.     [place]

## 2021-07-14 NOTE — Telephone Encounter (Signed)
Tizanidine had previously been sent to the pharmacy.   Called patient she was currently at pharmacy picking up prescription.

## 2021-07-15 ENCOUNTER — Ambulatory Visit: Payer: PPO

## 2021-07-15 DIAGNOSIS — I10 Essential (primary) hypertension: Secondary | ICD-10-CM

## 2021-07-15 DIAGNOSIS — R609 Edema, unspecified: Secondary | ICD-10-CM

## 2021-07-16 ENCOUNTER — Telehealth: Payer: Self-pay | Admitting: *Deleted

## 2021-07-16 NOTE — Chronic Care Management (AMB) (Signed)
?  Care Management  ? ?Note ? ?07/16/2021 ?Name: [name] MRN: [id number] DOB: 03/26/1966 ? ?[name] is a 56 y.o. year old female who is a primary care patient of Laurey Morale, MD and is actively engaged with the care management team. I reached out to [name] by phone today to assist with scheduling an initial visit with the Licensed Clinical Social Worker ? ?Follow up plan: ?Unsuccessful telephone outreach attempt made. A HIPAA compliant phone message was left for the patient providing contact information and requesting a return call.  ?The care management team will reach out to the patient again over the next 7 days.  ?If patient returns call to provider office, please advise to call Mar[place] at [phone]. ? ?Laverda Sorenson  ?Care Guide, Embedded Care Coordination ?[place]  Care Management  ?Direct Dial: [phone] ? ?

## 2021-07-17 ENCOUNTER — Ambulatory Visit (INDEPENDENT_AMBULATORY_CARE_PROVIDER_SITE_OTHER): Payer: PPO

## 2021-07-17 DIAGNOSIS — E538 Deficiency of other specified B group vitamins: Secondary | ICD-10-CM | POA: Diagnosis not present

## 2021-07-17 MED ORDER — CYANOCOBALAMIN 1000 MCG/ML IJ SOLN
1000.0000 ug | INTRAMUSCULAR | Status: AC
  Administered 2021-07-17: 1000 ug via INTRAMUSCULAR

## 2021-07-17 NOTE — Progress Notes (Signed)
Per orders of Laurey Morale, MD, injection of B12 given in  left  deltoid by Eilidh Marcano D Lace Chenevert. ?Patient tolerated injection well. ? ?Lab Results  ?Component Value Date  ? [id number] 250 03/04/2021  ? ? ?  ?

## 2021-07-23 NOTE — Chronic Care Management (AMB) (Signed)
?  Care Management  ? ?Note ? ?07/23/2021 ?Name: [name] MRN: [id number] DOB: 1965-09-07 ? ?[name] is a 56 y.o. year old female who is a primary care patient of Laurey Morale, MD and is actively engaged with the care management team. I reached out to [name] by phone today to assist with scheduling an initial visit with the Licensed Clinical Social Worker ? ?Follow up plan: ?Unsuccessful telephone outreach attempt made. A HIPAA compliant phone message was left for the patient providing contact information and requesting a return call.  ?The care management team will reach out to the patient again over the next 7 days.  ?If patient returns call to provider office, please advise to call [place] at [phone]. ? ?Laverda Sorenson  ?Care Guide, Embedded Care Coordination ?[place]  Care Management  ?Direct Dial: [phone] ? ?

## 2021-07-30 NOTE — Chronic Care Management (AMB) (Signed)
?  Care Management  ? ?Note ? ?07/30/2021 ?Name: ARIELYS WANDERSEE MRN: [id number] DOB: 12-27-1965 ? ?SHARINE CADLE is a 56 y.o. year old female who is a primary care patient of Laurey Morale, MD and is actively engaged with the care management team. I reached out to [name] by phone today to assist with scheduling an initial visit with the Licensed Clinical Social Worker ? ?Follow up plan: ?Unsuccessful telephone outreach attempt made. A HIPAA compliant phone message was left for the patient providing contact information and requesting a return call. Unable to make contact on outreach attempts x 3. Notified via routed documentation in medical record. If patient returns call to provider office, please advise to call [place]  at [phone]. ? ?Laverda Sorenson  ?Care Guide, Embedded Care Coordination ?[place]  Care Management  ?Direct Dial: [phone] ? ?

## 2021-09-23 ENCOUNTER — Telehealth: Payer: Self-pay | Admitting: Pharmacist

## 2021-09-23 NOTE — Chronic Care Management (AMB) (Signed)
? ? ?  Chronic Care Management ?Pharmacy Assistant  ? ?Name: MELISSA TOMASELLI  MRN: [id number] DOB: 01-Dec-1965 ? ?09/23/21 APPOINTMENT REMINDER ? ? ?Patient was reminded to have all medications, supplements and any blood glucose and blood pressure readings available for review with Jeni Salles, Pharm. D, for telephone visit on 09/24/21 at 3. ? ? ? ?Care Gaps: ?Hepatitis C Screening - Overdue ?Zoster Vaccine - Overdue ?Pap Smear - Overdue ?COVID Booster - Overdue ?Mammogram - Overdue ?BP- 160/92 ( 05/27/21) ?AWV- 6/22 ? ?Star Rating Drug: ?None ? ? ? ?Medications: ?Outpatient Encounter Medications as of 09/23/2021  ?Medication Sig  ? acetaminophen (TYLENOL) 160 MG/5ML solution Take 320 mg by mouth 2 (two) times daily as needed for mild pain. Reported on 07/21/2015  ? chlorpheniramine-HYDROcodone (TUSSIONEX PENNKINETIC ER) 10-8 MG/5ML SUER Take 5 mLs by mouth every 12 (twelve) hours as needed for cough.  ? cholecalciferol (VITAMIN D) 1000 units tablet Take 1,000 Units by mouth daily. OTC  ? citalopram (CELEXA) 20 MG tablet Take 1 tablet (20 mg total) by mouth daily.  ? cyanocobalamin (,VITAMIN B-12,) 1000 MCG/ML injection Inject into the muscle.  ? diphenhydrAMINE (BENADRYL) 12.5 MG/5ML elixir Take 25 mg by mouth 4 (four) times daily as needed for allergies.  ? fluticasone (FLONASE) 50 MCG/ACT nasal spray Place into the nose.  ? furosemide (LASIX) 40 MG tablet Take 1 tablet (40 mg total) by mouth daily.  ? gabapentin (NEURONTIN) 300 MG capsule Take 1 capsule (300 mg total) by mouth 3 (three) times daily.  ? hydrOXYzine (ATARAX) 25 MG tablet Take 1 tablet (25 mg total) by mouth 3 (three) times daily as needed for itching.  ? levETIRAcetam (KEPPRA) 500 MG tablet Take 1 tablet (500 mg total) by mouth 2 (two) times daily.  ? levocetirizine (XYZAL) 5 MG tablet Take by mouth.  ? Oxcarbazepine (TRILEPTAL) 300 MG tablet Take 1 tablet (300 mg total) by mouth 2 (two) times daily.  ? propranolol (INDERAL) 20 MG tablet TAKE 1 OR 2  TABLETS THE MORNINGS OF MEDICAL OR DENTAL PROCEDURES  ? tizanidine (ZANAFLEX) 2 MG capsule Take 1 capsule (2 mg total) by mouth 2 (two) times daily.  ? ?Facility-Administered Encounter Medications as of 09/23/2021  ?Medication  ? cyanocobalamin ((VITAMIN B-12)) injection 1,000 mcg  ? ? ? ?Ned Clines CMA ?Clinical Pharmacist Assistant ?[phone] ? ?

## 2021-09-24 ENCOUNTER — Telehealth: Payer: PPO

## 2021-09-24 ENCOUNTER — Telehealth: Payer: Self-pay | Admitting: Pharmacist

## 2021-09-24 NOTE — Progress Notes (Deleted)
 Chronic Care Management Pharmacy Note  09/24/2021 Name:  [name] MRN:  [id number] DOB:  01/14/1966  Summary: Pt is stressed with death of husband and reports she may be having an MS flare up  Recommendations/Changes made from today's visit: -Recommended routine BP monitoring at home given previous history of elevated BP -Recommended follow up with neurology about MS flare up -Sent message to CCM social worker for grief counseling  Plan: BP assessment in 1 month  Subjective: [name] is an 55 y.o. year old female who is a primary patient of Fry, Stephen A, MD.  The CCM team was consulted for assistance with disease management and care coordination needs.    Engaged with patient by telephone for follow up visit in response to provider referral for pharmacy case management and/or care coordination services.   Consent to Services:  The patient was given information about Chronic Care Management services, agreed to services, and gave verbal consent prior to initiation of services.  Please see initial visit note for detailed documentation.   Patient Care Team: Fry, Stephen A, MD as PCP - General (Family Medicine) Eckstein, Christopher, MD as Referring Physician (Neurology) Sandlin, Melissa J, RN as Case Manager Pryor, Madeline G, RPH as Pharmacist (Pharmacist)  Recent office visits: 07/17/21 Lavon Deberry, CMA: Patient presented for vitamin B12 injection.  05/27/21 Fry, Stephen A, MD: Patient was seen for edema. Prescribed Lasix 40 mg every morning.  01/07/21 Fry, Stephen A, MD - Patient presented for COVID-19 virus infection. Prescribed Azithromycin and Chlorpheniramine-HYDROcodone Stopped Famotidine, Ferrous Sulfate, Pantoprazole and Sucralfate.   01/07/21 Saporito, Joanna D, LCSW - Patient presented for Multiple Sclerosis and other concerns. No medication changes.  Recent consult visits: 02/20/21 Lengel, Ashley Taylor (Neuro) - Patient presented for Multiple  Sclerosis. No medication changes.  09/24/20 John Milner, PA-C (gastroenterology): Patient presented for initial consult for erosive esophagitis. Plan for EGD.  Hospital visits: None in previous 6 months  Objective:  Lab Results  Component Value Date   CREATININE 0.91 05/27/2021   BUN 12 05/27/2021   GFR 72.22 05/27/2021   GFRNONAA >60 06/02/2018   GFRAA >60 06/02/2018   NA 138 05/27/2021   K 4.0 05/27/2021   CALCIUM 9.2 05/27/2021   CO2 26 05/27/2021   GLUCOSE 87 05/27/2021    Lab Results  Component Value Date/Time   HGBA1C 6.1 05/27/2021 02:57 PM   HGBA1C 6.1 (H) 10/17/2012 01:55 PM   GFR 72.22 05/27/2021 02:57 PM   GFR 74.64 03/09/2018 04:22 PM    Last diabetic Eye exam: No results found for: HMDIABEYEEXA  Last diabetic Foot exam: No results found for: HMDIABFOOTEX   Lab Results  Component Value Date   CHOL 216 (H) 11/09/2016   HDL 63.00 11/09/2016   LDLCALC 137 (H) 11/09/2016   TRIG 79.0 11/09/2016   CHOLHDL 3 11/09/2016       Latest Ref Rng & Units 05/27/2021    2:57 PM 12/20/2018   12:00 AM 03/09/2018    4:22 PM  Hepatic Function  Total Protein 6.0 - 8.3 g/dL 7.6    6.8    Albumin 3.5 - 5.2 g/dL 4.2    4.0    AST 0 - 37 U/L 15   15      9    ALT 0 - 35 U/L 12   10      8    Alk Phosphatase 39 - 117 U/L 109   107      83      Total Bilirubin 0.2 - 1.2 mg/dL 0.4    0.3    Bilirubin, Direct 0.0 - 0.3 mg/dL 0.0         This result is from an external source.    Lab Results  Component Value Date/Time   TSH 2.69 05/27/2021 02:57 PM   TSH 2.112 06/02/2018 06:57 PM   TSH 2.18 01/22/2017 03:03 PM   FREET4 0.56 (L) 01/22/2017 03:03 PM   FREET4 0.53 (L) 11/09/2016 11:03 AM       Latest Ref Rng & Units 05/27/2021    2:57 PM 12/20/2018   12:00 AM 06/02/2018    3:09 PM  CBC  WBC 4.0 - 10.5 K/uL 5.8   6.4      6.8    Hemoglobin 12.0 - 15.0 g/dL 12.4   11.9      13.2    Hematocrit 36.0 - 46.0 % 38.5   37      43.1    Platelets 150.0 - 400.0 K/uL 178.0   226       266       This result is from an external source.    Lab Results  Component Value Date/Time   VD25OH 12.63 (L) 11/09/2016 11:03 AM    Clinical ASCVD: No  The ASCVD Risk score (Arnett DK, et al., 2019) failed to calculate for the following reasons:   Cannot find a previous HDL lab   Cannot find a previous total cholesterol lab       12/12/2020    7:23 PM 11/22/2020    1:26 PM 11/12/2020   11:34 AM  Depression screen PHQ 2/9  Decreased Interest 0 0 1  Down, Depressed, Hopeless 1 0 0  PHQ - 2 Score 1 0 1      Social History   Tobacco Use  Smoking Status Never   Passive exposure: Past  Smokeless Tobacco Never   BP Readings from Last 3 Encounters:  05/27/21 (!) 160/92  07/15/20 120/78  03/19/20 (!) 146/112   Pulse Readings from Last 3 Encounters:  05/27/21 92  07/15/20 (!) 102  03/19/20 77   Wt Readings from Last 3 Encounters:  05/27/21 248 lb (112.5 kg)  07/15/20 244 lb (110.7 kg)  03/19/20 251 lb (113.9 kg)   BMI Readings from Last 3 Encounters:  05/27/21 43.94 kg/m  07/15/20 43.23 kg/m  03/19/20 44.47 kg/m    Assessment/Interventions: Review of patient past medical history, allergies, medications, health status, including review of consultants reports, laboratory and other test data, was performed as part of comprehensive evaluation and provision of chronic care management services.   SDOH:  (Social Determinants of Health) assessments and interventions performed: Yes  SDOH Screenings   Alcohol Screen: Low Risk    Last Alcohol Screening Score (AUDIT): 0  Depression (PHQ2-9): Low Risk    PHQ-2 Score: 1  Financial Resource Strain: High Risk   Difficulty of Paying Living Expenses: Hard  Food Insecurity: No Food Insecurity   Worried About Charity fundraiser in the Last Year: Never true   Ran Out of Food in the Last Year: Never true  Housing: Low Risk    Last Housing Risk Score: 0  Physical Activity: Insufficiently Active   Days of Exercise per Week:  3 days   Minutes of Exercise per Session: 30 min  Social Connections: Moderately Isolated   Frequency of Communication with Friends and Family: More than three times a week   Frequency of Social Gatherings with Friends and  Family: More than three times a week   Attends Religious Services: Never   Active Member of Clubs or Organizations: No   Attends Club or Organization Meetings: Never   Marital Status: Married  Stress: Stress Concern Present   Feeling of Stress : Rather much  Tobacco Use: Low Risk    Smoking Tobacco Use: Never   Smokeless Tobacco Use: Never   Passive Exposure: Past  Transportation Needs: No Transportation Needs   Lack of Transportation (Medical): No   Lack of Transportation (Non-Medical): No    CCM Care Plan  Allergies  Allergen Reactions   Banana Swelling    Lips, tongue and face   Sulfa Antibiotics Diarrhea and Nausea And Vomiting    Syncope episode and was incoherent   Sulfacetamide Sodium Other (See Comments)    Syncope episode and was incoherent   Penicillins Diarrhea, Nausea And Vomiting and Other (See Comments)    Has patient had a PCN reaction causing immediate rash, facial/tongue/throat swelling, SOB or lightheadedness with hypotension: Yes Has patient had a PCN reaction causing severe rash involving mucus membranes or skin necrosis: No Has patient had a PCN reaction that required hospitalization No Has patient had a PCN reaction occurring within the last 10 years: No If all of the above answers are "NO", then may proceed with Cephalosporin use.  Passed out,     Medications Reviewed Today     Reviewed by Sandlin, Melissa J, RN (Registered Nurse) on 07/11/21 at 1425  Med List Status: <None>   Medication Order Taking? Sig Documenting Provider Last Dose Status Informant  acetaminophen (TYLENOL) 160 MG/5ML solution [id number] No Take 320 mg by mouth 2 (two) times daily as needed for mild pain. Reported on 07/21/2015 [provider] Taking  Active Self  chlorpheniramine-HYDROcodone (TUSSIONEX PENNKINETIC ER) 10-8 MG/5ML SUER [id number]  Take 5 mLs by mouth every 12 (twelve) hours as needed for cough. Fry, Stephen A, MD  Active   cholecalciferol (VITAMIN D) 1000 units tablet [id number] No Take 1,000 Units by mouth daily. OTC [provider] Taking Active   citalopram (CELEXA) 20 MG tablet [id number]  Take 1 tablet (20 mg total) by mouth daily. Fry, Stephen A, MD  Active   cyanocobalamin (,VITAMIN B-12,) 1000 MCG/ML injection [id number] No Inject into the muscle. [provider] Taking Active   cyclobenzaprine (FLEXERIL) 10 MG tablet [id number]  Take 1 tablet (10 mg total) by mouth 2 (two) times daily as needed for muscle spasms. Fry, Stephen A, MD  Active   diphenhydrAMINE (BENADRYL) 12.5 MG/5ML elixir [id number] No Take 25 mg by mouth 4 (four) times daily as needed for allergies. [provider] Taking Active Self  fluticasone (FLONASE) 50 MCG/ACT nasal spray [id number] No Place into the nose. [provider] Taking Active   furosemide (LASIX) 40 MG tablet [id number] No Take 1 tablet (40 mg total) by mouth daily. Fry, Stephen A, MD Taking Active   gabapentin (NEURONTIN) 300 MG capsule [id number]  Take 1 capsule (300 mg total) by mouth 3 (three) times daily. Fry, Stephen A, MD  Active   hydrOXYzine (ATARAX) 25 MG tablet [id number]  Take 1 tablet (25 mg total) by mouth 3 (three) times daily as needed for itching. Fry, Stephen A, MD  Active   levETIRAcetam (KEPPRA) 500 MG tablet [id number]  Take 1 tablet (500 mg total) by mouth 2 (two) times daily. Fry, Stephen A, MD  Active   levocetirizine (XYZAL) 5 MG tablet [id number] No Take by mouth. [provider]   Taking Active   Oxcarbazepine (TRILEPTAL) 300 MG tablet [id number]  Take 1 tablet (300 mg total) by mouth 2 (two) times daily. Laurey Morale, MD  Active   propranolol (INDERAL) 20 MG tablet [id number] No TAKE 1 OR 2 TABLETS THE MORNINGS OF MEDICAL OR DENTAL  PROCEDURES Laurey Morale, MD Taking Active   tizanidine (ZANAFLEX) 2 MG capsule [id number]  Take 1 capsule (2 mg total) by mouth 2 (two) times daily. Laurey Morale, MD  Active             Patient Active Problem List   Diagnosis Date Noted   COVID-19 virus infection 01/07/2021   Low back pain due to bilateral sciatica 02/22/2018   Essential hypertension 05/30/2015   Right-sided chest pain 06/27/2014   Microcytic anemia 06/27/2014   Multiple sclerosis exacerbation ([place]) 06/27/2014   Obesity (BMI 30-39.9) 06/27/2014   B12 deficiency 06/06/2013   Multiple sclerosis ([place]) 08/12/2010   GERD 12/30/2006   PARESTHESIA 12/30/2006    Immunization History  Administered Date(s) Administered   PPD Test 07/09/2014   Td 05/18/2000   Tdap 03/29/2015   Patient reports her MS has been acting crazy and since her husband unexpectedly passed. She thinks everything is going on because of her husband's death but does report her friends and family have been great support. She is still interested in grief counseling with the social worker in the office.   Conditions to be addressed/monitored:  Hypertension, GERD, and MS, vitamin B12 deficiency, anemia  Conditions addressed this visit: Hypertension,  MS, GERD ***  There are no care plans that you recently modified to display for this patient.    Medication Assistance:  Provided information to apply for Medicare extra help.  Compliance/Adherence/Medication fill history: Care Gaps: COVID booster, prevnar, Hep C screening, pap smear, colonoscopy, mammogram, shingrix BP- 160/92 ( 05/27/21)  Star-Rating Drugs: None  Patient's preferred pharmacy is:  [id number]: [phone]: [phone] [id number] [id number] [id number]:  [phone]: [phone] Uses pill box? Yes Pt endorses 50% compliance  We discussed: Current pharmacy is preferred with insurance plan and patient is satisfied with pharmacy services Patient decided to: Continue current medication management strategy  Care Plan and Follow Up Patient Decision:  Patient agrees to Care Plan and Follow-up.  Plan: The care management team will reach out to the patient again over the next 30 days.  MaJeni SallesPharmD, BCSmithfieldharmacist [place]

## 2021-09-24 NOTE — Telephone Encounter (Signed)
?  Chronic Care Management  ? ?Outreach Note ? ?09/24/2021 ?Name: [name] MRN: [id number] DOB: 09-14-1965 ? ?Referred by: Laurey Morale, MD ? ?Patient had a phone appointment scheduled with clinical pharmacist today. ? ?An unsuccessful telephone outreach was attempted today. The patient was referred to the pharmacist for assistance with care management and care coordination.  ? ?If possible, a message was left to return call to: [phone] or to [facility] [place]: [phone] ? ?Jeni Salles, PharmD, BCACP ?Clinical Pharmacist ?Therapist, music at [facility] ?[phone] ? ? ?

## 2021-09-26 DIAGNOSIS — R569 Unspecified convulsions: Secondary | ICD-10-CM | POA: Diagnosis not present

## 2021-09-26 DIAGNOSIS — Z1159 Encounter for screening for other viral diseases: Secondary | ICD-10-CM | POA: Diagnosis not present

## 2021-09-26 DIAGNOSIS — G35 Multiple sclerosis: Secondary | ICD-10-CM | POA: Diagnosis not present

## 2021-09-28 DIAGNOSIS — R197 Diarrhea, unspecified: Secondary | ICD-10-CM | POA: Diagnosis not present

## 2021-09-28 DIAGNOSIS — R531 Weakness: Secondary | ICD-10-CM | POA: Diagnosis not present

## 2021-09-28 DIAGNOSIS — R112 Nausea with vomiting, unspecified: Secondary | ICD-10-CM | POA: Diagnosis not present

## 2021-09-28 DIAGNOSIS — K529 Noninfective gastroenteritis and colitis, unspecified: Secondary | ICD-10-CM | POA: Diagnosis not present

## 2021-10-03 ENCOUNTER — Telehealth: Payer: Self-pay

## 2021-10-03 ENCOUNTER — Telehealth: Payer: PPO

## 2021-10-03 NOTE — Telephone Encounter (Signed)
  Care Management   Follow Up Note   10/03/2021 Name: [name] MRN: [id number] DOB: 1965/10/03   Referred by: Laurey Morale, MD Reason for referral : Chronic Care Management (RNCM: Follow up Outreach Chronic Care Management and coordination needs-unsuccessful outreach)   An unsuccessful telephone outreach was attempted today. The patient was referred to the case management team for assistance with care management and care coordination.   Follow Up Plan: The care management team will reach out to the patient again over the next 30 days.   Peter Garter RN, Jackquline Denmark, CDE Care Management Coordinator [facility] [phone]

## 2021-11-03 ENCOUNTER — Ambulatory Visit (INDEPENDENT_AMBULATORY_CARE_PROVIDER_SITE_OTHER): Payer: PPO

## 2021-11-03 DIAGNOSIS — M5441 Lumbago with sciatica, right side: Secondary | ICD-10-CM

## 2021-11-03 DIAGNOSIS — G35 Multiple sclerosis: Secondary | ICD-10-CM

## 2021-11-03 DIAGNOSIS — I10 Essential (primary) hypertension: Secondary | ICD-10-CM

## 2021-11-03 NOTE — Chronic Care Management (AMB) (Signed)
Chronic Care Management   CCM RN Visit Note  11/03/2021 Name: [name] MRN: [id number] DOB: 1965-12-18  Subjective: [name] is a 56 y.o. year old female who is a primary care patient of Laurey Morale, MD. The care management team was consulted for assistance with disease management and care coordination needs.    Engaged with patient by telephone for follow up visit in response to provider referral for case management and/or care coordination services.   Consent to Services:  The patient was given information about Chronic Care Management services, agreed to services, and gave verbal consent prior to initiation of services.  Please see initial visit note for detailed documentation.   Patient agreed to services and verbal consent obtained.   Assessment: Review of patient past medical history, allergies, medications, health status, including review of consultants reports, laboratory and other test data, was performed as part of comprehensive evaluation and provision of chronic care management services.   SDOH (Social Determinants of Health) assessments and interventions performed:    CCM Care Plan  Allergies  Allergen Reactions   Banana Swelling    Lips, tongue and face   Sulfa Antibiotics Diarrhea and Nausea And Vomiting    Syncope episode and was incoherent   Sulfacetamide Sodium Other (See Comments)    Syncope episode and was incoherent   Penicillins Diarrhea, Nausea And Vomiting and Other (See Comments)    Has patient had a PCN reaction causing immediate rash, facial/tongue/throat swelling, SOB or lightheadedness with hypotension: Yes Has patient had a PCN reaction causing severe rash involving mucus membranes or skin necrosis: No Has patient had a PCN reaction that required hospitalization No Has patient had a PCN reaction occurring within the last 10 years: No If all of the above answers are "NO", then may proceed with Cephalosporin use.  Passed  out,     Outpatient Encounter Medications as of 11/03/2021  Medication Sig   acetaminophen (TYLENOL) 160 MG/5ML solution Take 320 mg by mouth 2 (two) times daily as needed for mild pain. Reported on 07/21/2015   chlorpheniramine-HYDROcodone (TUSSIONEX PENNKINETIC ER) 10-8 MG/5ML SUER Take 5 mLs by mouth every 12 (twelve) hours as needed for cough.   cholecalciferol (VITAMIN D) 1000 units tablet Take 1,000 Units by mouth daily. OTC   citalopram (CELEXA) 20 MG tablet Take 1 tablet (20 mg total) by mouth daily.   cyanocobalamin (,VITAMIN B-12,) 1000 MCG/ML injection Inject into the muscle.   diphenhydrAMINE (BENADRYL) 12.5 MG/5ML elixir Take 25 mg by mouth 4 (four) times daily as needed for allergies.   fluticasone (FLONASE) 50 MCG/ACT nasal spray Place into the nose.   furosemide (LASIX) 40 MG tablet Take 1 tablet (40 mg total) by mouth daily.   gabapentin (NEURONTIN) 300 MG capsule Take 1 capsule (300 mg total) by mouth 3 (three) times daily.   hydrOXYzine (ATARAX) 25 MG tablet Take 1 tablet (25 mg total) by mouth 3 (three) times daily as needed for itching.   levETIRAcetam (KEPPRA) 500 MG tablet Take 1 tablet (500 mg total) by mouth 2 (two) times daily.   levocetirizine (XYZAL) 5 MG tablet Take by mouth.   Oxcarbazepine (TRILEPTAL) 300 MG tablet Take 1 tablet (300 mg total) by mouth 2 (two) times daily.   propranolol (INDERAL) 20 MG tablet TAKE 1 OR 2 TABLETS THE MORNINGS OF MEDICAL OR DENTAL PROCEDURES   tizanidine (ZANAFLEX) 2 MG capsule Take 1 capsule (2 mg total) by mouth 2 (two) times daily.   Facility-Administered Encounter  Medications as of 11/03/2021  Medication   cyanocobalamin ((VITAMIN B-12)) injection 1,000 mcg    Patient Active Problem List   Diagnosis Date Noted   COVID-19 virus infection 01/07/2021   Low back pain due to bilateral sciatica 02/22/2018   Essential hypertension 05/30/2015   Right-sided chest pain 06/27/2014   Microcytic anemia 06/27/2014   Multiple sclerosis  exacerbation ([place]) 06/27/2014   Obesity (BMI 30-39.9) 06/27/2014   B12 deficiency 06/06/2013   Multiple sclerosis ([place]) 08/12/2010   GERD 12/30/2006   PARESTHESIA 12/30/2006    Conditions to be addressed/monitored:HTN, GERD, and MS  Care Plan : RN Care Manager Plan of Care  Updates made by [name], RN since 11/03/2021 12:00 AM  Completed 11/03/2021   Problem: Chronic Disease Management and Care Coordination Needs (MS,HTN, GERD, seizures) Resolved 11/03/2021  Priority: High     Long-Range Goal: Establish Plan of Care for Chronic Disease Management Needs (MS,HTN, GERD, seizures) Completed 11/03/2021  Start Date: 04/15/2021  Expected End Date: 10/12/2021  Priority: High  Note:   Case closed goals met Current Barriers:  Care Coordination needs related to Limited social support and ADL IADL limitations Chronic Disease Management support and education needs related to HTN, GERD, and MS,seizures  Lacks caregiver support States she is still grieving her husbands death but it is getting better every day. States her sister and other family members have been helping her with shopping and transportation and her sister has moved in with her.  States she is taking her medications as ordered.   States she is to saw her neurologist at  May and she is to have some infusions for her MS.  States she has frequent falls and uses her cane. States she keeps her phone with her all of the time. States she is trying to eat healthy and her stomach issues are gone.  RNCM Clinical Goal(s):  Patient will verbalize basic understanding of  HTN, GERD, and MS,seizures  disease process and self health management plan as evidenced by voiced adherence to plan of care take all medications exactly as prescribed and will call provider for medication related questions as evidenced by dispense report and pt verbalization attend all scheduled medical appointments:  [place] [facility] neurolog[facility] 11/10/21 as evidenced by review of  medical records demonstrate Improved adherence to prescribed treatment plan for HTN, GERD, and MS,seizures  as evidenced by readings within limits, voiced adherence to plan of care continue to work with RN Care Manager to address care management and care coordination needs related to  CAD, GERD, and MS,seizures  as evidenced by adherence to CM Team Scheduled appointments through collaboration with RN Care manager, provider, and care team.   Interventions: 1:1 collaboration with primary care provider regarding development and update of comprehensive plan of care as evidenced by provider attestation and co-signature Inter-disciplinary care team collaboration (see longitudinal plan of care) Evaluation of current treatment plan related to  self management and patient's adherence to plan as established by provider    Falls Interventions:  (Status:  Goal Met.) Long Term Goal Reviewed medications and discussed potential side effects of medications such as dizziness and frequent urination Advised patient of importance of notifying provider of falls Assessed for falls since last encounter Assessed patients knowledge of fall risk prevention secondary to previously provided education Reinforced to keep her phone with her at all times. Reinforced to use cane at all times    MS  (Status:  Goal Met.)  Long Term Goal Evaluation of current treatment  plan related to  MS , Limited social support and ADL IADL limitations self-management and patient's adherence to plan as established by provider. Discussed plans with patient for ongoing care management follow up and provided patient with direct contact information for care management team Evaluation of current treatment plan related to MS and patient's adherence to plan as established by provider Reviewed medications with patient and discussed adherence Social Work referral for grief counseling  Discussed plans with patient for ongoing care management follow up  and provided patient with direct contact information for care management team Reinforced ways to help with generalized pain of MS, to get adequate rest and to not get over heated  Hypertension Interventions:  (Status:  Goal Met.) Long Term Goal Last practice recorded BP readings:  BP Readings from Last 3 Encounters:  05/27/21 (!) 160/92  07/15/20 120/78  03/19/20 (!) 146/112  Most recent eGFR/CrCl: No results found for: EGFR  No components found for: CRCL  Evaluation of current treatment plan related to hypertension self management and patient's adherence to plan as established by provider Provided education to patient re: stroke prevention, s/s of heart attack and stroke Reviewed medications with patient and discussed importance of compliance Advised patient, providing education and rationale, to monitor blood pressure daily and record, calling PCP for findings outside established parameters Provided education on prescribed diet low sodium  Patient Goals/Self-Care Activities: Take all medications as prescribed Attend all scheduled provider appointments Call pharmacy for medication refills 3-7 days in advance of running out of medications Perform all self care activities independently  Call provider office for new concerns or questions  check blood pressure weekly write blood pressure results in a log or diary call doctor for signs and symptoms of high blood pressure eat more whole grains, fruits and vegetables, lean meats and healthy fats limit salt intake to $RemoveB'2300mg'vZjidpuT$ /day  eat healthy get at least 8 hours of sleep at night  keep the bedroom cool and dark  maintain a healthy weight  stop using electronic devices like cell phone or video game when getting ready for sleep use a fan or white noise in bedroom  use cooling vest  use devices that will help like a cane, sock-puller or reacher  use meditation or relaxation techniques  Follow Up Plan:  The patient has been provided with  contact information for the care management team and has been advised to call with any health related questions or concerns.  No further follow up required: Case closed goals met       Plan:The patient has been provided with contact information for the care management team and has been advised to call with any health related questions or concerns.  No further follow up required: Case closed goals met Peter Garter RN, [facility], CDE Care Management Coordinator [facility] [phone]

## 2021-11-03 NOTE — Patient Instructions (Signed)
Visit Information  Thank you for allowing me to share the care management and care coordination services that are available to you as part of your health plan and services through your primary care provider and medical home. Please reach out to me at [phone] if the care management/care coordination team may be of assistance to you in the future.   Glynn Yepes RN, BSN,CCM, CDE Care Management Coordinator [facility] [phone]   

## 2021-11-13 DIAGNOSIS — G35 Multiple sclerosis: Secondary | ICD-10-CM | POA: Diagnosis not present

## 2021-11-14 DIAGNOSIS — I10 Essential (primary) hypertension: Secondary | ICD-10-CM

## 2021-11-27 DIAGNOSIS — M1990 Unspecified osteoarthritis, unspecified site: Secondary | ICD-10-CM | POA: Diagnosis not present

## 2021-11-27 DIAGNOSIS — R918 Other nonspecific abnormal finding of lung field: Secondary | ICD-10-CM | POA: Diagnosis not present

## 2021-11-27 DIAGNOSIS — J9811 Atelectasis: Secondary | ICD-10-CM | POA: Diagnosis not present

## 2021-11-27 DIAGNOSIS — R519 Headache, unspecified: Secondary | ICD-10-CM | POA: Diagnosis not present

## 2021-11-27 DIAGNOSIS — I10 Essential (primary) hypertension: Secondary | ICD-10-CM | POA: Diagnosis not present

## 2021-11-27 DIAGNOSIS — G43909 Migraine, unspecified, not intractable, without status migrainosus: Secondary | ICD-10-CM | POA: Diagnosis not present

## 2021-11-27 DIAGNOSIS — G35 Multiple sclerosis: Secondary | ICD-10-CM | POA: Diagnosis not present

## 2021-11-27 DIAGNOSIS — R000 Tachycardia, unspecified: Secondary | ICD-10-CM | POA: Diagnosis not present

## 2021-11-27 DIAGNOSIS — R0789 Other chest pain: Secondary | ICD-10-CM | POA: Diagnosis not present

## 2021-11-27 DIAGNOSIS — R531 Weakness: Secondary | ICD-10-CM | POA: Diagnosis not present

## 2021-11-27 DIAGNOSIS — K219 Gastro-esophageal reflux disease without esophagitis: Secondary | ICD-10-CM | POA: Diagnosis not present

## 2021-11-27 DIAGNOSIS — E538 Deficiency of other specified B group vitamins: Secondary | ICD-10-CM | POA: Diagnosis not present

## 2021-11-27 DIAGNOSIS — G8929 Other chronic pain: Secondary | ICD-10-CM | POA: Diagnosis not present

## 2021-11-27 DIAGNOSIS — F1020 Alcohol dependence, uncomplicated: Secondary | ICD-10-CM | POA: Diagnosis not present

## 2021-11-27 DIAGNOSIS — M545 Low back pain, unspecified: Secondary | ICD-10-CM | POA: Diagnosis not present

## 2021-12-01 ENCOUNTER — Telehealth: Payer: Self-pay

## 2021-12-01 NOTE — Telephone Encounter (Signed)
This nurse attempted to call patient three times for telephonic AWV. Message left that we will call again to reschedule for another time. 

## 2021-12-05 ENCOUNTER — Telehealth: Payer: Self-pay | Admitting: Family Medicine

## 2021-12-05 NOTE — Telephone Encounter (Signed)
Pt called to ask if Izora Gala (CMA) could call her back.  Pt was asked if it was for a prescription refill, as I could help her with that.  Pt stated the call was not for that and she just wanted to ask CMA a question.   Please call her at [phone]

## 2021-12-08 NOTE — Telephone Encounter (Signed)
Spoke with pt state that she wants to see if Dr Sarajane Jews advise her to continue taking the B12 injections, pt states that she had  a flare-up on her MS diagnosis and she has been taking  MS infusions every 6 months, pt states that she has a f/u with her neurology ib Sep, Pt also request if Dr Sarajane Jews can take her sister as a new pt. Please advise

## 2021-12-08 NOTE — Telephone Encounter (Signed)
Spoke with pt advised of Dr Fry recommendation, pt verbalized understanding 

## 2021-12-08 NOTE — Telephone Encounter (Signed)
Tell her that she should continue to take B12 shots from now on. As for seeing her sister, I am sorry but I have too many patients right now

## 2021-12-14 DIAGNOSIS — G35 Multiple sclerosis: Secondary | ICD-10-CM | POA: Diagnosis not present

## 2021-12-14 DIAGNOSIS — R112 Nausea with vomiting, unspecified: Secondary | ICD-10-CM | POA: Diagnosis not present

## 2021-12-14 DIAGNOSIS — G4089 Other seizures: Secondary | ICD-10-CM | POA: Diagnosis not present

## 2021-12-14 DIAGNOSIS — R569 Unspecified convulsions: Secondary | ICD-10-CM | POA: Diagnosis not present

## 2021-12-14 DIAGNOSIS — G40909 Epilepsy, unspecified, not intractable, without status epilepticus: Secondary | ICD-10-CM | POA: Diagnosis not present

## 2021-12-14 DIAGNOSIS — N390 Urinary tract infection, site not specified: Secondary | ICD-10-CM | POA: Diagnosis not present

## 2021-12-14 DIAGNOSIS — I10 Essential (primary) hypertension: Secondary | ICD-10-CM | POA: Diagnosis not present

## 2021-12-14 DIAGNOSIS — R9431 Abnormal electrocardiogram [ECG] [EKG]: Secondary | ICD-10-CM | POA: Diagnosis not present

## 2021-12-15 DIAGNOSIS — H40013 Open angle with borderline findings, low risk, bilateral: Secondary | ICD-10-CM | POA: Diagnosis not present

## 2021-12-16 DIAGNOSIS — Z0000 Encounter for general adult medical examination without abnormal findings: Secondary | ICD-10-CM

## 2021-12-16 NOTE — Progress Notes (Signed)
Patient not feeling well enough to continue This encounter was created in error - please disregard.

## 2021-12-17 ENCOUNTER — Telehealth: Payer: Self-pay | Admitting: Family Medicine

## 2021-12-17 NOTE — Telephone Encounter (Signed)
Pt was in [facility] 2 weeks ago for MS relapse passed out non responsive, local ED for seizure/UTI on Sunday given antibiotics.allergic to penicillin and sulfur was still prescribed medications with these elements. Next available OV is 12/22/21, patient requesting call from care team because she does not want to wait that long for her pcp

## 2021-12-18 NOTE — Telephone Encounter (Signed)
Spoke with pt states that she went to the ED for a bad UTI and was given Antibiotics which ,made her sick, pt states that she is allergic to penicillins which is the Antibiotics prescribed. Pt refuses to see another provider in the office tomorrow, states that she will only see Dr Sarajane Jews, pt was advised that Dr Sarajane Jews does not have openings  tomorrow offered to see another Dr but pt declined requesting  for Dr Sarajane Jews to advise since she stopped taking the Antibiotics and is afraid that the infection is still not cleared. Please advise

## 2021-12-18 NOTE — Telephone Encounter (Signed)
Pt is calling and requesting for nancy to return her call. Pt does not want to see another provider

## 2021-12-19 ENCOUNTER — Other Ambulatory Visit: Payer: Self-pay

## 2021-12-19 MED ORDER — PROMETHAZINE HCL 25 MG PO TABS: 25.0000 mg | ORAL_TABLET | Freq: Three times a day (TID) | ORAL | 2 refills | Status: AC | PRN

## 2021-12-19 MED ORDER — CIPROFLOXACIN HCL 500 MG PO TABS: 500.0000 mg | ORAL_TABLET | Freq: Two times a day (BID) | ORAL | 0 refills | Status: AC

## 2021-12-19 NOTE — Telephone Encounter (Signed)
Call in Phenergan 25 mg to take every 6 hours as needed for nausea, #60 with 2 rf

## 2021-12-19 NOTE — Telephone Encounter (Signed)
Rx for Cipro was sent to pt pharmacy and pt is aware. Pt state that she has been having nausea and she was prescribed Zofran at the ED but pt insurance does not cover prescription. Pt requests for a cheaper prescription for nausea since she keep having episodes. Please advise

## 2021-12-19 NOTE — Telephone Encounter (Signed)
Rx sent 

## 2021-12-19 NOTE — Telephone Encounter (Signed)
Call in Cipro 500 mg BID for 7 days  

## 2021-12-21 ENCOUNTER — Other Ambulatory Visit: Payer: Self-pay | Admitting: Family Medicine

## 2022-01-01 DIAGNOSIS — R4189 Other symptoms and signs involving cognitive functions and awareness: Secondary | ICD-10-CM | POA: Diagnosis not present

## 2022-01-01 DIAGNOSIS — G35 Multiple sclerosis: Secondary | ICD-10-CM | POA: Diagnosis not present

## 2022-01-20 ENCOUNTER — Ambulatory Visit (INDEPENDENT_AMBULATORY_CARE_PROVIDER_SITE_OTHER): Payer: PPO

## 2022-01-20 VITALS — Ht 63.0 in | Wt 250.0 lb

## 2022-01-20 DIAGNOSIS — Z0000 Encounter for general adult medical examination without abnormal findings: Secondary | ICD-10-CM

## 2022-01-20 NOTE — Patient Instructions (Signed)
[name] , Thank you for taking time to come for your Medicare Wellness Visit. I appreciate your ongoing commitment to your health goals. Please review the following plan we discussed and let me know if I can assist you in the future.   Screening recommendations/referrals: Colonoscopy: completed 11/28/2020 Mammogram: patient to schedule Bone Density: n/a Recommended yearly ophthalmology/optometry visit for glaucoma screening and checkup Recommended yearly dental visit for hygiene and checkup  Vaccinations: Influenza vaccine: declined Pneumococcal vaccine: n/a Tdap vaccine: completed 03/29/2015, due 03/28/2025 Shingles vaccine: discussed  Covid-19:  10/20/2019, 09/22/2019  Advanced directives: Advance directive discussed with you today.   Conditions/risks identified: none  Next appointment: Follow up in one year for your annual wellness visit.   Preventive Care 40-64 Years, Female Preventive care refers to lifestyle choices and visits with your health care provider that can promote health and wellness. What does preventive care include? A yearly physical exam. This is also called an annual well check. Dental exams once or twice a year. Routine eye exams. Ask your health care provider how often you should have your eyes checked. Personal lifestyle choices, including: Daily care of your teeth and gums. Regular physical activity. Eating a healthy diet. Avoiding tobacco and drug use. Limiting alcohol use. Practicing safe sex. Taking low-dose aspirin daily starting at age 24. Taking vitamin and mineral supplements as recommended by your health care provider. What happens during an annual well check? The services and screenings done by your health care provider during your annual well check will depend on your age, overall health, lifestyle risk factors, and family history of disease. Counseling  Your health care provider may ask you questions about your: Alcohol use. Tobacco  use. Drug use. Emotional well-being. Home and relationship well-being. Sexual activity. Eating habits. Work and work Statistician. Method of birth control. Menstrual cycle. Pregnancy history. Screening  You may have the following tests or measurements: Height, weight, and BMI. Blood pressure. Lipid and cholesterol levels. These may be checked every 5 years, or more frequently if you are over 44 years old. Skin check. Lung cancer screening. You may have this screening every year starting at age 89 if you have a 30-pack-year history of smoking and currently smoke or have quit within the past 15 years. Fecal occult blood test (FOBT) of the stool. You may have this test every year starting at age 38. Flexible sigmoidoscopy or colonoscopy. You may have a sigmoidoscopy every 5 years or a colonoscopy every 10 years starting at age 68. Hepatitis C blood test. Hepatitis B blood test. Sexually transmitted disease (STD) testing. Diabetes screening. This is done by checking your blood sugar (glucose) after you have not eaten for a while (fasting). You may have this done every 1-3 years. Mammogram. This may be done every 1-2 years. Talk to your health care provider about when you should start having regular mammograms. This may depend on whether you have a family history of breast cancer. BRCA-related cancer screening. This may be done if you have a family history of breast, ovarian, tubal, or peritoneal cancers. Pelvic exam and Pap test. This may be done every 3 years starting at age 68. Starting at age 46, this may be done every 5 years if you have a Pap test in combination with an HPV test. Bone density scan. This is done to screen for osteoporosis. You may have this scan if you are at high risk for osteoporosis. Discuss your test results, treatment options, and if necessary, the need for more  tests with your health care provider. Vaccines  Your health care provider may recommend certain vaccines,  such as: Influenza vaccine. This is recommended every year. Tetanus, diphtheria, and acellular pertussis (Tdap, Td) vaccine. You may need a Td booster every 10 years. Zoster vaccine. You may need this after age 19. Pneumococcal 13-valent conjugate (PCV13) vaccine. You may need this if you have certain conditions and were not previously vaccinated. Pneumococcal polysaccharide (PPSV23) vaccine. You may need one or two doses if you smoke cigarettes or if you have certain conditions. Talk to your health care provider about which screenings and vaccines you need and how often you need them. This information is not intended to replace advice given to you by your health care provider. Make sure you discuss any questions you have with your health care provider. Document Released: 05/31/2015 Document Revised: 01/22/2016 Document Reviewed: 03/05/2015 Elsevier Interactive Patient Education  2017 [place] Prevention in the Home Falls can cause injuries. They can happen to people of all ages. There are many things you can do to make your home safe and to help prevent falls. What can I do on the outside of my home? Regularly fix the edges of walkways and driveways and fix any cracks. Remove anything that might make you trip as you walk through a door, such as a raised step or threshold. Trim any bushes or trees on the path to your home. Use bright outdoor lighting. Clear any walking paths of anything that might make someone trip, such as rocks or tools. Regularly check to see if handrails are loose or broken. Make sure that both sides of any steps have handrails. Any raised decks and porches should have guardrails on the edges. Have any leaves, snow, or ice cleared regularly. Use sand or salt on walking paths during winter. Clean up any spills in your garage right away. This includes oil or grease spills. What can I do in the bathroom? Use night lights. Install grab bars by the toilet and  in the tub and shower. Do not use towel bars as grab bars. Use non-skid mats or decals in the tub or shower. If you need to sit down in the shower, use a plastic, non-slip stool. Keep the floor dry. Clean up any water that spills on the floor as soon as it happens. Remove soap buildup in the tub or shower regularly. Attach bath mats securely with double-sided non-slip rug tape. Do not have throw rugs and other things on the floor that can make you trip. What can I do in the bedroom? Use night lights. Make sure that you have a light by your bed that is easy to reach. Do not use any sheets or blankets that are too big for your bed. They should not hang down onto the floor. Have a firm chair that has side arms. You can use this for support while you get dressed. Do not have throw rugs and other things on the floor that can make you trip. What can I do in the kitchen? Clean up any spills right away. Avoid walking on wet floors. Keep items that you use a lot in easy-to-reach places. If you need to reach something above you, use a strong step stool that has a grab bar. Keep electrical cords out of the way. Do not use floor polish or wax that makes floors slippery. If you must use wax, use non-skid floor wax. Do not have throw rugs and other things on  the floor that can make you trip. What can I do with my stairs? Do not leave any items on the stairs. Make sure that there are handrails on both sides of the stairs and use them. Fix handrails that are broken or loose. Make sure that handrails are as long as the stairways. Check any carpeting to make sure that it is firmly attached to the stairs. Fix any carpet that is loose or worn. Avoid having throw rugs at the top or bottom of the stairs. If you do have throw rugs, attach them to the floor with carpet tape. Make sure that you have a light switch at the top of the stairs and the bottom of the stairs. If you do not have them, ask someone to add  them for you. What else can I do to help prevent falls? Wear shoes that: Do not have high heels. Have rubber bottoms. Are comfortable and fit you well. Are closed at the toe. Do not wear sandals. If you use a stepladder: Make sure that it is fully opened. Do not climb a closed stepladder. Make sure that both sides of the stepladder are locked into place. Ask someone to hold it for you, if possible. Clearly mark and make sure that you can see: Any grab bars or handrails. First and last steps. Where the edge of each step is. Use tools that help you move around (mobility aids) if they are needed. These include: Canes. Walkers. Scooters. Crutches. Turn on the lights when you go into a dark area. Replace any light bulbs as soon as they burn out. Set up your furniture so you have a clear path. Avoid moving your furniture around. If any of your floors are uneven, fix them. If there are any pets around you, be aware of where they are. Review your medicines with your doctor. Some medicines can make you feel dizzy. This can increase your chance of falling. Ask your doctor what other things that you can do to help prevent falls. This information is not intended to replace advice given to you by your health care provider. Make sure you discuss any questions you have with your health care provider. Document Released: 02/28/2009 Document Revised: 10/10/2015 Document Reviewed: 06/08/2014 Elsevier Interactive Patient Education  2017 [employer].

## 2022-01-20 NOTE — Progress Notes (Cosign Needed Addendum)
I connected with [name] today by telephone and verified that I am speaking with the correct person using two identifiers. Location patient: home Location provider: work Persons participating in the virtual visit: Delane, Stalling LPN.   I discussed the limitations, risks, security and privacy concerns of performing an evaluation and management service by telephone and the availability of in person appointments. I also discussed with the patient that there may be a patient responsible charge related to this service. The patient expressed understanding and verbally consented to this telephonic visit.    Interactive audio and video telecommunications were attempted between this provider and patient, however failed, due to patient having technical difficulties OR patient did not have access to video capability.  We continued and completed visit with audio only.     Vital signs may be patient reported or missing.  Subjective:   [name] is a 56 y.o. female who presents for Medicare Annual (Subsequent) preventive examination.  Review of Systems     Cardiac Risk Factors include: hypertension;obesity (BMI >30kg/m2)     Objective:    Today's Vitals   01/20/22 1156 01/20/22 1157  Weight: 250 lb (113.4 kg)   Height: '5\' 3"'$  (1.6 m)   PainSc:  9    Body mass index is 44.29 kg/m.     01/20/2022   12:10 PM 12/12/2020    7:28 PM 11/22/2020    1:07 PM 11/12/2020   11:31 AM 06/02/2018    2:18 PM 11/29/2016    2:10 PM 10/04/2016   11:24 AM  Advanced Directives  Does Patient Have a Medical Advance Directive? No Yes No Yes No No No  Type of Corporate treasurer of [place];Living will  [place];Living will     Does patient want to make changes to medical advance directive?  No - Patient declined       Copy of [place] in Chart?  No - copy requested  No - copy requested     Would patient like information  on creating a medical advance directive?  No - Patient declined Yes (MAU/Ambulatory/Procedural Areas - Information given)  No - Patient declined      Current Medications (verified) Outpatient Encounter Medications as of 01/20/2022  Medication Sig   acetaminophen (TYLENOL) 160 MG/5ML solution Take 320 mg by mouth 2 (two) times daily as needed for mild pain. Reported on 07/21/2015   chlorpheniramine-HYDROcodone (TUSSIONEX PENNKINETIC ER) 10-8 MG/5ML SUER Take 5 mLs by mouth every 12 (twelve) hours as needed for cough.   cholecalciferol (VITAMIN D) 1000 units tablet Take 1,000 Units by mouth daily. OTC   citalopram (CELEXA) 20 MG tablet Take 1 tablet (20 mg total) by mouth daily.   cyanocobalamin (,VITAMIN B-12,) 1000 MCG/ML injection Inject into the muscle.   diphenhydrAMINE (BENADRYL) 12.5 MG/5ML elixir Take 25 mg by mouth 4 (four) times daily as needed for allergies.   fluticasone (FLONASE) 50 MCG/ACT nasal spray Place into the nose.   furosemide (LASIX) 40 MG tablet Take 1 tablet (40 mg total) by mouth daily.   gabapentin (NEURONTIN) 300 MG capsule Take 1 capsule (300 mg total) by mouth 3 (three) times daily.   hydrOXYzine (ATARAX) 25 MG tablet Take 1 tablet (25 mg total) by mouth 3 (three) times daily as needed for itching.   levETIRAcetam (KEPPRA) 500 MG tablet Take 1 tablet (500 mg total) by mouth 2 (two) times daily.   levocetirizine (XYZAL) 5 MG tablet Take  by mouth.   Oxcarbazepine (TRILEPTAL) 300 MG tablet TAKE 1 TABLET BY MOUTH 2 TIMES DAILY.   promethazine (PHENERGAN) 25 MG tablet Take 1 tablet (25 mg total) by mouth every 8 (eight) hours as needed for nausea or vomiting.   propranolol (INDERAL) 20 MG tablet TAKE 1 OR 2 TABLETS THE MORNINGS OF MEDICAL OR DENTAL PROCEDURES   tizanidine (ZANAFLEX) 2 MG capsule Take 1 capsule (2 mg total) by mouth 2 (two) times daily.   Facility-Administered Encounter Medications as of 01/20/2022  Medication   cyanocobalamin ((VITAMIN B-12)) injection 1,000  mcg    Allergies (verified) Banana, Sulfa antibiotics, Sulfacetamide sodium, and Penicillins   History: Past Medical History:  Diagnosis Date   Anemia    GERD 12/30/2006   Headache(784.0)    Hypertension    Multiple sclerosis ([place])    PARESTHESIA 12/30/2006   Seizures ([place])    Stroke [facility])    Past Surgical History:  Procedure Laterality Date   ANAL INTRAEPITHELIAL NEOPLASIA EXCISION     BREAST BIOPSY     CESAREAN SECTION     EYE SURGERY Right    Myometomy     TONSILLECTOMY     Family History  Problem Relation Age of Onset   Dementia Mother    Hypertension Mother    Diabetes Mother    Stroke Father    Hypertension Father    Psychosis Sister    Hypertension Other    Diabetes Other    Dementia Other    Dementia Maternal Grandmother    Stroke Maternal Grandmother    Social History   Socioeconomic History   Marital status: Widowed    Spouse name: [name]   Number of children: 0   Years of education: [employer] education level: Bachelor's degree (e.g., BA, AB, BS)  Occupational History    Employer: [employer]   Occupation: RESEARCH ANALYST    Employer: [employer]  Tobacco Use   Smoking status: Never    Passive exposure: Past   Smokeless tobacco: Never  Vaping Use   Vaping Use: Never used  Substance and Sexual Activity   Alcohol use: No   Drug use: No   Sexual activity: Not Currently  Other Topics Concern   Not on file  Social History Narrative   Pt lives at home family.   Caffeine Use: 2 sodas daily   Social Determinants of Health   Financial Resource Strain: Low Risk  (01/20/2022)   Overall Financial Resource Strain (CARDIA)    Difficulty of Paying Living Expenses: Not hard at all  Food Insecurity: No Food Insecurity (01/20/2022)   Hunger Vital Sign    Worried About Running Out of Food in the Last Year: Never true    Ran Out of Food in the Last Year: Never true  Transportation Needs: No Transportation Needs (01/20/2022)   PRAPARE -  Hydrologist (Medical): No    Lack of Transportation (Non-Medical): No  Physical Activity: Insufficiently Active (01/20/2022)   Exercise Vital Sign    Days of Exercise per Week: 7 days    Minutes of Exercise per Session: 20 min  Stress: No Stress Concern Present (01/20/2022)   [place]    Feeling of Stress : Not at all  Social Connections: Moderately Isolated (12/12/2020)   Social Connection and Isolation Panel [NHANES]    Frequency of Communication with Friends and Family: More than three times a week  Frequency of Social Gatherings with Friends and Family: More than three times a week    Attends Religious Services: Never    Marine scientist or Organizations: No    Attends Music therapist: Never    Marital Status: Married    Tobacco Counseling Counseling given: Not Answered   Clinical Intake:  Pre-visit preparation completed: Yes  Pain : 0-10 Pain Score: 9  Pain Type: Chronic pain Pain Location: Generalized Pain Descriptors / Indicators: Aching Pain Onset: More than a month ago Pain Frequency: Constant     Nutritional Status: BMI > 30  Obese Nutritional Risks: Nausea/ vomitting/ diarrhea (nausea daily due to MS and vomiting due to MS) Diabetes: No  How often do you need to have someone help you when you read instructions, pamphlets, or other written materials from your doctor or pharmacy?: 1 - Never What is the last grade level you completed in school?: business college  Diabetic? no  Interpreter Needed?: No  Information entered by :: NAllen LPN   Activities of Daily Living    01/20/2022   12:16 PM  In your present state of health, do you have any difficulty performing the following activities:  Hearing? 0  Vision? 0  Difficulty concentrating or making decisions? 1  Walking or climbing stairs? 1  Dressing or bathing? 0  Doing errands, shopping?  1  Preparing Food and eating ? Y  Using the Toilet? N  In the past six months, have you accidently leaked urine? Y  Do you have problems with loss of bowel control? Y  Managing your Medications? N  Managing your Finances? N  Housekeeping or managing your Housekeeping? Y    Patient Care Team: Laurey Morale, MD as PCP - General (Family Medicine) [name], MD as Referring Physician (Neurology) [name], [facility] as Pharmacist (Pharmacist)  Indicate any recent Medical Services you may have received from other than [facility] providers in the past year (date may be approximate).     Assessment:   This is a routine wellness examination for Moani.  Hearing/Vision screen Vision Screening - Comments:: Regular eye exams, [name]  Dietary issues and exercise activities discussed: Current Exercise Habits: Home exercise routine, Type of exercise: walking, Time (Minutes): 20, Frequency (Times/Week): 7, Weekly Exercise (Minutes/Week): 140   Goals Addressed             This Visit's Progress    Patient Stated       01/20/2022, no goals       Depression Screen    01/20/2022   12:16 PM 12/12/2020    7:23 PM 11/22/2020    1:26 PM 11/12/2020   11:34 AM 03/19/2020    9:22 AM  PHQ 2/9 Scores  PHQ - 2 Score 0 1 0 1 0    Fall Risk    01/20/2022   12:14 PM 11/03/2021    2:10 PM 07/11/2021    2:25 PM 02/14/2021    2:07 PM 12/12/2020    7:22 PM  Fall Risk   Falls in the past year? '1 1 1 1 1  '$ Comment legs gave out      Number falls in past yr: '1 1 1 1 1  '$ Injury with Fall? 1 0 0 0 1  Risk for fall due to : Impaired balance/gait;Impaired mobility;Medication side effect History of fall(s);Impaired balance/gait;Impaired mobility History of fall(s);Impaired balance/gait;Impaired mobility Impaired balance/gait;Impaired mobility;History of fall(s) History of fall(s);Impaired balance/gait;Impaired mobility  Follow up Falls evaluation  completed;Education provided;Falls prevention  discussed Education provided;Falls prevention discussed Education provided;Falls prevention discussed Education provided;Falls prevention discussed Falls evaluation completed;Education provided;Falls prevention discussed    FALL RISK PREVENTION PERTAINING TO THE HOME:  Any stairs in or around the home? Yes  If so, are there any without handrails? No  Home free of loose throw rugs in walkways, pet beds, electrical cords, etc? Yes  Adequate lighting in your home to reduce risk of falls? Yes   ASSISTIVE DEVICES UTILIZED TO PREVENT FALLS:  Life alert? No  Use of a cane, walker or w/c? Yes  Grab bars in the bathroom? Yes  Shower chair or bench in shower? Yes  Elevated toilet seat or a handicapped toilet? Yes   TIMED UP AND GO:  Was the test performed? No .      Cognitive Function:        01/20/2022   12:20 PM  6CIT Screen  What Year? 0 points  What month? 0 points  What time? 0 points  Count back from 20 0 points  Months in reverse 4 points  Repeat phrase 6 points  Total Score 10 points    Immunizations Immunization History  Administered Date(s) Administered   PPD Test 07/09/2014   Td 05/18/2000   Tdap 03/29/2015    TDAP status: Up to date  Flu Vaccine status: Declined, Education has been provided regarding the importance of this vaccine but patient still declined. Advised may receive this vaccine at local pharmacy or Health Dept. Aware to provide a copy of the vaccination record if obtained from local pharmacy or Health Dept. Verbalized acceptance and understanding.  Pneumococcal vaccine status: Up to date  Covid-19 vaccine status: Completed vaccines  Qualifies for Shingles Vaccine? Yes   Zostavax completed No   Shingrix Completed?: No.    Education has been provided regarding the importance of this vaccine. Patient has been advised to call insurance company to determine out of pocket expense if they have not yet received this vaccine. Advised may also receive  vaccine at local pharmacy or Health Dept. Verbalized acceptance and understanding.  Screening Tests Health Maintenance  Topic Date Due   Hepatitis C Screening  Never done   Zoster Vaccines- Shingrix (1 of 2) Never done   PAP SMEAR-Modifier  05/01/2019   COVID-19 Vaccine (3 - Pfizer risk series) 11/17/2019   MAMMOGRAM  08/07/2020   INFLUENZA VACCINE  Never done   TETANUS/TDAP  03/28/2025   COLONOSCOPY (Pts 45-70yr Insurance coverage will need to be confirmed)  11/29/2030   HIV Screening  Completed   HPV VACCINES  Aged Out    Health Maintenance  Health Maintenance Due  Topic Date Due   Hepatitis C Screening  Never done   Zoster Vaccines- Shingrix (1 of 2) Never done   PAP SMEAR-Modifier  05/01/2019   COVID-19 Vaccine (3 - Pfizer risk series) 11/17/2019   MAMMOGRAM  08/07/2020   INFLUENZA VACCINE  Never done    Colorectal cancer screening: Type of screening: Colonoscopy. Completed 11/28/2020. Repeat every 10 years  Mammogram status: patient to schedule  Bone Density status: n/a  Lung Cancer Screening: (Low Dose CT Chest recommended if Age 54-80years, 30 pack-year currently smoking OR have quit w/in 15years.) does not qualify.   Lung Cancer Screening Referral: no  Additional Screening:  Hepatitis C Screening: does qualify;  Vision Screening: Recommended annual ophthalmology exams for early detection of glaucoma and other disorders of the eye. Is the patient up to date with their annual eye  exam?  Yes  Who is the provider or what is the name of the office in which the patient attends annual eye exams? [name] If pt is not established with a provider, would they like to be referred to a provider to establish care? No .   Dental Screening: Recommended annual dental exams for proper oral hygiene  Community Resource Referral / Chronic Care Management: CRR required this visit?  No   CCM required this visit?  No      Plan:     I have personally reviewed and  noted the following in the patient's chart:   Medical and social history Use of alcohol, tobacco or illicit drugs  Current medications and supplements including opioid prescriptions. Patient is not currently taking opioid prescriptions. Functional ability and status Nutritional status Physical activity Advanced directives List of other physicians Hospitalizations, surgeries, and ER visits in previous 12 months Vitals Screenings to include cognitive, depression, and falls Referrals and appointments  In addition, I have reviewed and discussed with patient certain preventive protocols, quality metrics, and best practice recommendations. A written personalized care plan for preventive services as well as general preventive health recommendations were provided to patient.     [name], LPN   11/18/2593   Nurse Notes: none  Due to this being a virtual visit, the after visit summary with patients personalized plan was offered to patient via mail or my-chart. to pick up at office at next visit

## 2022-01-24 ENCOUNTER — Other Ambulatory Visit: Payer: Self-pay | Admitting: Family Medicine

## 2022-01-29 ENCOUNTER — Telehealth: Payer: Self-pay | Admitting: Pharmacist

## 2022-01-29 NOTE — Chronic Care Management (AMB) (Signed)
Chronic Care Management Pharmacy Assistant   Name: [name]  MRN: [id number] DOB: [birth year]  Reason for Encounter: Disease State    Conditions to be addressed/monitored: General Assessment  Recent office visits:  01/20/22 Kathleen Simmering, LPN - Patient presented for Medicare Annual Wellness Exam. No medication changes.  11/03/21 Kathleen Ped, RN - Patient presented for Nurse CCM Visit. No medication changes.  Recent consult visits:  01/01/22 Kathleen Feathers, MD - Patient presented for Multiple Sclerosis. No medication changes.  12/15/21 [name], Kathleen (Optometry) - Claims  encounter for PR Refraction and other concerns. No other visit details available.  11/27/21 Kathleen Cuff, MD - Patient presented for infusion had post reaction and was transferred to ED.  11/13/21 Kathleen Forster, RN  - Patient presented for Infusion. No medication changes.  09/26/21 Kathleen Feathers, MD - Patient presented for Multiple Sclerosis. No medication changes.    Hospital visits:  Medication Reconciliation was completed by comparing discharge summary, patient's EMR and Pharmacy list, and upon discussion with patient.  Patient presented to [facility] ED on 12/14/21 due to Seizures and Vomiting. Patient was present for 4 hours.  New?Medications Started at [facility] Discharge:?? -started  Zofran Keflex  Medication Changes at Hospital Discharge: -Changed  none  Medications Discontinued at Hospital Discharge: -Stopped  none  Medications that remain the same after Hospital Discharge:??  -All other medications will remain the same.     Patient presented to [facility] ED on 11/27/21 due to Neurological issues and other concerns.  New?Medications Started at [facility] Discharge:?? -started  meclizine (ANTIVERT) 25   Medication Changes at Hospital Discharge: -Changed  none  Medications Discontinued at Hospital  Discharge: -Stopped  none  Medications that remain the same after Hospital Discharge:??  -All other medications will remain the same.      Patient presented to [facility]  on 09/28/21 due to vomiting and diarrhea.  New?Medications Started at [facility] Discharge:?? -started  Zofran  Medication Changes at Hospital Discharge: -Changed  none  Medications Discontinued at Hospital Discharge: -Stopped  none  Medications that remain the same after Hospital Discharge:??  -All other medications will remain the same.     Medications: Outpatient Encounter Medications as of 01/29/2022  Medication Sig   acetaminophen (TYLENOL) 160 MG/5ML solution Take 320 mg by mouth 2 (two) times daily as needed for mild pain. Reported on 07/21/2015   chlorpheniramine-HYDROcodone (TUSSIONEX PENNKINETIC ER) 10-8 MG/5ML SUER Take 5 mLs by mouth every 12 (twelve) hours as needed for cough.   cholecalciferol (VITAMIN D) 1000 units tablet Take 1,000 Units by mouth daily. OTC   citalopram (CELEXA) 20 MG tablet Take 1 tablet (20 mg total) by mouth daily.   cyanocobalamin (,VITAMIN B-12,) 1000 MCG/ML injection Inject into the muscle.   diphenhydrAMINE (BENADRYL) 12.5 MG/5ML elixir Take 25 mg by mouth 4 (four) times daily as needed for allergies.   fluticasone (FLONASE) 50 MCG/ACT nasal spray Place into the nose.   furosemide (LASIX) 40 MG tablet Take 1 tablet (40 mg total) by mouth daily.   gabapentin (NEURONTIN) 300 MG capsule Take 1 capsule (300 mg total) by mouth 3 (three) times daily.   hydrOXYzine (ATARAX) 25 MG tablet Take 1 tablet (25 mg total) by mouth 3 (three) times daily as needed for itching.   levETIRAcetam (KEPPRA) 500 MG tablet Take 1 tablet (500 mg total) by mouth 2 (two) times daily.   levocetirizine (XYZAL) 5  MG tablet Take by mouth.   Oxcarbazepine (TRILEPTAL) 300 MG tablet TAKE 1 TABLET BY MOUTH 2 TIMES DAILY.   promethazine (PHENERGAN) 25 MG tablet Take 1 tablet (25 mg  total) by mouth every 8 (eight) hours as needed for nausea or vomiting.   propranolol (INDERAL) 20 MG tablet TAKE 1 OR 2 TABLETS THE MORNINGS OF MEDICAL OR DENTAL PROCEDURES   tizanidine (ZANAFLEX) 2 MG capsule Take 1 capsule (2 mg total) by mouth 2 (two) times daily.   tiZANidine (ZANAFLEX) 2 MG tablet TAKE 1 TABLET (2 MG TOTAL) BY MOUTH 2 (TWO) TIMES DAILY. SPOKE WITH OFFICE, TAB OK   Facility-Administered Encounter Medications as of 01/29/2022  Medication   cyanocobalamin ((VITAMIN B-12)) injection 1,000 mcg  Contacted [name] for General Review Call  Adherence Review:  Does the Clinical Pharmacist Assistant have access to adherence rates? Yes Adherence rates for STAR metric medications  No star rated med's  Disease State Questions:  Able to connect with Patient? Yes Did patient have any problems with their health recently? No  Have you had any admissions or emergency room visits or worsening of your condition(s) since last visit? Yes Patient reports she had a MS relapse and was started on infusions but they made her sick so she will now be doing the chemo shots once a month. Have you had any visits with new specialists or providers since your last visit? Yes Explain:for MS Have you had any new health care problem(s) since your last visit? No  Have you run out of any of your medications since you last spoke with clinical pharmacist? No  Are there any medications you are not taking as prescribed? No  Are you having any issues or side effects with your medications? No  Do you have any other health concerns or questions you want to discuss with your Clinical Pharmacist before your next visit? No  Are there any health concerns that you feel we can do a better job addressing? No  Are you having any problems with any of the following since the last visit: (select all that apply)  None : 12. Any falls since last visit? No   13. Any increased or uncontrolled pain  since last visit? Yes  Details: Patient reports she is constantly in pain with her MS 14. Next visit Type: telephone       Visit with: MP 11/23         15. Additional Details? Patient reports she needs an updated prescription for her Tizanidine sent to [facility] as her insurance will only cover the tabs and the capsules were sent in. Forwarded to MP for assistance   Care Gaps: CCM - 04/01/22 Hep C Screening - Overdue Zoster Vaccine - Overdue Pap Smear - Overdue COVID Booster - Overdue Mammogram - Overdue Flu Vaccine - Overdue CCM - 11/23 BP- 157/98 9/18 AWV- 6/22  Star Rating Drugs: None   Ned Clines [place] Clinical Pharmacist Assistant [phone]

## 2022-01-30 DIAGNOSIS — G35 Multiple sclerosis: Secondary | ICD-10-CM | POA: Diagnosis not present

## 2022-01-30 DIAGNOSIS — E882 Lipomatosis, not elsewhere classified: Secondary | ICD-10-CM | POA: Diagnosis not present

## 2022-02-02 DIAGNOSIS — G35 Multiple sclerosis: Secondary | ICD-10-CM | POA: Diagnosis not present

## 2022-02-20 ENCOUNTER — Other Ambulatory Visit: Payer: Self-pay | Admitting: Family Medicine

## 2022-02-20 NOTE — Telephone Encounter (Signed)
Last refill-07/10/21---60 tab, 3 refills Last OV-05/27/21  No future OV scheduled.

## 2022-03-17 ENCOUNTER — Encounter: Payer: Self-pay | Admitting: Family Medicine

## 2022-03-17 ENCOUNTER — Ambulatory Visit (INDEPENDENT_AMBULATORY_CARE_PROVIDER_SITE_OTHER): Payer: PPO | Admitting: Family Medicine

## 2022-03-17 ENCOUNTER — Ambulatory Visit: Payer: PPO | Admitting: Family

## 2022-03-17 VITALS — BP 120/88 | HR 100 | Temp 98.9°F | Ht 63.0 in | Wt 257.7 lb

## 2022-03-17 DIAGNOSIS — N3000 Acute cystitis without hematuria: Secondary | ICD-10-CM

## 2022-03-17 MED ORDER — DOXYCYCLINE HYCLATE 100 MG PO TABS: 100.0000 mg | ORAL_TABLET | Freq: Two times a day (BID) | ORAL | 0 refills | Status: AC

## 2022-03-17 NOTE — Progress Notes (Signed)
   Established Patient Office Visit  Subjective   Patient ID: [name], female    DOB: 1965-11-17  Age: 56 y.o. MRN: [id number]  Chief Complaint  Patient presents with   Dysuria    X2 days    Patient has a 2 day history of dysuria, states that she had a very bad UTI in July 2023, she had gone to the ER for evaluation at that time. No fever/chills, but she is having a severe headache. Has been taking tylenol every 4-5 hours. Is having increased frequency also. Patient has a history of MS. She was given cephalexin which made her sick so she was switched to cipro. I reviewed the records from the ED, she grew E coli which was pansensitive.   States that prior to this infection she had just had chemo for her MS treatment. States she has another chemo session scheduled in December.  Dysuria  This is a new problem. The current episode started yesterday. The problem occurs every urination.      Review of Systems  Genitourinary:  Positive for dysuria.  All other systems reviewed and are negative.     Objective:     BP 120/88 (BP Location: Left Arm, Patient Position: Sitting, Cuff Size: Large)   Pulse 100   Temp 98.9 F (37.2 C) (Oral)   Ht '5\' 3"'$  (1.6 m)   Wt 257 lb 11.2 oz (116.9 kg)   SpO2 99%   BMI 45.65 kg/m    Physical Exam Vitals reviewed.  Constitutional:      Appearance: Normal appearance. She is well-groomed. She is obese.  Neck:     Thyroid: No thyromegaly.  Cardiovascular:     Rate and Rhythm: Normal rate and regular rhythm.     Heart sounds: S1 normal and S2 normal.  Pulmonary:     Effort: Pulmonary effort is normal.     Breath sounds: Normal breath sounds and air entry.  Abdominal:     Tenderness: There is no right CVA tenderness or left CVA tenderness.  Neurological:     Mental Status: She is alert and oriented to person, place, and time. Mental status is at baseline.     Gait: Gait is intact.  Psychiatric:        Mood and Affect: Mood and  affect normal.        Speech: Speech normal.      No results found for any visits on 03/17/22.    The ASCVD Risk score (Arnett DK, et al., 2019) failed to calculate for the following reasons:   Cannot find a previous HDL lab   Cannot find a previous total cholesterol lab    Assessment & Plan:   Problem List Items Addressed This Visit   None Visit Diagnoses     Acute cystitis without hematuria    -  Primary   Relevant Medications   Patient was treated with cephalexin, then cipro just 2 1/2 months ago, will switch antibiotics to doxycycline 100 mg BID for 7 days. (The e.coli she grew in August was sensitive to tetracycline.)  Pt is allergic to sulfa and PCN. I advised that she should notify us if she gets another UTI in the next few months. If she does then she may need once daily suppressive therapy.   doxycycline (VIBRA-TABS) 100 MG tablet       No follow-ups on file.    Farrel Conners, MD

## 2022-04-01 ENCOUNTER — Ambulatory Visit (INDEPENDENT_AMBULATORY_CARE_PROVIDER_SITE_OTHER): Payer: PPO | Admitting: Pharmacist

## 2022-04-01 ENCOUNTER — Telehealth: Payer: Self-pay | Admitting: Pharmacist

## 2022-04-01 DIAGNOSIS — K2101 Gastro-esophageal reflux disease with esophagitis, with bleeding: Secondary | ICD-10-CM

## 2022-04-01 DIAGNOSIS — I10 Essential (primary) hypertension: Secondary | ICD-10-CM

## 2022-04-01 NOTE — Chronic Care Management (AMB) (Signed)
    Chronic Care Management Pharmacy Assistant   Name: [name]  MRN: [id number] DOB: 01-28-1966  04/01/22 APPOINTMENT REMINDER    Patient was reminded to have all medications, supplements and any blood glucose and blood pressure readings available for review with Jeni Salles, Pharm. D, for telephone visit on 04/01/22 at 2:15.    Care Gaps: Hepatitis C Screening - Overdue COVID Booster - Postponed Mammogram - Postponed Zoster Vaccine - Postponed Flu Vaccine - Postponed BP- 120/88 03/17/22 AWV- 01/30/22  Star Rating Drug: None      Medications: Outpatient Encounter Medications as of 04/01/2022  Medication Sig   acetaminophen (TYLENOL) 160 MG/5ML solution Take 320 mg by mouth 2 (two) times daily as needed for mild pain. Reported on 07/21/2015   chlorpheniramine-HYDROcodone (TUSSIONEX PENNKINETIC ER) 10-8 MG/5ML SUER Take 5 mLs by mouth every 12 (twelve) hours as needed for cough.   cholecalciferol (VITAMIN D) 1000 units tablet Take 1,000 Units by mouth daily. OTC   citalopram (CELEXA) 20 MG tablet Take 1 tablet (20 mg total) by mouth daily.   cyanocobalamin (,VITAMIN B-12,) 1000 MCG/ML injection Inject into the muscle.   diphenhydrAMINE (BENADRYL) 12.5 MG/5ML elixir Take 25 mg by mouth 4 (four) times daily as needed for allergies.   fluticasone (FLONASE) 50 MCG/ACT nasal spray Place into the nose.   furosemide (LASIX) 40 MG tablet Take 1 tablet (40 mg total) by mouth daily.   gabapentin (NEURONTIN) 300 MG capsule Take 1 capsule (300 mg total) by mouth 3 (three) times daily.   hydrOXYzine (ATARAX) 25 MG tablet Take 1 tablet (25 mg total) by mouth 3 (three) times daily as needed for itching.   levETIRAcetam (KEPPRA) 500 MG tablet TAKE 1 TABLET BY MOUTH TWICE A DAY   levocetirizine (XYZAL) 5 MG tablet Take by mouth.   Oxcarbazepine (TRILEPTAL) 300 MG tablet TAKE 1 TABLET BY MOUTH 2 TIMES DAILY.   promethazine (PHENERGAN) 25 MG tablet Take 1 tablet (25 mg total) by  mouth every 8 (eight) hours as needed for nausea or vomiting.   propranolol (INDERAL) 20 MG tablet TAKE 1 OR 2 TABLETS THE MORNINGS OF MEDICAL OR DENTAL PROCEDURES   tizanidine (ZANAFLEX) 2 MG capsule Take 1 capsule (2 mg total) by mouth 2 (two) times daily.   tiZANidine (ZANAFLEX) 2 MG tablet TAKE 1 TABLET (2 MG TOTAL) BY MOUTH 2 (TWO) TIMES DAILY. SPOKE WITH OFFICE, TAB OK   No facility-administered encounter medications on file as of 04/01/2022.     [place] Clinical Pharmacist Assistant [phone]

## 2022-04-01 NOTE — Progress Notes (Signed)
Chronic Care Management Pharmacy Note  04/02/2022 Name:  [name] MRN:  [id number] DOB:  [birth year]  Summary: Pt is non compliant with medications per fill history  Recommendations/Changes made from today's visit: -Recommended routine BP monitoring at home given previous history of elevated BP -Recommended drawing levels for anti-epileptics to ensure therapeutic doses  Plan: Scheduled CPE in December BP assessment in 2-3 months Follow up in 6 months  Subjective: [name] is an 56 y.o. year old female who is a primary patient of Laurey Morale, MD.  The CCM team was consulted for assistance with disease management and care coordination needs.    Engaged with patient by telephone for follow up visit in response to provider referral for pharmacy case management and/or care coordination services.   Consent to Services:  The patient was given information about Chronic Care Management services, agreed to services, and gave verbal consent prior to initiation of services.  Please see initial visit note for detailed documentation.   Patient Care Team: Laurey Morale, MD as PCP - General (Family Medicine) [name], MD as Referring Physician (Neurology) [name], [facility] as Pharmacist (Pharmacist)  Recent office visits: 03/17/22 [name], MD: Patient presented for dysuria. Prescribed doxycycline x 7 days.  01/20/22 [name], LPN - Patient presented for Medicare Annual Wellness Exam. No medication changes.   05/27/21 Laurey Morale, MD: Patient was seen for edema. Prescribed Lasix 40 mg every morning.  Recent consult visits: 02/02/22 [name], MD (neuro): Patient presented for MS follow up. Plan to switch to [place].   01/01/22 [name], MD - Patient presented for Multiple Sclerosis. No medication changes.   12/15/21 [name], [name] - Claims  encounter for PR Refraction and other concerns. No  other visit details available.   11/27/21 [name], MD - Patient presented for infusion had post reaction and was transferred to ED.   11/13/21 [name], RN  - Patient presented for Infusion. No medication changes.  Hospital visits: Medication Reconciliation was completed by comparing discharge summary, patient's EMR and Pharmacy list, and upon discussion with patient.   Patient presented to [facility] ED on 12/14/21 due to Seizures and Vomiting. Patient was present for 4 hours.   New?Medications Started at [facility] Discharge:?? -started  Zofran Keflex   Medication Changes at Hospital Discharge: -Changed  none   Medications Discontinued at Hospital Discharge: -Stopped  none   Medications that remain the same after Hospital Discharge:??  -All other medications will remain the same.       Patient presented to [facility] ED on 11/27/21 due to Neurological issues and other concerns.   New?Medications Started at [facility] Discharge:?? -started  meclizine (ANTIVERT) 25    Medication Changes at Hospital Discharge: -Changed  none   Medications Discontinued at Hospital Discharge: -Stopped  none   Medications that remain the same after Hospital Discharge:??  -All other medications will remain the same.         Patient presented to [place]  on 09/28/21 due to vomiting and diarrhea.   New?Medications Started at [facility] Discharge:?? -started  Zofran   Medication Changes at Hospital Discharge: -Changed  none   Medications Discontinued at Hospital Discharge: -Stopped  none   Medications that remain the same after Hospital Discharge:??  -All other medications will remain the same.      Objective:  Lab Results  Component Value Date  CREATININE 0.91 05/27/2021   BUN 12 05/27/2021   GFR 72.22 05/27/2021   GFRNONAA >60 06/02/2018   GFRAA >60 06/02/2018   NA 138 05/27/2021   K 4.0  05/27/2021   CALCIUM 9.2 05/27/2021   CO2 26 05/27/2021   GLUCOSE 87 05/27/2021    Lab Results  Component Value Date/Time   HGBA1C 6.1 05/27/2021 02:57 PM   HGBA1C 6.1 (H) 10/17/2012 01:55 PM   GFR 72.22 05/27/2021 02:57 PM   GFR 74.64 03/09/2018 04:22 PM    Last diabetic Eye exam: No results found for: "HMDIABEYEEXA"  Last diabetic Foot exam: No results found for: "HMDIABFOOTEX"   Lab Results  Component Value Date   CHOL 216 (H) 11/09/2016   HDL 63.00 11/09/2016   LDLCALC 137 (H) 11/09/2016   TRIG 79.0 11/09/2016   CHOLHDL 3 11/09/2016       Latest Ref Rng & Units 05/27/2021    2:57 PM 12/20/2018   12:00 AM 03/09/2018    4:22 PM  Hepatic Function  Total Protein 6.0 - 8.3 g/dL 7.6   6.8   Albumin 3.5 - 5.2 g/dL 4.2   4.0   AST 0 - 37 U/L _0 ALT 0 - 35 U/L _1 Alk Phosphatase 39 - 117 U/L 109  107     83   Total Bilirubin 0.2 - 1.2 mg/dL 0.4   0.3   Bilirubin, Direct 0.0 - 0.3 mg/dL 0.0        This result is from an external source.    Lab Results  Component Value Date/Time   TSH 2.69 05/27/2021 02:57 PM   TSH 2.112 06/02/2018 06:57 PM   TSH 2.18 01/22/2017 03:03 PM   FREET4 0.56 (L) 01/22/2017 03:03 PM   FREET4 0.53 (L) 11/09/2016 11:03 AM       Latest Ref Rng & Units 05/27/2021    2:57 PM 12/20/2018   12:00 AM 06/02/2018    3:09 PM  CBC  WBC 4.0 - 10.5 K/uL 5.8  6.4     6.8   Hemoglobin 12.0 - 15.0 g/dL 12.4  11.9     13.2   Hematocrit 36.0 - 46.0 % 38.5  37     43.1   Platelets 150.0 - 400.0 K/uL 178.0  226     266      This result is from an external source.    Lab Results  Component Value Date/Time   VD25OH 12.63 (L) 11/09/2016 11:03 AM    Clinical ASCVD: No  The ASCVD Risk score (Arnett DK, et al., 2019) failed to calculate for the following reasons:   Cannot find a previous HDL lab   Cannot find a previous total cholesterol lab       01/20/2022   12:16 PM 12/12/2020    7:23 PM 11/22/2020    1:26 PM  Depression screen PHQ  2/9  Decreased Interest 0 0 0  Down, Depressed, Hopeless 0 1 0  PHQ - 2 Score 0 1 0      Social History   Tobacco Use  Smoking Status Never   Passive exposure: Past  Smokeless Tobacco Never   BP Readings from Last 3 Encounters:  03/17/22 120/88  05/27/21 (!) 160/92  07/15/20 120/78   Pulse Readings from Last 3 Encounters:  03/17/22 100  05/27/21 92  07/15/20 (!) 102   Wt Readings from Last 3 Encounters:  03/17/22 257 lb 11.2 oz (116.9 kg)  01/20/22 250 lb (113.4 kg)  12/16/21 240 lb (108.9 kg)   BMI Readings from Last 3 Encounters:  03/17/22 45.65 kg/m  01/20/22 44.29 kg/m  12/16/21 42.51 kg/m    Assessment/Interventions: Review of patient past medical history, allergies, medications, health status, including review of consultants reports, laboratory and other test data, was performed as part of comprehensive evaluation and provision of chronic care management services.   SDOH:  (Social Determinants of Health) assessments and interventions performed: Yes  SDOH Interventions    Flowsheet Row Chronic Care Management from 04/01/2022 in [place] at [place] from 01/20/2022 in [place] at [place] Management from 05/06/2021 in [place] at [place] Management from 12/12/2020 in [place] at [place] Management from 11/22/2020 in [place] at [place] from 11/12/2020 in [place] at [place] Interventions -- Intervention Not Indicated Intervention Not Indicated Assist with [employer], Other (Comment)  [LCSW provided patient with a list of Food Banks, Food Pantries and Soup Kitchens.] Other (Comment)  Engineer, maintenance guide with resources] Information systems manager ([facility] only), [id number] Referral  Housing Interventions -- -- Intervention Not Indicated Other (Comment)  [LCSW will provide patient with housing  resources.] Other (Comment)  [Careguide referral for resources, LCSW for stress] Intervention Not Indicated  Transportation Interventions -- Intervention Not Indicated -- Intervention Not Indicated Intervention Not Indicated Intervention Not Indicated  Financial Strain Interventions Intervention Not Indicated Intervention Not Indicated -- Development worker, community, Other (Comment)  [LCSW provided financial resources.] -- [id number] Referral, Financial Counselor  Physical Activity Interventions -- Other (Comments) -- Intervention Not Indicated -- Intervention Not Indicated  Stress Interventions -- Intervention Not Indicated -- [employer], Provide Counseling, Other (Comment)  [LCSW placed a referral for patient to [place] for ongoing mental health counseling and supportive services.] Other (Comment)  [referral to [facility] Interventions -- -- -- Intervention Not Indicated -- Intervention Not Indicated      SDOH Screenings   Food Insecurity: No Food Insecurity (01/20/2022)  Housing: Low Risk  (05/06/2021)  Transportation Needs: No Transportation Needs (01/20/2022)  Alcohol Screen: Low Risk  (12/12/2020)  Depression (PHQ2-9): Low Risk  (01/20/2022)  Financial Resource Strain: Low Risk  (04/02/2022)  Physical Activity: Insufficiently Active (01/20/2022)  Social Connections: Moderately Isolated (12/12/2020)  Stress: No Stress Concern Present (01/20/2022)  Tobacco Use: Low Risk  (03/17/2022)    CCM Care Plan  Allergies  Allergen Reactions   Banana Swelling    Lips, tongue and face   Sulfa Antibiotics Diarrhea and Nausea And Vomiting    Syncope episode and was incoherent   Sulfacetamide Sodium Other (See Comments)    Syncope episode and was incoherent   Penicillins Diarrhea, Nausea And Vomiting and Other (See Comments)    Has patient had a PCN reaction causing immediate rash, facial/tongue/throat swelling, SOB or lightheadedness  with hypotension: Yes Has patient had a PCN reaction causing severe rash involving mucus membranes or skin necrosis: No Has patient had a PCN reaction that required hospitalization No Has patient had a PCN reaction occurring within the last 10 years: No If all of the above answers are "NO", then may proceed with Cephalosporin use.  Passed out,     Medications Reviewed Today     Reviewed by [name], [facility] (Pharmacist) on 04/01/22 at [phone]  Med List Status: <None>   Medication Order Taking? Sig Documenting Provider Last Dose Status Informant  acetaminophen (TYLENOL) 160 MG/5ML solution [id number] No Take 320 mg by mouth 2 (two) times daily as needed for mild pain. Reported on 07/21/2015 [provider] Taking Active Self  Discontinued 04/01/22 1420 (Patient Preference)   cholecalciferol (VITAMIN D) 1000 units tablet [id number] No Take 1,000 Units by mouth daily. OTC [provider] Taking Active   citalopram (CELEXA) 20 MG tablet [id number] No Take 1 tablet (20 mg total) by mouth daily. Laurey Morale, MD Taking Active   cyanocobalamin (,VITAMIN B-12,) 1000 MCG/ML injection [id number] No Inject into the muscle. [provider] Taking Active   diphenhydrAMINE (BENADRYL) 12.5 MG/5ML elixir [id number] No Take 25 mg by mouth 4 (four) times daily as needed for allergies. [provider] Taking Active Self  fluticasone (FLONASE) 50 MCG/ACT nasal spray [id number] No Place into the nose. [provider] Taking Active   furosemide (LASIX) 40 MG tablet [id number] No Take 1 tablet (40 mg total) by mouth daily. Laurey Morale, MD Taking Active   gabapentin (NEURONTIN) 300 MG capsule [id number] No Take 1 capsule (300 mg total) by mouth 3 (three) times daily. Laurey Morale, MD Taking Active   hydrOXYzine (ATARAX) 25 MG tablet [id number] No Take 1 tablet (25 mg total) by mouth 3 (three) times daily as needed for itching. Laurey Morale, MD Taking Active   levETIRAcetam  (KEPPRA) 500 MG tablet [id number] No TAKE 1 TABLET BY MOUTH TWICE A DAY Laurey Morale, MD Taking Active   levocetirizine (XYZAL) 5 MG tablet [id number] No Take by mouth. [provider] Taking Active   Oxcarbazepine (TRILEPTAL) 300 MG tablet [id number] No TAKE 1 TABLET BY MOUTH 2 TIMES DAILY. Laurey Morale, MD Taking Active   promethazine (PHENERGAN) 25 MG tablet [id number] No Take 1 tablet (25 mg total) by mouth every 8 (eight) hours as needed for nausea or vomiting. Laurey Morale, MD Taking Active   propranolol (INDERAL) 20 MG tablet [id number] No TAKE 1 OR 2 TABLETS THE MORNINGS OF MEDICAL OR DENTAL PROCEDURES Laurey Morale, MD Taking Active   Discontinued 04/01/22 1424 (Completed Course)   tiZANidine (ZANAFLEX) 2 MG tablet [id number] No TAKE 1 TABLET (2 MG TOTAL) BY MOUTH 2 (TWO) TIMES DAILY. SPOKE WITH OFFICE, TAB OK Laurey Morale, MD Taking Active             Patient Active Problem List   Diagnosis Date Noted   COVID-19 virus infection 01/07/2021   Low back pain due to bilateral sciatica 02/22/2018   Essential hypertension 05/30/2015   Right-sided chest pain 06/27/2014   Microcytic anemia 06/27/2014   Multiple sclerosis exacerbation ([place]) 06/27/2014   Obesity (BMI 30-39.9) 06/27/2014   B12 deficiency 06/06/2013   Multiple sclerosis ([place]) 08/12/2010   GERD 12/30/2006   PARESTHESIA 12/30/2006    Immunization History  Administered Date(s) Administered   PPD Test 07/09/2014   Td 05/18/2000   Tdap 03/29/2015   Patient reports she is doing better now and has a lot of support from her sister. She helps with paying for her medications also.  Patient did have a seizure Saturday. She was having them often before but isn't having them as often now. She reports this was her first time in a couple of weeks.  Patient did get sick from chemo for MS in June and July. She had a seizure and had a hospitalization from a bad reaction. She  is doing better and will start a new  medication in December. She has been having chronic UTIs since the medication changes and she has been drinking water some but not enough. She is drinking 1 bottle per day and she is drinking some cranberry juice as well. Discussed how a lot of sugar from juice can actually cause UTIs.  Conditions to be addressed/monitored:  Hypertension, GERD, and MS, vitamin B12 deficiency, anemia  Conditions addressed this visit: Hypertension, MS, GERD   Care Plan : CCM Pharmacy Care Plan  Updates made by [name], [place] since 04/02/2022 12:00 AM     Problem: Problem: Hypertension, GERD, and MS, vitamin B12 deficiency, anemia      Long-Range Goal: Patient-Specific Goal   Start Date: 11/26/2020  Expected End Date: 11/26/2021  Recent Progress: On track  Priority: High  Note:   Current Barriers:  Unable to independently afford treatment regimen Unable to independently monitor therapeutic efficacy  Pharmacist Clinical Goal(s):  Patient will verbalize ability to afford treatment regimen achieve adherence to monitoring guidelines and medication adherence to achieve therapeutic efficacy through collaboration with PharmD and provider.   Interventions: 1:1 collaboration with Laurey Morale, MD regarding development and update of comprehensive plan of care as evidenced by provider attestation and co-signature Inter-disciplinary care team collaboration (see longitudinal plan of care) Comprehensive medication review performed; medication list updated in electronic medical record  Hypertension (BP goal <130/80) -Not ideally controlled -Current treatment: Furosemide 40 mg 1 tablet daily -  Query Appropriate, Effective, Safe, Accessible -Medications previously tried: HCTZ, lisinopril -Current home readings: does not check - owns an arm cuff -Current dietary habits: doesn't use added salt; doesn't look at package labels -Current exercise habits: not able to exercise -Denies hypotensive/hypertensive  symptoms -Educated on BP goals and benefits of medications for prevention of heart attack, stroke and kidney damage; Importance of home blood pressure monitoring; Proper BP monitoring technique; -Counseled to monitor BP at home weekly, document, and provide log at future appointments -Counseled on diet and exercise extensively  Swelling (Goal: minimize swelling) -Controlled -Current treatment  Furosemide 40 mg 1 tablet daily - Appropriate, Query effective, Safe, Accessible -Medications previously tried: none  -Recommended to continue current medication Counseled on importance of daily weights.  Depression/Anxiety (Goal: minimize symptoms) -Controlled -Current treatment: Citalopram 20 mg 1 tablet daily - Appropriate, Effective, Safe, Accessible -Medications previously tried/failed: none -PHQ9: 0 -GAD7: n/a -Educated on Benefits of medication for symptom control Benefits of cognitive-behavioral therapy with or without medication -Recommended to continue current medication Patient does not wish to try another medication at this time.  Anemia (Goal: HgB >11) -Not ideally controlled -Current treatment  Ferrous sulfate 220 mg/5 mL - not taking consistently -Medications previously tried: none  -Recommended to continue current medication Counseled on importance of taking this medication.  Seizures (Goal: prevent seizures) -Uncontrolled -Current treatment  Levetiracetam 100 mg/mL 500 mg twice daily - Appropriate, Query effective, Safe, Accessible Oxcarbazepine 300 mg 1 tablet twice daily - Appropriate, Query effective, Safe, Accessible -Medications previously tried: unknown  -Counseled on importance of taking these medications consistently. Recommended levels to ensure therapeutic doses.  GERD/erosive esophagitis (Goal: minimize symptoms) -Controlled -Current treatment  None -Medications previously tried: famotidine, pantoprazole, sucalfate  -Recommended to continue as  is  Itching/allergic rhinitis (Goal: minimize symptoms) -Uncontrolled -Current treatment  Flonase 50 mcg/act 1 spray as needed - Appropriate, Effective, Safe, Accessible Diphenhydramine 12.5 mg/91m daily as needed - Appropriate, Effective, Safe, Accessible Hydroxyzine 25 mg 1 tablet as needed  for itching - Appropriate, Effective, Safe, Accessible Levocetirizine 5 mg 1 tablet daily - Appropriate, Effective, Safe, Accessible -Medications previously tried: n/a  -Recommended to continue current medication Patient was following with an allergist but does not want to follow up.  Muscle spasms (Goal: minimize symptoms) -Controlled -Current treatment  Tizanidine 2 mg 1 capsule twice daily - Appropriate, Effective, Safe, Accessible Cyclobenzaprine 10 mg as needed for muscle spasms - Appropriate, Effective, Safe, Accessible -Medications previously tried: none  -Counseled on not using both cyclobenzaprine and tizanidine at the same time.   Health Maintenance -Vaccine gaps: shingrix, COVID, prevnar -Current therapy:  Vitamin B12 1000 mcg/mL  Vitamin D 1000 units daily Propranolol 20 mg 1 or 2 tablets the morning of dental procedures  Acetaminophen 160 mg/48m 320 mg twice daily as needed - using on -Educated on Cost vs benefit of each product must be carefully weighed by individual consumer -Patient is satisfied with current therapy and denies issues -Counseled on avoidance of NSAIDS  Patient Goals/Self-Care Activities Patient will:  - take medications as prescribed check blood pressure weekly, document, and provide at future appointments  Follow Up Plan: Telephone follow up appointment with care management team member scheduled for: 6 months       Medication Assistance:  Provided information to apply for Medicare extra help.  Compliance/Adherence/Medication fill history: Care Gaps: COVID booster, prevnar, Hep C screening, pap smear, colonoscopy, mammogram, shingrix, influenza    -She doesn't do vaccines -  BP- 120/88 (03/17/22)   Star-Rating Drugs: None  Patient's preferred pharmacy [facility]:  [id number] [place]: [phone]: [id number] [id number]: [phone]: [phone] pill box? Yes Pt endorses 50% compliance  We discussed: Current pharmacy is preferred with insurance plan and patient is satisfied with pharmacy services Patient decided to: Continue current medication management strategy  Care Plan and Follow Up Patient Decision:  Patient agrees to Care Plan and Follow-up.  Plan: Telephone follow up appointment with care management team member scheduled for:  6 months  MadJeni SallesharmD, BCAChestnut Ridgearmacist [phone]

## 2022-04-12 ENCOUNTER — Other Ambulatory Visit: Payer: Self-pay | Admitting: Family Medicine

## 2022-04-16 DIAGNOSIS — I10 Essential (primary) hypertension: Secondary | ICD-10-CM

## 2022-05-04 ENCOUNTER — Other Ambulatory Visit: Payer: Self-pay | Admitting: Family Medicine

## 2022-05-08 ENCOUNTER — Encounter: Payer: PPO | Admitting: Family Medicine

## 2022-06-26 ENCOUNTER — Other Ambulatory Visit: Payer: Self-pay | Admitting: Family Medicine

## 2022-07-17 ENCOUNTER — Encounter: Payer: Self-pay | Admitting: Family Medicine

## 2022-07-17 ENCOUNTER — Ambulatory Visit (INDEPENDENT_AMBULATORY_CARE_PROVIDER_SITE_OTHER): Payer: PPO | Admitting: Family Medicine

## 2022-07-17 VITALS — BP 126/80 | HR 82 | Temp 98.3°F | Wt 251.0 lb

## 2022-07-17 DIAGNOSIS — R059 Cough, unspecified: Secondary | ICD-10-CM | POA: Diagnosis not present

## 2022-07-17 DIAGNOSIS — J40 Bronchitis, not specified as acute or chronic: Secondary | ICD-10-CM

## 2022-07-17 DIAGNOSIS — J029 Acute pharyngitis, unspecified: Secondary | ICD-10-CM | POA: Diagnosis not present

## 2022-07-17 LAB — POC COVID19 BINAXNOW: SARS Coronavirus 2 Ag: NEGATIVE

## 2022-07-17 MED ORDER — AZITHROMYCIN 250 MG PO TABS: ORAL_TABLET | ORAL | 0 refills | Status: DC

## 2022-07-17 MED ORDER — HYDROCODONE BIT-HOMATROP MBR 5-1.5 MG/5ML PO SOLN: 5.0000 mL | ORAL | 0 refills | Status: DC | PRN

## 2022-07-17 NOTE — Progress Notes (Signed)
   Subjective:    Patient ID: [name], female    DOB: 1965/11/03, 57 y.o.   MRN: [id number]  HPI Here for 3 days of stuffy head, PND, chest congestion and coughing up green sputum. No fever or SOB.    Review of Systems  Constitutional: Negative.   HENT:  Positive for congestion and postnasal drip. Negative for ear pain, sinus pressure and sore throat.   Eyes: Negative.   Respiratory:  Positive for cough and chest tightness. Negative for shortness of breath and wheezing.        Objective:   Physical Exam Constitutional:      Appearance: Normal appearance.     Comments: Her voice is hoarse   HENT:     Right Ear: Tympanic membrane, ear canal and external ear normal.     Left Ear: Tympanic membrane, ear canal and external ear normal.     Nose: Nose normal.     Mouth/Throat:     Pharynx: Oropharynx is clear.  Eyes:     Conjunctiva/sclera: Conjunctivae normal.  Pulmonary:     Effort: Pulmonary effort is normal.     Breath sounds: Rhonchi present. No wheezing or rales.  Lymphadenopathy:     Cervical: No cervical adenopathy.  Neurological:     Mental Status: She is alert.           Assessment & Plan:  Bronchitis, treat with a Zpack. Alysia Penna, MD

## 2022-08-03 DIAGNOSIS — G35 Multiple sclerosis: Secondary | ICD-10-CM | POA: Diagnosis not present

## 2022-08-03 DIAGNOSIS — R269 Unspecified abnormalities of gait and mobility: Secondary | ICD-10-CM | POA: Diagnosis not present

## 2022-08-05 ENCOUNTER — Encounter: Payer: Self-pay | Admitting: Family Medicine

## 2022-08-05 ENCOUNTER — Ambulatory Visit (INDEPENDENT_AMBULATORY_CARE_PROVIDER_SITE_OTHER): Payer: PPO | Admitting: Family Medicine

## 2022-08-05 ENCOUNTER — Telehealth: Payer: Self-pay | Admitting: Family Medicine

## 2022-08-05 VITALS — BP 120/84 | HR 79 | Temp 98.1°F | Wt 251.0 lb

## 2022-08-05 DIAGNOSIS — R1011 Right upper quadrant pain: Secondary | ICD-10-CM | POA: Diagnosis not present

## 2022-08-05 LAB — CBC WITH DIFFERENTIAL/PLATELET
Basophils Absolute: 0 10*3/uL (ref 0.0–0.1)
Basophils Relative: 0.8 % (ref 0.0–3.0)
Eosinophils Absolute: 0.2 10*3/uL (ref 0.0–0.7)
Eosinophils Relative: 3.9 % (ref 0.0–5.0)
HCT: 38.4 % (ref 36.0–46.0)
Hemoglobin: 12.6 g/dL (ref 12.0–15.0)
Lymphocytes Relative: 21.2 % (ref 12.0–46.0)
Lymphs Abs: 1.2 10*3/uL (ref 0.7–4.0)
MCHC: 32.8 g/dL (ref 30.0–36.0)
MCV: 80.7 fl (ref 78.0–100.0)
Monocytes Absolute: 0.5 10*3/uL (ref 0.1–1.0)
Monocytes Relative: 8.1 % (ref 3.0–12.0)
Neutro Abs: 3.8 10*3/uL (ref 1.4–7.7)
Neutrophils Relative %: 66 % (ref 43.0–77.0)
Platelets: 241 10*3/uL (ref 150.0–400.0)
RBC: 4.76 Mil/uL (ref 3.87–5.11)
RDW: 15.2 % (ref 11.5–15.5)
WBC: 5.7 10*3/uL (ref 4.0–10.5)

## 2022-08-05 LAB — BASIC METABOLIC PANEL
BUN: 11 mg/dL (ref 6–23)
CO2: 28 mEq/L (ref 19–32)
Calcium: 9.7 mg/dL (ref 8.4–10.5)
Chloride: 103 mEq/L (ref 96–112)
Creatinine, Ser: 0.95 mg/dL (ref 0.40–1.20)
GFR: 68.02 mL/min (ref >60.00)
Glucose, Bld: 80 mg/dL (ref 70–99)
Potassium: 3.8 mEq/L (ref 3.5–5.1)
Sodium: 139 mEq/L (ref 135–145)

## 2022-08-05 LAB — HEPATIC FUNCTION PANEL
ALT: 11 U/L (ref 0–35)
AST: 15 U/L (ref 0–37)
Albumin: 4.3 g/dL (ref 3.5–5.2)
Alkaline Phosphatase: 112 U/L (ref 39–117)
Bilirubin, Direct: 0.1 mg/dL (ref 0.0–0.3)
Total Bilirubin: 0.6 mg/dL (ref 0.2–1.2)
Total Protein: 7.8 g/dL (ref 6.0–8.3)

## 2022-08-05 LAB — POC URINALSYSI DIPSTICK (AUTOMATED)
Blood, UA: NEGATIVE
Glucose, UA: NEGATIVE
Ketones, UA: NEGATIVE
Leukocytes, UA: NEGATIVE
Nitrite, UA: NEGATIVE
Protein, UA: POSITIVE — AB
Spec Grav, UA: 1.01 (ref 1.010–1.025)
Urobilinogen, UA: 0.2 E.U./dL
pH, UA: 7 (ref 5.0–8.0)

## 2022-08-05 NOTE — Telephone Encounter (Signed)
Pt is calling and [facility] can not do  abd ultrasound until April 2024 and per pt she can not wait that long

## 2022-08-05 NOTE — Telephone Encounter (Signed)
Can you assist with this?   Thanks

## 2022-08-05 NOTE — Addendum Note (Signed)
Addended by: Wyvonne Lenz on: 08/05/2022 04:19 PM   Modules accepted: Orders

## 2022-08-05 NOTE — Progress Notes (Signed)
   Subjective:    Patient ID: [name], female    DOB: 02-Feb-1966, 57 y.o.   MRN: [id number]  HPI Here for RUQ abdominal pain that started 8 weeks ago. It waxes and wanes but it is always present. It is sharp but not severe. No fever or nausea. Eating food has no effect on the pain. Her BM's are regular. No urinary symptoms. Interestingly her 9 year old sister recently had surgery to remove her gall bladder.    Review of Systems  Constitutional: Negative.   Respiratory: Negative.    Cardiovascular: Negative.   Gastrointestinal:  Positive for abdominal pain. Negative for abdominal distention, blood in stool, constipation, diarrhea, nausea and vomiting.  Genitourinary: Negative.        Objective:   Physical Exam Constitutional:      Appearance: She is obese. She is not ill-appearing.  Cardiovascular:     Rate and Rhythm: Normal rate and regular rhythm.     Pulses: Normal pulses.     Heart sounds: Normal heart sounds.  Pulmonary:     Effort: Pulmonary effort is normal.     Breath sounds: Normal breath sounds.  Abdominal:     General: Bowel sounds are normal. There is no distension.     Palpations: Abdomen is soft. There is no mass.     Tenderness: There is no right CVA tenderness, left CVA tenderness or guarding.     Hernia: No hernia is present.     Comments: She is moderately tender in the RUQ   Neurological:     Mental Status: She is alert.           Assessment & Plan:  RUQ pain, suspect gall bladder disease. Get labs today including CBC and liver panel. Set up an abdominal US.  Alysia Penna, MD

## 2022-08-10 NOTE — Telephone Encounter (Signed)
Noted  

## 2022-08-10 NOTE — Telephone Encounter (Signed)
Patient calling to check on progress of this request. Is uncomfortable with waiting a long time as she is afraid something may be very wrong.

## 2022-08-10 NOTE — Telephone Encounter (Signed)
Can someone please assist with this

## 2022-08-13 ENCOUNTER — Ambulatory Visit (HOSPITAL_BASED_OUTPATIENT_CLINIC_OR_DEPARTMENT_OTHER)
Admission: RE | Admit: 2022-08-13 | Discharge: 2022-08-13 | Disposition: A | Discharge status: Home / Self Care | Payer: PPO | Source: Ambulatory Visit | Attending: Family Medicine | Admitting: Family Medicine

## 2022-08-13 DIAGNOSIS — R1011 Right upper quadrant pain: Secondary | ICD-10-CM | POA: Diagnosis not present

## 2022-08-13 DIAGNOSIS — K760 Fatty (change of) liver, not elsewhere classified: Secondary | ICD-10-CM | POA: Diagnosis not present

## 2022-08-18 ENCOUNTER — Other Ambulatory Visit: Payer: Self-pay | Admitting: Nurse Practitioner

## 2022-08-18 ENCOUNTER — Ambulatory Visit
Admission: RE | Admit: 2022-08-18 | Discharge: 2022-08-18 | Disposition: A | Discharge status: Home / Self Care | Payer: PPO | Source: Ambulatory Visit | Attending: Nurse Practitioner | Admitting: Nurse Practitioner

## 2022-08-18 DIAGNOSIS — J189 Pneumonia, unspecified organism: Secondary | ICD-10-CM

## 2022-08-24 ENCOUNTER — Other Ambulatory Visit: Payer: Self-pay | Admitting: Family Medicine

## 2022-08-24 NOTE — Telephone Encounter (Signed)
Prescription Request  08/24/2022  LOV: 08/05/2022  What is the name of the medication or equipment? tiZANidine (ZANAFLEX) 2 MG tablet        Have you contacted your pharmacy to request a refill? Yes   Which pharmacy would you like this sent to?   [id number] [place], [place] - [address] Phone: [phone]  Fax: [phone]      Patient notified that their request is being sent to the clinical staff for review and that they should receive a response within 2 business days.   Please advise at Mobile [phone] (mobile)

## 2022-08-25 ENCOUNTER — Other Ambulatory Visit: Payer: Self-pay | Admitting: Family Medicine

## 2022-08-26 MED ORDER — TIZANIDINE HCL 2 MG PO TABS: ORAL_TABLET | ORAL | 3 refills | Status: DC

## 2022-08-26 NOTE — Telephone Encounter (Signed)
Please advise if okay to refill. 

## 2022-09-21 ENCOUNTER — Other Ambulatory Visit: Payer: Self-pay | Admitting: Family Medicine

## 2022-09-24 ENCOUNTER — Other Ambulatory Visit: Payer: Self-pay | Admitting: Family Medicine

## 2022-09-25 ENCOUNTER — Telehealth: Payer: Self-pay

## 2022-09-25 NOTE — Progress Notes (Unsigned)
Care Management & Coordination Services Pharmacy Note  09/25/2022 Name:  [name] MRN:  [id number] DOB:  1965/11/06  Summary: BP at goal <140/90 Reports mostly well-controlled pain with current regimen Still experiencing side pain on occasion, plans to call office at later time to schedule f/u with PCP  Recommendations/Changes made from today's visit: -Counseled to check BP once weekly at home and counseled on side effects of low/high BP -Counseled on sedation with medications   Follow up plan: Pharmacist visit in 6-7 months   Subjective: ZSOFIA Francis is an 57 y.o. year old female who is a primary patient of Nelwyn Salisbury, MD.  The care coordination team was consulted for assistance with disease management and care coordination needs.    Engaged with patient by telephone for follow up visit.  Recent office visits: 08/05/22 Gershon Crane, MD - For RUQ Pain - Suspected gall bladder disease, labs ordered an abdominal US, no med changes. [place] normal on 08/13/22  07/17/22 Gershon Crane, MD - For bronchitis. START Zpak  Recent consult visits: 08/02/21 Allena Earing, MD (Neuro) - For MS. Work towards starting [facility] visits: None in previous 6 months   Objective:  Lab Results  Component Value Date   CREATININE 0.95 08/05/2022   BUN 11 08/05/2022   GFR 68.02 08/05/2022   GFRNONAA >60 06/02/2018   GFRAA >60 06/02/2018   NA 139 08/05/2022   K 3.8 08/05/2022   CALCIUM 9.7 08/05/2022   CO2 28 08/05/2022   GLUCOSE 80 08/05/2022    Lab Results  Component Value Date/Time   HGBA1C 6.1 05/27/2021 02:57 PM   HGBA1C 6.1 (H) 10/17/2012 01:55 PM   GFR 68.02 08/05/2022 02:29 PM   GFR 72.22 05/27/2021 02:57 PM    Last diabetic Eye exam: No results found for: "HMDIABEYEEXA"  Last diabetic Foot exam: No results found for: "HMDIABFOOTEX"   Lab Results  Component Value Date   CHOL 216 (H) 11/09/2016   HDL 63.00 11/09/2016   LDLCALC 137 (H) 11/09/2016    TRIG 79.0 11/09/2016   CHOLHDL 3 11/09/2016       Latest Ref Rng & Units 08/05/2022    2:29 PM 05/27/2021    2:57 PM 12/20/2018   12:00 AM  Hepatic Function  Total Protein 6.0 - 8.3 g/dL 7.8  7.6    Albumin 3.5 - 5.2 g/dL 4.3  4.2    AST 0 - 37 U/L 15  15  15       ALT 0 - 35 U/L 11  12  10       Alk Phosphatase 39 - 117 U/L 112  109  107      Total Bilirubin 0.2 - 1.2 mg/dL 0.6  0.4    Bilirubin, Direct 0.0 - 0.3 mg/dL 0.1  0.0       This result is from an external source.    Lab Results  Component Value Date/Time   TSH 2.69 05/27/2021 02:57 PM   TSH 2.112 06/02/2018 06:57 PM   TSH 2.18 01/22/2017 03:03 PM   FREET4 0.56 (L) 01/22/2017 03:03 PM   FREET4 0.53 (L) 11/09/2016 11:03 AM       Latest Ref Rng & Units 08/05/2022    2:29 PM 05/27/2021    2:57 PM 12/20/2018   12:00 AM  CBC  WBC 4.0 - 10.5 K/uL 5.7  5.8  6.4      Hemoglobin 12.0 - 15.0 g/dL 21.3  08.6  11.9  Hematocrit 36.0 - 46.0 % 38.4  38.5  37      Platelets 150.0 - 400.0 K/uL 241.0  178.0  226         This result is from an external source.    Lab Results  Component Value Date/Time   VD25OH 12.63 (L) 11/09/2016 11:03 AM   VITAMINB12 250 03/04/2021 02:01 PM   VITAMINB12 712 08/15/2019 11:53 AM    Clinical ASCVD: Yes  The ASCVD Risk score (Arnett DK, et al., 2019) failed to calculate for the following reasons:   The patient has a prior MI or stroke diagnosis       08/05/2022    4:24 PM 01/20/2022   12:16 PM 12/12/2020    7:23 PM  Depression screen PHQ 2/9  Decreased Interest 1 0 0  Down, Depressed, Hopeless 1 0 1  PHQ - 2 Score 2 0 1  Altered sleeping 0    Tired, decreased energy 2    Change in appetite 0    Feeling bad or failure about yourself  1    Trouble concentrating 1    Moving slowly or fidgety/restless 1    Suicidal thoughts 0    PHQ-9 Score 7    Difficult doing work/chores Somewhat difficult       Social History   Tobacco Use  Smoking Status Never   Passive exposure: Past   Smokeless Tobacco Never   BP Readings from Last 3 Encounters:  08/05/22 120/84  07/17/22 126/80  03/17/22 120/88   Pulse Readings from Last 3 Encounters:  08/05/22 79  07/17/22 82  03/17/22 100   Wt Readings from Last 3 Encounters:  08/05/22 251 lb (113.9 kg)  07/17/22 251 lb (113.9 kg)  03/17/22 257 lb 11.2 oz (116.9 kg)   BMI Readings from Last 3 Encounters:  08/05/22 44.46 kg/m  07/17/22 44.46 kg/m  03/17/22 45.65 kg/m    Allergies  Allergen Reactions   Banana Swelling    Lips, tongue and face   Sulfa Antibiotics Diarrhea and Nausea And Vomiting    Syncope episode and was incoherent   Sulfacetamide Sodium Other (See Comments)    Syncope episode and was incoherent   Penicillins Diarrhea, Nausea And Vomiting and Other (See Comments)    Has patient had a PCN reaction causing immediate rash, facial/tongue/throat swelling, SOB or lightheadedness with hypotension: Yes Has patient had a PCN reaction causing severe rash involving mucus membranes or skin necrosis: No Has patient had a PCN reaction that required hospitalization No Has patient had a PCN reaction occurring within the last 10 years: No If all of the above answers are "NO", then may proceed with Cephalosporin use.  Passed out,     Medications Reviewed Today     Reviewed by Nelwyn Salisbury, MD (Physician) on 08/05/22 at 1410  Med List Status: <None>   Medication Order Taking? Sig Documenting Provider Last Dose Status Informant  acetaminophen (TYLENOL) 160 MG/5ML solution [id number] Yes Take 320 mg by mouth 2 (two) times daily as needed for mild pain. Reported on 07/21/2015 [provider] Taking Active Self  cholecalciferol (VITAMIN D) 1000 units tablet [id number] Yes Take 1,000 Units by mouth daily. OTC [provider] Taking Active   citalopram (CELEXA) 20 MG tablet [id number] Yes TAKE 1 TABLET BY MOUTH EVERY DAY Nelwyn Salisbury, MD Taking Active   cyanocobalamin (,VITAMIN B-12,) 1000 MCG/ML  injection [id number] Yes Inject into the muscle. [provider] Taking Active   diphenhydrAMINE (BENADRYL) 12.5  MG/5ML elixir [id number] Yes Take 25 mg by mouth 4 (four) times daily as needed for allergies. [provider] Taking Active Self  fluticasone (FLONASE) 50 MCG/ACT nasal spray [id number] Yes Place into the nose. [provider] Taking Active   furosemide (LASIX) 40 MG tablet [id number] Yes Take 1 tablet (40 mg total) by mouth daily. Nelwyn Salisbury, MD Taking Active   gabapentin (NEURONTIN) 300 MG capsule [id number] Yes TAKE 1 CAPSULE BY MOUTH THREE TIMES A DAY Nelwyn Salisbury, MD Taking Active   HYDROcodone bit-homatropine (HYCODAN) 5-1.5 MG/5ML syrup [id number] Yes Take 5 mLs by mouth every 4 (four) hours as needed. Nelwyn Salisbury, MD Taking Active   hydrOXYzine (ATARAX) 25 MG tablet [id number] Yes Take 1 tablet (25 mg total) by mouth 3 (three) times daily as needed for itching. Nelwyn Salisbury, MD Taking Active   levETIRAcetam (KEPPRA) 500 MG tablet [id number] Yes TAKE 1 TABLET BY MOUTH TWICE A DAY Nelwyn Salisbury, MD Taking Active   levocetirizine (XYZAL) 5 MG tablet [id number] Yes Take by mouth. [provider] Taking Active   Oxcarbazepine (TRILEPTAL) 300 MG tablet [id number] Yes TAKE 1 TABLET BY MOUTH TWICE A DAY Nelwyn Salisbury, MD Taking Active   promethazine (PHENERGAN) 25 MG tablet [id number] Yes Take 1 tablet (25 mg total) by mouth every 8 (eight) hours as needed for nausea or vomiting. Nelwyn Salisbury, MD Taking Active   propranolol (INDERAL) 20 MG tablet [id number] Yes TAKE 1 OR 2 TABLETS THE MORNINGS OF MEDICAL OR DENTAL PROCEDURES Nelwyn Salisbury, MD Taking Active   tiZANidine (ZANAFLEX) 2 MG tablet [id number] Yes TAKE 1 TABLET (2 MG TOTAL) BY MOUTH 2 (TWO) TIMES DAILY. SPOKE WITH OFFICE, TAB OK Nelwyn Salisbury, MD Taking Active             SDOH:  (Social Determinants of Health) assessments and interventions performed: Yes SDOH Interventions     Flowsheet Row Chronic Care Management from 04/01/2022 in [facility] [place] Clinical Support from 01/20/2022 in [facility] [place] Chronic Care Management from 05/06/2021 in [facility] [place] Chronic Care Management from 12/12/2020 in [facility] [place] Chronic Care Management from 11/22/2020 in [facility] [place] Clinical Support from 11/12/2020 in [facility] [place]  SDOH Interventions        Food Insecurity Interventions -- Intervention Not Indicated Intervention Not Indicated Assist with [employer] Application, Other (Comment)  [LCSW provided patient with a list of Food Banks, Food Pantries and Soup Kitchens.] Other (Comment)  Conservator, museum/gallery guide with resources] Stage manager ([facility] only), [id number] Referral  Housing Interventions -- -- Intervention Not Indicated Other (Comment)  [LCSW will provide patient with housing resources.] Other (Comment)  [Careguide referral for resources, LCSW for stress] Intervention Not Indicated  Transportation Interventions -- Intervention Not Indicated -- Intervention Not Indicated Intervention Not Indicated Intervention Not Indicated  Financial Strain Interventions Intervention Not Indicated Intervention Not Indicated -- Artist, Other (Comment)  [LCSW provided financial resources.] -- [id number] Referral, Financial Counselor  Physical Activity Interventions -- Other (Comments) -- Intervention Not Indicated -- Intervention Not Indicated  Stress Interventions -- Intervention Not Indicated -- [employer], Provide Counseling, Other (Comment)  [LCSW placed a referral for patient to [facility] for ongoing mental health counseling and supportive services.] Other (Comment)  [referral to LCSW] [employer]  Social Connections Interventions -- -- -- Intervention Not  Indicated  -- Intervention Not Indicated       Medication Assistance: None required.  Patient affirms current coverage meets needs.  Medication Access: Within the past 30 days, how often has patient missed a dose of medication? None Is a pillbox or other method used to improve adherence? Yes  Factors that may affect medication adherence? no barriers identified Are meds synced by current pharmacy? No  Are meds delivered by current pharmacy? No  Does patient experience delays in picking up medications due to transportation concerns? No   [facility] Reviewed: Is patient disadvantaged to use [facility]?: Yes  Current Rx insurance plan: HTA Name and location of [facility] pharmacy[facility]  [phone] [place], [place] - [address] [place] [address] Phone: [phone] Fax: [phone]  [id number] [place] [place] - [address] AT [facility] [address] [place] [address] Phone: [phone] Fax: [phone]  [facility] services reviewed with patient today?: No  Patient requests to transfer care to [facility]?: No  Reason patient declined to change pharmacies: Not mentioned at this visit  Compliance/Adherence/Medication fill history: Care Gaps: Hep C Screen - Overdue Zoster Vaccine - Overdue COVID Booster - Overdue Mammogram - Overdue AWV 01/20/22  Star-Rating Drugs: None   Assessment/Plan Hypertension (BP goal <130/80) -Controlled -Current treatment: Lasix 40mg  1 qd Appropriate, Effective, Safe, Accessible -Medications previously tried: Lisinopril, Losartan/HCTZ  -Current home readings: does have a machine at home -Current dietary habits: mindful of salt intake -Current exercise habits: walks dogs -Denies hypotensive/hypertensive symptoms -Educated on BP goals and benefits of medications for prevention of heart attack, stroke and kidney damage; Daily salt  intake goal < 2300 mg; Importance of home blood pressure monitoring; Proper BP monitoring technique; Symptoms of hypotension and importance of maintaining adequate hydration; -Counseled to monitor BP at home once weekly, document, and provide log at future appointments -Recommended to continue current medication  Chronic Pain/MS/Sciatica (Goal: Pain relief that still allows for ADLs) -Controlled -Current treatment  Tizanidine 2mg  1 BID Appropriate, Effective, Safe, Accessible Gabapentin 300mg  1 TID Appropriate, Effective, Safe, Accessible Tylenol prn Appropriate, Effective, Safe, Accessible -Medications previously tried: None  -Recommended to continue current medication  Sherrill Raring Clinical Pharmacist [phone]

## 2022-09-25 NOTE — Progress Notes (Signed)
Patient ID: [name], female   DOB: Sep 09, 1965, 57 y.o.   MRN: [id number]  Care Management & Coordination Services Pharmacy Team  Reason for Encounter: Appointment Reminder  Contacted patient to confirm telephone appointment with Milas Kocher, PharmD on 09/28/22 at 1. Unsuccessful outreach. Left voicemail for patient to return call.    Star Rating Drugs:  None    Care Gaps: Hep C Screen - Overdue Zoster Vaccine - Overdue COVID Booster - Overdue Mammogram - Overdue AWV 01/20/22   Pamala Duffel CMA Clinical Pharmacist Assistant [phone]

## 2022-09-28 ENCOUNTER — Ambulatory Visit: Payer: PPO

## 2022-10-11 ENCOUNTER — Other Ambulatory Visit: Payer: Self-pay | Admitting: Family Medicine

## 2022-11-10 ENCOUNTER — Ambulatory Visit (INDEPENDENT_AMBULATORY_CARE_PROVIDER_SITE_OTHER): Payer: PPO | Admitting: Family Medicine

## 2022-11-10 DIAGNOSIS — Z0000 Encounter for general adult medical examination without abnormal findings: Secondary | ICD-10-CM

## 2022-11-10 NOTE — Patient Instructions (Signed)
I really enjoyed getting to talk with you today! I am available on Tuesdays and Thursdays for virtual visits if you have any questions or concerns, or if I can be of any further assistance.   CHECKLIST FROM ANNUAL WELLNESS VISIT:  -Follow up (please call to schedule if not scheduled after visit):   -yearly for annual wellness visit with primary care office  Here is a list of your preventive care/health maintenance measures and the plan for each if any are due:  PLAN For any measures below that may be due:  -please call the mammogram center to schedule your mammogram -can get hepatitis C screening when you see Dr. Clent Ridges if you wish -if you change your mind about the shingles or covid vaccines, you can get them at the pharmacy  Health Maintenance  Topic Date Due   Hepatitis C Screening  Never done   MAMMOGRAM  08/07/2020   COVID-19 Vaccine (3 - Pfizer risk series) 11/26/2022 (Originally 11/17/2019)   Zoster Vaccines- Shingrix (1 of 2) 02/10/2023 (Originally 04/14/1987)   INFLUENZA VACCINE  12/17/2022   Medicare Annual Wellness (AWV)  11/10/2023   PAP SMEAR-Modifier  03/17/2024   DTaP/Tdap/Td (3 - Td or Tdap) 03/28/2025   Colonoscopy  11/29/2030   HIV Screening  Completed   HPV VACCINES  Aged Out    -See a dentist at least yearly  -Get your eyes checked and then per your eye specialist's recommendations  -Other issues addressed today:   -I have included below further information regarding a healthy whole foods based diet, physical activity guidelines for adults, stress management and opportunities for social connections. I hope you find this information useful.   -----------------------------------------------------------------------------------------------------------------------------------------------------------------------------------------------------------------------------------------------------------  NUTRITION: -eat real food: lots of colorful vegetables (half the plate)  and fruits -5-7 servings of vegetables and fruits per day (fresh or steamed is best), exp. 2 servings of vegetables with lunch and dinner and 2 servings of fruit per day. Berries and greens such as kale and collards are great choices.  -consume on a regular basis: whole grains (make sure first ingredient on label contains the word "whole"), fresh fruits, fish, nuts, seeds, healthy oils (such as olive oil, avocado oil, grape seed oil) -may eat small amounts of dairy and lean meat on occasion, but avoid processed meats such as ham, bacon, lunch meat, etc. -drink water -try to avoid fast food and pre-packaged foods, processed meat -most experts advise limiting sodium to < 2300mg  per day, should limit further is any chronic conditions such as high blood pressure, heart disease, diabetes, etc. The American Heart Association advised that < 1500mg  is is ideal -try to avoid foods that contain any ingredients with names you do not recognize  -try to avoid sugar/sweets (except for the natural sugar that occurs in fresh fruit) -try to avoid sweet drinks -try to avoid white rice, white bread, pasta (unless whole grain), white or yellow potatoes  EXERCISE GUIDELINES FOR ADULTS: -if you wish to increase your physical activity, do so gradually and with the approval of your doctor -STOP and seek medical care immediately if you have any chest pain, chest discomfort or trouble breathing when starting or increasing exercise  -move and stretch your body, legs, feet and arms when sitting for long periods -Physical activity guidelines for optimal health in adults: -least 150 minutes per week of aerobic exercise (can talk, but not sing) once approved by your doctor, 20-30 minutes of sustained activity or two 10 minute episodes of sustained activity every day.  -  resistance training at least 2 days per week if approved by your doctor -balance exercises 3+ days per week:   Stand somewhere where you have something sturdy  to hold onto if you lose balance.    1) lift up on toes, start with 5x per day and work up to 20x   2) stand and lift on leg straight out to the side so that foot is a few inches of the floor, start with 5x each side and work up to 20x each side   3) stand on one foot, start with 5 seconds each side and work up to 20 seconds on each side  If you need ideas or help with getting more active:  -Silver sneakers https://tools.silversneakers.com  -Walk with a Doc: http://www.duncan-williams.com/  -try to include resistance (weight lifting/strength building) and balance exercises twice per week: or the following link for ideas: http://castillo-powell.com/  BuyDucts.dk  STRESS MANAGEMENT: -can try meditating, or just sitting quietly with deep breathing while intentionally relaxing all parts of your body for 5 minutes daily -if you need further help with stress, anxiety or depression please follow up with your primary doctor or contact the wonderful folks at [facility]: [phone]  SOCIAL CONNECTIONS: -options in [place] if you wish to engage in more social and exercise related activities:  -Silver sneakers https://tools.silversneakers.com  -Walk with a Doc: http://www.duncan-williams.com/  -Check out the [facility] Active Adults 50+ section on the [place] [employer] (hiking clubs, book clubs, cards and games, chess, exercise classes, aquatic classes and much more) - see the website for details: https://www.[place].gov/departments/parks-recreation/active-adults50  -YouTube has lots of exercise videos for different ages and abilities as well  -[facility] (a variety of indoor and outdoor inperson activities for adults). [phone]. [address]  -Virtual Online Classes (a variety of topics): see seniorplanet.org or call (978) 521-6962  -consider  volunteering at a school, hospice center, church, senior center or elsewhere         ADVANCED HEALTHCARE DIRECTIVES:  Everyone should have advanced health care directives in place. This is so that you get the care you want, should you ever be in a situation where you are unable to make your own medical decisions.   From the [place] Advanced Directive Website: "Advance Health Care Directives are legal documents in which you give written instructions about your health care if, in the future, you cannot speak for yourself.   A health care power of attorney allows you to name a person you trust to make your health care decisions if you cannot make them yourself. A declaration of a desire for a natural death (or living will) is document, which states that you desire not to have your life prolonged by extraordinary measures if you have a terminal or incurable illness or if you are in a vegetative state. An advance instruction for mental health treatment makes a declaration of instructions, information and preferences regarding your mental health treatment. It also states that you are aware that the advance instruction authorizes a mental health treatment provider to act according to your wishes. It may also outline your consent or refusal of mental health treatment. A declaration of an anatomical gift allows anyone over the age of 66 to make a gift by will, organ donor card or other document."   Please see the following website or an elder law attorney for forms, FAQs and for completion of advanced directives: [place] TEFL teacher Health Care Directives Advance Health Care Directives (  http://guz[place].com/sosnc.gov)  Or copy and paste the following to your web browser: PoshChat.fiHttps://wwwsosnc.gov/ahcdr

## 2022-11-10 NOTE — Progress Notes (Signed)
PATIENT CHECK-IN and HEALTH RISK ASSESSMENT QUESTIONNAIRE:  -completed by phone/video for upcoming Medicare Preventive Visit  Pre-Visit Check-in: 1)Vitals (height, wt, BP, etc) - record in vitals section for visit on day of visit 2)Review and Update Medications, Allergies PMH, Surgeries, Social history in Epic 3)Hospitalizations in the last year with date/reason? No   4)Review and Update Care Team (patient's specialists) in Epic 5) Complete PHQ9 in Epic  6) Complete Fall Screening in Epic 7)Review all Health Maintenance Due and order under PCP if not done.  8)Medicare Wellness Questionnaire: Answer theses question about your habits: Do you drink alcohol? No  If yes, how many drinks do you have a day?n/a Have you ever smoked? No  Quit date if applicable? N/a  How many packs a day do/did you smoke? N/a Do you use smokeless tobacco? No  Do you use an illicit drugs?no  Do you exercises? Tries to IF so, what type and how many days/minutes per week?  will start going back to PT in the near future, she tries to walk her dog and move as able - somewhat limited with MS. Has silver sneakers.  Are you sexually active? No Number of partners? n/a Typical breakfast: some fruit Typical lunch  not a big eater, some fruit, or apple fritter  Typical dinner eats salad Typical snacks:  Beverages: coke cola and water   Answer theses question about you: Can you perform most household chores? No   Do you find it hard to follow a conversation in a noisy room? No  Do you often ask people to speak up or repeat themselves? No, sometimes every now and then notices is better, reports can hear fine but sometimes better than other time Do you feel that you have a problem with memory?  Do you balance your checkbook and or bank acounts? No  Do you feel safe at home? Yes  Last dentist visit? 2020 Do you need assistance with any of the following: Please note if so   Driving? yes  Feeding yourself? No  Getting  from bed to chair? No   Getting to the toilet? Little   Bathing or showering? Has shower chair  Dressing yourself? Needs some asistance  Managing money? yes  Climbing a flight of stairs. crawls  Preparing meals?  Do you have Advanced Directives in place (Living Will, Healthcare Power or Attorney)?  No    Last eye Exam and location? Last sept Dr Louanna Raw - sees eye specialist once yearly   Do you currently use prescribed or non-prescribed narcotic or opioid pain medications?   Do you have a history or close family history of breast, ovarian, tubal or peritoneal cancer or a family member with BRCA (breast cancer susceptibility 1 and 2) gene mutations? No   Nurse/Assistant Credentials/time stamp: Tora Perches CMA 4:25 pm   ----------------------------------------------------------------------------------------------------------------------------------------------------------------------------------------------------------------------   MEDICARE ANNUAL PREVENTIVE VISIT WITH PROVIDER: (Welcome to Medicare, initial annual wellness or annual wellness exam)  Virtual Visit via Phone Note  I connected with [name] on 11/10/22 by phone and verified that I am speaking with the correct person using two identifiers.  Location patient: home Location provider:work or home office Persons participating in the virtual visit: patient, provider  Concerns and/or follow up today: doing ok. Having to stay inside because of the heat - she likes to walk.    See HM section in Epic for other details of completed HM.    ROS: negative for report of fevers, unintentional weight loss, vision changes,  vision loss, hearing loss or change, chest pain, sob, hemoptysis, melena, hematochezia, hematuria, falls, bleeding or bruising, thoughts of suicide or self harm, memory loss  Patient-completed extensive health risk assessment - reviewed and discussed with the patient: See Health Risk Assessment completed  with patient prior to the visit either above or in recent phone note. This was reviewed in detailed with the patient today and appropriate recommendations, orders and referrals were placed as needed per Summary below and patient instructions.   Review of Medical History: -PMH, PSH, Family History and current specialty and care providers reviewed and updated and listed below   Patient Care Team: Nelwyn Salisbury, MD as PCP - General (Family Medicine) Allena Earing, MD as Referring Physician (Neurology) Sherrill Raring, [facility] (Inactive) (Pharmacist)   Past Medical History:  Diagnosis Date   Anemia    GERD 12/30/2006   Headache(784.0)    Hypertension    Multiple sclerosis (HCC)    PARESTHESIA 12/30/2006   Seizures (HCC)    Stroke [facility])     Past Surgical History:  Procedure Laterality Date   ANAL INTRAEPITHELIAL NEOPLASIA EXCISION     BREAST BIOPSY     CESAREAN SECTION     EYE SURGERY Right    Myometomy     TONSILLECTOMY      Social History   Socioeconomic History   Marital status: Widowed    Spouse name: Chrissie Noa   Number of children: 0   Years of education: [employer] education level: Bachelor's degree (e.g., BA, AB, BS)  Occupational History    Employer: [employer]   Occupation: RESEARCH ANALYST    Employer: [employer]  Tobacco Use   Smoking status: Never    Passive exposure: Past   Smokeless tobacco: Never  Vaping Use   Vaping Use: Never used  Substance and Sexual Activity   Alcohol use: No   Drug use: No   Sexual activity: Not Currently  Other Topics Concern   Not on file  Social History Narrative   Pt lives at home family.   Caffeine Use: 2 sodas daily   Social Determinants of Health   Financial Resource Strain: Low Risk  (04/02/2022)   Overall Financial Resource Strain (CARDIA)    Difficulty of Paying Living Expenses: Not very hard  Food Insecurity: No Food Insecurity (09/28/2022)   Hunger Vital Sign    Worried About Running  Out of Food in the Last Year: Never true    Ran Out of Food in the Last Year: Never true  Transportation Needs: No Transportation Needs (01/20/2022)   PRAPARE - Administrator, Civil Service (Medical): No    Lack of Transportation (Non-Medical): No  Physical Activity: Insufficiently Active (01/20/2022)   Exercise Vital Sign    Days of Exercise per Week: 7 days    Minutes of Exercise per Session: 20 min  Stress: No Stress Concern Present (01/20/2022)   [employer] of Occupational Health - Occupational Stress Questionnaire    Feeling of Stress : Not at all  Social Connections: Moderately Isolated (12/12/2020)   Social Connection and Isolation Panel [NHANES]    Frequency of Communication with Friends and Family: More than three times a week    Frequency of Social Gatherings with Friends and Family: More than three times a week    Attends Religious Services: Never    Database administrator or Organizations: No    Attends Banker Meetings: Never  Marital Status: Married  Catering manager Violence: Not At Risk (12/12/2020)   Humiliation, Afraid, Rape, and Kick questionnaire    Fear of Current or Ex-Partner: No    Emotionally Abused: No    Physically Abused: No    Sexually Abused: No    Family History  Problem Relation Age of Onset   Dementia Mother    Hypertension Mother    Diabetes Mother    Stroke Father    Hypertension Father    Psychosis Sister    Hypertension Other    Diabetes Other    Dementia Other    Dementia Maternal Grandmother    Stroke Maternal Grandmother     Current Outpatient Medications on File Prior to Visit  Medication Sig Dispense Refill   acetaminophen (TYLENOL) 160 MG/5ML solution Take 320 mg by mouth 2 (two) times daily as needed for mild pain. Reported on 07/21/2015     cholecalciferol (VITAMIN D) 1000 units tablet Take 1,000 Units by mouth daily. OTC     citalopram (CELEXA) 20 MG tablet TAKE 1 TABLET BY MOUTH EVERY DAY 90 tablet  0   cyanocobalamin (,VITAMIN B-12,) 1000 MCG/ML injection Inject into the muscle.     diphenhydrAMINE (BENADRYL) 12.5 MG/5ML elixir Take 25 mg by mouth 4 (four) times daily as needed for allergies.     fluticasone (FLONASE) 50 MCG/ACT nasal spray Place into the nose.     furosemide (LASIX) 40 MG tablet Take 1 tablet (40 mg total) by mouth daily. 90 tablet 3   gabapentin (NEURONTIN) 300 MG capsule TAKE 1 CAPSULE BY MOUTH THREE TIMES A DAY 90 capsule 2   HYDROcodone bit-homatropine (HYCODAN) 5-1.5 MG/5ML syrup Take 5 mLs by mouth every 4 (four) hours as needed. 240 mL 0   hydrOXYzine (ATARAX) 25 MG tablet Take 1 tablet (25 mg total) by mouth 3 (three) times daily as needed for itching. 60 tablet 1   levETIRAcetam (KEPPRA) 500 MG tablet TAKE 1 TABLET BY MOUTH TWICE A DAY 180 tablet 1   levocetirizine (XYZAL) 5 MG tablet Take by mouth.     Oxcarbazepine (TRILEPTAL) 300 MG tablet TAKE 1 TABLET BY MOUTH TWICE A DAY 60 tablet 1   promethazine (PHENERGAN) 25 MG tablet Take 1 tablet (25 mg total) by mouth every 8 (eight) hours as needed for nausea or vomiting. 60 tablet 2   propranolol (INDERAL) 20 MG tablet TAKE 1 OR 2 TABLETS THE MORNINGS OF MEDICAL OR DENTAL PROCEDURES 180 tablet 0   tiZANidine (ZANAFLEX) 2 MG tablet TAKE 1 TABLET (2 MG TOTAL) BY MOUTH 2 (TWO) TIMES DAILY. SPOKE WITH OFFICE, TAB OK 180 tablet 3   No current facility-administered medications on file prior to visit.    Allergies  Allergen Reactions   Banana Swelling    Lips, tongue and face   Sulfa Antibiotics Diarrhea and Nausea And Vomiting    Syncope episode and was incoherent   Sulfacetamide Sodium Other (See Comments)    Syncope episode and was incoherent   Penicillins Diarrhea, Nausea And Vomiting and Other (See Comments)    Has patient had a PCN reaction causing immediate rash, facial/tongue/throat swelling, SOB or lightheadedness with hypotension: Yes Has patient had a PCN reaction causing severe rash involving mucus  membranes or skin necrosis: No Has patient had a PCN reaction that required hospitalization No Has patient had a PCN reaction occurring within the last 10 years: No If all of the above answers are "NO", then may proceed with Cephalosporin use.  Passed  out,        Physical Exam There were no vitals filed for this visit. Estimated body mass index is 44.46 kg/m as calculated from the following:   Height as of 03/17/22: 5\' 3"  (1.6 m).   Weight as of 08/05/22: 251 lb (113.9 kg).  EKG (optional): deferred due to virtual visit  GENERAL: alert, oriented, no acute distress detected, full vision exam deferred due to pandemic and/or virtual encounter  PSYCH/NEURO: pleasant and cooperative, no obvious depression or anxiety, speech and thought processing grossly intact, Cognitive function grossly intact  Flowsheet Row Office Visit from 08/05/2022 in [facility] [place]  PHQ-9 Total Score 7           11/10/2022    4:34 PM 08/05/2022    4:24 PM 01/20/2022   12:16 PM 12/12/2020    7:23 PM 11/22/2020    1:26 PM  Depression screen PHQ 2/9  Decreased Interest 0 1 0 0 0  Down, Depressed, Hopeless 0 1 0 1 0  PHQ - 2 Score 0 2 0 1 0  Altered sleeping  0     Tired, decreased energy  2     Change in appetite  0     Feeling bad or failure about yourself   1     Trouble concentrating  1     Moving slowly or fidgety/restless  1     Suicidal thoughts  0     PHQ-9 Score  7     Difficult doing work/chores  Somewhat difficult          02/14/2021    2:07 PM 07/11/2021    2:25 PM 11/03/2021    2:10 PM 01/20/2022   12:14 PM 08/05/2022    4:24 PM  Fall Risk  Falls in the past year? 1 1 1 1 1   Was there an injury with Fall? 0 0 0 1 1  Fall Risk Category Calculator 2 2 2 3 3   Fall Risk Category (Retired) Moderate Moderate Moderate High   (RETIRED) Patient Fall Risk Level High fall risk High fall risk High fall risk High fall risk   Patient at Risk for Falls Due to Impaired  balance/gait;Impaired mobility;History of fall(s) History of fall(s);Impaired balance/gait;Impaired mobility History of fall(s);Impaired balance/gait;Impaired mobility Impaired balance/gait;Impaired mobility;Medication side effect No Fall Risks  Fall risk Follow up Education provided;Falls prevention discussed Education provided;Falls prevention discussed Education provided;Falls prevention discussed Falls evaluation completed;Education provided;Falls prevention discussed Falls evaluation completed   Neurologist has sent her to PT for this. Has had PT in the past for this and it helped some. She walks with her walker or cane most of the time.   SUMMARY AND PLAN:  Encounter for Medicare annual wellness exam   Discussed applicable health maintenance/preventive health measures and advised and referred or ordered per patient preferences: -she plans to call the breast center to schedule her mammogram this year -she declines vaccines because her father got really sick with them  Health Maintenance  Topic Date Due   Hepatitis C Screening  Never done   MAMMOGRAM  08/07/2020   COVID-19 Vaccine (3 - Pfizer risk series) 11/26/2022 (Originally 11/17/2019)   Zoster Vaccines- Shingrix (1 of 2) 02/10/2023 (Originally [birth year]14/1987)   INFLUENZA VACCINE  12/17/2022   Medicare Annual Wellness (AWV)  11/10/2023   PAP SMEAR-Modifier  03/17/2024   DTaP/Tdap/Td (3 - Td or Tdap) 03/28/2025   Colonoscopy  11/29/2030   HIV Screening  Completed   HPV VACCINES  Aged [employer] and counseling on the following was provided based on the above review of health and a plan/checklist for the patient, along with additional information discussed, was provided for the patient in the patient instructions:  -Advised on importance of completing advanced directives, discussed options for completing and provided information in patient instructions as well -advised to see ENT if any concerns about her hearing - though sounds  like she can hear well -Provided counseling and plan for increased risk of falling if applicable per above screening. Reviewed and demonstrated safe balance exercises that can be done at home to improve balance and discussed exercise guidelines for adults with include balance exercises at least 3 days per week.  -Advised and counseled on a healthy lifestyle  -Reviewed patient's current diet. Advised and counseled on a whole foods based healthy diet. A summary of a healthy diet was provided in the Patient Instructions.  -reviewed patient's current physical activity level and discussed exercise guidelines for adults. Discussed community resources and ideas for safe exercise at home to assist in meeting exercise guideline recommendations in a safe and healthy way. [facility]. -Advise yearly dental visits at minimum and regular eye exams   Follow up: see patient instructions     Patient Instructions  I really enjoyed getting to talk with you today! I am available on Tuesdays and Thursdays for virtual visits if you have any questions or concerns, or if I can be of any further assistance.   CHECKLIST FROM ANNUAL WELLNESS VISIT:  -Follow up (please call to schedule if not scheduled after visit):   -yearly for annual wellness visit with primary care office  Here is a list of your preventive care/health maintenance measures and the plan for each if any are due:  PLAN For any measures below that may be due:  -please call the mammogram center to schedule your mammogram -can get hepatitis C screening when you see Dr. Clent Ridges if you wish -if you change your mind about the shingles or covid vaccines, you can get them at the pharmacy  Health Maintenance  Topic Date Due   Hepatitis C Screening  Never done   MAMMOGRAM  08/07/2020   COVID-19 Vaccine (3 - Pfizer risk series) 11/26/2022 (Originally 11/17/2019)   Zoster Vaccines- Shingrix (1 of 2) 02/10/2023  (Originally [birth year]/14/1987)   INFLUENZA VACCINE  12/17/2022   Medicare Annual Wellness (AWV)  11/10/2023   PAP SMEAR-Modifier  03/17/2024   DTaP/Tdap/Td (3 - Td or Tdap) 03/28/2025   Colonoscopy  11/29/2030   HIV Screening  Completed   HPV VACCINES  Aged Out    -See a dentist at least yearly  -Get your eyes checked and then per your eye specialist's recommendations  -Other issues addressed today:   -I have included below further information regarding a healthy whole foods based diet, physical activity guidelines for adults, stress management and opportunities for social connections. I hope you find this information useful.   -----------------------------------------------------------------------------------------------------------------------------------------------------------------------------------------------------------------------------------------------------------  NUTRITION: -eat real food: lots of colorful vegetables (half the plate) and fruits -5-7 servings of vegetables and fruits per day (fresh or steamed is best), exp. 2 servings of vegetables with lunch and dinner and 2 servings of fruit per day. Berries and greens such as kale and collards are great choices.  -consume on a regular basis: whole grains (make sure first ingredient on label contains the word "whole"), fresh fruits, fish, nuts, seeds, healthy  oils (such as olive oil, avocado oil, grape seed oil) -may eat small amounts of dairy and lean meat on occasion, but avoid processed meats such as ham, bacon, lunch meat, etc. -drink water -try to avoid fast food and pre-packaged foods, processed meat -most experts advise limiting sodium to < 2300mg  per day, should limit further is any chronic conditions such as high blood pressure, heart disease, diabetes, etc. The American Heart Association advised that < 1500mg  is is ideal -try to avoid foods that contain any ingredients with names you do not recognize  -try to avoid  sugar/sweets (except for the natural sugar that occurs in fresh fruit) -try to avoid sweet drinks -try to avoid white rice, white bread, pasta (unless whole grain), white or yellow potatoes  EXERCISE GUIDELINES FOR ADULTS: -if you wish to increase your physical activity, do so gradually and with the approval of your doctor -STOP and seek medical care immediately if you have any chest pain, chest discomfort or trouble breathing when starting or increasing exercise  -move and stretch your body, legs, feet and arms when sitting for long periods -Physical activity guidelines for optimal health in adults: -least 150 minutes per week of aerobic exercise (can talk, but not sing) once approved by your doctor, 20-30 minutes of sustained activity or two 10 minute episodes of sustained activity every day.  -resistance training at least 2 days per week if approved by your doctor -balance exercises 3+ days per week:   Stand somewhere where you have something sturdy to hold onto if you lose balance.    1) lift up on toes, start with 5x per day and work up to 20x   2) stand and lift on leg straight out to the side so that foot is a few inches of the floor, start with 5x each side and work up to 20x each side   3) stand on one foot, start with 5 seconds each side and work up to 20 seconds on each side  If you need ideas or help with getting more active:  -Silver sneakers https://tools.silversneakers.com  -Walk with a Doc: http://www.duncan-williams.com/  -try to include resistance (weight lifting/strength building) and balance exercises twice per week: or the following link for ideas: http://castillo-powell.com/  BuyDucts.dk  STRESS MANAGEMENT: -can try meditating, or just sitting quietly with deep breathing while intentionally relaxing all parts of your body for 5 minutes daily -if you need further help with stress,  anxiety or depression please follow up with your primary doctor or contact the wonderful folks at [facility]: [phone]  SOCIAL CONNECTIONS: -options in [place] if you wish to engage in more social and exercise related activities:  -Silver sneakers https://tools.silversneakers.com  -Walk with a Doc: http://www.duncan-williams.com/  -Check out the [facility] Active Adults 50+ section on the [place] [employer] (hiking clubs, book clubs, cards and games, chess, exercise classes, aquatic classes and much more) - see the website for details: https://www.[place].gov/departments/parks-recreation/active-adults50  -YouTube has lots of exercise videos for different ages and abilities as well  -[facility] (a variety of indoor and outdoor inperson activities for adults). [phone]. [address]  -Virtual Online Classes (a variety of topics): see seniorplanet.org or call [phone]  -consider volunteering at a school, hospice center, church, senior center or elsewhere         ADVANCED HEALTHCARE DIRECTIVES:  Everyone should have advanced health care directives in place. This is so that you get the care you want, should you ever be in  a situation where you are unable to make your own medical decisions.   From the [employer] Advanced Directive Website: "Advance Health Care Directives are legal documents in which you give written instructions about your health care if, in the future, you cannot speak for yourself.   A health care power of attorney allows you to name a person you trust to make your health care decisions if you cannot make them yourself. A declaration of a desire for a natural death (or living will) is document, which states that you desire not to have your life prolonged by extraordinary measures if you have a terminal or incurable illness or if you are in a vegetative state. An advance instruction for mental health treatment  makes a declaration of instructions, information and preferences regarding your mental health treatment. It also states that you are aware that the advance instruction authorizes a mental health treatment provider to act according to your wishes. It may also outline your consent or refusal of mental health treatment. A declaration of an anatomical gift allows anyone over the age of 56 to make a gift by will, organ donor card or other document."   Please see the following website or an elder law attorney for forms, FAQs and for completion of advanced directives: [place] TEFL teacher Health Care Directives Advance Health Care Directives (http://guzman.com/)  Or copy and paste the following to your web browser: PoshChat.fi    Terressa Koyanagi, DO

## 2022-11-14 ENCOUNTER — Other Ambulatory Visit: Payer: Self-pay

## 2022-11-14 ENCOUNTER — Emergency Department (HOSPITAL_COMMUNITY): Payer: PPO

## 2022-11-14 ENCOUNTER — Emergency Department (HOSPITAL_COMMUNITY)
Admission: EM | Admit: 2022-11-14 | Discharge: 2022-11-14 | Disposition: A | Discharge status: Home / Self Care | Payer: PPO | Attending: Emergency Medicine | Admitting: Emergency Medicine

## 2022-11-14 DIAGNOSIS — Z79899 Other long term (current) drug therapy: Secondary | ICD-10-CM | POA: Diagnosis not present

## 2022-11-14 DIAGNOSIS — R569 Unspecified convulsions: Secondary | ICD-10-CM | POA: Diagnosis not present

## 2022-11-14 DIAGNOSIS — I10 Essential (primary) hypertension: Secondary | ICD-10-CM | POA: Insufficient documentation

## 2022-11-14 LAB — CBC WITH DIFFERENTIAL/PLATELET
Abs Immature Granulocytes: 0.03 10*3/uL (ref 0.00–0.07)
Basophils Absolute: 0 10*3/uL (ref 0.0–0.1)
Basophils Relative: 1 %
Eosinophils Absolute: 0.2 10*3/uL (ref 0.0–0.5)
Eosinophils Relative: 4 %
HCT: 43.1 % (ref 36.0–46.0)
Hemoglobin: 13.4 g/dL (ref 12.0–15.0)
Immature Granulocytes: 1 %
Lymphocytes Relative: 25 %
Lymphs Abs: 1.4 10*3/uL (ref 0.7–4.0)
MCH: 25.9 pg — ABNORMAL LOW (ref 26.0–34.0)
MCHC: 31.1 g/dL (ref 30.0–36.0)
MCV: 83.2 fL (ref 80.0–100.0)
Monocytes Absolute: 0.3 10*3/uL (ref 0.1–1.0)
Monocytes Relative: 6 %
Neutro Abs: 3.6 10*3/uL (ref 1.7–7.7)
Neutrophils Relative %: 63 %
Platelets: 264 10*3/uL (ref 150–400)
RBC: 5.18 MIL/uL — ABNORMAL HIGH (ref 3.87–5.11)
RDW: 15.1 % (ref 11.5–15.5)
WBC: 5.6 10*3/uL (ref 4.0–10.5)
nRBC: 0 % (ref 0.0–0.2)

## 2022-11-14 LAB — COMPREHENSIVE METABOLIC PANEL
ALT: 18 U/L (ref 0–44)
AST: 20 U/L (ref 15–41)
Albumin: 4.6 g/dL (ref 3.5–5.0)
Alkaline Phosphatase: 115 U/L (ref 38–126)
Anion gap: 9 (ref 5–15)
BUN: 13 mg/dL (ref 6–20)
CO2: 26 mmol/L (ref 22–32)
Calcium: 9.2 mg/dL (ref 8.9–10.3)
Chloride: 104 mmol/L (ref 98–111)
Creatinine, Ser: 0.94 mg/dL (ref 0.44–1.00)
GFR, Estimated: >60 mL/min (ref >60)
Glucose, Bld: 90 mg/dL (ref 70–99)
Potassium: 3.8 mmol/L (ref 3.5–5.1)
Sodium: 139 mmol/L (ref 135–145)
Total Bilirubin: 0.5 mg/dL (ref 0.3–1.2)
Total Protein: 8.6 g/dL — ABNORMAL HIGH (ref 6.5–8.1)

## 2022-11-14 LAB — HCG, SERUM, QUALITATIVE: Preg, Serum: NEGATIVE

## 2022-11-14 LAB — MAGNESIUM: Magnesium: 2.4 mg/dL (ref 1.7–2.4)

## 2022-11-14 LAB — CBG MONITORING, ED: Glucose-Capillary: 91 mg/dL (ref 70–99)

## 2022-11-14 MED ORDER — LEVETIRACETAM IN NACL 1000 MG/100ML IV SOLN
1000.0000 mg | Freq: Once | INTRAVENOUS | Status: AC
  Administered 2022-11-14: 1000 mg via INTRAVENOUS
  Filled 2022-11-14: qty 100

## 2022-11-14 MED ORDER — SODIUM CHLORIDE 0.9 % IV BOLUS
1000.0000 mL | Freq: Once | INTRAVENOUS | Status: AC
  Administered 2022-11-14: 1000 mL via INTRAVENOUS

## 2022-11-14 MED ORDER — PROCHLORPERAZINE EDISYLATE 10 MG/2ML IJ SOLN
10.0000 mg | Freq: Once | INTRAMUSCULAR | Status: AC
  Administered 2022-11-14: 10 mg via INTRAVENOUS
  Filled 2022-11-14: qty 2

## 2022-11-14 MED ORDER — KETOROLAC TROMETHAMINE 15 MG/ML IJ SOLN
15.0000 mg | Freq: Once | INTRAMUSCULAR | Status: AC
  Administered 2022-11-14: 15 mg via INTRAVENOUS
  Filled 2022-11-14: qty 1

## 2022-11-14 NOTE — ED Provider Notes (Signed)
[place] EMERGENCY DEPARTMENT AT [facility] Provider Note   CSN: [id number] Arrival date & time: 11/14/22  1016     History  Chief Complaint  Patient presents with   Seizures    [name] is a 57 y.o. female.  57 year old female with a past medical history significant for MS and nonepileptic episodes who presents today for concern of seizure-like activity.  Since then she reports left-sided numbness.  No weakness.  No speech change or facial droop.  Sister is at bedside.  Patient reports compliance with her Keppra and Trileptal.  No recent missed doses.  No recent illness.  States her last episode was about a month ago.  She does not always come in after her seizures states they resolve on their own she does not have any ongoing issues.  Sister states today was different as there was no shaking episode with the initial 1 however the second event did have seizure-like activity.  This occurred in the presence of paramedics.  She also endorses a headache.  Does have history of headaches.  Denies history of migraines.  The numbness started following the episode which was just a couple hours prior to arriving to the emergency department.  The history is provided by the patient. No language interpreter was used.       Home Medications Prior to Admission medications   Medication Sig Start Date End Date Taking? Authorizing Provider  acetaminophen (TYLENOL) 160 MG/5ML solution Take 320 mg by mouth 2 (two) times daily as needed for mild pain. Reported on 07/21/2015    [provider]  cholecalciferol (VITAMIN D) 1000 units tablet Take 1,000 Units by mouth daily. OTC    [provider]  citalopram (CELEXA) 20 MG tablet TAKE 1 TABLET BY MOUTH EVERY DAY 09/21/22   Nelwyn Salisbury, MD  cyanocobalamin (,VITAMIN B-12,) 1000 MCG/ML injection Inject into the muscle.    [provider]  diphenhydrAMINE (BENADRYL) 12.5 MG/5ML elixir Take 25 mg by mouth  4 (four) times daily as needed for allergies.    [provider]  fluticasone (FLONASE) 50 MCG/ACT nasal spray Place into the nose.    [provider]  furosemide (LASIX) 40 MG tablet Take 1 tablet (40 mg total) by mouth daily. 05/27/21   Nelwyn Salisbury, MD  gabapentin (NEURONTIN) 300 MG capsule TAKE 1 CAPSULE BY MOUTH THREE TIMES A DAY 06/26/22   Nelwyn Salisbury, MD  HYDROcodone bit-homatropine (HYCODAN) 5-1.5 MG/5ML syrup Take 5 mLs by mouth every 4 (four) hours as needed. 07/17/22   Nelwyn Salisbury, MD  hydrOXYzine (ATARAX) 25 MG tablet Take 1 tablet (25 mg total) by mouth 3 (three) times daily as needed for itching. 07/10/21   Nelwyn Salisbury, MD  levETIRAcetam (KEPPRA) 500 MG tablet TAKE 1 TABLET BY MOUTH TWICE A DAY 10/13/22   Nelwyn Salisbury, MD  levocetirizine (XYZAL) 5 MG tablet Take by mouth.    [provider]  Oxcarbazepine (TRILEPTAL) 300 MG tablet TAKE 1 TABLET BY MOUTH TWICE A DAY 09/24/22   Nelwyn Salisbury, MD  promethazine (PHENERGAN) 25 MG tablet Take 1 tablet (25 mg total) by mouth every 8 (eight) hours as needed for nausea or vomiting. 12/19/21   Nelwyn Salisbury, MD  propranolol (INDERAL) 20 MG tablet TAKE 1 OR 2 TABLETS THE MORNINGS OF MEDICAL OR DENTAL PROCEDURES 11/07/19   Nelwyn Salisbury, MD  tiZANidine (ZANAFLEX) 2 MG tablet TAKE 1 TABLET (2 MG TOTAL) BY  MOUTH 2 (TWO) TIMES DAILY. SPOKE WITH OFFICE, TAB OK 08/26/22   Nelwyn Salisbury, MD      Allergies    Banana, Sulfa antibiotics, Sulfacetamide sodium, and Penicillins    Review of Systems   Review of Systems  Constitutional:  Negative for fever.  Neurological:  Positive for numbness and headaches. Negative for weakness.  All other systems reviewed and are negative.   Physical Exam Updated Vital Signs BP (!) 171/105   Pulse 75   Temp 98.4 F (36.9 C) (Oral)   Resp 12   Ht 5\' 3"  (1.6 m)   Wt 114.8 kg   SpO2 96%   BMI 44.82 kg/m  Physical Exam Vitals and nursing note reviewed.  Constitutional:       General: She is not in acute distress.    Appearance: Normal appearance. She is not ill-appearing.  HENT:     Head: Normocephalic and atraumatic.     Nose: Nose normal.  Eyes:     General: No scleral icterus.    Extraocular Movements: Extraocular movements intact.     Conjunctiva/sclera: Conjunctivae normal.     Pupils: Pupils are equal, round, and reactive to light.  Cardiovascular:     Rate and Rhythm: Normal rate and regular rhythm.     Heart sounds: Normal heart sounds.  Pulmonary:     Effort: Pulmonary effort is normal. No respiratory distress.     Breath sounds: Normal breath sounds. No wheezing or rales.  Abdominal:     General: There is no distension.     Palpations: Abdomen is soft.     Tenderness: There is no abdominal tenderness. There is no guarding.  Musculoskeletal:        General: Normal range of motion.     Cervical back: Normal range of motion.  Skin:    General: Skin is warm and dry.  Neurological:     General: No focal deficit present.     Mental Status: She is alert and oriented to person, place, and time. Mental status is at baseline.     Comments: Decree sensation on the left side.  Otherwise cranial nerves III through XII intact.  Full range of motion of bilateral upper and lower extremities with 5/5 strength.  Ambulates without difficulty.  No pronator drift.  No facial droop.  Normal speech.     ED Results / Procedures / Treatments   Labs (all labs ordered are listed, but only abnormal results are displayed) Labs Reviewed  CBC WITH DIFFERENTIAL/PLATELET - Abnormal; Notable for the following components:      Result Value   RBC 5.18 (*)    MCH 25.9 (*)    All other components within normal limits  COMPREHENSIVE METABOLIC PANEL - Abnormal; Notable for the following components:   Total Protein 8.6 (*)    All other components within normal limits  HCG, SERUM, QUALITATIVE  MAGNESIUM  LEVETIRACETAM LEVEL  CBG MONITORING, ED  CBG MONITORING, ED     EKG None  Radiology CT Head Wo Contrast  Result Date: 11/14/2022 CLINICAL DATA:  Seizure this morning. EXAM: CT HEAD WITHOUT CONTRAST TECHNIQUE: Contiguous axial images were obtained from the base of the skull through the vertex without intravenous contrast. RADIATION DOSE REDUCTION: This exam was performed according to the departmental dose-optimization program which includes automated exposure control, adjustment of the mA and/or kV according to patient size and/or use of iterative reconstruction technique. COMPARISON:  09/01/2020 FINDINGS: Brain: No evidence of intracranial hemorrhage, acute infarction,  hydrocephalus, extra-axial collection, or mass lesion/mass effect. Vascular:  No hyperdense vessel or other acute findings. Skull: No evidence of fracture or other significant bone abnormality. Sinuses/Orbits:  No acute findings. Other: None. IMPRESSION: Negative noncontrast head CT. Electronically Signed   By: Danae Orleans M.D.   On: 11/14/2022 14:08    Procedures Procedures    Medications Ordered in ED Medications  sodium chloride 0.9 % bolus 1,000 mL (1,000 mLs Intravenous New Bag/Given 11/14/22 1315)  prochlorperazine (COMPAZINE) injection 10 mg (10 mg Intravenous Given 11/14/22 1315)  ketorolac (TORADOL) 15 MG/ML injection 15 mg (15 mg Intravenous Given 11/14/22 1315)  levETIRAcetam (KEPPRA) IVPB 1000 mg/100 mL premix (1,000 mg Intravenous New Bag/Given 11/14/22 1648)    ED Course/ Medical Decision Making/ A&P                             Medical Decision Making Amount and/or Complexity of Data Reviewed Labs: ordered. Radiology: ordered.  Risk Prescription drug management.   Medical Decision Making / ED Course   This patient presents to the ED for concern of seizure-like activity, this involves an extensive number of treatment options, and is a complaint that carries with it a high risk of complications and morbidity.  The differential diagnosis includes seizure due to  noncompliance, breakthrough seizure  MDM: 57 year old female presents with seizure-like activity.  Reports compliance with her seizure medications.  Does endorse left-sided numbness.  Also has a headache.  Will give migraine cocktail and reevaluate.  Has history of MS.  Given symptoms were sudden onset soon after this episode low suspicion that this may be MS.  Will see if it improves with migraine cocktail.  Will give fluids.  1 g of Keppra load given.  Blood work obtained which is overall reassuring without acute concerns.  CBC without cytosis or anemia.  CMP with preserved renal function and normal electrolytes.  hCG negative.  CT head without acute intracranial finding.  EKG without acute ischemic changes.  On reevaluation patient with improved and resolution of symptoms.  Discussed with attending.  Agreement with plan.  Patient discharged in stable condition.   Lab Tests: -I ordered, reviewed, and interpreted labs.   The pertinent results include:   Labs Reviewed  CBC WITH DIFFERENTIAL/PLATELET - Abnormal; Notable for the following components:      Result Value   RBC 5.18 (*)    MCH 25.9 (*)    All other components within normal limits  COMPREHENSIVE METABOLIC PANEL - Abnormal; Notable for the following components:   Total Protein 8.6 (*)    All other components within normal limits  HCG, SERUM, QUALITATIVE  MAGNESIUM  LEVETIRACETAM LEVEL  CBG MONITORING, ED  CBG MONITORING, ED      EKG  EKG Interpretation Date/Time:    Ventricular Rate:    PR Interval:    QRS Duration:    QT Interval:    QTC Calculation:   R Axis:      Text Interpretation:           Imaging Studies ordered: I ordered imaging studies including ct head I independently visualized and interpreted imaging. I agree with the radiologist interpretation   Medicines ordered and prescription drug management: Meds ordered this encounter  Medications   sodium chloride 0.9 % bolus 1,000 mL    prochlorperazine (COMPAZINE) injection 10 mg   ketorolac (TORADOL) 15 MG/ML injection 15 mg   levETIRAcetam (KEPPRA) IVPB 1000 mg/100 mL premix    -  I have reviewed the patients home medicines and have made adjustments as needed   Reevaluation: After the interventions noted above, I reevaluated the patient and found that they have :improved  Co morbidities that complicate the patient evaluation  Past Medical History:  Diagnosis Date   Anemia    GERD 12/30/2006   Headache(784.0)    Hypertension    Multiple sclerosis (HCC)    PARESTHESIA 12/30/2006   Seizures (HCC)    Stroke [facility])       Dispostion: Patient discharged in stable condition.  Return precaution discussed.  Patient voices understanding and is in agreement with the plan.  Final Clinical Impression(s) / ED Diagnoses Final diagnoses:  Seizure-like activity [facility])    Rx / DC Orders ED Discharge Orders     None         Marita Kansas, PA-C 11/14/22 1748    Derwood Kaplan, MD 11/15/22 [phone]

## 2022-11-14 NOTE — ED Notes (Signed)
Lav, lt greed, and dark green also sent to lab

## 2022-11-14 NOTE — ED Triage Notes (Signed)
Pt BIBA from home. Pt reports having a seizure this morning, states sister was there to witness it. Pt takes Keppra and has not missed any doses. C/o fatigue post seizure. No c/o head pain.

## 2022-11-14 NOTE — Discharge Instructions (Signed)
Your exam today is reassuring.  CT scan did not show anything concerning.  Blood work was reassuring.  You received a dose of Keppra and IV fluids.  Please follow-up with your neurologist.  For any concerning symptoms return to the emergency room.

## 2022-11-16 LAB — LEVETIRACETAM LEVEL: Levetiracetam Lvl: 7.1 ug/mL — ABNORMAL LOW (ref 10.0–40.0)

## 2022-12-27 ENCOUNTER — Other Ambulatory Visit: Payer: Self-pay | Admitting: Family Medicine

## 2023-01-27 ENCOUNTER — Other Ambulatory Visit: Payer: Self-pay | Admitting: Family Medicine

## 2023-03-24 DIAGNOSIS — H40013 Open angle with borderline findings, low risk, bilateral: Secondary | ICD-10-CM | POA: Diagnosis not present

## 2023-03-28 ENCOUNTER — Other Ambulatory Visit: Payer: Self-pay | Admitting: Family Medicine

## 2023-03-30 NOTE — Telephone Encounter (Signed)
Medication: Celexa 20 mg  Directions: Take 1 tablet by mouth daily  Last given: 12/28/22 Number refills: 0 Last o/v: 06/25/247 Follow up: 04/26/23 w/ pharmacist  Labs:  08/05/22

## 2023-04-14 ENCOUNTER — Inpatient Hospital Stay: Payer: PPO | Admitting: Family Medicine

## 2023-04-18 ENCOUNTER — Other Ambulatory Visit: Payer: Self-pay | Admitting: Family Medicine

## 2023-04-19 ENCOUNTER — Ambulatory Visit (INDEPENDENT_AMBULATORY_CARE_PROVIDER_SITE_OTHER): Payer: PPO | Admitting: Family Medicine

## 2023-04-19 ENCOUNTER — Encounter: Payer: Self-pay | Admitting: Family Medicine

## 2023-04-19 VITALS — BP 122/72 | HR 82 | Temp 98.3°F | Ht 63.0 in | Wt 236.7 lb

## 2023-04-19 DIAGNOSIS — R059 Cough, unspecified: Secondary | ICD-10-CM

## 2023-04-19 LAB — POC COVID19 BINAXNOW: SARS Coronavirus 2 Ag: NEGATIVE

## 2023-04-19 MED ORDER — AZITHROMYCIN 250 MG PO TABS: ORAL_TABLET | ORAL | 0 refills | Status: AC

## 2023-04-19 MED ORDER — HYDROCODONE BIT-HOMATROP MBR 5-1.5 MG/5ML PO SOLN: 5.0000 mL | Freq: Three times a day (TID) | ORAL | 0 refills | Status: DC | PRN

## 2023-04-19 NOTE — Progress Notes (Signed)
Established Patient Office Visit  Subjective   Patient ID: [name], female    DOB: November 08, 1965  Age: 57 y.o. MRN: [id number]  Chief Complaint  Patient presents with   Cough    Patient complains of productive cough with yellow-greenish sputum, x5 days, Tried Dayquil    HPI    [name] is seen with productive cough which has been progressive over several days.  She has had some dark yellow to green sputum.  She has tried DayQuil without much improvement.  Non-smoker.  She does have MS and states that she is "immunocompromised".  She has had some cough productive of bloody sputum at times.  No recent appetite changes or weight loss.  Not aware of any fever.  No pleuritic pain or chest pain.  Allergy to sulfa and penicillin.  She states cough is severe at night and interfering with sleep.  She has taken Hycodan in the past and requesting refill.  Past Medical History:  Diagnosis Date   Anemia    GERD 12/30/2006   Headache(784.0)    Hypertension    Multiple sclerosis (HCC)    PARESTHESIA 12/30/2006   Seizures (HCC)    Stroke [facility])    Past Surgical History:  Procedure Laterality Date   ANAL INTRAEPITHELIAL NEOPLASIA EXCISION     BREAST BIOPSY     CESAREAN SECTION     EYE SURGERY Right    Myometomy     TONSILLECTOMY      reports that she has never smoked. She has been exposed to tobacco smoke. She has never used smokeless tobacco. She reports that she does not drink alcohol and does not use drugs. family history includes Dementia in her maternal grandmother, mother, and another family member; Diabetes in her mother and another family member; Hypertension in her father, mother, and another family member; Psychosis in her sister; Stroke in her father and maternal grandmother. Allergies  Allergen Reactions   Banana Swelling    Lips, tongue and face   Sulfa Antibiotics Diarrhea and Nausea And Vomiting    Syncope episode and was incoherent   Sulfacetamide Sodium  Other (See Comments)    Syncope episode and was incoherent   Penicillins Diarrhea, Nausea And Vomiting and Other (See Comments)    Has patient had a PCN reaction causing immediate rash, facial/tongue/throat swelling, SOB or lightheadedness with hypotension: Yes Has patient had a PCN reaction causing severe rash involving mucus membranes or skin necrosis: No Has patient had a PCN reaction that required hospitalization No Has patient had a PCN reaction occurring within the last 10 years: No If all of the above answers are "NO", then may proceed with Cephalosporin use.  Passed out,     Review of Systems  Constitutional:  Negative for chills and fever.  Respiratory:  Positive for cough and sputum production. Negative for wheezing.   Cardiovascular:  Negative for chest pain.  Gastrointestinal:  Negative for nausea and vomiting.      Objective:     BP 122/72 (BP Location: Left Arm, Patient Position: Sitting, Cuff Size: Normal)   Pulse 82   Temp 98.3 F (36.8 C) (Oral)   Ht 5\' 3"  (1.6 m)   Wt 236 lb 11.2 oz (107.4 kg)   SpO2 98%   BMI 41.93 kg/m  BP Readings from Last 3 Encounters:  04/19/23 122/72  11/14/22 (!) 171/105  08/05/22 120/84   Wt Readings from Last 3 Encounters:  04/19/23 236 lb 11.2 oz (107.4 kg)  11/14/22  253 lb (114.8 kg)  08/05/22 251 lb (113.9 kg)      Physical Exam Vitals reviewed.  Constitutional:      General: She is not in acute distress.    Appearance: She is not ill-appearing.  Cardiovascular:     Rate and Rhythm: Normal rate and regular rhythm.  Pulmonary:     Effort: Pulmonary effort is normal.     Breath sounds: Normal breath sounds. No wheezing or rales.  Musculoskeletal:     Cervical back: Neck supple.  Lymphadenopathy:     Cervical: No cervical adenopathy.  Neurological:     Mental Status: She is alert.      Results for orders placed or performed in visit on 04/19/23  POC COVID-19  Result Value Ref Range   SARS Coronavirus 2 Ag  Negative Negative      The ASCVD Risk score (Arnett DK, et al., 2019) failed to calculate for the following reasons:   The patient has a prior MI or stroke diagnosis    Assessment & Plan:   Problem List Items Addressed This Visit   None Visit Diagnoses     Cough, unspecified type    -  Primary   Relevant Orders   POC COVID-19 (Completed)     57 year old female with history of MS who presents with progressive productive cough over several days.  Covid negative.  No respiratory distress.  Pulse oximetry 98% room air.  No fever.  She is concerned because of her immunocompromise history.  Having severe cough at night.  -Wrote for limited Hycodan cough syrup 1 teaspoon nightly which she has used previously in the past with good success -Plenty of fluids and good hydration.  Continue over-the-counter Mucinex twice daily -Zithromax for 5 days. -follow up for any persistent or worsening symptoms.   No follow-ups on file.    Evelena Peat, MD

## 2023-04-26 ENCOUNTER — Other Ambulatory Visit: Payer: PPO

## 2023-04-26 NOTE — Progress Notes (Signed)
04/26/2023 Name: [name] MRN: [id number] DOB: 02-14-1966  Chief Complaint  Patient presents with   Hypertension   Medication Management    [name] is a 57 y.o. year old female who presented for a telephone visit.   They were referred to the pharmacist by their PCP for assistance in managing hypertension.    Subjective:  Care Team: Primary Care Provider: Nelwyn Salisbury, MD   Medication Access/Adherence  Current Pharmacy:  [facility] [phone] [place], [place] - [address] [place] [address] Phone: [phone] Fax: [phone]  [id number] [place],  - [address] AT [facility] [address] [place] [address] Phone: [phone] Fax: [phone]   Patient reports affordability concerns with their medications: No  Patient reports access/transportation concerns to their pharmacy: No  Patient reports adherence concerns with their medications:  No     Hypertension:  Current medications: None - not taking her lasix for sometime now Medications previously tried: lasix, losartan/hydrochlorothiazide, lisinopril  Patient has a validated, automated, upper arm home BP cuff Current blood pressure readings readings: not checking  Patient denies hypotensive s/sx including dizziness, lightheadedness.  Patient denies hypertensive symptoms including headache, chest pain, shortness of breath   Objective:  Lab Results  Component Value Date   HGBA1C 6.1 05/27/2021    Lab Results  Component Value Date   CREATININE 0.94 11/14/2022   BUN 13 11/14/2022   NA 139 11/14/2022   K 3.8 11/14/2022   CL 104 11/14/2022   CO2 26 11/14/2022    Lab Results  Component Value Date   CHOL 216 (H) 11/09/2016   HDL 63.00 11/09/2016   LDLCALC 137 (H) 11/09/2016   TRIG 79.0 11/09/2016   CHOLHDL 3 11/09/2016    Medications Reviewed Today     Reviewed  by Sherrill Raring, RPH (Pharmacist) on 04/26/23 at 1313  Med List Status: <None>   Medication Order Taking? Sig Documenting Provider Last Dose Status Informant  acetaminophen (TYLENOL) 160 MG/5ML solution [id number] Yes Take 320 mg by mouth 2 (two) times daily as needed for mild pain. Reported on 07/21/2015 [provider] Taking Active Self  cholecalciferol (VITAMIN D) 1000 units tablet [id number] Yes Take 1,000 Units by mouth daily. OTC [provider] Taking Active   citalopram (CELEXA) 20 MG tablet [id number] Yes TAKE 1 TABLET BY MOUTH EVERY DAY Nelwyn Salisbury, MD Taking Active   cyanocobalamin (,VITAMIN B-12,) 1000 MCG/ML injection [id number] Yes Inject into the muscle. [provider] Taking Active   diphenhydrAMINE (BENADRYL) 12.5 MG/5ML elixir [id number] Yes Take 25 mg by mouth 4 (four) times daily as needed for allergies. [provider] Taking Active Self  fluticasone (FLONASE) 50 MCG/ACT nasal spray [id number] No Place into the nose.  Patient not taking: Reported on 04/26/2023   [provider] Not Taking Active   furosemide (LASIX) 40 MG tablet [id number] Yes Take 1 tablet (40 mg total) by mouth daily. Nelwyn Salisbury, MD Taking Active   gabapentin (NEURONTIN) 300 MG capsule [id number] Yes TAKE 1 CAPSULE BY MOUTH THREE TIMES A DAY Nelwyn Salisbury, MD Taking Active   HYDROcodone bit-homatropine (HYCODAN) 5-1.5 MG/5ML syrup [id number] Yes Take 5 mLs by mouth every 8 (eight) hours as needed for cough. Kristian Covey, MD Taking Active   hydrOXYzine (ATARAX) 25 MG tablet [id number] No Take 1 tablet (25 mg total) by mouth 3 (three) times daily as needed for itching.  Patient not taking: Reported on 04/26/2023   Nelwyn Salisbury, MD Not Taking Active   levETIRAcetam (KEPPRA) 500 MG tablet [id number] Yes TAKE 1 TABLET BY MOUTH TWICE A DAY Nelwyn Salisbury, MD Taking Active   levocetirizine (XYZAL) 5 MG tablet [id number] Yes Take by mouth. [provider]  Taking Active   Oxcarbazepine (TRILEPTAL) 300 MG tablet [id number] Yes TAKE 1 TABLET BY MOUTH TWICE A DAY Nelwyn Salisbury, MD Taking Active   promethazine (PHENERGAN) 25 MG tablet [id number] Yes Take 1 tablet (25 mg total) by mouth every 8 (eight) hours as needed for nausea or vomiting. Nelwyn Salisbury, MD Taking Active   propranolol (INDERAL) 20 MG tablet [id number] Yes TAKE 1 OR 2 TABLETS THE MORNINGS OF MEDICAL OR DENTAL PROCEDURES Nelwyn Salisbury, MD Taking Active   tiZANidine (ZANAFLEX) 2 MG tablet [id number] Yes TAKE 1 TABLET (2 MG TOTAL) BY MOUTH 2 (TWO) TIMES DAILY. SPOKE WITH OFFICE, TAB OK Nelwyn Salisbury, MD Taking Active               Assessment/Plan:   Hypertension: - Currently controlled - Reviewed long term cardiovascular and renal outcomes of uncontrolled blood pressure - Reviewed appropriate blood pressure monitoring technique and reviewed goal blood pressure. Recommended to check home blood pressure and heart rate once weekly - Recommend to continue current medication therapy     Follow Up Plan: 6 months  Sherrill Raring, PharmD Clinical Pharmacist [phone]

## 2023-05-07 ENCOUNTER — Ambulatory Visit: Payer: PPO | Admitting: Family Medicine

## 2023-05-14 ENCOUNTER — Other Ambulatory Visit: Payer: Self-pay | Admitting: Family Medicine

## 2023-05-14 ENCOUNTER — Encounter: Payer: Self-pay | Admitting: Family Medicine

## 2023-05-14 ENCOUNTER — Ambulatory Visit (INDEPENDENT_AMBULATORY_CARE_PROVIDER_SITE_OTHER): Payer: PPO | Admitting: Family Medicine

## 2023-05-14 VITALS — BP 100/78 | HR 82 | Wt 239.0 lb

## 2023-05-14 DIAGNOSIS — G8929 Other chronic pain: Secondary | ICD-10-CM

## 2023-05-14 DIAGNOSIS — M545 Low back pain, unspecified: Secondary | ICD-10-CM | POA: Insufficient documentation

## 2023-05-14 DIAGNOSIS — J3089 Other allergic rhinitis: Secondary | ICD-10-CM | POA: Insufficient documentation

## 2023-05-14 DIAGNOSIS — E538 Deficiency of other specified B group vitamins: Secondary | ICD-10-CM

## 2023-05-14 DIAGNOSIS — R7303 Prediabetes: Secondary | ICD-10-CM | POA: Diagnosis not present

## 2023-05-14 DIAGNOSIS — M5441 Lumbago with sciatica, right side: Secondary | ICD-10-CM

## 2023-05-14 LAB — POCT GLYCOSYLATED HEMOGLOBIN (HGB A1C): Hemoglobin A1C: 5.7 % — AB (ref 4.0–5.6)

## 2023-05-14 MED ORDER — METHYLPREDNISOLONE ACETATE 80 MG/ML IJ SUSP
80.0000 mg | Freq: Once | INTRAMUSCULAR | Status: AC
Start: 2023-05-14 — End: 2023-05-14
  Administered 2023-05-14: 80 mg via INTRAMUSCULAR

## 2023-05-14 MED ORDER — METHYLPREDNISOLONE ACETATE 40 MG/ML IJ SUSP
40.0000 mg | Freq: Once | INTRAMUSCULAR | Status: AC
Start: 2023-05-14 — End: 2023-05-14
  Administered 2023-05-14: 40 mg via INTRAMUSCULAR

## 2023-05-14 MED ORDER — CYANOCOBALAMIN 1000 MCG/ML IJ SOLN
1000.0000 ug | Freq: Once | INTRAMUSCULAR | Status: AC
Start: 2023-05-14 — End: 2023-05-14
  Administered 2023-05-14: 1000 ug via INTRAMUSCULAR

## 2023-05-14 NOTE — Addendum Note (Signed)
Addended by: Carola Rhine on: 05/14/2023 03:40 PM   Modules accepted: Orders

## 2023-05-14 NOTE — Progress Notes (Signed)
   Subjective:    Patient ID: [name], female    DOB: 1965-08-26, 57 y.o.   MRN: [id number]  HPI Here for several issues. She has degenerative discs in her lumbar spine, and this causes her to have flares of right sided low back pain and right buttock pain. She had an MRI in 2019 that also showed epidural lipomatosis that was putting pressure on the spinal chord. She sees Dr. Allena Earing at the [facility] for her multiple sclerosis, and in 2021 he referred her to Dr. Learta Codding, a physiatrist. She was planning to do an epidural steroid injection at that time, but this was cancelled because [name]'s BP was too high. She has had flares of this pain since then, and she has dealt with it the past 3 weeks. Second she asks [place] to check her A1c because last month she went several weeks where she was thirsty and she urinated more than usual. Now for the past 2 weeks she has greatly reduced her intake of sugars and carbs, and the thirst and polyuria has resolved. Third she says she has had a dry cough for the past 6 weeks. No fever or ST or SOB. She saw Dr. Caryl Never on 04-19-23 and her lungs were clear. He treated her with a Zpack, but this did not help much. This past week she realized that she has not been taking Xyzal, so she started back on this, and now the cough has improved.   Review of Systems  Constitutional: Negative.   HENT:  Positive for postnasal drip. Negative for congestion, ear pain, sinus pain and sore throat.   Respiratory:  Positive for cough.   Cardiovascular: Negative.   Musculoskeletal:  Positive for back pain.       Objective:   Physical Exam Constitutional:      Appearance: Normal appearance.  HENT:     Right Ear: Tympanic membrane, ear canal and external ear normal.     Left Ear: Tympanic membrane, ear canal and external ear normal.     Nose: Nose normal.     Mouth/Throat:     Pharynx: Oropharynx is clear.  Eyes:      Conjunctiva/sclera: Conjunctivae normal.  Pulmonary:     Effort: Pulmonary effort is normal.     Breath sounds: Normal breath sounds.  Musculoskeletal:     Comments: She is tender over the right lower back and the right sciatic notch   Lymphadenopathy:     Cervical: No cervical adenopathy.  Neurological:     Mental Status: She is alert.           Assessment & Plan:  For the sciatica, we will give her a shot of DepoMedrol. We will also refer her back to Dr. Rico Sheehan at [facility] to consider an ESI. Her A1c today is 5.7%, so I advised her to stick with her current diet of reduced sugars and carbs. Her cough seems to be allergic in nature, so I advised her to continue taking Xyzal. The DepoMedrol shot will help this problems as well. Kathleen Crane, MD

## 2023-07-03 ENCOUNTER — Encounter (HOSPITAL_COMMUNITY): Payer: Self-pay

## 2023-07-03 ENCOUNTER — Ambulatory Visit (HOSPITAL_COMMUNITY)
Admission: EM | Admit: 2023-07-03 | Discharge: 2023-07-03 | Disposition: A | Discharge status: Home / Self Care | Payer: PPO | Attending: Emergency Medicine | Admitting: Emergency Medicine

## 2023-07-03 DIAGNOSIS — Z23 Encounter for immunization: Secondary | ICD-10-CM | POA: Diagnosis not present

## 2023-07-03 DIAGNOSIS — S30851A Superficial foreign body of abdominal wall, initial encounter: Secondary | ICD-10-CM

## 2023-07-03 MED ORDER — TETANUS-DIPHTH-ACELL PERTUSSIS 5-2.5-18.5 LF-MCG/0.5 IM SUSY
PREFILLED_SYRINGE | INTRAMUSCULAR | Status: AC
  Filled 2023-07-03: qty 0.5

## 2023-07-03 MED ORDER — DOXYCYCLINE HYCLATE 100 MG PO CAPS: 100.0000 mg | ORAL_CAPSULE | Freq: Two times a day (BID) | ORAL | 0 refills | Status: DC

## 2023-07-03 MED ORDER — TETANUS-DIPHTH-ACELL PERTUSSIS 5-2.5-18.5 LF-MCG/0.5 IM SUSY
0.5000 mL | PREFILLED_SYRINGE | Freq: Once | INTRAMUSCULAR | Status: AC
  Administered 2023-07-03: 0.5 mL via INTRAMUSCULAR

## 2023-07-03 NOTE — Discharge Instructions (Addendum)
We gave you a Tdap booster today. Start taking doxycyline twice daily for 10 days for wound infection coverage. I recommend applying warm compresses to the area to promote any drainage and help with swelling. Return here if you notice spreading redness/swelling, pus drainage, or develop a fever.

## 2023-07-03 NOTE — ED Provider Notes (Signed)
[facility]    CSN: [id number] Arrival date & time: 07/03/23  1143      History   Chief Complaint Chief Complaint  Patient presents with   Abscess    HPI [name] is a 58 y.o. female.   Patient presented with foreign body to right side of abdomen.  Patient reports noticing something that was sticking out the side of her abdomen this morning.   Abscess   Past Medical History:  Diagnosis Date   Anemia    GERD 12/30/2006   Headache(784.0)    Hypertension    Multiple sclerosis (HCC)    PARESTHESIA 12/30/2006   Seizures (HCC)    Stroke [facility])     Patient Active Problem List   Diagnosis Date Noted   Low back pain 05/14/2023   Prediabetes 05/14/2023   Environmental and seasonal allergies 05/14/2023   COVID-19 virus infection 01/07/2021   Essential hypertension 05/30/2015   Right-sided chest pain 06/27/2014   Microcytic anemia 06/27/2014   Multiple sclerosis exacerbation (HCC) 06/27/2014   Obesity (BMI 30-39.9) 06/27/2014   B12 deficiency 06/06/2013   Multiple sclerosis (HCC) 08/12/2010   GERD 12/30/2006   PARESTHESIA 12/30/2006    Past Surgical History:  Procedure Laterality Date   ANAL INTRAEPITHELIAL NEOPLASIA EXCISION     BREAST BIOPSY     CESAREAN SECTION     EYE SURGERY Right    Myometomy     TONSILLECTOMY      OB History   No obstetric history on file.      Home Medications    Prior to Admission medications   Medication Sig Start Date End Date Taking? Authorizing Provider  doxycycline (VIBRAMYCIN) 100 MG capsule Take 1 capsule (100 mg total) by mouth 2 (two) times daily. 07/03/23  Yes Wynonia Lawman A, NP  acetaminophen (TYLENOL) 160 MG/5ML solution Take 320 mg by mouth 2 (two) times daily as needed for mild pain. Reported on 07/21/2015    [provider]  cholecalciferol (VITAMIN D) 1000 units tablet Take 1,000 Units by mouth daily. OTC    [provider]  citalopram (CELEXA) 20 MG tablet TAKE 1  TABLET BY MOUTH EVERY DAY 03/30/23   Nelwyn Salisbury, MD  cyanocobalamin (,VITAMIN B-12,) 1000 MCG/ML injection Inject into the muscle.    [provider]  diphenhydrAMINE (BENADRYL) 12.5 MG/5ML elixir Take 25 mg by mouth 4 (four) times daily as needed for allergies.    [provider]  fluticasone (FLONASE) 50 MCG/ACT nasal spray Place into the nose.    [provider]  furosemide (LASIX) 40 MG tablet Take 1 tablet (40 mg total) by mouth daily. 05/27/21   Nelwyn Salisbury, MD  gabapentin (NEURONTIN) 300 MG capsule TAKE 1 CAPSULE BY MOUTH THREE TIMES A DAY 04/20/23   Nelwyn Salisbury, MD  hydrOXYzine (ATARAX) 25 MG tablet Take 1 tablet (25 mg total) by mouth 3 (three) times daily as needed for itching. 07/10/21   Nelwyn Salisbury, MD  levETIRAcetam (KEPPRA) 500 MG tablet TAKE 1 TABLET BY MOUTH TWICE A DAY 10/13/22   Nelwyn Salisbury, MD  levocetirizine (XYZAL) 5 MG tablet Take by mouth.    [provider]  Oxcarbazepine (TRILEPTAL) 300 MG tablet TAKE 1 TABLET BY MOUTH TWICE A DAY 05/17/23   Nelwyn Salisbury, MD  promethazine (PHENERGAN) 25 MG tablet Take 1 tablet (25 mg total) by mouth every 8 (eight) hours as needed for nausea or vomiting. 12/19/21   Gershon Crane  A, MD  propranolol (INDERAL) 20 MG tablet TAKE 1 OR 2 TABLETS THE MORNINGS OF MEDICAL OR DENTAL PROCEDURES 11/07/19   Nelwyn Salisbury, MD  tiZANidine (ZANAFLEX) 2 MG tablet TAKE 1 TABLET (2 MG TOTAL) BY MOUTH 2 (TWO) TIMES DAILY. SPOKE WITH OFFICE, TAB OK 08/26/22   Nelwyn Salisbury, MD    Family History Family History  Problem Relation Age of Onset   Dementia Mother    Hypertension Mother    Diabetes Mother    Stroke Father    Hypertension Father    Psychosis Sister    Hypertension Other    Diabetes Other    Dementia Other    Dementia Maternal Grandmother    Stroke Maternal Grandmother     Social History Social History   Tobacco Use   Smoking status: Never    Passive exposure: Past   Smokeless tobacco:  Never  Vaping Use   Vaping status: Never Used  Substance Use Topics   Alcohol use: No   Drug use: No     Allergies   Banana, Sulfa antibiotics, Sulfacetamide sodium, and Penicillins   Review of Systems Review of Systems  Per HPI  Physical Exam Triage Vital Signs ED Triage Vitals  Encounter Vitals Group     BP 07/03/23 1244 (!) 136/92     Systolic BP Percentile --      Diastolic BP Percentile --      Pulse Rate 07/03/23 1244 72     Resp 07/03/23 1244 16     Temp 07/03/23 1244 99.2 F (37.3 C)     Temp Source 07/03/23 1244 Oral     SpO2 07/03/23 1244 94 %     Weight 07/03/23 1243 240 lb (108.9 kg)     Height 07/03/23 1243 5\' 3"  (1.6 m)     Head Circumference --      Peak Flow --      Pain Score 07/03/23 1243 1     Pain Loc --      Pain Education --      Exclude from Growth Chart --    No data found.  Updated Vital Signs BP (!) 136/92 (BP Location: Right Arm)   Pulse 72   Temp 99.2 F (37.3 C) (Oral)   Resp 16   Ht 5\' 3"  (1.6 m)   Wt 240 lb (108.9 kg)   LMP  (LMP Unknown)   SpO2 94%   BMI 42.51 kg/m   Visual Acuity Right Eye Distance:   Left Eye Distance:   Bilateral Distance:    Right Eye Near:   Left Eye Near:    Bilateral Near:     Physical Exam Vitals and nursing note reviewed.  Constitutional:      General: She is not in acute distress.    Appearance: Normal appearance. She is not ill-appearing.  Abdominal:     Comments: Small silver-colored sharp object protruding out of right side of abdomen.   Neurological:     Mental Status: She is alert.      UC Treatments / Results  Labs (all labs ordered are listed, but only abnormal results are displayed) Labs Reviewed - No data to display  EKG   Radiology No results found.  Procedures Procedures (including critical care time)  Medications Ordered in UC Medications  Tdap (BOOSTRIX) injection 0.5 mL (0.5 mLs Intramuscular Given 07/03/23 1316)    Initial Impression / Assessment and  Plan / UC Course  I have reviewed the  triage vital signs and the nursing notes.  Pertinent labs & imaging results that were available during my care of the patient were reviewed by me and considered in my medical decision making (see chart for details).     Patient presented with foreign body to right side of abdomen that she noticed this morning.  Upon assessment there is a small silver-colored sharp object protruding out of right side of abdomen.  Was able to extract 1 inch long pin with tweezers.  There is some surrounding tenderness and erythema noted.  Updated Tdap in clinic.  Prescribed doxycycline for infection coverage.  Discussed return precautions. Final Clinical Impressions(s) / UC Diagnoses   Final diagnoses:  Foreign body of abdominal wall, initial encounter     Discharge Instructions      We gave you a Tdap booster today. Start taking doxycyline twice daily for 10 days for wound infection coverage. I recommend applying warm compresses to the area to promote any drainage and help with swelling. Return here if you notice spreading redness/swelling, pus drainage, or develop a fever.     ED Prescriptions     Medication Sig Dispense Auth. Provider   doxycycline (VIBRAMYCIN) 100 MG capsule Take 1 capsule (100 mg total) by mouth 2 (two) times daily. 20 capsule Wynonia Lawman A, NP      PDMP not reviewed this encounter.   Wynonia Lawman A, NP 07/03/23 [phone]

## 2023-07-03 NOTE — ED Triage Notes (Signed)
Patient here today with c/o right side torso skin infection that she noticed this morning.

## 2023-09-02 DIAGNOSIS — G35 Multiple sclerosis: Secondary | ICD-10-CM | POA: Diagnosis not present

## 2023-09-17 ENCOUNTER — Ambulatory Visit (INDEPENDENT_AMBULATORY_CARE_PROVIDER_SITE_OTHER)

## 2023-09-17 ENCOUNTER — Ambulatory Visit

## 2023-09-17 DIAGNOSIS — E538 Deficiency of other specified B group vitamins: Secondary | ICD-10-CM | POA: Diagnosis not present

## 2023-09-17 MED ORDER — CYANOCOBALAMIN 1000 MCG/ML IJ SOLN
1000.0000 ug | Freq: Once | INTRAMUSCULAR | Status: AC
Start: 2023-09-17 — End: 2023-09-17
  Administered 2023-09-17: 1000 ug via INTRAMUSCULAR

## 2023-09-17 NOTE — Progress Notes (Signed)
 Patient is in office today for a nurse visit for B12 Injection. Patient Injection was given in the  Right deltoid. Patient tolerated injection well.

## 2023-09-28 ENCOUNTER — Other Ambulatory Visit: Payer: Self-pay | Admitting: Family Medicine

## 2023-10-18 ENCOUNTER — Other Ambulatory Visit: Payer: Self-pay | Admitting: Family Medicine

## 2023-12-14 ENCOUNTER — Encounter: Admitting: Family Medicine

## 2023-12-14 NOTE — Progress Notes (Signed)
 error

## 2023-12-14 NOTE — Patient Instructions (Signed)
 [name]

## 2024-02-23 ENCOUNTER — Ambulatory Visit: Payer: Self-pay

## 2024-02-23 NOTE — Telephone Encounter (Signed)
 FYI Only or Action Required?: Action required by provider: request for appointment and refused most triage questions. Demands work in visit with pcp 02/24/24  Patient was last seen in primary care on 12/14/2023 by Luke Chiquita SAUNDERS, DO.  Called Nurse Triage reporting Back Pain.  Symptoms began limited triage.  Interventions attempted: Other: limited triage.  Symptoms are: limited triage.  Triage Disposition: See PCP Within 2 Weeks  Patient/caregiver understands and will follow disposition?: Unsure    Copied from CRM #[id number]. Topic: Clinical - Red Word Triage >> Feb 23, 2024  2:21 PM [place] A wrote: [facility] that prompted transfer to Nurse Triage: Patient has been having pain in her back for past few days. Reason for Disposition  Back pain is a chronic symptom (recurrent or ongoing AND present > 4 weeks)  Additional Information  Commented on: Numbness in groin or rectal area (i.e., loss of sensation)    Refused most questions Just my back is hurting  Answer Assessment - Initial Assessment Questions Additional info: Limited triage, refusing most questions. Ma'am I have MS and back is flared I just need to see Dr. Johnny Tomorrow This writer reviewed available appointments and next with PCP is on Monday Oct 13, patient declines offer for acute visit with another provider, insists on work in appointment with pcp 02/24/24 anytime.     1. ONSET: When did the pain begin? (e.g., minutes, hours, days)     Few days  2. LOCATION: Where does it hurt? (upper, mid or lower back)     All over  3. SEVERITY: How bad is the pain?  (e.g., Scale 1-10; mild, moderate, or severe)     refused 4. PATTERN: Is the pain constant? (e.g., yes, no; constant, intermittent)      refused 5. RADIATION: Does the pain shoot into your legs or somewhere else?     refused 6. CAUSE:  What do you think is causing the back pain?      ms 7. BACK OVERUSE:  Any recent lifting of heavy objects, strenuous  work or exercise?     Refused most triage questions 8. MEDICINES: What have you taken so far for the pain? (e.g., nothing, acetaminophen , NSAIDS)     Did not disclose 9. NEUROLOGIC SYMPTOMS: Do you have any weakness, numbness, or problems with bowel/bladder control?     just my back is hurting and I need to see Dr. Johnny tomorrow  10. OTHER SYMPTOMS: Do you have any other symptoms? (e.g., fever, abdomen pain, burning with urination, blood in urine)       Refused 11. PREGNANCY: Is there any chance you are pregnant? When was your last menstrual period?  Protocols used: Back Pain-A-AH

## 2024-02-24 NOTE — Telephone Encounter (Signed)
 Spoke with pt declined to see any other Dr at the office scheduled appointment with Dr Johnny for 02/28/24. Advised pt to seek medical help if her symptoms change

## 2024-02-28 ENCOUNTER — Encounter: Payer: Self-pay | Admitting: Family Medicine

## 2024-02-28 ENCOUNTER — Ambulatory Visit (INDEPENDENT_AMBULATORY_CARE_PROVIDER_SITE_OTHER): Admitting: Family Medicine

## 2024-02-28 VITALS — BP 124/80 | HR 82 | Temp 98.2°F | Wt 242.0 lb

## 2024-02-28 DIAGNOSIS — G8929 Other chronic pain: Secondary | ICD-10-CM | POA: Diagnosis not present

## 2024-02-28 DIAGNOSIS — M5441 Lumbago with sciatica, right side: Secondary | ICD-10-CM | POA: Diagnosis not present

## 2024-02-28 DIAGNOSIS — M5442 Lumbago with sciatica, left side: Secondary | ICD-10-CM

## 2024-02-28 MED ORDER — METHYLPREDNISOLONE ACETATE 80 MG/ML IJ SUSP
80.0000 mg | Freq: Once | INTRAMUSCULAR | Status: AC
Start: 2024-02-28 — End: 2024-02-28
  Administered 2024-02-28: 80 mg via INTRAMUSCULAR

## 2024-02-28 MED ORDER — PREGABALIN 75 MG PO CAPS: 75.0000 mg | ORAL_CAPSULE | Freq: Three times a day (TID) | ORAL | 2 refills | Status: AC

## 2024-02-28 NOTE — Addendum Note (Signed)
 Addended by: LADONNA INOCENTE SAILOR on: 02/28/2024 03:22 PM   Modules accepted: Orders

## 2024-02-28 NOTE — Progress Notes (Signed)
   Subjective:    Patient ID: [name], female    DOB: [birth year], 58 y.o.   MRN: [id number]  HPI Here for continuing struggles with low back pain. This now radiates down the backs of both legs. She has tried Gabapentin  but this did not help her much. She asks to try something else.    Review of Systems  Constitutional: Negative.   Respiratory: Negative.    Cardiovascular: Negative.   Musculoskeletal:  Positive for back pain.       Objective:   Physical Exam Constitutional:      Appearance: She is obese.     Comments: Walks with a cane   Cardiovascular:     Rate and Rhythm: Normal rate and regular rhythm.     Pulses: Normal pulses.     Heart sounds: Normal heart sounds.  Pulmonary:     Effort: Pulmonary effort is normal.     Breath sounds: Normal breath sounds.  Neurological:     Mental Status: She is alert.           Assessment & Plan:  Low back pain with sciatica. She is given a shot of DepMedrol. We will also stop Gabapentin  and try Pregabalin 75 mg TID. Report back in 3-4 weeks. Garnette Olmsted, MD

## 2024-03-03 DIAGNOSIS — R5383 Other fatigue: Secondary | ICD-10-CM | POA: Diagnosis not present

## 2024-03-03 DIAGNOSIS — G35D Multiple sclerosis, unspecified: Secondary | ICD-10-CM | POA: Diagnosis not present

## 2024-03-03 DIAGNOSIS — E538 Deficiency of other specified B group vitamins: Secondary | ICD-10-CM | POA: Diagnosis not present

## 2024-03-03 DIAGNOSIS — R638 Other symptoms and signs concerning food and fluid intake: Secondary | ICD-10-CM | POA: Diagnosis not present

## 2024-03-03 DIAGNOSIS — E559 Vitamin D deficiency, unspecified: Secondary | ICD-10-CM | POA: Diagnosis not present

## 2024-03-03 DIAGNOSIS — Z7409 Other reduced mobility: Secondary | ICD-10-CM | POA: Diagnosis not present

## 2024-03-20 ENCOUNTER — Telehealth: Payer: Self-pay | Admitting: Family Medicine

## 2024-03-20 DIAGNOSIS — H40013 Open angle with borderline findings, low risk, bilateral: Secondary | ICD-10-CM | POA: Diagnosis not present

## 2024-03-20 NOTE — Telephone Encounter (Unsigned)
 Copied from CRM [id number]. Topic: Clinical - Refused Triage >> Mar 20, 2024  4:04 PM Alexandria E wrote: Patient/caller voiced complaints of numbness in tongue and fatigue. Patient stated her B12 and blood work has been out of conservator, museum/gallery and is requesting to speak only with Inocente. Declined transfer to triage.

## 2024-03-21 NOTE — Telephone Encounter (Signed)
 Spoke with [name] stated that she has been feeling bad, stated that she had Vit B12 and Vit D labs and they both came back low, [name] has appointment scheduled with Dr Johnny on 03/22/24

## 2024-03-22 ENCOUNTER — Ambulatory Visit (INDEPENDENT_AMBULATORY_CARE_PROVIDER_SITE_OTHER): Admitting: Family Medicine

## 2024-03-22 ENCOUNTER — Telehealth: Payer: Self-pay

## 2024-03-22 ENCOUNTER — Encounter: Payer: Self-pay | Admitting: Family Medicine

## 2024-03-22 ENCOUNTER — Other Ambulatory Visit: Payer: Self-pay

## 2024-03-22 VITALS — BP 118/80 | HR 63 | Temp 98.4°F | Wt 243.0 lb

## 2024-03-22 DIAGNOSIS — B351 Tinea unguium: Secondary | ICD-10-CM | POA: Diagnosis not present

## 2024-03-22 DIAGNOSIS — E538 Deficiency of other specified B group vitamins: Secondary | ICD-10-CM

## 2024-03-22 DIAGNOSIS — E559 Vitamin D deficiency, unspecified: Secondary | ICD-10-CM

## 2024-03-22 DIAGNOSIS — R209 Unspecified disturbances of skin sensation: Secondary | ICD-10-CM

## 2024-03-22 MED ORDER — CYANOCOBALAMIN 1000 MCG/ML IJ SOLN
1000.0000 ug | Freq: Once | INTRAMUSCULAR | Status: AC
  Administered 2024-03-22: 1000 ug via INTRAMUSCULAR

## 2024-03-22 MED ORDER — TERBINAFINE HCL 250 MG PO TABS: 250.0000 mg | ORAL_TABLET | Freq: Every day | ORAL | 1 refills | Status: AC

## 2024-03-22 MED ORDER — VITAMIN D (ERGOCALCIFEROL) 1.25 MG (50000 UNIT) PO CAPS: 50000.0000 [IU] | ORAL_CAPSULE | ORAL | Status: AC

## 2024-03-22 NOTE — Telephone Encounter (Signed)
 Pt seen by Dr Johnny for this afternoon for this problem

## 2024-03-22 NOTE — Addendum Note (Signed)
 Addended by: LADONNA INOCENTE SAILOR on: 03/22/2024 01:56 PM   Modules accepted: Orders

## 2024-03-22 NOTE — Telephone Encounter (Signed)
 Copied from CRM [id number]. Topic: Clinical - Red Word Triage >> Mar 21, 2024  8:47 AM Larissa RAMAN wrote: [facility] that prompted transfer to Nurse Triage: PT states she is having numbness in tongue, hands, and feet. Requesting a callback from Nancy. Patient refused NT.

## 2024-03-22 NOTE — Progress Notes (Signed)
   Subjective:    Patient ID: [name], female    DOB: 1966-01-24, 58 y.o.   MRN: [id number]  HPI Here for several issues. First she had labs drawn at [facility] on 03-03-24 because she was complaining of fatigue and numbness in her tongue. Her vitamin B12 level was low at 175, and the vitamin d  level was low at 16. The neurologist started her on weekly vitamin D  capsules, and they told her to see us  for the low B12. She had been getting B12 shots here monthly until this May, when she stopped taking them. She said the last shot gave her some arm pain, so she never scheduled another one. Also she has dealt with crusty dark toenails for the past year.    Review of Systems  Constitutional:  Positive for fatigue.  Respiratory: Negative.    Cardiovascular: Negative.   Neurological:  Positive for numbness.       Objective:   Physical Exam Constitutional:      Appearance: She is not ill-appearing.  Cardiovascular:     Rate and Rhythm: Normal rate and regular rhythm.     Pulses: Normal pulses.     Heart sounds: Normal heart sounds.  Pulmonary:     Effort: Pulmonary effort is normal.     Breath sounds: Normal breath sounds.  Skin:    Comments: Both great toenails are dark brown and crusty   Neurological:     Mental Status: She is alert.           Assessment & Plan:  She will keep taking the weekly vitamin D  pills as above. She will also start back on B12 shots today. We will give these weekly for 12 weeks and then check a level. For the toenail fungus, she will take Terbinafine 250 mg daily.  Garnette Olmsted, MD

## 2024-03-30 ENCOUNTER — Ambulatory Visit (INDEPENDENT_AMBULATORY_CARE_PROVIDER_SITE_OTHER)

## 2024-03-30 DIAGNOSIS — E538 Deficiency of other specified B group vitamins: Secondary | ICD-10-CM

## 2024-03-30 MED ORDER — CYANOCOBALAMIN 1000 MCG/ML IJ SOLN
1000.0000 ug | Freq: Once | INTRAMUSCULAR | Status: AC
  Administered 2024-03-30: 1000 ug via INTRAMUSCULAR

## 2024-03-30 NOTE — Progress Notes (Signed)
 Pt here for monthly B12 injection per Dr Johnny  B12 1000mcg given IM and pt tolerated injection well.  Next B12 injection scheduled for 04/04/24

## 2024-04-04 ENCOUNTER — Ambulatory Visit: Admitting: Family Medicine

## 2024-04-04 ENCOUNTER — Ambulatory Visit (INDEPENDENT_AMBULATORY_CARE_PROVIDER_SITE_OTHER)

## 2024-04-04 ENCOUNTER — Encounter

## 2024-04-04 DIAGNOSIS — E538 Deficiency of other specified B group vitamins: Secondary | ICD-10-CM | POA: Diagnosis not present

## 2024-04-04 MED ORDER — CYANOCOBALAMIN 1000 MCG/ML IJ SOLN
1000.0000 ug | Freq: Once | INTRAMUSCULAR | Status: AC
  Administered 2024-04-04: 1000 ug via INTRAMUSCULAR

## 2024-04-04 NOTE — Progress Notes (Signed)
  This encounter was created in error - please disregard. I CALLED PT TO SEE IF SHE COULD CONNECT TO [employer]UAL AND SHE STATED THAT SHE COULD NOT CONNECT due to her being in her car.  Pt had just left [place] office due to B12 appt.  I changed visit to Televisit and started with visit, however, patient stated that I would have to call her back to get visit done and also asked me why so many questions are being asked.  She stated that some of the questions are too personal.   She stated that she could not do this visit now (she said that I would have to call her back, however she did not say when to call back). I informed her to call office back when she get a chance to reschedule AWV.-BT

## 2024-04-04 NOTE — Progress Notes (Signed)
 Pt here for monthly B12 injection per Dr Johnny  B12 1000mcg given IM and pt tolerated injection well.  Next B12 injection scheduled for 04/12/24

## 2024-04-12 ENCOUNTER — Ambulatory Visit: Admitting: Family Medicine

## 2024-04-12 ENCOUNTER — Ambulatory Visit

## 2024-04-12 DIAGNOSIS — E538 Deficiency of other specified B group vitamins: Secondary | ICD-10-CM | POA: Diagnosis not present

## 2024-04-12 MED ORDER — CYANOCOBALAMIN 1000 MCG/ML IJ SOLN
1000.0000 ug | Freq: Once | INTRAMUSCULAR | Status: AC
  Administered 2024-04-12: 1000 ug via INTRAMUSCULAR

## 2024-04-12 NOTE — Progress Notes (Signed)
 Patient is in office today for a nurse visit for B12 Injection. Patient Injection was given in the  Left deltoid. Patient tolerated injection well.

## 2024-04-17 ENCOUNTER — Ambulatory Visit: Payer: Self-pay

## 2024-04-17 ENCOUNTER — Ambulatory Visit: Admitting: Family Medicine

## 2024-04-17 VITALS — BP 138/96 | HR 79 | Temp 98.9°F | Ht 63.0 in | Wt 244.7 lb

## 2024-04-17 DIAGNOSIS — J029 Acute pharyngitis, unspecified: Secondary | ICD-10-CM

## 2024-04-17 DIAGNOSIS — E538 Deficiency of other specified B group vitamins: Secondary | ICD-10-CM | POA: Diagnosis not present

## 2024-04-17 DIAGNOSIS — R0981 Nasal congestion: Secondary | ICD-10-CM

## 2024-04-17 DIAGNOSIS — R059 Cough, unspecified: Secondary | ICD-10-CM

## 2024-04-17 DIAGNOSIS — R058 Other specified cough: Secondary | ICD-10-CM | POA: Diagnosis not present

## 2024-04-17 LAB — POC COVID19 BINAXNOW: SARS Coronavirus 2 Ag: NEGATIVE

## 2024-04-17 LAB — POCT INFLUENZA A/B
Influenza A, POC: NEGATIVE
Influenza B, POC: NEGATIVE

## 2024-04-17 MED ORDER — DOXYCYCLINE HYCLATE 100 MG PO TABS: 100.0000 mg | ORAL_TABLET | Freq: Two times a day (BID) | ORAL | 0 refills | Status: DC

## 2024-04-17 MED ORDER — HYDROCODONE BIT-HOMATROP MBR 5-1.5 MG/5ML PO SOLN: 5.0000 mL | Freq: Four times a day (QID) | ORAL | 0 refills | Status: AC | PRN

## 2024-04-17 MED ORDER — CYANOCOBALAMIN 1000 MCG/ML IJ SOLN
1000.0000 ug | Freq: Once | INTRAMUSCULAR | Status: AC
  Administered 2024-04-17: 1000 ug via INTRAMUSCULAR

## 2024-04-17 NOTE — Telephone Encounter (Signed)
 Noted

## 2024-04-17 NOTE — Telephone Encounter (Signed)
 Copied from CRM #[id number]. Topic: Clinical - Red Word Triage >> Apr 17, 2024  8:04 AM Larissa RAMAN wrote: [facility] that prompted transfer to Nurse Triage: Cough w/ phlegm, chest pain

## 2024-04-17 NOTE — Telephone Encounter (Signed)
 FYI Only or Action Required?: FYI only for provider: appointment scheduled on 04/17/24.  Patient was last seen in primary care on 03/22/2024 by Johnny Garnette LABOR, MD.  Called Nurse Triage reporting Cough.  Symptoms began several days ago.  Interventions attempted: Rest, hydration, or home remedies.  Symptoms are: gradually worsening.  Triage Disposition: See Physician Within 24 Hours  Patient/caregiver understands and will follow disposition?: Yes         Reason for Disposition  [1] Continuous (nonstop) coughing interferes with work or school AND [2] no improvement using cough treatment per Care Advice  Answer Assessment - Initial Assessment Questions 1. ONSET: When did the cough begin?      Thursday 2. SEVERITY: How bad is the cough today?      Endorses hx of MS, so cough is :getting bad 3. SPUTUM: Describe the color of your sputum (e.g., none, dry cough; clear, white, yellow, green)     yellow 4. HEMOPTYSIS: Are you coughing up any blood? If Yes, ask: How much? (e.g., flecks, streaks, tablespoons, etc.)     Denies. Endorses bloody nose 5. DIFFICULTY BREATHING: Are you having difficulty breathing? If Yes, ask: How bad is it? (e.g., mild, moderate, severe)      denies 6. FEVER: Do you have a fever? If Yes, ask: What is your temperature, how was it measured, and when did it start?     denies 7. CARDIAC HISTORY: Do you have any history of heart disease? (e.g., heart attack, congestive heart failure)      denies 8. LUNG HISTORY: Do you have any history of lung disease?  (e.g., pulmonary embolus, asthma, emphysema)     denies 9. PE RISK FACTORS: Do you have a history of blood clots? (or: recent major surgery, recent prolonged travel, bedridden)     *No Answer* 10. OTHER SYMPTOMS: Do you have any other symptoms? (e.g., runny nose, wheezing, chest pain)       *No Answer* 11. PREGNANCY: Is there any chance you are pregnant? When was your last  menstrual period?       N/a 12. TRAVEL: Have you traveled out of the country in the last month? (e.g., travel history, exposures)       N/a  Protocols used: Cough - Acute Productive-A-AH

## 2024-04-17 NOTE — Progress Notes (Signed)
 Established Patient Office Visit  Subjective   Patient ID: [name], female    DOB: Jul 11, 1965  Age: 58 y.o. MRN: [id number]  Chief Complaint  Patient presents with   Cough    Pt c/o sore throat, headache, chest pain, fatigue, bodyache, throwing up mucus, bloody nose. Sx started after thanksgiving 11/28. Taking DayQuil, cough, no helps, ginger tea. Pt declined strep test.    Medical Management of Chronic Issues    Pt would like to get b12 injection.     HPI   [name] has chronic problems including hypertension, MS, B12 deficiency, prediabetes.  She is seen with respiratory symptoms which started right after Thanksgiving.  She states that she was around a sister and niece who are both sick during Thanksgiving.  She has had some severe sore throat, productive cough, worsening nasal congestion and some purulent nasal discharge.  She is concerned because of her MS history and states she has had difficulty shaking respiratory things in the past.  Currently not taking any immunosuppressive medications.  Denies any documented fever.  She is allergic to sulfa and penicillin.  She has taken doxycycline  without difficulty in the past.  She has taken over-the-counter DayQuil without relief.  She does have B12 deficiency history and requesting B12 injection today and apparently is due at this time.  Past Medical History:  Diagnosis Date   Anemia    GERD 12/30/2006   Headache(784.0)    Hypertension    Multiple sclerosis    PARESTHESIA 12/30/2006   Seizures (HCC)    Stroke [facility])    Past Surgical History:  Procedure Laterality Date   ANAL INTRAEPITHELIAL NEOPLASIA EXCISION     BREAST BIOPSY     CESAREAN SECTION     EYE SURGERY Right    Myometomy     TONSILLECTOMY      reports that she has never smoked. She has been exposed to tobacco smoke. She has never used smokeless tobacco. She reports that she does not drink alcohol and does not use drugs. family history includes  Dementia in her maternal grandmother, mother, and another family member; Diabetes in her mother and another family member; Hypertension in her father, mother, and another family member; Psychosis in her sister; Stroke in her father and maternal grandmother. Allergies  Allergen Reactions   Banana Swelling    Lips, tongue and face   Sulfa Antibiotics Diarrhea and Nausea And Vomiting    Syncope episode and was incoherent   Sulfacetamide Sodium Other (See Comments)    Syncope episode and was incoherent   Penicillins Diarrhea, Nausea And Vomiting and Other (See Comments)    Has patient had a PCN reaction causing immediate rash, facial/tongue/throat swelling, SOB or lightheadedness with hypotension: Yes Has patient had a PCN reaction causing severe rash involving mucus membranes or skin necrosis: No Has patient had a PCN reaction that required hospitalization No Has patient had a PCN reaction occurring within the last 10 years: No If all of the above answers are NO, then may proceed with Cephalosporin use.  Passed out,     Review of Systems  Constitutional:  Negative for chills and fever.  HENT:  Positive for sinus pain and sore throat.   Respiratory:  Positive for cough and sputum production. Negative for hemoptysis.       Objective:     BP (!) 138/96 (BP Location: Left Arm, Patient Position: Sitting, Cuff Size: Large)   Pulse 79   Temp 98.9 F (37.2 C) (  Oral)   Ht 5' 3 (1.6 m)   Wt 244 lb 11.2 oz (111 kg)   SpO2 98%   BMI 43.35 kg/m  BP Readings from Last 3 Encounters:  04/17/24 (!) 138/96  03/22/24 118/80  02/28/24 124/80   Wt Readings from Last 3 Encounters:  04/17/24 244 lb 11.2 oz (111 kg)  04/04/24 242 lb (109.8 kg)  03/22/24 243 lb (110.2 kg)      Physical Exam Vitals reviewed.  Constitutional:      Appearance: She is not ill-appearing.  HENT:     Right Ear: Tympanic membrane normal.     Left Ear: Tympanic membrane normal.     Mouth/Throat:     Pharynx:  Oropharynx is clear. No oropharyngeal exudate.  Cardiovascular:     Rate and Rhythm: Normal rate and regular rhythm.  Pulmonary:     Effort: Pulmonary effort is normal.     Breath sounds: Normal breath sounds. No wheezing or rales.  Musculoskeletal:     Cervical back: Neck supple.  Neurological:     Mental Status: She is alert.      No results found for any visits on 04/17/24.    The ASCVD Risk score (Arnett DK, et al., 2019) failed to calculate for the following reasons:   Risk score cannot be calculated because patient has a medical history suggesting prior/existing ASCVD    Assessment & Plan:   Upper respiratory infection with sore throat, productive cough, nasal congestion.  Influenza and COVID testing both negative.  She has had some complicated respiratory infections in the past.  We elected to go and cover with doxycycline  100 mg twice daily for 7 days.  Referral limited Hycodan cough syrup 1 teaspoon every 6 hours as needed for severe cough.  Follow-up promptly for any fever or worsening symptoms  Wolm Scarlet, MD

## 2024-04-20 ENCOUNTER — Ambulatory Visit: Payer: Self-pay

## 2024-04-20 ENCOUNTER — Telehealth: Payer: Self-pay | Admitting: Family Medicine

## 2024-04-20 NOTE — Telephone Encounter (Signed)
 Pt message has been sent to PCP for advise

## 2024-04-20 NOTE — Telephone Encounter (Signed)
 Per [facility] triage nurse pt decline to speak with her only want to speak with nancy having reaction to abx

## 2024-04-20 NOTE — Telephone Encounter (Signed)
 Patient refusing to speak with E2C2 RN and requesting call back from Garrett. CAL NORMA notified and she will reach out to [place] to alert her of pt call  Message from [place] S sent at 04/20/2024  2:10 PM EST  Reason for Triage: reaction to doxycycline  (VIBRA -TABS) 100 MG tablet- diarrhea, vomiting. Requested a callback from Nancy.

## 2024-04-21 ENCOUNTER — Other Ambulatory Visit: Payer: Self-pay | Admitting: Family Medicine

## 2024-04-21 MED ORDER — AZITHROMYCIN 250 MG PO TABS: ORAL_TABLET | ORAL | 0 refills | Status: AC

## 2024-04-21 NOTE — Progress Notes (Signed)
 Done

## 2024-04-21 NOTE — Telephone Encounter (Signed)
 Spoke to pt. Inform her of message below. Pt verbalized understanding and has no further questions.

## 2024-04-21 NOTE — Telephone Encounter (Signed)
 I added Doxycycline  to her allergy list,and I sent in a Zpack

## 2024-05-04 ENCOUNTER — Ambulatory Visit

## 2024-05-05 ENCOUNTER — Ambulatory Visit

## 2024-05-05 DIAGNOSIS — E538 Deficiency of other specified B group vitamins: Secondary | ICD-10-CM

## 2024-05-05 MED ORDER — CYANOCOBALAMIN 1000 MCG/ML IJ SOLN
1000.0000 ug | Freq: Once | INTRAMUSCULAR | Status: AC
  Administered 2024-05-05: 1000 ug via INTRAMUSCULAR

## 2024-05-05 NOTE — Progress Notes (Signed)
 Patient is in office today for a nurse visit for B12 Injection. Patient Injection was given in the  Left deltoid. Patient tolerated injection well.

## 2024-05-06 LAB — LAB REPORT - SCANNED: EGFR: 67

## 2024-05-09 ENCOUNTER — Telehealth: Payer: Self-pay | Admitting: *Deleted

## 2024-05-09 NOTE — Telephone Encounter (Signed)
 Copied from CRM #[id number]. Topic: Clinical - Medical Advice >> May 09, 2024  2:20 PM Viola F wrote: Reason for CRM: Patient was seen 05/06/2024 at [facility] - EMERGENCY DEPARTMENT for seizures. She was told to follow up with Neurologist but wants to know if she should follow up with Dr. Johnny? She said she's feeling okay today she's still having fatigue. She scheduled B12 injection for 05/10/24.

## 2024-05-09 NOTE — Telephone Encounter (Signed)
 She should follow up with Neurology for this

## 2024-05-09 NOTE — Telephone Encounter (Signed)
 Spoke with pt advised of Dr Johnny recommendation. Pt voiced understanding

## 2024-05-10 ENCOUNTER — Ambulatory Visit

## 2024-05-10 DIAGNOSIS — E538 Deficiency of other specified B group vitamins: Secondary | ICD-10-CM

## 2024-05-10 MED ORDER — CYANOCOBALAMIN 1000 MCG/ML IJ SOLN
1000.0000 ug | Freq: Once | INTRAMUSCULAR | Status: AC
  Administered 2024-05-10: 1000 ug via INTRAMUSCULAR

## 2024-05-10 NOTE — Progress Notes (Signed)
 Pt here for monthly B12 injection per Dr Johnny  B12 1000mcg given IM and pt tolerated injection well.  Next B12 injection scheduled for 05/16/24

## 2024-05-13 ENCOUNTER — Other Ambulatory Visit: Payer: Self-pay | Admitting: Family Medicine

## 2024-05-16 ENCOUNTER — Ambulatory Visit (INDEPENDENT_AMBULATORY_CARE_PROVIDER_SITE_OTHER)

## 2024-05-16 DIAGNOSIS — E538 Deficiency of other specified B group vitamins: Secondary | ICD-10-CM

## 2024-05-16 MED ORDER — CYANOCOBALAMIN 1000 MCG/ML IJ SOLN
1000.0000 ug | Freq: Once | INTRAMUSCULAR | Status: AC
  Administered 2024-05-16: 1000 ug via INTRAMUSCULAR

## 2024-05-16 NOTE — Progress Notes (Signed)
 Pt here for monthly B12 injection per Dr Johnny  B12 1000mcg given IM and pt tolerated injection well.  Next B12 injection scheduled for 05/23/24

## 2024-05-26 ENCOUNTER — Ambulatory Visit (INDEPENDENT_AMBULATORY_CARE_PROVIDER_SITE_OTHER)

## 2024-05-26 DIAGNOSIS — E538 Deficiency of other specified B group vitamins: Secondary | ICD-10-CM

## 2024-05-26 MED ORDER — CYANOCOBALAMIN 1000 MCG/ML IJ SOLN
1000.0000 ug | Freq: Once | INTRAMUSCULAR | Status: AC
  Administered 2024-05-26: 1000 ug via INTRAMUSCULAR

## 2024-05-26 NOTE — Progress Notes (Signed)
 Pt here for monthly B12 injection per Dr  Johnny  B12 1000mcg given IM and pt tolerated injection well.  Next B12 injection scheduled for 06/02/24

## 2024-05-29 ENCOUNTER — Ambulatory Visit: Payer: Self-pay

## 2024-05-29 NOTE — Telephone Encounter (Signed)
 Noted

## 2024-05-29 NOTE — Telephone Encounter (Signed)
 FYI Only or Action Required?: FYI only for provider: ED advised.  Patient was last seen in primary care on 04/17/2024 by Micheal Wolm ORN, MD.  Called Nurse Triage reporting Back Pain, Emesis, and Diarrhea.  Symptoms began yesterday.  Interventions attempted: Prescription medications: Acid reducing medication.  Symptoms are: gradually worsening.  Triage Disposition: Go to ED Now (or PCP Triage)  Patient/caregiver understands and will follow disposition?: Yes   Last night onset of 4/10 mid low back pain, nausea with yellow/clear emesis x5 times, and 10-15 episodes diarrhea.   Denies back injury or strain. No CP. No new weakness. No fever. No belly pain, just discomfort. Feels like a lot of gas is in her belly and feeling constipated despite many episodes of diarrhea. Burping. Has felt like passing out/unsteady/dizzy, did not pass out. Advised ED, pt will get someone to drive her. Advised 911 for worsening symptoms.   Copied from CRM #[id number]. Topic: Clinical - Red Word Triage >> May 29, 2024  1:28 PM Macario HERO wrote: Red Word that prompted transfer to Nurse Triage: Patient called stated she's not feeling well, back hurt, throwing up, a little constipation and diarrhea. Reason for Disposition  Patient sounds very sick or weak to the triager  Answer Assessment - Initial Assessment Questions 1. ONSET: When did the pain begin? (e.g., minutes, hours, days)     Last night  2. LOCATION: Where does it hurt? (upper, mid or lower back)     Mid low back  3. SEVERITY: How bad is the pain?  (e.g., Scale 1-10; mild, moderate, or severe)     4/10 dull pain, increases when trying to have a BM.  4. PATTERN: Is the pain constant? (e.g., yes, no; constant, intermittent)      Constant  5. RADIATION: Does the pain shoot into your legs or somewhere else?     Denies  6. CAUSE:  What do you think is causing the back pain?      Increased constipation lately  7. BACK OVERUSE:  Any  recent lifting of heavy objects, strenuous work or exercise?     Denies  8. MEDICINES: What have you taken so far for the pain? (e.g., nothing, acetaminophen , NSAIDS)     Acid reducing medication  9. NEUROLOGIC SYMPTOMS: Do you have any weakness, numbness, or problems with bowel/bladder control?     Denies  10. OTHER SYMPTOMS: Do you have any other symptoms? (e.g., fever, abdomen pain, burning with urination, blood in urine)       Abdominal discomfort (no pain), diarrhea x10-15 times, nausea with emesis x5 times since onset of symptoms last night. No blood.  Protocols used: Back Pain-A-AH

## 2024-05-30 ENCOUNTER — Ambulatory Visit (INDEPENDENT_AMBULATORY_CARE_PROVIDER_SITE_OTHER): Admitting: Family Medicine

## 2024-05-30 ENCOUNTER — Encounter: Payer: Self-pay | Admitting: Family Medicine

## 2024-05-30 VITALS — BP 132/86 | HR 98 | Temp 98.9°F | Wt 234.0 lb

## 2024-05-30 DIAGNOSIS — E538 Deficiency of other specified B group vitamins: Secondary | ICD-10-CM

## 2024-05-30 DIAGNOSIS — K5900 Constipation, unspecified: Secondary | ICD-10-CM | POA: Diagnosis not present

## 2024-05-30 DIAGNOSIS — A084 Viral intestinal infection, unspecified: Secondary | ICD-10-CM | POA: Diagnosis not present

## 2024-05-30 MED ORDER — CYANOCOBALAMIN 1000 MCG/ML IJ SOLN
1000.0000 ug | Freq: Once | INTRAMUSCULAR | Status: AC
  Administered 2024-05-30: 1000 ug via INTRAMUSCULAR

## 2024-05-30 NOTE — Progress Notes (Signed)
" ° °  Subjective:    Patient ID: [name], female    DOB: Oct 29, 1965, 59 y.o.   MRN: [id number]  HPI Here for several issues. First she developed nausea with vomiting and diarrhea 2 days ago about 5 hours after eating some fast food chicken wings. This eased up last night, and today she feels back to normal. She never had pain or fever. She does however complain of constant constipation. This has been going on for years. She drinks plenty of water and gets fiber in her diet, but she still has to take a laxative several times a week. She often feels bloated.    Review of Systems  Constitutional: Negative.   Respiratory: Negative.    Cardiovascular: Negative.   Gastrointestinal:  Positive for constipation. Negative for abdominal distention, abdominal pain, blood in stool, diarrhea, nausea, rectal pain and vomiting.  Genitourinary: Negative.        Objective:   Physical Exam Constitutional:      Appearance: Normal appearance.  Cardiovascular:     Rate and Rhythm: Normal rate and regular rhythm.     Pulses: Normal pulses.     Heart sounds: Normal heart sounds.  Pulmonary:     Effort: Pulmonary effort is normal.     Breath sounds: Normal breath sounds.  Abdominal:     General: Abdomen is flat. Bowel sounds are normal. There is no distension.     Palpations: Abdomen is soft. There is no mass.     Tenderness: There is no abdominal tenderness. There is no guarding or rebound.     Hernia: No hernia is present.  Neurological:     Mental Status: She is alert.           Assessment & Plan:  She has recovered from either a case of food poisoning or from a viral enteritis. As for the longstanding constipation, I advised her to use Miralax every day and to avoid laxatives if possible.  Garnette Olmsted, MD   "

## 2024-05-30 NOTE — Addendum Note (Signed)
 Addended by: LADONNA INOCENTE SAILOR on: 05/30/2024 10:27 AM   Modules accepted: Orders

## 2024-06-07 ENCOUNTER — Ambulatory Visit

## 2024-06-07 DIAGNOSIS — E538 Deficiency of other specified B group vitamins: Secondary | ICD-10-CM

## 2024-06-07 MED ORDER — CYANOCOBALAMIN 1000 MCG/ML IJ SOLN
1000.0000 ug | Freq: Once | INTRAMUSCULAR | Status: AC
  Administered 2024-06-07: 1000 ug via INTRAMUSCULAR

## 2024-06-07 NOTE — Progress Notes (Addendum)
 Pt here for monthly B12 injection per Dr Johnny  B12 1000mcg given IM and pt tolerated injection well.  Next B12 injection pt will call to scheduled

## 2024-06-13 ENCOUNTER — Inpatient Hospital Stay: Admitting: Family Medicine

## 2024-06-16 ENCOUNTER — Ambulatory Visit

## 2024-06-16 DIAGNOSIS — E538 Deficiency of other specified B group vitamins: Secondary | ICD-10-CM

## 2024-06-16 MED ORDER — CYANOCOBALAMIN 1000 MCG/ML IJ SOLN
1000.0000 ug | Freq: Once | INTRAMUSCULAR | Status: AC
  Administered 2024-06-16: 1000 ug via INTRAMUSCULAR

## 2024-06-16 NOTE — Progress Notes (Signed)
 Pt here for monthly B12 injection per Dr Johnny  B12 1000mcg given IM and pt tolerated injection well.  Next B12 injection scheduled for 06/20/24

## 2024-06-20 ENCOUNTER — Ambulatory Visit

## 2024-06-20 ENCOUNTER — Other Ambulatory Visit

## 2024-06-27 ENCOUNTER — Ambulatory Visit: Admitting: Family Medicine
